# Patient Record
Sex: Male | Born: 1950
Health system: Southern US, Community
[De-identification: ages and names within clinical notes are randomized; demographics above are authoritative.]

## PROBLEM LIST (undated history)

## (undated) DIAGNOSIS — K612 Anorectal abscess: Secondary | ICD-10-CM

## (undated) DIAGNOSIS — K603 Anal fistula: Secondary | ICD-10-CM

## (undated) DIAGNOSIS — I4891 Unspecified atrial fibrillation: Secondary | ICD-10-CM

## (undated) DIAGNOSIS — I10 Essential (primary) hypertension: Secondary | ICD-10-CM

## (undated) DIAGNOSIS — H401130 Primary open-angle glaucoma, bilateral, stage unspecified: Secondary | ICD-10-CM

## (undated) DIAGNOSIS — C61 Malignant neoplasm of prostate: Secondary | ICD-10-CM

## (undated) DIAGNOSIS — J9383 Other pneumothorax: Secondary | ICD-10-CM

## (undated) DIAGNOSIS — I639 Cerebral infarction, unspecified: Secondary | ICD-10-CM

## (undated) DIAGNOSIS — K219 Gastro-esophageal reflux disease without esophagitis: Secondary | ICD-10-CM

## (undated) DIAGNOSIS — E119 Type 2 diabetes mellitus without complications: Secondary | ICD-10-CM

## (undated) HISTORY — DX: Primary open-angle glaucoma, bilateral, stage unspecified: H40.1130

## (undated) HISTORY — DX: Anal fistula: K60.3

## (undated) HISTORY — DX: Anorectal abscess: K61.2

## (undated) HISTORY — PX: PROSTATE BIOPSY: SHX241

## (undated) HISTORY — DX: Cerebral infarction, unspecified: I63.9

---

## 2000-08-11 ENCOUNTER — Emergency Department (HOSPITAL_COMMUNITY): Admission: EM | Admit: 2000-08-11 | Discharge: 2000-08-11 | Payer: Self-pay | Admitting: Emergency Medicine

## 2000-08-11 ENCOUNTER — Encounter: Payer: Self-pay | Admitting: Emergency Medicine

## 2003-08-20 ENCOUNTER — Emergency Department (HOSPITAL_COMMUNITY): Admission: EM | Admit: 2003-08-20 | Discharge: 2003-08-20 | Payer: Self-pay | Admitting: Family Medicine

## 2004-01-22 ENCOUNTER — Emergency Department (HOSPITAL_COMMUNITY): Admission: EM | Admit: 2004-01-22 | Discharge: 2004-01-22 | Payer: Self-pay | Admitting: Emergency Medicine

## 2004-02-27 ENCOUNTER — Inpatient Hospital Stay (HOSPITAL_COMMUNITY): Admission: EM | Admit: 2004-02-27 | Discharge: 2004-02-29 | Payer: Self-pay | Admitting: Emergency Medicine

## 2004-02-27 ENCOUNTER — Ambulatory Visit: Payer: Self-pay | Admitting: Internal Medicine

## 2004-02-28 ENCOUNTER — Encounter (INDEPENDENT_AMBULATORY_CARE_PROVIDER_SITE_OTHER): Payer: Self-pay | Admitting: *Deleted

## 2004-03-04 ENCOUNTER — Ambulatory Visit: Payer: Self-pay | Admitting: Internal Medicine

## 2004-03-15 ENCOUNTER — Emergency Department (HOSPITAL_COMMUNITY): Admission: EM | Admit: 2004-03-15 | Discharge: 2004-03-15 | Payer: Self-pay | Admitting: Emergency Medicine

## 2004-12-25 ENCOUNTER — Emergency Department (HOSPITAL_COMMUNITY): Admission: EM | Admit: 2004-12-25 | Discharge: 2004-12-25 | Payer: Self-pay | Admitting: Family Medicine

## 2005-05-16 ENCOUNTER — Emergency Department (HOSPITAL_COMMUNITY): Admission: AD | Admit: 2005-05-16 | Discharge: 2005-05-16 | Payer: Self-pay | Admitting: Family Medicine

## 2005-05-21 ENCOUNTER — Ambulatory Visit: Payer: Self-pay | Admitting: Internal Medicine

## 2005-05-21 ENCOUNTER — Inpatient Hospital Stay (HOSPITAL_COMMUNITY): Admission: EM | Admit: 2005-05-21 | Discharge: 2005-05-22 | Payer: Self-pay | Admitting: Emergency Medicine

## 2005-05-22 ENCOUNTER — Encounter (INDEPENDENT_AMBULATORY_CARE_PROVIDER_SITE_OTHER): Payer: Self-pay | Admitting: Cardiology

## 2005-10-04 ENCOUNTER — Emergency Department (HOSPITAL_COMMUNITY): Admission: EM | Admit: 2005-10-04 | Discharge: 2005-10-04 | Payer: Self-pay | Admitting: Family Medicine

## 2005-10-08 ENCOUNTER — Emergency Department (HOSPITAL_COMMUNITY): Admission: EM | Admit: 2005-10-08 | Discharge: 2005-10-08 | Payer: Self-pay | Admitting: Family Medicine

## 2006-05-27 ENCOUNTER — Encounter (INDEPENDENT_AMBULATORY_CARE_PROVIDER_SITE_OTHER): Payer: Self-pay | Admitting: Cardiology

## 2006-05-27 ENCOUNTER — Ambulatory Visit: Payer: Self-pay | Admitting: Hospitalist

## 2006-05-27 ENCOUNTER — Inpatient Hospital Stay (HOSPITAL_COMMUNITY): Admission: EM | Admit: 2006-05-27 | Discharge: 2006-05-30 | Payer: Self-pay | Admitting: Family Medicine

## 2006-06-08 DIAGNOSIS — I10 Essential (primary) hypertension: Secondary | ICD-10-CM

## 2006-06-08 DIAGNOSIS — F1410 Cocaine abuse, uncomplicated: Secondary | ICD-10-CM | POA: Insufficient documentation

## 2006-06-22 DIAGNOSIS — E119 Type 2 diabetes mellitus without complications: Secondary | ICD-10-CM

## 2006-06-22 HISTORY — DX: Type 2 diabetes mellitus without complications: E11.9

## 2006-07-03 DIAGNOSIS — E785 Hyperlipidemia, unspecified: Secondary | ICD-10-CM

## 2006-07-03 DIAGNOSIS — K449 Diaphragmatic hernia without obstruction or gangrene: Secondary | ICD-10-CM | POA: Insufficient documentation

## 2006-07-03 DIAGNOSIS — Z87891 Personal history of nicotine dependence: Secondary | ICD-10-CM

## 2006-07-03 DIAGNOSIS — E119 Type 2 diabetes mellitus without complications: Secondary | ICD-10-CM

## 2006-07-03 DIAGNOSIS — I4891 Unspecified atrial fibrillation: Secondary | ICD-10-CM | POA: Insufficient documentation

## 2006-07-21 ENCOUNTER — Telehealth: Payer: Self-pay | Admitting: *Deleted

## 2006-09-19 ENCOUNTER — Emergency Department (HOSPITAL_COMMUNITY): Admission: EM | Admit: 2006-09-19 | Discharge: 2006-09-19 | Payer: Self-pay | Admitting: Family Medicine

## 2006-09-28 ENCOUNTER — Ambulatory Visit: Payer: Self-pay | Admitting: Internal Medicine

## 2006-09-28 LAB — CONVERTED CEMR LAB
Cholesterol: 199 mg/dL
HDL: 42 mg/dL
Hgb A1c MFr Bld: 5.1 %
LDL Cholesterol: 110 mg/dL
Triglycerides: 234 mg/dL

## 2006-10-07 ENCOUNTER — Ambulatory Visit: Payer: Self-pay | Admitting: Internal Medicine

## 2006-11-16 ENCOUNTER — Ambulatory Visit: Payer: Self-pay | Admitting: Internal Medicine

## 2006-11-16 LAB — CONVERTED CEMR LAB
Creatinine, Ser: 1.18 mg/dL
Hemoglobin: 13.8 g/dL

## 2006-11-17 ENCOUNTER — Ambulatory Visit (HOSPITAL_COMMUNITY): Admission: RE | Admit: 2006-11-17 | Discharge: 2006-11-17 | Payer: Self-pay | Admitting: Internal Medicine

## 2006-11-19 ENCOUNTER — Ambulatory Visit: Payer: Self-pay | Admitting: Internal Medicine

## 2007-01-27 ENCOUNTER — Ambulatory Visit: Payer: Self-pay | Admitting: Internal Medicine

## 2007-01-27 DIAGNOSIS — K603 Anal fistula, unspecified: Secondary | ICD-10-CM

## 2007-01-27 HISTORY — DX: Anal fistula: K60.3

## 2007-01-27 HISTORY — DX: Anal fistula, unspecified: K60.30

## 2007-03-28 ENCOUNTER — Telehealth (INDEPENDENT_AMBULATORY_CARE_PROVIDER_SITE_OTHER): Payer: Self-pay | Admitting: Internal Medicine

## 2007-03-31 ENCOUNTER — Emergency Department (HOSPITAL_COMMUNITY): Admission: EM | Admit: 2007-03-31 | Discharge: 2007-03-31 | Payer: Self-pay | Admitting: Emergency Medicine

## 2007-04-01 ENCOUNTER — Telehealth (INDEPENDENT_AMBULATORY_CARE_PROVIDER_SITE_OTHER): Payer: Self-pay | Admitting: Internal Medicine

## 2007-04-18 ENCOUNTER — Telehealth (INDEPENDENT_AMBULATORY_CARE_PROVIDER_SITE_OTHER): Payer: Self-pay | Admitting: Internal Medicine

## 2007-04-28 ENCOUNTER — Telehealth (INDEPENDENT_AMBULATORY_CARE_PROVIDER_SITE_OTHER): Payer: Self-pay | Admitting: *Deleted

## 2007-05-10 ENCOUNTER — Ambulatory Visit: Payer: Self-pay | Admitting: Internal Medicine

## 2007-05-10 LAB — CONVERTED CEMR LAB
BUN: 15 mg/dL (ref 6–23)
CO2: 23 meq/L (ref 19–32)
Calcium: 9.2 mg/dL (ref 8.4–10.5)
Chloride: 107 meq/L (ref 96–112)
Creatinine, Ser: 1.11 mg/dL (ref 0.40–1.50)
Glucose, Bld: 120 mg/dL — ABNORMAL HIGH (ref 70–99)
Potassium: 4.2 meq/L (ref 3.5–5.3)
Sodium: 140 meq/L (ref 135–145)

## 2007-05-17 ENCOUNTER — Ambulatory Visit: Payer: Self-pay | Admitting: *Deleted

## 2007-06-12 ENCOUNTER — Telehealth (INDEPENDENT_AMBULATORY_CARE_PROVIDER_SITE_OTHER): Payer: Self-pay | Admitting: Internal Medicine

## 2007-06-30 ENCOUNTER — Telehealth (INDEPENDENT_AMBULATORY_CARE_PROVIDER_SITE_OTHER): Payer: Self-pay | Admitting: *Deleted

## 2007-06-30 ENCOUNTER — Encounter (INDEPENDENT_AMBULATORY_CARE_PROVIDER_SITE_OTHER): Payer: Self-pay | Admitting: Internal Medicine

## 2007-07-05 ENCOUNTER — Encounter (INDEPENDENT_AMBULATORY_CARE_PROVIDER_SITE_OTHER): Payer: Self-pay | Admitting: Internal Medicine

## 2007-07-05 ENCOUNTER — Encounter: Payer: Self-pay | Admitting: Internal Medicine

## 2007-07-15 ENCOUNTER — Ambulatory Visit: Payer: Self-pay | Admitting: Internal Medicine

## 2007-07-15 DIAGNOSIS — K612 Anorectal abscess: Secondary | ICD-10-CM

## 2007-07-15 HISTORY — DX: Anorectal abscess: K61.2

## 2007-07-19 ENCOUNTER — Ambulatory Visit: Payer: Self-pay | Admitting: Internal Medicine

## 2007-07-22 ENCOUNTER — Encounter (INDEPENDENT_AMBULATORY_CARE_PROVIDER_SITE_OTHER): Payer: Self-pay | Admitting: Internal Medicine

## 2007-07-27 ENCOUNTER — Encounter (INDEPENDENT_AMBULATORY_CARE_PROVIDER_SITE_OTHER): Payer: Self-pay | Admitting: Gastroenterology

## 2007-07-27 ENCOUNTER — Ambulatory Visit (HOSPITAL_COMMUNITY): Admission: RE | Admit: 2007-07-27 | Discharge: 2007-07-27 | Payer: Self-pay | Admitting: Gastroenterology

## 2007-07-27 ENCOUNTER — Encounter (INDEPENDENT_AMBULATORY_CARE_PROVIDER_SITE_OTHER): Payer: Self-pay | Admitting: Internal Medicine

## 2008-02-07 ENCOUNTER — Encounter (INDEPENDENT_AMBULATORY_CARE_PROVIDER_SITE_OTHER): Payer: Self-pay | Admitting: *Deleted

## 2008-03-05 ENCOUNTER — Telehealth (INDEPENDENT_AMBULATORY_CARE_PROVIDER_SITE_OTHER): Payer: Self-pay | Admitting: Internal Medicine

## 2008-04-07 ENCOUNTER — Inpatient Hospital Stay (HOSPITAL_COMMUNITY): Admission: EM | Admit: 2008-04-07 | Discharge: 2008-04-09 | Payer: Self-pay | Admitting: Emergency Medicine

## 2008-05-03 ENCOUNTER — Telehealth (INDEPENDENT_AMBULATORY_CARE_PROVIDER_SITE_OTHER): Payer: Self-pay | Admitting: Internal Medicine

## 2009-03-27 ENCOUNTER — Ambulatory Visit: Payer: Self-pay | Admitting: Physician Assistant

## 2009-03-27 LAB — CONVERTED CEMR LAB: Blood Glucose, Fingerstick: 125

## 2009-04-05 LAB — CONVERTED CEMR LAB
ALT: 11 units/L (ref 0–53)
AST: 12 units/L (ref 0–37)
Albumin: 4.2 g/dL (ref 3.5–5.2)
Alkaline Phosphatase: 68 units/L (ref 39–117)
BUN: 18 mg/dL (ref 6–23)
Basophils Absolute: 0 10*3/uL (ref 0.0–0.1)
Basophils Relative: 1 % (ref 0–1)
CO2: 23 meq/L (ref 19–32)
Calcium: 9 mg/dL (ref 8.4–10.5)
Chloride: 105 meq/L (ref 96–112)
Cholesterol: 192 mg/dL (ref 0–200)
Creatinine, Ser: 1.22 mg/dL (ref 0.40–1.50)
Eosinophils Absolute: 0.1 10*3/uL (ref 0.0–0.7)
Eosinophils Relative: 2 % (ref 0–5)
Glucose, Bld: 128 mg/dL — ABNORMAL HIGH (ref 70–99)
HCT: 41.8 % (ref 39.0–52.0)
HDL: 46 mg/dL (ref >39)
Hemoglobin: 13.9 g/dL (ref 13.0–17.0)
Hgb A1c MFr Bld: 6.3 % — ABNORMAL HIGH (ref 4.6–6.1)
LDL Cholesterol: 103 mg/dL — ABNORMAL HIGH (ref 0–99)
Lymphocytes Relative: 33 % (ref 12–46)
Lymphs Abs: 1.7 10*3/uL (ref 0.7–4.0)
MCHC: 33.3 g/dL (ref 30.0–36.0)
MCV: 88.4 fL (ref 78.0–100.0)
Monocytes Absolute: 0.3 10*3/uL (ref 0.1–1.0)
Monocytes Relative: 7 % (ref 3–12)
Neutro Abs: 3 10*3/uL (ref 1.7–7.7)
Neutrophils Relative %: 58 % (ref 43–77)
Platelets: 261 10*3/uL (ref 150–400)
Potassium: 4.4 meq/L (ref 3.5–5.3)
RBC: 4.73 M/uL (ref 4.22–5.81)
RDW: 14.3 % (ref 11.5–15.5)
Sodium: 140 meq/L (ref 135–145)
TSH: 3.037 microintl units/mL (ref 0.350–4.500)
Total Bilirubin: 0.5 mg/dL (ref 0.3–1.2)
Total CHOL/HDL Ratio: 4.2
Total Protein: 7.4 g/dL (ref 6.0–8.3)
Triglycerides: 215 mg/dL — ABNORMAL HIGH (ref <150)
VLDL: 43 mg/dL — ABNORMAL HIGH (ref 0–40)
WBC: 5.2 10*3/uL (ref 4.0–10.5)

## 2009-04-11 ENCOUNTER — Ambulatory Visit: Payer: Self-pay | Admitting: Internal Medicine

## 2009-04-11 DIAGNOSIS — M542 Cervicalgia: Secondary | ICD-10-CM | POA: Insufficient documentation

## 2009-04-16 ENCOUNTER — Ambulatory Visit: Payer: Self-pay | Admitting: Internal Medicine

## 2009-04-19 ENCOUNTER — Ambulatory Visit (HOSPITAL_COMMUNITY): Admission: RE | Admit: 2009-04-19 | Discharge: 2009-04-19 | Payer: Self-pay | Admitting: Internal Medicine

## 2009-04-19 ENCOUNTER — Emergency Department (HOSPITAL_COMMUNITY): Admission: EM | Admit: 2009-04-19 | Discharge: 2009-04-19 | Payer: Self-pay | Admitting: Emergency Medicine

## 2009-04-22 ENCOUNTER — Emergency Department (HOSPITAL_COMMUNITY): Admission: EM | Admit: 2009-04-22 | Discharge: 2009-04-22 | Payer: Self-pay | Admitting: Family Medicine

## 2009-04-24 ENCOUNTER — Ambulatory Visit: Payer: Self-pay | Admitting: Internal Medicine

## 2009-04-24 DIAGNOSIS — M479 Spondylosis, unspecified: Secondary | ICD-10-CM | POA: Insufficient documentation

## 2009-04-24 LAB — CONVERTED CEMR LAB
BUN: 16 mg/dL (ref 6–23)
CO2: 23 meq/L (ref 19–32)
Calcium: 9.2 mg/dL (ref 8.4–10.5)
Chloride: 101 meq/L (ref 96–112)
Creatinine, Ser: 1.23 mg/dL (ref 0.40–1.50)
Glucose, Bld: 124 mg/dL — ABNORMAL HIGH (ref 70–99)
Potassium: 4.5 meq/L (ref 3.5–5.3)
Sodium: 137 meq/L (ref 135–145)

## 2009-05-06 ENCOUNTER — Ambulatory Visit (HOSPITAL_COMMUNITY): Admission: RE | Admit: 2009-05-06 | Discharge: 2009-05-06 | Payer: Self-pay | Admitting: Specialist

## 2009-05-09 ENCOUNTER — Ambulatory Visit: Payer: Self-pay | Admitting: Internal Medicine

## 2009-06-04 ENCOUNTER — Encounter (INDEPENDENT_AMBULATORY_CARE_PROVIDER_SITE_OTHER): Payer: Self-pay | Admitting: Internal Medicine

## 2009-07-16 ENCOUNTER — Telehealth: Payer: Self-pay | Admitting: Physician Assistant

## 2009-09-19 ENCOUNTER — Emergency Department (HOSPITAL_COMMUNITY): Admission: EM | Admit: 2009-09-19 | Discharge: 2009-09-19 | Payer: Self-pay | Admitting: Emergency Medicine

## 2009-12-27 ENCOUNTER — Emergency Department (HOSPITAL_COMMUNITY): Admission: EM | Admit: 2009-12-27 | Discharge: 2009-12-27 | Payer: Self-pay | Admitting: Family Medicine

## 2010-03-07 ENCOUNTER — Ambulatory Visit: Payer: Self-pay | Admitting: Internal Medicine

## 2010-03-07 DIAGNOSIS — R079 Chest pain, unspecified: Secondary | ICD-10-CM

## 2010-07-17 ENCOUNTER — Ambulatory Visit
Admission: RE | Admit: 2010-07-17 | Discharge: 2010-07-17 | Payer: Self-pay | Source: Home / Self Care | Attending: Internal Medicine | Admitting: Internal Medicine

## 2010-07-17 DIAGNOSIS — N529 Male erectile dysfunction, unspecified: Secondary | ICD-10-CM | POA: Insufficient documentation

## 2010-07-17 DIAGNOSIS — R109 Unspecified abdominal pain: Secondary | ICD-10-CM | POA: Insufficient documentation

## 2010-07-17 LAB — CONVERTED CEMR LAB
Blood Glucose, Fingerstick: 145
Hgb A1c MFr Bld: 6.4 %

## 2010-07-24 NOTE — Assessment & Plan Note (Signed)
Summary: NECK PAIN ,BP CK & MEDS  / NS   Vital Signs:  Patient profile:   60 year old male Height:      71 inches Weight:      229.3 pounds BMI:     32.10 Temp:     98.4 degrees F oral Pulse rate:   80 / minute Pulse rhythm:   regular Resp:     18 per minute BP sitting:   160 / 108  (left arm) Cuff size:   regular  Vitals Entered By: Michelle Nasuti (March 07, 2010 2:32 PM) CC: htn follow up. med refills needed w/o meds x 6 months. c/o neck pain and chest tightness Pain Assessment Patient in pain? yes      Intensity: 4  Does patient need assistance? Ambulation Normal   CC:  htn follow up. med refills needed w/o meds x 6 months. c/o neck pain and chest tightness.  History of Present Illness: 1. Chest Discomfort:  Noted starting 1 month ago.  Left anterolateral chest area.  Sometimes a grabbing discomfort--that comes and goes, but always has some mild discomfort there that he has difficulty describing.  Almost like he cannot fully expand his lung on that side.  Not a sharp pain with breathing, just cannot seem to expand fully.  When exerts himself,  the constant sense of not fully expanding chest worsens.  The more intense, grabbing sensation comes and goes without association to activity. Grabbing pain occurs 10 times weekly--can last 1 minute intensely, then gradually dissepates over next 3-5 minutes Pt. had a normal cardiac catheterization in 2007.  Has had terrible heartburn and acid taste in mouth for some time since out of Protonix--using Prilosec otc without great control, though has helped a bit.  2.  Hypertension:  Has been off medication for 10 months--orange card expired.  Not using cocaine.  Has worked his way down to one cigarette a day--the latter for the past 5 months.    3.  DM:  sugars running in low 100s fasting.  Checks sugars 3 times weekly.  Habits & Providers  Alcohol-Tobacco-Diet     Tobacco Status: current     Cigarette Packs/Day: <0.25  Allergies  (verified): No Known Drug Allergies  Social History: Packs/Day:  <0.25  Physical Exam  Neck:  No deformities, masses, or tenderness noted. Chest Wall:  Dry oval patch of skin on lateral posterior left chest wall. NT on compression of rib cage in area of discomfort and on palpation of left chest wall. Lungs:  Normal respiratory effort, chest expands symmetrically. Lungs are clear to auscultation, no crackles or wheezes. Heart:  Normal rate and regular rhythm. S1 and S2 normal without gallop, murmur, click, rub or other extra sounds. Abdomen:  Bowel sounds positive,abdomen soft and non-tender without masses, organomegaly or hernias noted.   Impression & Recommendations:  Problem # 1:  CHEST PAIN (ICD-786.50) Suspect related To GERD as other reflux symptoms quite prominent Orders: EKG w/ Interpretation (93000) No ischemic changes--prolonged QT on no meds. Diagnostic X-Ray/Fluoroscopy (Diagnostic X-Ray/Flu)  Problem # 2:  HYPERTENSION (ICD-401.9) Poorly controlled--restart meds His updated medication list for this problem includes:    Lisinopril-hydrochlorothiazide 20-12.5 Mg Tabs (Lisinopril-hydrochlorothiazide) .Marland Kitchen... Take 1 tablet by mouth once a day    Amlodipine Besylate 5 Mg Tabs (Amlodipine besylate) .Marland Kitchen... 1 tab by mouth daily  Problem # 3:  DIABETES MELLITUS, TYPE II (ICD-250.00)  Check A1C His updated medication list for this problem includes:    Aspirin 81  Mg Tbec (Aspirin) .Marland Kitchen... Take 1 tablet by mouth once a day    Lisinopril-hydrochlorothiazide 20-12.5 Mg Tabs (Lisinopril-hydrochlorothiazide) .Marland Kitchen... Take 1 tablet by mouth once a day  Orders: Hgb A1C (04540JW)  Problem # 4:  HIATAL HERNIA WITH REFLUX (ICD-553.3) Restart Protonix  Complete Medication List: 1)  Aspirin 81 Mg Tbec (Aspirin) .... Take 1 tablet by mouth once a day 2)  Protonix 40 Mg Tbec (Pantoprazole sodium) .... Take 1 tablet by mouth two times a day 3)  Nitroquick 0.4 Mg Subl (Nitroglycerin) ....  Place one pill under the tongue as needed for chest pain 4)  Cyanocobalamin 1000 Mcg/ml Soln (Cyanocobalamin) .... Inject 1000 micrograms intramuscularly once daily for 7 days, then once weekly for 7 weeks, then once a month 5)  Hydrocortisone Acetate 25 Mg Supp (Hydrocortisone acetate) .Marland Kitchen.. 1 supp per rectum two times a day. 6)  Lisinopril-hydrochlorothiazide 20-12.5 Mg Tabs (Lisinopril-hydrochlorothiazide) .... Take 1 tablet by mouth once a day 7)  Simvastatin 20 Mg Tabs (Simvastatin) .Marland Kitchen.. 1 tab by mouth daily 8)  Amlodipine Besylate 5 Mg Tabs (Amlodipine besylate) .Marland Kitchen.. 1 tab by mouth daily 9)  Famotidine 20 Mg Tabs (Famotidine) .... Take 2 by mouth two times a day for 1 week, then decrease to 1 by mouth two times a day.  Patient Instructions: 1)  Nurse visit in 1 week for bp check--restarting bp meds. 2)  Follow up with Dr. Delrae Alfred in 1 month --chest discomfort 3)  To ED if worsening chest discomfort Prescriptions: LISINOPRIL-HYDROCHLOROTHIAZIDE 20-12.5 MG TABS (LISINOPRIL-HYDROCHLOROTHIAZIDE) Take 1 tablet by mouth once a day  #30 x 11   Entered and Authorized by:   Julieanne Manson MD   Signed by:   Julieanne Manson MD on 03/07/2010   Method used:   Print then Give to Patient   RxID:   [id number] AMLODIPINE BESYLATE 5 MG TABS (AMLODIPINE BESYLATE) 1 tab by mouth daily  #30 x 11   Entered and Authorized by:   Julieanne Manson MD   Signed by:   Julieanne Manson MD on 03/07/2010   Method used:   Print then Give to Patient   RxID:   [id number] SIMVASTATIN 20 MG TABS (SIMVASTATIN) 1 tab by mouth daily  #30 x 6   Entered and Authorized by:   Julieanne Manson MD   Signed by:   Julieanne Manson MD on 03/07/2010   Method used:   Print then Give to Patient   RxID:   [id number] PROTONIX 40 MG TBEC (PANTOPRAZOLE SODIUM) Take 1 tablet by mouth two times a day  #60 x 11   Entered and Authorized by:   Julieanne Manson MD   Signed by:   Julieanne Manson MD on  03/07/2010   Method used:   Print then Give to Patient   RxID:   [id number]   Appended Document: NECK PAIN ,BP CK & MEDS  / NS in house a1c did not run. pt cannot return today, however does have follow up x one week  Appended Document: NECK PAIN ,BP CK & MEDS  / NS please make sure the A1C gets added to Nurse visit in 1 week.

## 2010-07-24 NOTE — Progress Notes (Signed)
Summary: CANT GET REFLUX MEDS UNTIL 4-6 WEEKS OR SO  Phone Note Call from Patient Call back at [facility] Phone [phone] Call back at [phone]   Summary of Call: According to the pt, the [facility] don't have efesor (acid reflux medication ) for about 4-6 weeks and as result he needs a supplement medication until then. [place] MD  Initial call taken by: Manon Hilding,  July 16, 2009 3:58 PM  Follow-up for Phone Call        MR MARSHALLS WIF CALLED BACK TO SEE IF HE CAN GET SOMETHING FOR HIS ACID REFLUX UNTIL HE GETS HIS MEDICINE FROM THE [facility] PROGRAM. AT [facility]. Follow-up by: Leodis Rains,  July 16, 2009 4:59 PM  Additional Follow-up for Phone Call Additional follow up Details #1::        Change to pepcid 20 mg. He can take 2 tabs by mouth two times a day for the first week, then decrease to 1 by mouth two times a day. Additional Follow-up by: Tereso Newcomer PA-C,  July 18, 2009 8:15 AM    Additional Follow-up for Phone Call Additional follow up Details #2::    pharmacy recieved....... Follow-up by: Mikey College CMA,  July 18, 2009 4:28 PM  New/Updated Medications: FAMOTIDINE 20 MG TABS (FAMOTIDINE) Take 2 by mouth two times a day for 1 week, then decrease to 1 by mouth two times a day. Prescriptions: FAMOTIDINE 20 MG TABS (FAMOTIDINE) Take 2 by mouth two times a day for 1 week, then decrease to 1 by mouth two times a day.  #60 x 3   Entered by:   Tereso Newcomer PA-C   Authorized by:   Vesta Mixer CMA   Signed by:   Tereso Newcomer PA-C on 07/18/2009   Method used:   Faxed to ...       [facility] (retail)       [address] [place], [place]  [address]       Ph: [phone]       Fax: [phone]   RxID:   [id number]

## 2010-07-27 ENCOUNTER — Emergency Department (HOSPITAL_COMMUNITY): Payer: Self-pay

## 2010-07-27 ENCOUNTER — Emergency Department (HOSPITAL_COMMUNITY)
Admission: EM | Admit: 2010-07-27 | Discharge: 2010-07-27 | Disposition: A | Discharge status: Home / Self Care | Payer: Self-pay | Attending: Emergency Medicine | Admitting: Emergency Medicine

## 2010-07-27 DIAGNOSIS — I10 Essential (primary) hypertension: Secondary | ICD-10-CM | POA: Insufficient documentation

## 2010-07-27 DIAGNOSIS — E119 Type 2 diabetes mellitus without complications: Secondary | ICD-10-CM | POA: Insufficient documentation

## 2010-07-27 DIAGNOSIS — I4891 Unspecified atrial fibrillation: Secondary | ICD-10-CM | POA: Insufficient documentation

## 2010-07-27 DIAGNOSIS — R066 Hiccough: Secondary | ICD-10-CM | POA: Insufficient documentation

## 2010-07-27 DIAGNOSIS — R059 Cough, unspecified: Secondary | ICD-10-CM | POA: Insufficient documentation

## 2010-07-27 DIAGNOSIS — K5090 Crohn's disease, unspecified, without complications: Secondary | ICD-10-CM | POA: Insufficient documentation

## 2010-07-27 DIAGNOSIS — R05 Cough: Secondary | ICD-10-CM | POA: Insufficient documentation

## 2010-08-19 NOTE — Assessment & Plan Note (Signed)
Summary: INJURY IN PRIVATE AREA//KT   Vital Signs:  Patient profile:   60 year old male Weight:      231.13 pounds Temp:     97.4 degrees F oral Pulse rate:   76 / minute Pulse rhythm:   regular Resp:     20 per minute BP sitting:   158 / 110  (left arm) Cuff size:   regular  Vitals Entered By: Hale Drone CMA (July 17, 2010 10:35 AM) CC: Pt. injured groin area 3 months ago lifting up some pews at his church. Also strained his groin area last week picking up firewood. Since then, has not been able to get an eraction and is concerned. Does not bother him walkig but doing any heavy physical activity.  Is Patient Diabetic? Yes Pain Assessment Patient in pain? no      CBG Result 145 CBG Device ID A non fasting  Does patient need assistance? Functional Status Self care Ambulation Normal   CC:  Pt. injured groin area 3 months ago lifting up some pews at his church. Also strained his groin area last week picking up firewood. Since then and has not been able to get an eraction and is concerned. Does not bother him walkig but doing any heavy physical activity. .  History of Present Illness: 1.  As above.  Hurts in bilateral groin area since the   Wife, who is a patient here and pt. were evaluated for syphilis when she had a positive test.  Pt. tested at [facility] and tested negative.  Both, however were treated.    They have worked out their concerns with this, but pt. unable to have an erection since above injury, which makes his wife concerned he does not not want to be with her.  The injury occured before the syphilis testing above--pt. states he was having ED symptoms before the concern for the testing.  Pt. has not noted any bulging when he lifts anything heavy.  Does feel like some gurgling or gas in the groin area bilaterally.  Wants Viagra  2.  Hypertension:  Just got started on bp meds about 3 weeks ago through MAP.  3.  DM:  150-180, depending on if fasting.    Current  Medications (verified): 1)  Aspirin 81 Mg Tbec (Aspirin) .... Take 1 Tablet By Mouth Once A Day 2)  Protonix 40 Mg Tbec (Pantoprazole Sodium) .... Take 1 Tablet By Mouth Two Times A Day 3)  Nitroquick 0.4 Mg Subl (Nitroglycerin) .... Place One Pill Under The Tongue As Needed For Chest Pain 4)  Cyanocobalamin 1000 Mcg/ml Soln (Cyanocobalamin) .... Inject 1000 Micrograms Intramuscularly Once Daily For 7 Days, Then Once Weekly For 7 Weeks, Then Once A Month 5)  Hydrocortisone Acetate 25 Mg  Supp (Hydrocortisone Acetate) .Marland Kitchen.. 1 Supp Per Rectum Two Times A Day. 6)  Lisinopril-Hydrochlorothiazide 20-12.5 Mg Tabs (Lisinopril-Hydrochlorothiazide) .... Take 1 Tablet By Mouth Once A Day 7)  Simvastatin 20 Mg Tabs (Simvastatin) .Marland Kitchen.. 1 Tab By Mouth Daily 8)  Amlodipine Besylate 5 Mg Tabs (Amlodipine Besylate) .Marland Kitchen.. 1 Tab By Mouth Daily 9)  Famotidine 20 Mg Tabs (Famotidine) .... Take 2 By Mouth Two Times A Day For 1 Week, Then Decrease To 1 By Mouth Two Times A Day.  Allergies (verified): No Known Drug Allergies  Physical Exam  General:  NAD Lungs:  Normal respiratory effort, chest expands symmetrically. Lungs are clear to auscultation, no crackles or wheezes. Heart:  Normal rate and regular rhythm. S1  and S2 normal without gallop, murmur, click, rub or other extra sounds. Abdomen:  normal bowel sounds.   Genitalia:  No palpable inguinal hernia or other abnormaility in groin area.  Testes descended and nontender bilaterally  Diabetes Management Exam:    Foot Exam (with socks and/or shoes not present):       Sensory-Monofilament:          Left foot: normal          Right foot: normal   Impression & Recommendations:  Problem # 1:  INGUINAL PAIN, BILATERAL (ICD-789.09) No findings of hernias--pt. to avoid heavy lifting and allow muscles to heal. If notes bulging or worsening of pain, to call or be seen. His updated medication list for this problem includes:    Aspirin 81 Mg Tbec (Aspirin) .Marland Kitchen...  Take 1 tablet by mouth once a day  Problem # 2:  ERECTILE DYSFUNCTION, ORGANIC (ICD-607.84) Hold on considering Viagra, which discussed with pt. he would likely need to pay for out of pocket, until bp controlled. Discussed concerns of using Viagra with NTG as well.  He has not used in over 6 months--cannot recall last use of ntg  Problem # 3:  HYPERTENSION (ICD-401.9) Hx on noncompliance--increase Amlodipine to 10 mg His updated medication list for this problem includes:    Lisinopril-hydrochlorothiazide 20-12.5 Mg Tabs (Lisinopril-hydrochlorothiazide) .Marland Kitchen... Take 1 tablet by mouth once a day    Amlodipine Besylate 10 Mg Tabs (Amlodipine besylate) .Marland Kitchen... 1 tab by mouth daily  Problem # 4:  DIABETES MELLITUS, TYPE II (ICD-250.00) Controlled with diet. Flu and Pneumovax today His updated medication list for this problem includes:    Aspirin 81 Mg Tbec (Aspirin) .Marland Kitchen... Take 1 tablet by mouth once a day    Lisinopril-hydrochlorothiazide 20-12.5 Mg Tabs (Lisinopril-hydrochlorothiazide) .Marland Kitchen... Take 1 tablet by mouth once a day  Orders: Capillary Blood Glucose/CBG (82948) Hgb A1C (16109UE)  Complete Medication List: 1)  Aspirin 81 Mg Tbec (Aspirin) .... Take 1 tablet by mouth once a day 2)  Protonix 40 Mg Tbec (Pantoprazole sodium) .... Take 1 tablet by mouth two times a day 3)  Nitroquick 0.4 Mg Subl (Nitroglycerin) .... Place one pill under the tongue as needed for chest pain 4)  Cyanocobalamin 1000 Mcg/ml Soln (Cyanocobalamin) .... Inject 1000 micrograms intramuscularly once daily for 7 days, then once weekly for 7 weeks, then once a month 5)  Hydrocortisone Acetate 25 Mg Supp (Hydrocortisone acetate) .Marland Kitchen.. 1 supp per rectum two times a day. 6)  Lisinopril-hydrochlorothiazide 20-12.5 Mg Tabs (Lisinopril-hydrochlorothiazide) .... Take 1 tablet by mouth once a day 7)  Simvastatin 20 Mg Tabs (Simvastatin) .Marland Kitchen.. 1 tab by mouth daily 8)  Amlodipine Besylate 10 Mg Tabs (Amlodipine besylate) .Marland Kitchen.. 1 tab  by mouth daily  Other Orders: Pneumococcal Vaccine (45409) Admin 1st Vaccine (81191) Flu Vaccine 95yrs + (47829) Admin of Any Addtl Vaccine (56213)  Patient Instructions: 1)  =Follow up with Dr. Delrae Alfred in 6 weeks--bp Prescriptions: AMLODIPINE BESYLATE 10 MG TABS (AMLODIPINE BESYLATE) 1 tab by mouth daily  #90 x 3   Entered and Authorized by:   Julieanne Manson MD   Signed by:   Julieanne Manson MD on 07/17/2010   Method used:   Print then Give to Patient   RxID:   [id number]    Orders Added: 1)  Capillary Blood Glucose/CBG [82948] 2)  Hgb A1C [83036QW] 3)  Est. Patient Level IV [13244] 4)  Pneumococcal Vaccine [90732] 5)  Admin 1st Vaccine [90471] 6)  Flu Vaccine 32yrs + [  90658] 7)  Admin of Any Addtl Vaccine [90472]   Immunizations Administered:  Pneumonia Vaccine:    Vaccine Type: Pneumovax    Site: left deltoid    Mfr: Merck    Dose: 0.5 ml    Route: IM    Given by: Hale Drone CMA    Exp. Date: 11/01/2011    Lot #: [id number]    VIS given: 05/27/09 version given July 17, 2010.  Influenza Vaccine # 1:    Vaccine Type: Fluvax 3+    Site: left deltoid    Mfr: GlaxoSmithKline    Dose: 0.5 ml    Route: IM    Given by: Hale Drone CMA    Exp. Date: 12/20/2010    Lot #: [id number]    VIS given: 01/14/10 version given July 17, 2010.  Flu Vaccine Consent Questions:    Do you have a history of severe allergic reactions to this vaccine? no    Any prior history of allergic reactions to egg and/or gelatin? no    Do you have a sensitivity to the preservative Thimersol? no    Do you have a past history of Guillan-Barre Syndrome? no    Do you currently have an acute febrile illness? no    Have you ever had a severe reaction to latex? no    Vaccine information given and explained to patient? yes   Immunizations Administered:  Pneumonia Vaccine:    Vaccine Type: Pneumovax    Site: left deltoid    Mfr: Merck    Dose: 0.5 ml    Route: IM    Given by:  Hale Drone CMA    Exp. Date: 11/01/2011    Lot #: [id number]    VIS given: 05/27/09 version given July 17, 2010.  Influenza Vaccine # 1:    Vaccine Type: Fluvax 3+    Site: left deltoid    Mfr: GlaxoSmithKline    Dose: 0.5 ml    Route: IM    Given by: Hale Drone CMA    Exp. Date: 12/20/2010    Lot #: [id number]    VIS given: 01/14/10 version given July 17, 2010.    Diabetic Foot Exam    10-g (5.07) Semmes-Weinstein Monofilament Test Performed by: Hale Drone CMA          Right Foot          Left Foot Visual Inspection     normal         normal Test Control      normal         normal Site 1         normal         normal Site 2         normal         normal Site 3         normal         normal Site 4         normal         normal Site 5         normal         normal Site 6         normal         normal Site 7         normal         normal Site 8         normal  normal Site 9         normal         normal Site 10         normal         normal  Impression      normal         normal  Laboratory Results   Blood Tests   Date/Time Received: July 17, 2010 11:00 AM   HGBA1C: 6.4%   (Normal Range: Non-Diabetic - 3-6%   Control Diabetic - 6-8%) CBG Random:: 145mg /dL

## 2010-08-21 ENCOUNTER — Telehealth (INDEPENDENT_AMBULATORY_CARE_PROVIDER_SITE_OTHER): Payer: Self-pay | Admitting: Internal Medicine

## 2010-08-28 ENCOUNTER — Telehealth (INDEPENDENT_AMBULATORY_CARE_PROVIDER_SITE_OTHER): Payer: Self-pay | Admitting: Internal Medicine

## 2010-09-01 ENCOUNTER — Telehealth (INDEPENDENT_AMBULATORY_CARE_PROVIDER_SITE_OTHER): Payer: Self-pay | Admitting: Internal Medicine

## 2010-09-02 ENCOUNTER — Encounter (INDEPENDENT_AMBULATORY_CARE_PROVIDER_SITE_OTHER): Payer: Self-pay | Admitting: Internal Medicine

## 2010-09-02 ENCOUNTER — Telehealth (INDEPENDENT_AMBULATORY_CARE_PROVIDER_SITE_OTHER): Payer: Self-pay | Admitting: Internal Medicine

## 2010-09-02 ENCOUNTER — Encounter: Payer: Self-pay | Admitting: Internal Medicine

## 2010-09-02 DIAGNOSIS — K5289 Other specified noninfective gastroenteritis and colitis: Secondary | ICD-10-CM | POA: Insufficient documentation

## 2010-09-02 NOTE — Progress Notes (Signed)
Summary: concerned of bp being high  Phone Note Call from Patient   Reason for Call: Talk to Nurse Summary of Call: BP high- out of all medications Initial call taken by: Nicholaus Bloom,  August 28, 2010 9:18 AM  Follow-up for Phone Call        Pt. advised by [facility] that he can't get meds there anymore, due to being served by [facility].  [facility] states they have no record of refills done 02/2010.  [facility] records show card expired - pt. advised.  Pt. states card renewed at [facility].  Will bring orange card to pharmacy today to confirm.  Advised that we will have meds filled after they see card.  Meds will be filled at [facility] pharmacy - per Olegario Messier at [facility], pt. has not had any meds filled there.   Verbalized understanding and agreement.   Follow-up by: Dutch Quint RN,  August 28, 2010 9:38 AM  Additional Follow-up for Phone Call Additional follow up Details #1::        [facility] unable to get meds transferred from [facility] since they have no record of receiving refill orders.  Need new Rx for lisinopril-HCTZ, protonix, simvastatin as soon as possible.  Dutch Quint RN  August 28, 2010 5:00 PM      Additional Follow-up for Phone Call Additional follow up Details #2::    Meds sent to [facility] pharmacy. pt needs to bring card in before getting Follow-up by: Lehman Prom FNP,  August 28, 2010 5:29 PM  Additional Follow-up for Phone Call Additional follow up Details #3:: Details for Additional Follow-up Action Taken: Per [facility], pt. has provided needed information.  Pt. notified that Rxs are ready for pickup.  Dutch Quint RN  August 29, 2010 12:31 PM   Prescriptions: PROTONIX 40 MG TBEC (PANTOPRAZOLE SODIUM) Take 1 tablet by mouth two times a day  #60 x 3   Entered and Authorized by:   Lehman Prom FNP   Signed by:   Lehman Prom FNP on 08/28/2010   Method used:   Faxed to ...       [facility] (retail)       [address] [place], [place]   [address]       Ph: [phone]       Fax: [phone]   RxID:   [id number] LISINOPRIL-HYDROCHLOROTHIAZIDE 20-12.5 MG TABS (LISINOPRIL-HYDROCHLOROTHIAZIDE) Take 1 tablet by mouth once a day  #30 x 3   Entered and Authorized by:   Lehman Prom FNP   Signed by:   Lehman Prom FNP on 08/28/2010   Method used:   Faxed to ...       [facility] (retail)       [address] [place], [place]  [address]       Ph: [phone]       Fax: [phone]   RxID:   [id number] SIMVASTATIN 20 MG TABS (SIMVASTATIN) 1 tab by mouth daily  #30 x 3   Entered and Authorized by:   Lehman Prom FNP   Signed by:   Lehman Prom FNP on 08/28/2010   Method used:   Faxed to ...       [facility] - [facility] (retail)       [address] [place], [place]  [address]       Ph: [phone]       Fax: [phone]   RxID:   [id number] AMLODIPINE BESYLATE 10 MG TABS (AMLODIPINE BESYLATE) 1 tab by mouth daily  #90 x 3   Entered and Authorized by:   Lehman Prom FNP   Signed by:   Lehman Prom FNP on 08/28/2010   Method used:   Faxed to ...       [facility] - [facility] (retail)       [address] [place], [place]  [address]       Ph: [phone]       Fax: [phone]   RxID:   [id number]

## 2010-09-02 NOTE — Progress Notes (Signed)
Summary: RX REFILL   Phone Note Refill Request   Refills Requested: Medication #1:  LISINOPRIL-HYDROCHLOROTHIAZIDE 20-12.5 MG TABS Take 1 tablet by mouth once a day  Medication #2:  SIMVASTATIN 20 MG TABS 1 tab by mouth daily  Medication #3:  AMLODIPINE BESYLATE 10 MG TABS 1 tab by mouth daily.  Medication #4:  PROTONIX 40 MG TBEC Take 1 tablet by mouth two times a day [facility], CALL PT [phone] [facility]   Initial call taken by: Cheryll Dessert,  August 21, 2010 11:16 AM  Follow-up for Phone Call        patient called again to see when his refills will be sent in. Follow-up by: Leodis Rains,  August 25, 2010 2:35 PM  Additional Follow-up for Phone Call Additional follow up Details #1::        Spoke with pt.  Advised that Rx for lisinopril-HCTZ still has refills available -- he states [facility] pharmacy does not have record of refill.  I will call [facility] pharmacy tomorrow.  Simvastatin refill completed and faxed to [facility].  Advised pt. that records reflect amlodipine sent to [facility] pharmacy.  He will call there tomorrow.   He states he needs a substitute for the protonix -- [facility] program will take weeks to get it in -- not currently available.   Additional Follow-up by: Dutch Quint RN,  August 25, 2010 5:30 PM    Additional Follow-up for Phone Call Additional follow up Details #2::    I spoke with [facility] re meds -- pt. has never picked up meds he is looking for, according to their records.  Has not been part of their MAP program.  Advised that refills went re protonix, lisinopril-HCTZ and simvastatin sent 03/08/11 -- states they have no record of them having received.  Cathy/Chris (?) at [facility] states she will call pt. and go over meds and see where the problem is.  Dutch Quint RN  August 27, 2010 10:26 AM  Call received from [facility] -- pt. needs to get meds here.  Pt. advised.  Dutch Quint RN  August 28, 2010 4:59 PM   Prescriptions: SIMVASTATIN 20 MG TABS (SIMVASTATIN) 1 tab by  mouth daily  #30 x 3   Entered by:   Dutch Quint RN   Authorized by:   Julieanne Manson MD   Signed by:   Dutch Quint RN on 08/25/2010   Method used:   Printed then faxed to ...       [facility] (retail)       [address] [place], [place]  [address]       Ph: [phone]       Fax: [phone]   RxID:   [id number]

## 2010-09-04 LAB — CONVERTED CEMR LAB
BUN: 25 mg/dL — ABNORMAL HIGH (ref 6–23)
Calcium: 9 mg/dL (ref 8.4–10.5)
Lymphocytes Relative: 35 % (ref 12–46)
Lymphs Abs: 1.8 10*3/uL (ref 0.7–4.0)
MCV: 89.3 fL (ref 78.0–100.0)
Monocytes Relative: 10 % (ref 3–12)
Neutro Abs: 2.7 10*3/uL (ref 1.7–7.7)
Neutrophils Relative %: 53 % (ref 43–77)
Potassium: 3.8 meq/L (ref 3.5–5.3)
RBC: 4.66 M/uL (ref 4.22–5.81)
Sed Rate: 12 mm/hr (ref 0–16)
WBC: 5.1 10*3/uL (ref 4.0–10.5)

## 2010-09-09 NOTE — Progress Notes (Signed)
  Phone Note From Pharmacy   Caller: Gershon Cull Summary of Call: Dr. Delrae Alfred... Gershon Cull wants to know if you can refill pt's Norvasc 5mg , Lisinopril/HCTZ 10-12, Pravastatin 40mg , and Aciphex 20mg .... Thank you. .... Hale Drone CMA  September 01, 2010 9:38 AM   Follow-up for Phone Call        Everything was filled last week--see note from 08/28/10--check refills with requests--often times already done.  Please let Gershon Cull know. Follow-up by: Julieanne Manson MD,  September 01, 2010 9:52 AM  Additional Follow-up for Phone Call Additional follow up Details #1::        He picked up meds Additional Follow-up by: Julieanne Manson MD,  September 02, 2010 1:39 PM

## 2010-09-09 NOTE — Assessment & Plan Note (Signed)
Summary: Diarrhea, fever   Vital Signs:  Patient profile:   60 year old male Weight:      219.06 pounds Temp:     97.8 degrees F oral Pulse rate:   98 / minute Pulse rhythm:   regular Resp:     20 per minute BP sitting:   151 / 78  (left arm) Cuff size:   regular  Vitals Entered By: Hale Drone CMA (September 02, 2010 12:30 PM) CC: Began w/diarrhea last Thursday and has been having it since then. On a clear diet... was vomiting, feeling nauseas, fevers, night sweats and dizzyness.... Last bowl movement was today at 9:45am.... Has stopped his Amlodipine b/c feels its making him sick. CBG Result 135 CBG Device ID N non fasting  Does patient need assistance? Functional Status Self care Ambulation Normal   CC:  Began w/diarrhea last Thursday and has been having it since then. On a clear diet... was vomiting, feeling nauseas, fevers, and night sweats and dizzyness.... Last bowl movement was today at 9:45am.... Has stopped his Amlodipine b/c feels its making him sick..  History of Present Illness: 1.  Diarrhea:  Symptoms started 6 days ago with general malaise.  Developed nausea, headache, fevers and chills.  States night sweats started the night previously.  Diarrhea started that night (6 days ago).  Has not been eating solid food much, but did have a fish dinner at a restaurant 2 evenings ago.  Stools are yellow with mucousy white particles.  No melena or hematochezia.  Anal area now very irritated.   Last fever was last night at 101. Feels very bloated.   No sore throat, does have nasal congestion, no sneezing or ear pain.  Does not feel his bp meds are causing him any problems  Pt's wife not ill recently.  Cannot recall being around anyone ill recently.    Current Medications (verified): 1)  Aspirin 81 Mg Tbec (Aspirin) .... Take 1 Tablet By Mouth Once A Day 2)  Protonix 40 Mg Tbec (Pantoprazole Sodium) .... Take 1 Tablet By Mouth Two Times A Day 3)  Nitroquick 0.4 Mg Subl  (Nitroglycerin) .... Place One Pill Under The Tongue As Needed For Chest Pain 4)  Cyanocobalamin 1000 Mcg/ml Soln (Cyanocobalamin) .... Inject 1000 Micrograms Intramuscularly Once Daily For 7 Days, Then Once Weekly For 7 Weeks, Then Once A Month 5)  Hydrocortisone Acetate 25 Mg  Supp (Hydrocortisone Acetate) .Marland Kitchen.. 1 Supp Per Rectum Two Times A Day. 6)  Lisinopril-Hydrochlorothiazide 20-12.5 Mg Tabs (Lisinopril-Hydrochlorothiazide) .... Take 1 Tablet By Mouth Once A Day 7)  Simvastatin 20 Mg Tabs (Simvastatin) .Marland Kitchen.. 1 Tab By Mouth Daily 8)  Amlodipine Besylate 10 Mg Tabs (Amlodipine Besylate) .Marland Kitchen.. 1 Tab By Mouth Daily  Allergies (verified): No Known Drug Allergies  Physical Exam  General:  NAD Lungs:  Normal respiratory effort, chest expands symmetrically. Lungs are clear to auscultation, no crackles or wheezes. Heart:  Normal rate and regular rhythm. S1 and S2 normal without gallop, murmur, click, rub or other extra sounds. Abdomen:  soft, normal bowel sounds, no masses, no guarding, no rigidity, no rebound tenderness, no hepatomegaly, and no splenomegaly.  Mild epigastric tenderness.   Impression & Recommendations:  Problem # 1:  GASTROENTERITIS (ICD-558.9) Supportive care Evaluate for something other then viral etiology Orders: T-CBC w/Diff ([id number]) T-Basic Metabolic Panel [id number]) T-Culture, Stool ([id number]) T- * Misc. Laboratory test [id number]) T-Sed Rate (Automated) [id number])  Complete Medication List: 1)  Aspirin 81 Mg Tbec (Aspirin) .... Take 1  tablet by mouth once a day 2)  Protonix 40 Mg Tbec (Pantoprazole sodium) .... Take 1 tablet by mouth two times a day 3)  Nitroquick 0.4 Mg Subl (Nitroglycerin) .... Place one pill under the tongue as needed for chest pain 4)  Cyanocobalamin 1000 Mcg/ml Soln (Cyanocobalamin) .... Inject 1000 micrograms intramuscularly once daily for 7 days, then once weekly for 7 weeks, then once a month 5)  Hydrocortisone Acetate 25  Mg Supp (Hydrocortisone acetate) .Marland Kitchen.. 1 supp per rectum two times a day. 6)  Lisinopril-hydrochlorothiazide 20-12.5 Mg Tabs (Lisinopril-hydrochlorothiazide) .... Take 1 tablet by mouth once a day 7)  Simvastatin 20 Mg Tabs (Simvastatin) .Marland Kitchen.. 1 tab by mouth daily 8)  Amlodipine Besylate 10 Mg Tabs (Amlodipine besylate) .Marland Kitchen.. 1 tab by mouth daily  Other Orders: Capillary Blood Glucose/CBG (04540)  Patient Instructions: 1)  Clear liquids--just sip all day long. 2)  Avoid sugary drinks. 3)  BRAT diet:  Bananas, rice or rice cereal, apple sauce, toast, crackers. 4)  Follow you blood sugars and let me know if getting higher. 5)  Call if unable to keep fluids down.,   Orders Added: 1)  Capillary Blood Glucose/CBG [82948] 2)  Est. Patient Level III [98119] 3)  T-CBC w/Diff [[id number]] 4)  T-Basic Metabolic Panel [[id number]] 5)  T-Culture, Stool [[id number]] 6)  T- * Misc. Laboratory test [99999] 7)  T-Sed Rate (Automated) [[id number]]

## 2010-09-09 NOTE — Progress Notes (Signed)
Summary: Persistent diarrhea, fever  Phone Note Call from Patient   Summary of Call: Diarrhea started last Friday, denies different food/drink/meds. Having 3-4 times per day since Saturday, cleared up some Friday.  Denies blood in urine, yellow-green and loose.  Has fever on and off, running around 101.  Taking tylenol for fever, having night sweats.  Has been taking in plenty of fluids, denies vomiting, some nausea.  Is hoarse, denies cough or sore throat, has lots of nasal drainage, yellowish.  Slight headaches, no nasal discomfort.  Denies CP, SOB.  Took immodium Friday and some yesterday, still having diarrhea and bloating, some abdominal discomfort due to bloating and diarrhea.  Urinating less than usual.  Also having dizziness, some weakness.  Acute appt. scheduled with Dr. Delrae Alfred today.  Initial call taken by: Dutch Quint RN,  September 02, 2010 9:59 AM

## 2010-11-04 NOTE — Op Note (Signed)
[name], [name]           ACCOUNT NO.:  [id number]   MEDICAL RECORD NO.:  [id number]          PATIENT TYPE:  AMB   LOCATION:  ENDO                         FACILITY:  [facility]   PHYSICIAN:  James L. Malon Kindle., M.D.DATE OF BIRTH:  Nov 12, 1950   DATE OF PROCEDURE:  07/27/2007  DATE OF DISCHARGE:                               OPERATIVE REPORT   PROCEDURE:  Colonoscopy and biopsy.   MEDICATIONS:  Fentanyl 100 mcg, Versed 10 mg IV.   SCOPE:  Pentax adult scope.   INDICATIONS:  The patient has had a long history of perirectal disease  and has carried a diagnosis of Crohn's but evaluation of his record that  I had available to me has shown no pathological radiographic diagnosis  of Crohn's.  Diagnosis is entirely been based on perirectal disease.  He  has never had a colonoscopy.  For these reasons, we felt that  colonoscopy for diagnosis was indicated.   DESCRIPTION OF PROCEDURE:  Procedure explained to the patient, consent  obtained.  Left lateral decubitus position the Pentax adult scope was  inserted and prep was excellent.  We were able to advance easily to the  cecum, ileocecal valve was identified and after a while was entered.  The terminal ileum was endoscopically normal for about 5 cm and not  biopsied.  Scope was withdrawn.  The colonic mucosa was completely  normal throughout.  There was mild to moderate diverticulosis scattered  in the left colon.  Multiple random biopsies were obtained to look for  signs of Crohn's disease.  The mucosa appeared endoscopically normal.  In the rectum the scope was retroflexed.  There were really very few  internal hemorrhoids and no signs at all of Crohn's disease.  The  patient tolerated the procedure well.   ASSESSMENT:  1. 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  2. Mild diverticulosis.   PLAN:  Will check path, keep him on the  same medications for now and see  him back in the office in one month.           ______________________________  Llana Aliment. Malon Kindle., M.D.     Waldron Session  D:  07/27/2007  T:  07/28/2007  Job:  [id number]   cc:   Marcene Duos, M.D.  Thomas A. Cornett, M.D.

## 2010-11-04 NOTE — H&P (Signed)
[name], [name]           ACCOUNT NO.:  [id number]   MEDICAL RECORD NO.:  [id number]          PATIENT TYPE:  EMS   LOCATION:  MAJO                         FACILITY:  [facility]   PHYSICIAN:  Lucita Ferrara, MD         DATE OF BIRTH:  [birth year]   DATE OF ADMISSION:  04/07/2008  DATE OF DISCHARGE:                              HISTORY & PHYSICAL   CHIEF COMPLAINT:  Chest pain.   PRIMARY CARE PHYSICIAN:  Marcene Duos, M.D.   HISTORY OF PRESENT ILLNESS:  The patient is a 60 year old African  American male who presents to [facility] with chief  complaint of chest pain located in the left anterior precordial area  radiating to the left arm, pressure-like in intensity.  Worst episode  was 10/10 in intensity.  Currently it is a 2/10 in intensity, pressure-  like in description, not associated with food.  There is some  association with diaphoresis and shortness of breath.  There is no  orthopnea or lower extremity edema or nausea or vomiting.  There is some  gastritis and burning.  There is no other abdominal pain.  There is no  changes in his bowel habits.   PAST MEDICAL HISTORY:  His past medical history is significant for:  1. Crohn disease with recurrent abscess.  2. Hypertension.  3. Diabetes type 2.  4. History of atrial fibrillation.  5. History of Casimiro Needle mitral regurgitation  6. History of hiatal hernia.  7. Bilateral cataracts.  8. Eczema.  9. Dyslipidemia.  10.Tonsillectomy.  11.History of substance abuse with the remote history of using cocaine      greater than 34 years ago.  12.The patient had cardiac catheterization May 28, 2006 which      showed normal coronary arteries with no significant coronary      calcification, performed by Dr. Donnie Aho.   FAMILY HISTORY:  Coronary artery disease.  Mother died of breast cancer.  Father is living at 49.  Had a previous bypass graft x2.  Sister in good  health.   PAST SURGICAL HISTORY:  As above,  catheterization.   SOCIAL HISTORY:  The patient denies drugs, alcohol or tobacco.   ALLERGIES:  NO KNOWN DRUG ALLERGIES.   MEDICATIONS:  1. Protonix 40 mg p.o. daily which he ran out of.  2. Vicodin.   REVIEW OF SYSTEMS:  Review of systems as per HPI, otherwise negative.   PHYSICAL EXAMINATION:  GENERAL:  The patient is in no acute distress.  VITAL SIGNS:  Blood pressure is 143/91, pulse 60, respirations 12,  temperature 97.3, pulse ox 100% on room air.  HEENT:  Normocephalic, atraumatic.  Sclerae is anicteric.  Extraocular  muscles intact.  NECK:  Supple.  No JVD or bruits.  CARDIOVASCULAR:  S1 and S2, regular rhythm.  No murmurs, rubs, clicks.  Lungs: Clear to auscultation bilaterally without rhonchi, rales or  wheezes.  NEUROLOGIC:  The patient is oriented x3.  Cranial nerves II-XII grossly  intact.   LABORATORY DATA:  Laboratory data from October 7 negative.  First set of  cardiac markers negative and negative  troponin.  CBC within normal  limits.   RADIOLOGICAL DATA:  Formal chest x-ray shows no active pulmonary  disease.  EKG shows normal sinus rhythm.  No ST-T wave changes.   ASSESSMENT:  The patient is a 60 year old with:  1. Atypical type of chest pain, low risk factors as the patient has      had coronary evaluation with cardiac catheterization 2007 which was      negative.  He has no EKG changes.  2. History of atrial fibrillation.  3. History of mitral regurgitation.  4. Hiatal hernia.  5. Diabetes type 2.   DISCUSSION AND PLAN:  Will go ahead and admit the patient to the medical  telemetry.  Will cycle his cardiac enzymes x3 q.8h.  The patient may  need cardiology consultation or stress test here in the hospital before  leaving the hospital.  Repeat his EKG in the morning.  Will need to  confirm listing of his medications given that his pain is atypical, but  will resume his Protonix.  May be related to his hiatal hernia or  gastroesophageal reflux disease.   He is hemodynamically stable.  His  blood pressure is moderately controlled.  Will need to put him on  telemetry to monitor his rhythm.  Rest of plans depend on his progress  and consult recommendations.      Lucita Ferrara, MD  Electronically Signed     RR/MEDQ  D:  04/07/2008  T:  04/07/2008  Job:  [id number]

## 2010-11-07 NOTE — Consult Note (Signed)
[name], [name]           ACCOUNT NO.:  [id number]   MEDICAL RECORD NO.:  [id number]          PATIENT TYPE:  INP   LOCATION:  3735                         FACILITY:  [facility]   PHYSICIAN:  Sandria Bales. Ezzard Standing, M.D.  DATE OF BIRTH:  [birth year]   DATE OF CONSULTATION:  05/27/2006  DATE OF DISCHARGE:                                 CONSULTATION   CONSULTING SURGEON:  Dr. Ezzard Standing.   PRIMARY CARE PHYSICIAN:  Dr. Ardyth Harps with [facility].   GASTROENTEROLOGIST:  The patient has seen Dr. Sherin Quarry in the past.   CARDIOLOGIST:  The patient has seen Dr. Donnie Aho in the past.   REASON FOR CONSULTATION:  Recurrent perirectal abscess versus perirectal  fistula and known Crohn's disease.   HISTORY OF PRESENT ILLNESS:  [name] is an 60 year old male patient  history of hypertension, diabetes was diagnosed with Crohn's disease by  Dr. Sherin Quarry about 11 years ago.  He was last hospitalized in  December2006 with cardiac symptoms.  Workup at that time was negative  for ischemia.  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  This was confirmed on CT scan.  At  that time, the patient requested outpatient treatment and was to follow  up with GI.  He was subsequently discharged home.  Because of financial  concerns and inability to pay for follow-up care, the patient did not  follow up with a gastroenterologist   Since that time, the patient has noticed an increased frequency of  recurrence of perirectal complaints involving pain, swelling of the  anorectal area, foul-smelling dark and sometimes purulent yellow  drainage.  Usually these episodes would occur about every six weeks and  resolve spontaneously.  They are now occurring weekly over the past few  months.  The patient states his Crohn's disease has only been treated  with prednisone and at least for a week or two he has been out of  prednisone.  He denies any prior treatment with Asacol or  any other  immunosuppressive type medications for inflammatory bowel disease and he  has not been treated with any intrarectal inflammatory bowel disease  medications.  Per the notes on E-chart, the patient's initial diagnosis  of Crohn's was felt to be at the level of the terminal ileum.  He denies  any prior history of bowel obstruction or abdominal surgeries.  Because  of the appearance of a perirectal abscess and possible fistula, surgical  consultation has been requested.   REVIEW OF SYSTEMS:  As above.  No fevers, no chills.  He has had mild to  moderate exertional dyspnea.  He has noticed recent increase in nocturia  and occasional difficulty starting his urine.  He has not followed up  with gastroenterology or other specialist secondary to financial issues.   PAST MEDICAL HISTORY:  1. Hypertension.  2. Atrial fibrillation, last hospitalization currently maintaining      sinus rhythm not on Coumadin.  3. Crohn's disease.  4. Dyslipidemia.  5. Diabetes mellitus.   PAST SURGICAL HISTORY:  Tonsillectomy.   SOCIAL HISTORY:  The patient has been known  to socially use cocaine,  last use was at least two years ago.  No alcohol.  He does smoke.  He  denies any unusual sex practices including anal sexual activity.   FAMILY HISTORY:  Mother with Crohn's.   ALLERGIES:  NKDA.   CURRENT MEDICATIONS:  At home, the patient takes aspirin and Lopressor.  Since admission the patient has been started on Lovenox subcutaneous  full dose, aspirin, metoprolol, Reglan, Tylenol p.r.n., nitroglycerin,  Cipro and Flagyl, glipizide.   PHYSICAL EXAMINATION:  GENERAL:  Pleasant male patient complaining of  significant problems, more so over the past few months, regarding rectal  regarding rectal pain and drainage.  VITAL SIGNS:  Temperature 97, BP 133/91, pulse 65 and regular,  respirations 28.  NEURO:  The patient is alert and oriented x3.  Moving all extremities  x4.  No focal deficits.   HEENT:  Head is normocephalic.  Sclera noninjected.  NECK:  Supple.  No adenopathy.  CHEST:  Bilateral lung sounds are clear to auscultation.  Respiratory  effort is nonlabored.  CARDIAC:  S1-S2 without rubs, murmurs, thrills or gallops.  Telemetry  demonstrates sinus rhythm.  No JVD.  ABDOMEN:  Soft, nontender, nondistended without hepatosplenomegaly,  masses or bruits.  Bowel sounds present in all four quadrants.  RECTAL:  There is an indurated and tender area at about 3 o'clock.  This  area is about 3-4 cm in diameter.  It extends away from the perirectal  border into the right gluteal region.  This area is tender.  No exudate  seen actually over the indurated area but there is purulent debris  around the anus.  I do not detect any hemorrhoids and I have pulled  apart the rectum to the best of my ability and was unable to see any  actual fissures within the rectal sphincter area.  He was tender with  gentle pressure application to the rectal region.  No obvious rectal  fissure or fistula noted on clinical exam.  EXTREMITIES:  Symmetrical in appearance without edema, cyanosis or  clubbing.   LAB:  Cardiac panel has been negative x2.  PT 12.5, INR 0.9, PTT 26, BNP  is less than 30.  Sodium 138, potassium 4.2, CO2 23, BUN 20, creatinine  1.2.  LFTs are normal.  White count is 5300, hemoglobin 14, platelets  271,000, neutrophils 60%.  Diagnostics chest x-ray shows no acute  process.  EKG shows sinus rhythm without any definite ischemic changes.   IMPRESSION:  1. Rectal pain:  Rule out abscess versus fistula versus both.  2. Prior history of Crohn's disease on chronic prednisone treatment.  3. Chest pain:  Ischemic workup in progress.  4. Hypertension.  5. Diabetes mellitus.  6. Social concerns regarding medication noncompliance and follow-up      related to financial concerns.  PLAN:  Of note, patient had a CT scan in December 2006 which showed no  stimata of Crohn's!?  I do not  know how solid his diagnosis is of  Crohn's disease.  We will go ahead and check a CT of the abdomen and pelvis to clarify if  the patient has a deeper rectal abscess and to rule out if he has any  related fistula that may be related to rectal Crohn's disease.  The  contrasted CT will also allow [place] to see possibly if he has any other  radiographic evidence of Crohn's disease.  Agree with treatment with  Cipro and Flagyl.  I recommend getting a  GI consultation to determine if the patient is on  adequate medical therapy for Crohn's disease, especially if it is felt  he has significant perirectal disease.  He may benefit from intrarectal  Asacol or other inflammatory bowel disease medications.  Of note, one year ago, he saw Dr. Ewing Schlein who suggested a colonscopy  (which I agree with), but this has never been done.  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  But before any surgical therapy is contemplated, he needs  to be plugged in with a GI physician with whom  he will permit treatment  and followup.      Allison L. Rennis Harding, N.P.      Sandria Bales. Ezzard Standing, M.D.  Electronically Signed    ALE/MEDQ  D:  05/27/2006  T:  05/27/2006  Job:  [id number]   cc:   Peggye Pitt, M.D.  Georga Hacking, M.D.  Tasia Catchings, M.D.

## 2010-11-07 NOTE — H&P (Signed)
[name], [name]           ACCOUNT NO.:  [id number]   MEDICAL RECORD NO.:  [id number]          PATIENT TYPE:  EMS   LOCATION:  MAJO                         FACILITY:  [facility]   PHYSICIAN:  Georga Hacking, M.D.DATE OF BIRTH:  1951/05/05   DATE OF ADMISSION:  05/21/2005  DATE OF DISCHARGE:                                HISTORY & PHYSICAL   HISTORY OF PRESENT ILLNESS:  A 60 year old black male with a known history  of Crohn's disease and hypertension.  He quit taking his Lopressor several  days ago when he went to urgent medical care complaining of hives.  He was  treated with 20 mg of prednisone at that time.  He has been on a tapering  course of prednisone because of hives and came to the emergency room with  increasing edema, shortness of breath and some vague chest tightness and a  feeling as if something was unusual in his chest.  He was found to be in  atrial fibrillation and was started on therapy with intravenous diltiazem.  He is not currently having any chest discomfort.  Initial point of care  enzymes were unremarkable.  The patient also was noted to be diabetic and  has mild renal insufficiency.   PAST MEDICAL HISTORY:  Hypertension.  There is a remote history of cocaine  abuse and he has recently been treated for hives.   PAST SURGICAL HISTORY:  None.   ALLERGIES:  None.   CURRENT MEDICATIONS:  He is currently on prednisone.  He previously was on  Lopressor.   FAMILY HISTORY:  Mother died of cancer of the breast.  Father is living age  64 and has had previous bypass grafting twice.  He has one sister in good  health.   SOCIAL HISTORY:  He currently lives with a girlfriend.  He has a remote  history of cocaine and alcohol abuse but states he has not used cocaine in  over one year.  He has a history of hypertriglyceridemia in the past also  and has a history of prostatitis.  He evidently has previous appendectomy  also.   REVIEW OF SYSTEMS:  When he was  admitted to the hospital one year ago he had  a dilated ventral with global hypokinesis.  He evidently had a chest pain  thought due to recent cocaine abuse and Cardiolite stress test was abnormal.  A catheterization was recommended but he refused at that time.  He also was  begun on hydrochlorothiazide and was sent home on prednisone.  He has a  history of ocular herpes.  He wears glasses.  He has no difficulty  swallowing.  He has episodic diarrhea and episodic hematochezia.  He has a  history of prostatitis in the past.  No history of arthritis.   PHYSICAL EXAMINATION:  GENERAL:  He is a middle-aged black male who is  currently in no acute distress.  VITAL SIGNS:  His blood pressure is currently 140/80, pulse 84 and  irregularly irregular.  SKIN:  Warm and dry.  HEENT:  Unremarkable.  He wears glasses.  He has a balding male hair  pattern.  No difficulty swallowing.  LUNGS:  Clear.  CARDIAC:  Irregular rhythm, Normal S1 and S2.  No S3.  ABDOMEN:  Soft, nontender.  EXTREMITIES:  Femoral and distal pulses are 2+, no edema noted.   LABORATORY DATA:  He has a glucose of 310.  He has a BUN of 22 and a  creatinine of 1.5.  PT and PTT were normal.  CBC was normal.  EKG shows  atrial fibrillation.   IMPRESSION:  1.  Atrial fibrillation of undetermined onset.  2.  History of cardiomyopathy.  3.  History of cocaine abuse.  4.  History of Crohn's disease.  5.  Recent hives treated with prednisone.  6.  Elevated serum glucose without previous diagnosis of diabetes.   RECOMMENDATIONS:  He will be placed on Lovenox.  Echocardiogram will be  obtained.  We will have an internal medicine consult for treatment of all  the other medical issues.      Georga Hacking, M.D.  Electronically Signed     WST/MEDQ  D:  05/21/2005  T:  05/21/2005  Job:  [id number]   cc:   [facility]

## 2010-11-07 NOTE — Discharge Summary (Signed)
[name], [name]                     ACCOUNT NO.:  [id number]   MEDICAL RECORD NO.:  [id number]                   PATIENT TYPE:  INP   LOCATION:  2014                                 FACILITY:  [facility]   PHYSICIAN:  [name], M.D.                DATE OF BIRTH:  [birth year]   DATE OF ADMISSION:  02/27/2004  DATE OF DISCHARGE:  02/29/2004                                 DISCHARGE SUMMARY   PATIENT ADDRESS:  [address]  [place], [place]  [address]  Ph. [phone]   DISCHARGE DIAGNOSES:  1.  Atypical chest pain and syncope.  2.  Hypertension.  3.  Hypertriglyceridemia.  4.  Crohn's.   ADMISSION DIAGNOSIS:  Atypical chest pain and syncope.   PAST MEDICAL HISTORY:  1.  Crohn's.  2.  Prostatitis.  3.  Cocaine abuse.  4.  Alcohol abuse.  5.  Tobacco abuse.  6.  Status post appendectomy.   This patient was an unassigned patient.   DISCHARGE MEDICATIONS:  1.  Aspirin 325 mg p.o. daily.  2.  Hydrochlorothiazide 25 mg p.o. daily.  3.  Multivitamin one p.o. daily.  4.  Prednisone as before.  5.  Vicodin as before.   FOLLOW-UP APPOINTMENT:  Scott Earing, MS-4 at [facility] on Tuesday, March 04, 2004, at 2:30 p.m., to address patient's  need to follow up with cardiology for catheterization, hypertension,  hypertriglyceridemia, and Crohn's.  The patient is to have a CBC and basic  metabolic panel drawn at the clinic.   DISPOSITION:  The patient is to be discharged home.  He is in stable  condition, and the patient is to follow up in the clinic and with  cardiology.   PROCEDURES:  1.  Chest x-ray on February 27, 2004 showed borderline cardiac enlargement      and minimal subsegmental atelectasis at the left base.  2.  A 2-D echocardiogram on February 28, 2004, showed overall left      ventricular ejection fraction of 50 to 55%, left ventricular systolic      function at the lower limits of normal, no left ventricular wall  motion      abnormalities, mild focal basal septal hypertrophy, aortic valve mildly      calcified, mild mitral regurgitation.  3.  Cardiolite stress test on February 28, 2004, showed moderate motion      artifact resulting in mild blurring of the inferoseptal wall.  No      perfusion defect is identified to suggest scar or inducible ischemia.      End diastolic volume equals 161 ml.  End systolic volume equals 51 ml.      Left ventricular resting ejection fraction equals 31%.  There is global      hypokinesis.   IMPRESSIONS:  Dilated left ventricle with global hypokinesis.  No inducible  ischemia or evident scar.   CONSULTATIONS:  [name], cardiology.  HISTORY OF PRESENT ILLNESS:  [name] is a 60 year old black male with  Crohn's disease diagnosed in 1995 followed by [name], prostatitis,  history of appendectomy, who presents to the ED with one day of substernal  chest pain radiating into his left arm/numbness associated with  diaphoresis/nausea/vomiting x 1 this morning.  The patient awoke at 7 a.m.  with bilateral hand and arm numbness.  The patient went back to sleep, awoke  again about 8:30 with the above symptoms.  He went to his neighbors and the  store across the street for help, apparently had a true syncopal episode  outside of the store and was taken to the fire department where EMS was  called.  He was given one sublingual nitroglycerin which resolved his chest  pain.  The patient is currently symptom-free.   PHYSICAL EXAMINATION:  VITAL SIGNS:  98.0, 132/92, 64, and 97% on room air.  GENERAL:  The patient is an overweight, pleasant African-American male,  appears comfortable.  HEENT:  Pupils were equally round, and reactive to light and accommodation.  Extraocular muscles were intact.  Sclerae were white.  Oropharynx was clear,  uvula midline.  No jugular venous distention.  There were basilar crackles,  question mild.  HEART:  Regular rate and rhythm  without murmurs, gallops, or rubs.  ABDOMEN:  Soft, diffusely tender.  No organomegaly.  Bowel sounds were  hyperactive.  EXTREMITIES:  No clubbing, cyanosis, or edema was noted, and patient had 2+  pedal pulses.  Strength was 4/5 in the right lower extremity, and 5/5 in the  left lower extremity and bilateral upper extremities; 2+ biceps and knee  reflexes.   LABORATORIES:  CBC included a white count of 4.1, hemoglobin 13.1, platelets  223.  Basic metabolic panel included a sodium of 141, potassium 4.1,  chloride 108, bicarbonate 27, BUN 17, creatinine 1.2, glucose 117.  Total  bilirubin was 0.3.  AST 18, ALT 16, total protein 6.2, albumin 3.4,  coagulants 8.5, PTT 26, PT 11.3.  EKG showed V2 through V4 ST segment  elevations less than 2 mm.   HOSPITAL COURSE:  #1. Syncope/atypical chest pain, most likely secondary to  recent cocaine use.  The patient was admitted to telemetry.  His cardiac  enzymes were negative x 3.  He had minimal EKG changes on admission.  No  further EKG changes were noted throughout his stay.  Cardiology was  consulted, and patient had a Cardiolite stress test which was abnormal.  Dr.  Jacinto Travis recommended catheterization to the patient.  The patient refused.  Risks of no catheterization versus catheterization were explained to the  patient.  The patient understood, said he did not want to be catheterized  today but would schedule one as an outpatient.  The patient had no further  episodes of chest pain or syncope after admission.   #2. Hypertension.  A beta blocker was started and discontinued when the  recent cocaine use by the patient was elicited.  The patient was started on  hydrochlorothiazide secondary to the patient's limited funds, and his  medication will be adjusted as an outpatient, to consider an ACE inhibitor  and a beta blocker when cocaine-free.   #3. Crohn's.  The patient was not adequately being treated with prednisone. However, he was sent home  on the prednisone secondary to his limited funds,  and he will follow up in the clinic as an outpatient.  He may need a GI  consult and to consider starting  on a 5-ASA regimen such as mesalamine.   #4. Substance abuse.  The patient was told to stop using cocaine, alcohol,  and tobacco.  The benefits and harms were explained to him.  Smoking  cessation consult spoke to the patient and offered cessation support.  The  patient was also offered substance abuse counseling for his cocaine use.  The patient refused and stated he could handle it on his own.   DISCHARGE LABORATORIES:  The patient's total cholesterol was 124,  triglycerides 184, HDL 50, LDL 37, BLDL 37.  No other labs were ordered on  the day of discharge.  There are no pending labs.   DISCHARGE VITAL SIGNS:  Temperature 97.3, pulse 84, respirations 20, blood  pressure 144/90, and he was saturating at 96% on room air.      Eliseo Gum, M.D.                      [name], M.D.    KC/MEDQ  D:  02/29/2004  T:  03/01/2004  Job:  [id number]   cc:   Cristy Hilts. [name], M.D.  [address]  [place]  [place] [address]  Fax: [phone]

## 2010-11-07 NOTE — Cardiovascular Report (Signed)
[name], [name]           ACCOUNT NO.:  [id number]   MEDICAL RECORD NO.:  [id number]          PATIENT TYPE:  INP   LOCATION:  3735                         FACILITY:  [facility]   PHYSICIAN:  Georga Hacking, M.D.DATE OF BIRTH:  Jun 30, 1950   DATE OF PROCEDURE:  05/28/2006  DATE OF DISCHARGE:                            CARDIAC CATHETERIZATION   HISTORY:  A 61 year old black male with previous history of  hypertension, mild diabetes, cigarette abuse, and family history of  cardiac disease who presented to the hospital with complaints of  substernal chest discomfort and progressive possible angina.  MI was  ruled out.  EKG was normal.   PROCEDURE:  Left heart catheterization with coronary angiograms and left  ventriculogram.   COMMENTS ABOUT PROCEDURE:  The right femoral artery was entered using a  single anterior needle wall stick, and the procedure was done with 6-  [place] catheters.  A right coronary bypass catheter best selected to the  right coronary artery because of the high takeoff of a large conus  branch.  He was taken off the table at the end of the procedure and to  the holding area for removal of the sheath.   HEMODYNAMIC DATA:  The aorta postcontrast was 160/85, LV postcontrast  160/10.   ANGIOGRAPHIC DATA:  Left ventriculogram:  Performed in the 30-degree RAO  projection.  The aortic valve was normal.  The mitral valve was normal.  The left ventricle appears normal in size.  The estimated ejection  fraction was 65-70%.  Coronary arteries:  Arise and distribute normally.  There was no significant coronary calcification noted.  The left  coronary artery is short.  There is almost a dual ostium of the  circumflex and LAD.  The left anterior descending is a large vessel with  mild tortuosity that extends around the apex.  A series of small  diagonal branches arise, and there is no disease noted.  The circumflex  coronary artery:  Large first marginal branch arises  which is mildly  tortuous.  Continuation branch supplies two small posterolateral  branches and is free of disease.  The right coronary artery is a  dominant vessel with a posterior descending and posterolateral branch.  A large conus branch arises, and the artery appears normal.   IMPRESSION:  1. Normal coronary arteries with no significant coronary calcification      or luminal irregularities noted.  2. Normal left ventricular function.   RECOMMENDATIONS:  The patient likely has hypertensive heart disease.  He  does not have significant coronary artery disease identified at this  angiogram.  It is recommended that he undergo strict risk factor  modification with cessation of cigarette smoking, good control of blood  pressure, and control of diabetes and that he exercise regularly.      Georga Hacking, M.D.  Electronically Signed     WST/MEDQ  D:  05/28/2006  T:  05/28/2006  Job:  [id number]   cc:   Internal Medicine Teaching Service

## 2010-11-07 NOTE — Consult Note (Signed)
NAMECLARK, CUFF           ACCOUNT NO.:  [id number]   MEDICAL RECORD NO.:  [id number]          PATIENT TYPE:  INP   LOCATION:  3735                         FACILITY:  [facility]   PHYSICIAN:  Georga Hacking, M.D.DATE OF BIRTH:  [birth year]   DATE OF CONSULTATION:  05/27/2006  DATE OF DISCHARGE:                                 CONSULTATION   I was asked to see this 60 year old black male for evaluation of chest  discomfort.  He has a previous history of cocaine abuse, alcohol abuse,  and tobacco abuse.  He has been diagnosed with hypertension and diabetes  and has a reported history of Crohn's disease.  He has been somewhat  noncompliant with his care.  He has been seen at [facility] in the past.  He was hospitalized a couple of years ago with  chest discomfort in the setting of cocaine abuse.  He refused  catheterization at that time.  A Cardiolite stress test was abnormal  then.  He had an ejection fraction of 31%.  I saw him a year ago when he  was admitted with atrial fibrillation and chest discomfort and he  converted to sinus rhythm.  He, again, refused catheterization at that  time and wished to go home.  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  He has  had exertional chest discomfort when walking up stairs or doing activity  for some time.  This morning he developed chest pain described as a  stabbing pain.  It was associated with nausea, shortness of breath, and  presented to the urgent care center.  He was admitted to the hospital  earlier to rule out a myocardial infarction and is currently pain-free.  He reports that he has been having a flare of his Crohn's disease over  the past few days.   PAST HISTORY:  Remarkable for possible fistula in the past.  He has type  2 diabetes mellitus.  Hemoglobin A1c was 5.7 in November 2006.  He has a  history of atrial fibrillation and mitral  regurgitation.  History of  hiatal hernia, cataracts, and hypertension.   PAST SURGICAL HISTORY:  None.   ALLERGIES:  None.   CURRENT MEDICINES INCLUDE:  Lopressor.  He previously was on prednisone,  aspirin, and Vicodin as well as multivitamins.   SOCIAL HISTORY:  He states that he is engaged.  He has used Scientist, water quality.  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   FAMILY HISTORY:  Mother died of breast cancer at age 44.  Father is  living with a history of bypass grafting.  Sister has a history of  breast cancer.  He has five children, living.   REVIEW OF SYSTEMS:  He has had significant chest pain and some apnea  with snoring.  He has had some nausea, vomiting and occasional  hematochezia.  He has moderate dyspnea on exertion.  He has a history of  alcohol and cocaine abuse in the past.  He has a history of  hypertriglyceridemia.  ON EXAMINATION:  GENERAL:  He is a middle-aged black male who is  pleasant and currently in no acute distress.  VITAL SIGNS:  Blood pressure 160/93.  Pulse 70.  SKIN:  Warm and dry.  ENT:  EOMI, PERRLA.  Fundi not examined.  Pharynx negative.  NECK:  Supple without masses, JVD, thyromegaly, or bruits.  LUNGS:  Clear.  CARDIAC EXAM:  Normal S1, S2.  No S3 or murmur.  ABDOMEN:  Soft and nontender.  EXTREMITIES:  Distal pulses are 2+.  EKG is within normal limits.  His labs show a glucose of 108.  Negative  cardiac markers.  Cholesterol of 188.  Triglycerides 200.  LDL  cholesterol of 100.   IMPRESSION:  1. Chest pain suggestive of unstable angina.  2. Hypertension, not well controlled.  3. History of glucose intolerance.  4. History of Crohn's disease.  5. History of substance abuse with cocaine remotely, over 2 years ago.  6. History of mitral regurgitation.   RECOMMENDATIONS:  I believe the patient should go directly to  catheterization at this point with the suggestive history of chest  discomfort and multiple risk factors.   The cardiac catheterization  procedure was discussed with the patient in full length including any  risks of myocardial infarction, death, stroke, bleeding, arrhythmia, dye  allergy, or renal insufficiency.  He understands and is willing to  proceed.  I would continue treatment with aspirin.  Also get the  financial counselor to see the patient.      Georga Hacking, M.D.  Electronically Signed     WST/MEDQ  D:  05/27/2006  T:  05/27/2006  Job:  [id number]   cc:   Internal Medicine Teaching Service

## 2010-11-07 NOTE — Discharge Summary (Signed)
NAMECASSIEL, FERNANDEZ           ACCOUNT NO.:  [id number]   MEDICAL RECORD NO.:  [id number]          PATIENT TYPE:  INP   LOCATION:  3735                         FACILITY:  [facility]   PHYSICIAN:  C. Ulyess Mort, M.D.DATE OF BIRTH:  1950/08/06   DATE OF ADMISSION:  05/21/2005  DATE OF DISCHARGE:  05/22/2005                                 DISCHARGE SUMMARY   DISCHARGE DIAGNOSES:  1.  Chest pain, ruled out for myocardial infarction.  2.  New onset atrial fibrillation.  3.  Crohn's disease.  4.  Contact dermatitis.  5.  Hypertension.  6.  Type 2 diabetes.   DISCHARGE MEDICATIONS:  1.  Aspirin 81 mg p.o. daily.  2.  Metoprolol 50 mg p.o. b.i.d.  3.  Flagyl 500 mg daily for 5 more days.  4.  Ciprofloxacin 250 mg b.i.d. for 5 more days.  5.  Prednisone 5 mg p.o. daily.  6.  Glucophage 500 mg p.o. daily.   CONSULTANT:  Georga Hacking, M.D. from cardiology.   IMAGES:  1.  On May 21, 2005, a chest x-ray consistent with no acute      abnormality.  2.  On May 22, 2005, a computerized tomography scan of the abdomen and      pelvis consistent with no acute findings and negative abdominal and      pelvic computerized tomography scan.   ADMITTING HISTORY AND PHYSICAL EXAMINATION:  For full details, please refer  to the patient's chart.   In brief, Mr. Blyden is a 60 year old African-American male with past  medical history significant for hypertension, type 2 diabetes and Crohn's  disease with multiple episodes of atypical chest pain in the past who had a  Cardiolite in September of 2005 that showed no perfusion deficit suggestive  of no inducible ischemia and global hypokinesis who presented to the  emergency department complaining of a 1 day history of left anterior chest  pain with intensity of 5/10, no radiation that the patient describes as a  chest tightness.  This pain was associated with palpitations, shortness of  breath, diaphoresis, but no nausea and  vomiting.  The patient also states  that about a week prior to admission he changed the laundry detergent that  he normally uses and he broke out in hives and quite severe pruritus.  He  went to the urgent care center and was given a prednisone taper.  Two days  after this episode, the patient had chills and a measured rectal temperature  of 102 degrees F.  The patient denies abdominal pain.  On a regular basis,  he has about 2 bloody bowel movements a week and is also complaining of a  draining abscess in his perianal area.   ALLERGIES:  No known drug allergies.   HOME MEDICATIONS:  1.  Lopressor.  2.  Prednisone.  3.  Aspirin.  4.  Glucophage.   SUBSTANCE HISTORY:  The patient is a smoker, about 3 cigarettes a day for 2  years.  Before that, 2 packs a day for over 10 years.  Alcohol:  He drinks  about 2 beers per  week.  Cocaine:  Was a cocaine abuser, but quit 1 year  ago.  The patient is single.  He _________ and he lives with his girlfriend.   FAMILY HISTORY:  Mother died of breast cancer.  Sister recently diagnosed  with breast cancer.  Father has type 2 diabetes.   ADMISSION PHYSICAL EXAMINATION:  VITAL SIGNS:  Temperature 97.3, blood  pressure 112/71 with a heart rate of 96 to 170.  Respiratory rate 24,  saturation is 100% on room air.  GENERAL APPEARANCE:  On general exam, the patient was oriented x3 and not in  acute distress.  NECK:  His neck was supple with no JVD.  No lymphadenopathy and no bruits.  LUNGS:  His lung exam was clear to auscultation bilaterally.  CARDIOVASCULAR  EXAM:  Irregular rhythm with no murmur, rubs or gallops.  ABDOMEN:  Abdominal exam is soft, nontender, nondistended with positive  bowel sounds.  He had a draining fistula around the perianal area and a  culture of this fluid was taken.  EXTREMITIES:  No clubbing, cyanosis or edema with good distal pulses.  SKIN:  He has superficial scabs diffusely secondary to scratching.  NEUROLOGICAL:   Completely intact and nonfocal.   ADMISSION LABORATORY DATA:  Sodium 138, potassium 3.7, chloride 108, CO2 21,  BUN 22, creatinine 1.4, glucose 310.  WBC 11.6, hemoglobin 14.2 with an MCV  of 91.5.  PT 13.4, INR 1.0.  PTT 22.  Three negative sets of point of care  markers.   HOSPITAL COURSE:  Problem 1. Chest pain.  In the emergency department, the  patient was found to be in atrial fibrillation.  This is thought to be a new  diagnosis.  The emergency physician called cardiologist, Dr. Justice Britain  who was first to see the patient and started Mr. Holway on a Cardizem drip  to rate control his atrial fibrillation.  Also, wanted to start on Lovenox  for anticoagulation.  The patient was rate controlled nicely overnight with  heart rates in the 70's and 80's.  This morning, the patient is back in  normal sinus rhythm.  TSH was normal at 0.579.  Three sets of cardiac  enzymes are negative.  So, the patient has ruled out for MI.  Today's EKG  shows sinus bradycardia.  We will discharge the patient on Lopressor.  Cardizem will be discontinued.  The patient will have an outpatient follow  up with Dr. Viann Fish.  A 2D echocardiogram was performed, but at the  time of discharge, the results are pending.  The patient should follow up  with the results with his cardiologist, Dr. Donnie Aho.   Problem 2. Crohn's disease.  GI was consulted.  The patient was initially  placed on Cipro and Flagyl for a total of 7 days.  So, at the time of  discharge, he will have 5 more days of antibiotic therapy.  The patient is  regularly on 5 mg of prednisone daily and this will be continued.  The  patient had a CT of the abdomen and pelvis performed during his hospital  stay that showed no evidence of any acute abnormality.  He should follow up  with his GI doctor as an outpatient.   Problem 3. Contact dermatitis.  This happened approximately 1 week prior to admission.  The patient recently changed his  laundry detergent and he  believes that this is the reason for his contact dermatitis which is  possible.  The patient was placed on  Benadryl cream p.r.n. for pruritus.   Problem 4. Hypertension.  The patient's blood pressure was well controlled  throughout his entire hospital stay and he will be discharged on his home  regimen of Lopressor.   Problem 5. Type 2 diabetes.  The patient had elevated CBG's which may be  secondary to his steroids.  While he was in the hospital, he was switched  back to sliding scale insulin, but upon discharge I will restart him on his  home regimen of 500 daily of Glucophage.   DISCHARGE VITAL SIGNS:  Temperature 97.9, blood pressure 116/86, heart rate  of 70, respiratory rate 20, O2 saturation 97% on room air.   DISCHARGE LABORATORY DATA:  Sodium 138, potassium 3.9, chloride of 107, CO2  24, BUN 26, creatinine 1.4.  Glucose 120.  Calcium 8.8.  WBC 12.1,  hemoglobin 13.5 with an MCV of 92.6.  Glucose of 329, PT 13.4, INR 1.0, PTT  22.  PSA __________.  Hemoglobin A1c of 5.7 and an ESR of 3.  Chest x-ray  with no evidence of acute abnormality.      Peggye Pitt, M.D.    ______________________________  C. Ulyess Mort, M.D.    EH/MEDQ  D:  05/22/2005  T:  05/23/2005  Job:  [id number]   cc:   Georga Hacking, M.D.  Fax: [phone]  Email: [email or web address]

## 2010-11-07 NOTE — Consult Note (Signed)
NAMEDENZELL, COLASANTI           ACCOUNT NO.:  [id number]   MEDICAL RECORD NO.:  [id number]          PATIENT TYPE:  INP   LOCATION:  3735                         FACILITY:  [facility]   PHYSICIAN:  James L. Randa Evens, M.D. DATE OF BIRTH:  April 24, 1951   DATE OF CONSULTATION:  DATE OF DISCHARGE:                                 CONSULTATION   We were requested by Dr. Darrick Huntsman to do a consult on Ellwyn C. Hayduk,  date of birth 1951-01-27.  Consult date is today, May 28, 2006.  Consult requested for possible Crohn disease.   HISTORY OF PRESENT ILLNESS:  This is a 60 year old male admitted with  chest pain on May 27, 2006, complaining of recurrent perirectal  abscess with fistula.  He has blood at the onset of his bowel movements  and abdominal pain.  The patient reports bowel symptoms started  originally when he was 29 years told.  His mother has Crohn disease.  He  was diagnosed current by Dr. Farrel Demark many years ago.  He states that he is  chronically on Cipro and prednisone.  He ran out of Cipro approximately  2 months ago, ran out of prednisone approximately 3 weeks ago.  He is  positive for hematochezia, says his stools look like a barber pole.  He  is positive for perirectal abscess, which drains pus as well as fecal  matter.  It used to appear approximately once every few months, now  appears weekly.  The area is very tender according to the patient.  Positive for increased epigastric pain over the last two months, worse  with eating.  He is positive for vomiting the contents of his stomach  approximately three times a week for the last 5 months.  He is positive  for weight loss of 20 pounds since August 2007.  Positive for joint pain  in his back, shoulders and pelvis.  Positive for vision changes.  He was  diagnosed with cataracts just two weeks ago.  He is positive for skin  changes.  He reports red nodules on his anterior tibial area.   PAST MEDICAL HISTORY:  1.  Diabetes type 2.  2. Atrial fibrillation.  3. Mitral regurgitation.  4. Hiatal hernia.  5. His only surgery was a tonsillectomy.  6. His last colonoscopy was many years ago and done by Dr. Gerilyn Pilgrim.   CURRENT HOME MEDICATIONS:  Prednisone and ASA, Vicodin and metoprolol.   ALLERGIES:  He has no known drug allergies.   SOCIAL HISTORY:  He is positive for tobacco, currently smoking two  cigarettes a day, trying to quit.  He last used cocaine 2-1/2 years ago.  He is negative for alcohol.  He used to be a Psychologist, sport and exercise at [employer].   FAMILY HISTORY:  Significant for his mother with Crohn disease, his  father with ulcer disease.   REVIEW OF SYSTEMS:  Significant for increased dyspnea on exertion with  chest tightness and diaphoresis.  This limits his activities.  He had a  cardiac catheterization today that was negative; however, he reports  that he is unable to pass a stress  test, either treadmill or dobutamine.   PHYSICAL EXAMINATION:  GENERAL:  He is alert and oriented, in no  apparent distress.  He is pleasant to talk with.  CARDIOVASCULAR:  Regular rate and rhythm but drops a beat approximately  28 to 20 beats.  LUNGS:  Clear to auscultation bilaterally.  ABDOMEN:  Soft, nontender, nondistended, with positive bowel sounds.  No  masses.  No bruits.  RECTAL:  External rectal exam:  He has a 5 mm annular indurated lesion  on his right buttock.  He is very tender around the site.  There is no  drainage currently.   LABORATORY DATA:  A white count 5.0, with hemoglobin 13.1.  His Chem-7  is within normal limits.  CT on December 6 showed possible chronic  perirectal fistula with no bowel wall thickening.   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   PLAN:  To Dr. Randa Evens see the patient.  He is currently on Protonix,  Flagyl and Cipro in  the hospital.  Consider possible Remicade trial.  Dr. Carman Ching has seen and examined the patient, collected his  history.  He says that the patient has a history of Crohn's with  recurrent perirectal abscess.  The patient needs to be off prednisone.   RECOMMENDATIONS:  1. Antibiotics for several weeks, he is on the current dosages of      Cipro and Flagyl, with I&D  if needed.  No prednisone.  2. Will need colon exam at some point.  Will prefer to do this in the      next several weeks.  3. We are going to go ahead and order an upper GI series with small-      bowel follow-through now to rule out any other fistulas and      evaluate the status of his Crohn disease.      Stephani Police, PA    ______________________________  Llana Aliment Randa Evens, M.D.    MLY/MEDQ  D:  05/28/2006  T:  05/29/2006  Job:  [id number]   cc:   Duncan Dull, M.D.

## 2010-11-07 NOTE — Consult Note (Signed)
NAMEJOSEP, LUVIANO           ACCOUNT NO.:  [id number]   MEDICAL RECORD NO.:  [id number]          PATIENT TYPE:  INP   LOCATION:  3735                         FACILITY:  [facility]   PHYSICIAN:  Petra Kuba, M.D.    DATE OF BIRTH:  01/24/1951   DATE OF CONSULTATION:  05/22/2005  DATE OF DISCHARGE:  05/22/2005                                   CONSULTATION   HISTORY OF PRESENT ILLNESS:  The patient seen at the request of Dr. Aundria Rud  for his long standing history of Crohn's disease. He states he has had it  for 10 to 12 years. He does have a peri-rectal fistula, which sometimes gets  swollen and becomes an abscess. Will see some blood from it and occasionally  some stool. He has some very minimal lower abdominal discomfort and two  loose stools a day. His Crohn's was diagnosed by Dr. Sherin Quarry. Last  colonoscopy roughly 10 years ago he tells me and possibly was involving the  terminal ileum. He has not had any surgeries. Currently he wants to go home  and proceed with outpatient workup.   PAST MEDICAL HISTORY:  Pertinent for the Crohn's disease, hypertension,  increased triglyceride, and history of some cocaine abuse.   FAMILY HISTORY:  Pertinent for a mother with Crohn's.   ALLERGIES:  NONE.   ADMISSION MEDICATIONS:  Include aspirin, Lopressor, Prednisone, and Vicodin.   SOCIAL HISTORY:  Cocaine as above as well as alcohol and tobacco.   REVIEW OF SYSTEMS:  Pertinent for no current rashes, joint, or eye  complaints. He did have some hives but was told that was due to a detergent  and that seems to have resolved.   The medical team has also added some ciprofloxacin and Flagyl and being  admitted with atypical chest pain. Has been on some Lovenox as well.  Cardiology is seeing him. No other complaints.   PHYSICAL EXAMINATION:  VITAL SIGNS:  See chart.  GENERAL:  No acute distress.  ABDOMEN:  Soft. Essentially non-tender. No guarding or rebound. Good bowel  sounds.  RECTAL:   Inspection is pertinent for the fistula without any obvious abscess  or drainage. Examination not done. The fistula is about an inch to an inch  and a half from the anorectal junction.   LABORATORY DATA:  CT extensive reviewed with Dr. Purcell Mouton. The fistula seems  to be just localized in the anal canal on the sagittal views. No signs of  Crohn's disease on the CT or any other abnormalities.   Labs pertinent for a BUN of 22, creatinine 1.5, PT 13.4. White count 11.6,  hemoglobin 14.3. Sed rate 3. No LFT's or albumin done. He did rule out for a  MI.   ASSESSMENT:  The patient with long standing history of Crohn's disease based  on CT. Minimal at best activity with a probable fistula in ano.   PLAN:  Outpatient colonoscopy to better assess Crohn's disease. Risks,  benefits, and methods of that were discussed and I gave him my card. The  fistula does not look too bad and outpatient management of that can be done  as well. Would proceed next with probable surgical consultation. Might  consider a fistulogram. Discuss delayed x-ray's or delayed CT with Dr.  Purcell Mouton but doubt it would be yielding, although contrast from the CT today  has not gotten back to his colon and just is in the TI. Dr. Madilyn Fireman is on call  this weekend. Call him if any other question or problem.           ______________________________  Petra Kuba, M.D.     MEM/MEDQ  D:  05/22/2005  T:  05/23/2005  Job:  [id number]

## 2010-11-07 NOTE — Discharge Summary (Signed)
[name], [name]           ACCOUNT NO.:  [id number]   MEDICAL RECORD NO.:  [id number]          PATIENT TYPE:  INP   LOCATION:  3735                         FACILITY:  [facility]   PHYSICIAN:  Eliseo Gum, M.D.   DATE OF BIRTH:  [birth year]   DATE OF ADMISSION:  05/27/2006  DATE OF DISCHARGE:  05/30/2006                               DISCHARGE SUMMARY   ATTENDING PHYSICIAN:  Eliseo Gum, M.D.   DISCHARGE DIAGNOSES:  1. Crohn disease with recurrent abscess.  2. Hypertension.  3. Type 2 diabetes.  4. Atrial fibrillation history.  5. Mitral regurgitation.  6. History of hiatal hernia.  7. Bilateral cataracts.  8. Dyslipidemia.  9. Tonsillectomy.   DISCHARGE MEDICATIONS:  1. Aspirin 81 mg p.o. daily.  2. Ciprofloxacin 750 mg p.o. daily x18 days.  3. Metronidazole 500 mg p.o. b.i.d. x18 days.  4. Protonix 40 mg p.o. daily.  5. Zocor 10 mg p.o. daily.  6. Reglan 10 mg p.o. before each meal.  7. Vitamin B12 intramuscular injections, 1 gram daily x7 days, then 1      weekly x7 weeks, then 1 monthly.  8. Nitroglycerin 0.4 mg sublingual p.r.n. chest pain.   DISPOSITION AND FOLLOWUP:  During the patient's hospitalization, the  patient underwent a cardiac catheterization for chest pain.  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  On followup, his risk factors should be  modified, including cessation of cigarettes/smoking, good control of  blood pressure, control of diabetes, dietary, and regular exercise.  The  patient was also seen for his perirectal abscess versus perirectal  fistula.  Consult with Dr. Ezzard Standing, Surgery, stated that, before  considering any surgical therapy, the patient needed to be evaluated by  a gastroenterologist.  Dr. Randa Evens from Gastroenterology stated that he  would like to get a colonoscopy at some point, and this will need to be  followed up by the patient on an outpatient  basis.   PROCEDURES PERFORMED:  1. May 28, 2006, cardiac catheterization showed normal coronary      arteries with no significant coronary calcification or luminal      irregularities.  Normal left ventricular function.  2. CT of abdomen and pelvis demonstrated no acute abdominal findings.      Colonic diverticular changes are noted.  Possible chronic perianal      fistula on right not containing any air.  Because the contrast had      not passed through this area, this was suboptimally evaluated.      They suggest a fluoroscopic fistula for possible further      examination.  No evidence of intrapelvic lesion or abscess.  3. Upper GI series with small-bowel follow-through demonstrated no      small-bowel fistula, terminal ileum within normal limits,      esophageal spasm, and a large hiatal  hernia with esophageal      reflux.   CONSULTATIONS:  1. Dr. Viann Fish, Cardiology, performed a normal cardiac      catheterization.  2. Dr. Ovidio Kin, Surgery, consulted for  patient's abscess versus      fistula.  He is happy to see the patient as an outpatient once      patient has consulted a gastroenterologist.  3. Dr. Fayrene Fearing L. Edwards, Gastroenterology, recommended antibiotics for      several weeks on the current doses of Cipro and Flagyl, with I&D if      necessary.  No prednisone.  Will need colonoscopy at some point,      prefer to do this in the next several weeks.  He also ordered an      upper GI to rule out other fistulas and evaluate the status of this      current disease.   ADMITTING H&P:  [name] is a 60 year old male with a history of  Crohn disease diagnosed 12 years ago and multiple cardiac risk factors  who presented to the emergency department after being awoken in the  middle of the night with chest pain.  Please see admission H&P for  further details.   LABS ON ADMISSION:  CBC showed WBC 5.3, hemoglobin 14.1, hematocrit  41.7, platelets 271.  BMET:   Sodium 139, potassium 3.7, chloride 108,  BUN 22, glucose 117.  Venous blood gas demonstrated pH 7.47, pCO2 of  32.5, bicarbonate 23.9, base deficit 1.  First set of cardiac enzymes  negative.  BNP less than 30.  PT 12.5, INR 0.9, PTT at 26.   HOSPITAL COURSE:  Problem:  1. Chest pain.  Patient was admitted to the hospital on telemetry.      Three sets of cardiac enzymes were performed which were negative.      Serial EKGs which were done demonstrated sinus bradycardia with an      occasional PVC, but with no acute abnormalities.  The patient was      put on Lovenox, continued on aspirin.  A lipid profile was checked      which demonstrated triglycerides 200, HDL 48, VLDL 40, LDL 100.      The patient was placed on Zocor.  A cardiac consult was then      called, for which it was decided to do a cardiac catheterization      the following day.  The cardiac catheterization showed no coronary      artery disease; however, the patient's cardiologist did feel that      patient likely has hypertensive heart disease.  The patient was      counseled in risk reduction methods.  The patient to be followed by      primary care physician.  2. Perirectal abscess.  The patient states that he has a history of      Crohn disease.  There is no record of this diagnosis being made at      [facility].  The patient states that it was made 12 years      ago by a colonoscopy.  The patient was hospitalized with a similar      problem 1 year ago and was given GI outpatient followup, which he      did not keep.  A surgical consult and a GI consult were done.      According to the surgeon, he would prefer to have a      gastroenterologist evaluate the patient before any incision and      drainage is done.  The gastroenterologist continued the patient on      Cipro and Flagyl, agreed with not restarting  prednisone, and told     patient to have follow-up colonoscopy outpatient.  The patient's      small  bowel follow-through demonstrated no other sequelae of the      Crohn disease at this time.  It did suggest that the patient has a      lot of reflux, which could explain his chest pain (see problem #1).  3. Hypertension.  Patient was slightly hypertensive on admission.  His      metoprolol home dose was continued, but then he became very      bradycardiac.  It was discontinued.  Patient was not sent home on      antihypertensives.  Given his history of atrial fibrillation and      hypertension, the patient will need to be placed on another agent      or be placed on metoprolol (home dose 50 mg p.o. b.i.d.).  The      patient's TSH was normal at 3.650, which does not explain his chest      pain.  4. Type 2 diabetes.  The patient was on no therapy on admission to      hospital.  His hemoglobin Alc was 5.7.  The patient likely does not      need medication for his diabetes at this point.  It can be managed      with diet.  Will need to follow up as an outpatient.   DISCHARGE LABORATORIES:  BMET:  Sodium 139, potassium 4.6, chloride 104,  bicarb 28, glucose 99, BUN 17, creatinine 1.2, calcium 8.9.  CBC:  WBC  5.4, hemoglobin 13.3, hematocrit 38.7, platelets 238.      [place], MD  Electronically Signed     ______________________________  Eliseo Gum, M.D.    SP/MEDQ  D:  05/31/2006  T:  06/01/2006  Job:  [id number]   cc:   Fayrene Fearing L. Malon Kindle., M.D.  Georga Hacking, M.D.  Sandria Bales. Ezzard Standing, M.D.

## 2011-03-23 LAB — GLUCOSE, CAPILLARY
Glucose-Capillary: 113 — ABNORMAL HIGH
Glucose-Capillary: 129 — ABNORMAL HIGH
Glucose-Capillary: 137 — ABNORMAL HIGH
Glucose-Capillary: 173 — ABNORMAL HIGH

## 2011-03-23 LAB — CBC
HCT: 39.4
HCT: 44
Hemoglobin: 13.7
Hemoglobin: 14.6
MCV: 89.1
MCV: 90.4
Platelets: 242
Platelets: 253
RBC: 4.87
RDW: 13.6
WBC: 4.6
WBC: 5

## 2011-03-23 LAB — CARDIAC PANEL(CRET KIN+CKTOT+MB+TROPI)
Relative Index: 0.8
Troponin I: 0.01

## 2011-03-23 LAB — COMPREHENSIVE METABOLIC PANEL
Albumin: 3.6
BUN: 19
Creatinine, Ser: 1.07
Total Protein: 6.8

## 2011-03-23 LAB — MAGNESIUM: Magnesium: 2.2

## 2011-03-23 LAB — DIFFERENTIAL
Eosinophils Absolute: 0.1
Eosinophils Relative: 2
Lymphs Abs: 1.5
Monocytes Relative: 5
Neutrophils Relative %: 62

## 2011-03-23 LAB — POCT I-STAT, CHEM 8
Creatinine, Ser: 1.2
Glucose, Bld: 170 — ABNORMAL HIGH
Hemoglobin: 15.3
Potassium: 3.8

## 2011-03-23 LAB — CK TOTAL AND CKMB (NOT AT ARMC)
CK, MB: 1.7
CK, MB: 1.9
Relative Index: 0.8
Relative Index: 1.1
Relative Index: 1.2
Total CK: 206

## 2011-03-23 LAB — LIPID PANEL
Total CHOL/HDL Ratio: 6.1
Triglycerides: 539 — ABNORMAL HIGH
VLDL: UNDETERMINED

## 2011-03-23 LAB — TROPONIN I: Troponin I: 0.01

## 2011-03-23 LAB — POCT CARDIAC MARKERS: Troponin i, poc: <0.05

## 2011-03-23 LAB — PROTIME-INR: Prothrombin Time: 12.2

## 2011-03-23 LAB — TSH: TSH: 2.597

## 2011-03-23 LAB — B-NATRIURETIC PEPTIDE (CONVERTED LAB): Pro B Natriuretic peptide (BNP): 30

## 2011-03-23 LAB — BASIC METABOLIC PANEL
BUN: 16
Chloride: 103
Creatinine, Ser: 1.03
Glucose, Bld: 125 — ABNORMAL HIGH

## 2011-03-23 LAB — HEMOGLOBIN A1C: Hgb A1c MFr Bld: 6.3 — ABNORMAL HIGH

## 2011-03-23 LAB — PHOSPHORUS: Phosphorus: 3.4

## 2011-04-02 LAB — DIFFERENTIAL
Basophils Absolute: 0
Basophils Relative: 1
Lymphocytes Relative: 31
Monocytes Absolute: 0.3
Neutro Abs: 2.6
Neutrophils Relative %: 59

## 2011-04-02 LAB — I-STAT 8, (EC8 V) (CONVERTED LAB)
Acid-Base Excess: 1
Bicarbonate: 24.3 — ABNORMAL HIGH
Glucose, Bld: 121 — ABNORMAL HIGH
Hemoglobin: 15
Potassium: 4.1
Sodium: 139
TCO2: 25

## 2011-04-02 LAB — SEDIMENTATION RATE: Sed Rate: 10

## 2011-04-02 LAB — CBC
Hemoglobin: 13.6
MCHC: 33.9
Platelets: 262
RDW: 13.8

## 2013-01-11 ENCOUNTER — Emergency Department (INDEPENDENT_AMBULATORY_CARE_PROVIDER_SITE_OTHER)
Admission: EM | Admit: 2013-01-11 | Discharge: 2013-01-11 | Disposition: A | Discharge status: Home / Self Care | Payer: BC Managed Care – PPO | Source: Home / Self Care | Attending: Emergency Medicine | Admitting: Emergency Medicine

## 2013-01-11 ENCOUNTER — Emergency Department (INDEPENDENT_AMBULATORY_CARE_PROVIDER_SITE_OTHER): Payer: BC Managed Care – PPO

## 2013-01-11 ENCOUNTER — Encounter (HOSPITAL_COMMUNITY): Payer: Self-pay | Admitting: *Deleted

## 2013-01-11 DIAGNOSIS — L723 Sebaceous cyst: Secondary | ICD-10-CM

## 2013-01-11 DIAGNOSIS — K0401 Reversible pulpitis: Secondary | ICD-10-CM

## 2013-01-11 DIAGNOSIS — B340 Adenovirus infection, unspecified: Secondary | ICD-10-CM

## 2013-01-11 HISTORY — DX: Type 2 diabetes mellitus without complications: E11.9

## 2013-01-11 HISTORY — DX: Essential (primary) hypertension: I10

## 2013-01-11 LAB — GLUCOSE, CAPILLARY: Glucose-Capillary: 106 mg/dL — ABNORMAL HIGH (ref 70–99)

## 2013-01-11 MED ORDER — HYDROCODONE-ACETAMINOPHEN 5-325 MG PO TABS: ORAL_TABLET | ORAL | Status: DC

## 2013-01-11 MED ORDER — TOBRAMYCIN 0.3 % OP SOLN: 1.0000 [drp] | OPHTHALMIC | Status: DC

## 2013-01-11 MED ORDER — PENICILLIN V POTASSIUM 500 MG PO TABS: 500.0000 mg | ORAL_TABLET | Freq: Three times a day (TID) | ORAL | Status: DC

## 2013-01-11 MED ORDER — BENZONATATE 200 MG PO CAPS: 200.0000 mg | ORAL_CAPSULE | Freq: Three times a day (TID) | ORAL | Status: DC | PRN

## 2013-01-11 MED ORDER — METRONIDAZOLE 500 MG PO TABS: 500.0000 mg | ORAL_TABLET | Freq: Three times a day (TID) | ORAL | Status: DC

## 2013-01-11 MED ORDER — MELOXICAM 15 MG PO TABS: 15.0000 mg | ORAL_TABLET | Freq: Every day | ORAL | Status: DC

## 2013-01-11 MED ORDER — LISINOPRIL 10 MG PO TABS: 10.0000 mg | ORAL_TABLET | Freq: Every day | ORAL | Status: DC

## 2013-01-11 NOTE — ED Provider Notes (Signed)
Chief Complaint:   Chief Complaint  Patient presents with  . Dental Pain    History of Present Illness:   [name] is a 62 year old male who comes in today with multiple issues including a painful, broken, right second upper incisor, sore throat, cyst on his back, redness of his eyes, hypertension, and diabetes. He has been followed at [facility], but does not have any medication and has received no followup since they closed last fall.  He broke his right, upper, second incisor months ago, but over the past 2 days it's been painful and hurts to chew on that tooth. He denies any swelling of the gingiva or difficulty in opening his mouth.  The past 3 days he's had sore throat, nasal congestion, rhinorrhea, cough productive yellow sputum, night sweats, and today he has a red right eye with yellowish discharge and slight blurring of his vision.  He also has had a cyst on his back for years. It's been draining foul-smelling, cheesy debris. It feels a little bit tender and irritated.  He has a history of high blood pressure and diabetes. His diabetes is diet controlled. Denies polyuria, polydipsia, or blurry vision. He is not taking anything for his blood pressure right now. No headaches, dizziness, chest pain, or shortness of breath.  Review of Systems:  Other than noted above, the patient denies any of the following symptoms: Systemic:  No fever, chills,  Or sweats. ENT:  No headache, ear ache, sore throat, nasal congestion, facial pain, or swelling. Lymphatic:  No adenopathy. Lungs:  No coughing, wheezing or shortness of breath.  PMFSH:  Past medical history, family history, social history, meds, and allergies were reviewed.   Physical Exam:   Vital signs:  BP 176/94  Pulse 86  Temp(Src) 98.7 F (37.1 C) (Oral)  Resp 20  SpO2 100% General:  Alert, oriented, in no distress. Eyes: There is conjunctival injection of the right eye with some purulent  drainage. ENT:  TMs and canals normal.  Nasal mucosa normal. Mouth exam:  Teeth are in poor repair. The right, upper, second incisor is decayed, I can tell it is broken there is no gingival swelling. The pharynx is clear, no swelling of the floor the mouth. Neck:  No swelling or adenopathy. Lungs:  Breath sounds clear and equal bilaterally.  No wheezes, rales or rhonchi. Heart:  Regular rhythm.  No gallops or murmers. Skin:  Clear, warm and dry. He has a sebaceous cyst on his upper back that does not appear to be inflamed. I was able to express some malodorous, cheesy debris.  Results for orders placed during the hospital encounter of 01/11/13  GLUCOSE, CAPILLARY      Result Value Range   Glucose-Capillary 106 (*) 70 - 99 mg/dL   Dg Chest 2 View  0/98/1191   *RADIOLOGY REPORT*  Clinical Data: Productive cough and night sweats for 3 days, history atrial fibrillation, hypertension, diabetes  CHEST - 2 VIEW  Comparison: 07/27/2010  Findings: Borderline enlargement of cardiac silhouette. Tortuous aorta. Pulmonary vascularity normal. Linear scarring left base. Lungs otherwise clear. No pleural effusion or pneumothorax. Minimal chronic peribronchial thickening stable. No acute osseous findings.  IMPRESSION: Borderline enlargement of cardiac silhouette. No acute abnormalities.   Original Report Authenticated By: Ulyses Southward, M.D.   Assessment:  The primary encounter diagnosis was Pulpitis. Diagnoses of Adenoviral infection and Sebaceous cyst were also pertinent to this visit.  He needs to see a dentist as soon as possible.  There is no evidence of tissue space infection in the floor the mouth. It's likely that the sore throat and red right eye are both due to adenoviral infection. His throat appears normal. He was given some Tobrex eyedrops for the purulent drainage from the eye. Finally he needs followup for his high blood pressure and was given a refill on his lisinopril. He was given the name of the  [facility] for follow up.  Also, he'll need to see a dermatologist for removal of a sebaceous cyst.  Plan:   1.  The following meds were prescribed:   Discharge Medication List as of 01/11/2013  4:20 PM    START taking these medications   Details  benzonatate (TESSALON) 200 MG capsule Take 1 capsule (200 mg total) by mouth 3 (three) times daily as needed for cough., Starting 01/11/2013, Until Discontinued, Normal    HYDROcodone-acetaminophen (NORCO/VICODIN) 5-325 MG per tablet 1 to 2 tabs every 4 to 6 hours as needed for pain., Print    lisinopril (PRINIVIL) 10 MG tablet Take 1 tablet (10 mg total) by mouth daily., Starting 01/11/2013, Until Discontinued, Normal    meloxicam (MOBIC) 15 MG tablet Take 1 tablet (15 mg total) by mouth daily., Starting 01/11/2013, Until Discontinued, Normal    metroNIDAZOLE (FLAGYL) 500 MG tablet Take 1 tablet (500 mg total) by mouth 3 (three) times daily., Starting 01/11/2013, Until Discontinued, Normal    penicillin v potassium (VEETID) 500 MG tablet Take 1 tablet (500 mg total) by mouth 3 (three) times daily., Starting 01/11/2013, Until Discontinued, Normal    tobramycin (TOBREX) 0.3 % ophthalmic solution Place 1 drop into the right eye every 4 (four) hours., Starting 01/11/2013, Until Discontinued, Normal       2.  The patient was instructed in symptomatic care and handouts were given.  Suggested sleeping with head of bed elevated and hot salt water mouthwash. 3.  The patient was told to return if becoming worse in any way, if no better in 3 or 4 days, and given some red flag symptoms such as fever or worsening pain that would indicate earlier return, especially difficulty breathing. 4.  The patient was told to follow up with a dentist as soon as possible.    Reuben Likes, MD 01/11/13 [phone]

## 2013-01-11 NOTE — ED Notes (Signed)
Pt reports he has a broken tooth on the right upper gum the past 2 days    He also c/o sore throat X 2 days and right eye redness X 1 day.   He has had night sweats the past 3 days

## 2014-05-01 ENCOUNTER — Emergency Department (HOSPITAL_COMMUNITY): Payer: Self-pay

## 2014-05-01 ENCOUNTER — Inpatient Hospital Stay (HOSPITAL_COMMUNITY): Payer: BC Managed Care – PPO

## 2014-05-01 ENCOUNTER — Inpatient Hospital Stay (HOSPITAL_COMMUNITY)
Admission: EM | Admit: 2014-05-01 | Discharge: 2014-05-03 | DRG: 065 | Disposition: A | Discharge status: Home / Self Care | Payer: Self-pay | Attending: Internal Medicine | Admitting: Internal Medicine

## 2014-05-01 ENCOUNTER — Encounter (HOSPITAL_COMMUNITY): Payer: Self-pay | Admitting: *Deleted

## 2014-05-01 DIAGNOSIS — I480 Paroxysmal atrial fibrillation: Secondary | ICD-10-CM | POA: Diagnosis present

## 2014-05-01 DIAGNOSIS — F1410 Cocaine abuse, uncomplicated: Secondary | ICD-10-CM | POA: Diagnosis present

## 2014-05-01 DIAGNOSIS — E785 Hyperlipidemia, unspecified: Secondary | ICD-10-CM | POA: Diagnosis present

## 2014-05-01 DIAGNOSIS — F17210 Nicotine dependence, cigarettes, uncomplicated: Secondary | ICD-10-CM | POA: Diagnosis present

## 2014-05-01 DIAGNOSIS — Z79899 Other long term (current) drug therapy: Secondary | ICD-10-CM

## 2014-05-01 DIAGNOSIS — Z9119 Patient's noncompliance with other medical treatment and regimen: Secondary | ICD-10-CM | POA: Diagnosis present

## 2014-05-01 DIAGNOSIS — E1165 Type 2 diabetes mellitus with hyperglycemia: Secondary | ICD-10-CM | POA: Diagnosis present

## 2014-05-01 DIAGNOSIS — I340 Nonrheumatic mitral (valve) insufficiency: Secondary | ICD-10-CM | POA: Diagnosis present

## 2014-05-01 DIAGNOSIS — E1159 Type 2 diabetes mellitus with other circulatory complications: Secondary | ICD-10-CM | POA: Insufficient documentation

## 2014-05-01 DIAGNOSIS — I639 Cerebral infarction, unspecified: Principal | ICD-10-CM | POA: Diagnosis present

## 2014-05-01 DIAGNOSIS — I739 Peripheral vascular disease, unspecified: Secondary | ICD-10-CM | POA: Diagnosis present

## 2014-05-01 DIAGNOSIS — Z79891 Long term (current) use of opiate analgesic: Secondary | ICD-10-CM

## 2014-05-01 DIAGNOSIS — I6340 Cerebral infarction due to embolism of unspecified cerebral artery: Secondary | ICD-10-CM

## 2014-05-01 DIAGNOSIS — I69354 Hemiplegia and hemiparesis following cerebral infarction affecting left non-dominant side: Secondary | ICD-10-CM | POA: Diagnosis present

## 2014-05-01 DIAGNOSIS — M1990 Unspecified osteoarthritis, unspecified site: Secondary | ICD-10-CM | POA: Diagnosis present

## 2014-05-01 DIAGNOSIS — E669 Obesity, unspecified: Secondary | ICD-10-CM | POA: Diagnosis present

## 2014-05-01 DIAGNOSIS — Z7982 Long term (current) use of aspirin: Secondary | ICD-10-CM

## 2014-05-01 DIAGNOSIS — F1411 Cocaine abuse, in remission: Secondary | ICD-10-CM | POA: Insufficient documentation

## 2014-05-01 DIAGNOSIS — I10 Essential (primary) hypertension: Secondary | ICD-10-CM | POA: Diagnosis present

## 2014-05-01 DIAGNOSIS — Z8673 Personal history of transient ischemic attack (TIA), and cerebral infarction without residual deficits: Secondary | ICD-10-CM

## 2014-05-01 DIAGNOSIS — G8194 Hemiplegia, unspecified affecting left nondominant side: Secondary | ICD-10-CM | POA: Diagnosis present

## 2014-05-01 DIAGNOSIS — I4891 Unspecified atrial fibrillation: Secondary | ICD-10-CM | POA: Insufficient documentation

## 2014-05-01 DIAGNOSIS — Z6828 Body mass index (BMI) 28.0-28.9, adult: Secondary | ICD-10-CM

## 2014-05-01 HISTORY — DX: Unspecified atrial fibrillation: I48.91

## 2014-05-01 LAB — I-STAT CHEM 8, ED
BUN: 28 mg/dL — AB (ref 6–23)
CALCIUM ION: 1.12 mmol/L — AB (ref 1.13–1.30)
Chloride: 102 mEq/L (ref 96–112)
Creatinine, Ser: 1.1 mg/dL (ref 0.50–1.35)
Glucose, Bld: 281 mg/dL — ABNORMAL HIGH (ref 70–99)
HCT: 40 % (ref 39.0–52.0)
HEMOGLOBIN: 13.6 g/dL (ref 13.0–17.0)
Potassium: 4.2 mEq/L (ref 3.7–5.3)
Sodium: 136 mEq/L — ABNORMAL LOW (ref 137–147)
TCO2: 24 mmol/L (ref 0–100)

## 2014-05-01 LAB — CBC
HCT: 36.7 % — ABNORMAL LOW (ref 39.0–52.0)
HEMOGLOBIN: 13.1 g/dL (ref 13.0–17.0)
MCH: 31 pg (ref 26.0–34.0)
MCHC: 35.7 g/dL (ref 30.0–36.0)
MCV: 86.8 fL (ref 78.0–100.0)
Platelets: 219 10*3/uL (ref 150–400)
RBC: 4.23 MIL/uL (ref 4.22–5.81)
RDW: 13.4 % (ref 11.5–15.5)
WBC: 4.8 10*3/uL (ref 4.0–10.5)

## 2014-05-01 LAB — COMPREHENSIVE METABOLIC PANEL
ALT: 12 U/L (ref 0–53)
ANION GAP: 14 (ref 5–15)
AST: 22 U/L (ref 0–37)
Albumin: 3.6 g/dL (ref 3.5–5.2)
Alkaline Phosphatase: 78 U/L (ref 39–117)
BUN: 25 mg/dL — ABNORMAL HIGH (ref 6–23)
CO2: 23 mEq/L (ref 19–32)
CREATININE: 1.06 mg/dL (ref 0.50–1.35)
Calcium: 8.8 mg/dL (ref 8.4–10.5)
Chloride: 98 mEq/L (ref 96–112)
GFR calc Af Amer: 85 mL/min — ABNORMAL LOW (ref >90)
GFR, EST NON AFRICAN AMERICAN: 73 mL/min — AB (ref >90)
GLUCOSE: 263 mg/dL — AB (ref 70–99)
Potassium: 4.5 mEq/L (ref 3.7–5.3)
SODIUM: 135 meq/L — AB (ref 137–147)
TOTAL PROTEIN: 7 g/dL (ref 6.0–8.3)
Total Bilirubin: 0.2 mg/dL — ABNORMAL LOW (ref 0.3–1.2)

## 2014-05-01 LAB — DIFFERENTIAL
Basophils Absolute: 0 10*3/uL (ref 0.0–0.1)
Basophils Relative: 0 % (ref 0–1)
EOS ABS: 0 10*3/uL (ref 0.0–0.7)
EOS PCT: 1 % (ref 0–5)
LYMPHS PCT: 35 % (ref 12–46)
Lymphs Abs: 1.7 10*3/uL (ref 0.7–4.0)
MONO ABS: 0.3 10*3/uL (ref 0.1–1.0)
MONOS PCT: 5 % (ref 3–12)
Neutro Abs: 2.8 10*3/uL (ref 1.7–7.7)
Neutrophils Relative %: 59 % (ref 43–77)

## 2014-05-01 LAB — I-STAT TROPONIN, ED: Troponin i, poc: 0.01 ng/mL (ref 0.00–0.08)

## 2014-05-01 LAB — APTT: aPTT: 27 seconds (ref 24–37)

## 2014-05-01 LAB — PROTIME-INR
INR: 0.88 (ref 0.00–1.49)
PROTHROMBIN TIME: 12 s (ref 11.6–15.2)

## 2014-05-01 MED ORDER — ASPIRIN EC 81 MG PO TBEC
81.0000 mg | DELAYED_RELEASE_TABLET | Freq: Every day | ORAL | Status: DC
  Administered 2014-05-01: 81 mg via ORAL
  Filled 2014-05-01: qty 1

## 2014-05-01 MED ORDER — STROKE: EARLY STAGES OF RECOVERY BOOK
Freq: Once | Status: AC
  Administered 2014-05-02: 04:00:00
  Filled 2014-05-01: qty 1

## 2014-05-01 MED ORDER — ASPIRIN EC 325 MG PO TBEC
325.0000 mg | DELAYED_RELEASE_TABLET | Freq: Every day | ORAL | Status: DC
  Administered 2014-05-02 – 2014-05-03 (×2): 325 mg via ORAL
  Filled 2014-05-01 (×2): qty 1

## 2014-05-01 MED ORDER — INSULIN ASPART 100 UNIT/ML ~~LOC~~ SOLN
0.0000 [IU] | SUBCUTANEOUS | Status: DC
  Administered 2014-05-02: 2 [IU] via SUBCUTANEOUS
  Administered 2014-05-02: 3 [IU] via SUBCUTANEOUS
  Administered 2014-05-02: 2 [IU] via SUBCUTANEOUS
  Administered 2014-05-02: 3 [IU] via SUBCUTANEOUS
  Administered 2014-05-02: 2 [IU] via SUBCUTANEOUS
  Administered 2014-05-03 (×3): 1 [IU] via SUBCUTANEOUS

## 2014-05-01 MED ORDER — LISINOPRIL 10 MG PO TABS
10.0000 mg | ORAL_TABLET | Freq: Every day | ORAL | Status: DC
  Administered 2014-05-02 – 2014-05-03 (×2): 10 mg via ORAL
  Filled 2014-05-01 (×2): qty 1

## 2014-05-01 MED ORDER — SENNOSIDES-DOCUSATE SODIUM 8.6-50 MG PO TABS
1.0000 | ORAL_TABLET | Freq: Every evening | ORAL | Status: DC | PRN
  Filled 2014-05-01: qty 1

## 2014-05-01 MED ORDER — ENOXAPARIN SODIUM 30 MG/0.3ML ~~LOC~~ SOLN
30.0000 mg | SUBCUTANEOUS | Status: DC
  Administered 2014-05-02 – 2014-05-03 (×2): 30 mg via SUBCUTANEOUS
  Filled 2014-05-01 (×2): qty 0.3

## 2014-05-01 NOTE — ED Notes (Signed)
Patient returning from [facility]

## 2014-05-01 NOTE — ED Notes (Signed)
Patient started experiencing left sided weakness at 1530 today, pt still with left arm drift and left leg drift,

## 2014-05-01 NOTE — H&P (Addendum)
Hospitalist Admission History and Physical  Patient name: [name] record number: [id number] Date of birth: [birth year] Age: 63 y.o. Gender: male  Primary Care Provider: Mack Hook, MD  Chief Complaint: CVA  History of Present Illness:This is a 63 y.o. year old male with significant past medical history of HTN, tobacco abuse, type 2 DM, cocaine abuse, ? Remote hx/o afib  presenting with CVA. Pt states that he was out at lunch today around 2pm. Pt states that around 3pm, pt developed progressive onset of L sided weakness tingling. Sxs persisted for approx 30 mins. Pt was able to drive home, but when he got home he fell. He notified his wife about sxs and was directed to ER. Pt reports prior sxs approx 2 weeks ago that self resolved. Denied any prior hx/o CVA in the past. Still smoking daily. Denies any ETOH abuse. Does not report any recent cocaine abuse. States that he takes a baby ASA daily. Reports non compliance to lisinopril.  Presented to ER afebrile, BP 160s/90s-100s, Satting >98% on RA. WBC 4.8, Hgb 13.1, Na 135, Cr 1.06, Glu 263. Head CT WNL. MRI/MRA brain Acute subcentimeter RIGHT basal ganglia/ corona radiata infarct. Multiple remote bilateral basal ganglia and RIGHT thalamus lacunar infarcts in a background of moderate to severe white matter changes suggesting chronic small vessel ischemic disease. Neuro consulted.   Assessment and Plan: [name] is a 63 y.o. year old male presenting with CVA   Active Problems:   CVA (cerebral infarction)   1- CVA  - full dose ASA -appreciate neuro recs  -head CT, MRI/MRA completed  -2D ECHO -carotid dopplers  -risk stratification labs  -UDS  2- HTN -elevated BP on presentation -restart low dose lisinopril  -avoid > 25% decrease in baseline BP   3- type 2 DM -SSI  -A1C  4- ? Hx/o Afib -pt reports remote hx/o this in the past  -not on anticoagulation or rate controlling agent -EKG x 1 to assess   -tele bed -cards consult as clinically indicated   FEN/GI: heart healthy-carb modified diet  Prophylaxis: lovenox  Disposition: pending further evaluation  Code Status:Full Code    Patient Active Problem List   Diagnosis Date Noted  . CVA (cerebral infarction) 05/01/2014  . GASTROENTERITIS 09/02/2010  . 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 INGUINAL PAIN, BILATERAL 07/17/2010  . CHEST PAIN 03/07/2010  . DEGENERATIVE JOINT DISEASE, CERVICAL SPINE 04/24/2009  . NECK PAIN 04/11/2009  . ABSCESS OF ANAL AND RECTAL REGIONS 07/15/2007  . FISTULA, ANAL 01/27/2007  . DIABETES MELLITUS, TYPE II 07/03/2006  . HYPERLIPIDEMIA 07/03/2006  . TOBACCO ABUSE 07/03/2006  . FIBRILLATION, ATRIAL 07/03/2006  . HIATAL HERNIA WITH REFLUX 07/03/2006  . COCAINE ABUSE 06/08/2006  . HYPERTENSION 06/08/2006   Past Medical History: Past Medical History  Diagnosis Date  . Hypertension   . Diabetes mellitus without complication   . Atrial fibrillation     Past Surgical History: History reviewed. No pertinent past surgical history.  Social History: History   Social History  . Marital Status: Married    Spouse Name: N/A    Number of Children: N/A  . Years of Education: N/A   Social History Main Topics  . Smoking status: Current Every Day Smoker    Types: Cigarettes  . Smokeless tobacco: None  . Alcohol Use: No  . Drug Use: No  . Sexual Activity: None   Other Topics Concern  . None   Social History Narrative    Family History:  No family history on file.  Allergies: No Known Allergies  Current Facility-Administered Medications  Medication Dose Route Frequency Provider Last Rate Last Dose  .  stroke: mapping our early stages of recovery book   Does not apply Once Shanda Howells, MD      . aspirin EC tablet 81 mg  81 mg Oral Daily Amie Portland, MD   81 mg at 05/01/14 2228  . [START ON 05/02/2014] enoxaparin (LOVENOX) injection 30 mg  30 mg Subcutaneous Q24H Shanda Howells,  MD      . Derrill Memo ON 05/02/2014] lisinopril (PRINIVIL,ZESTRIL) tablet 10 mg  10 mg Oral Daily Shanda Howells, MD      . senna-docusate (Senokot-S) tablet 1 tablet  1 tablet Oral QHS PRN Shanda Howells, MD       Current Outpatient Prescriptions  Medication Sig Dispense Refill  . aspirin 81 MG tablet Take 81 mg by mouth daily.    . benzonatate (TESSALON) 200 MG capsule Take 1 capsule (200 mg total) by mouth 3 (three) times daily as needed for cough. (Patient not taking: Reported on 05/01/2014) 30 capsule 0  . HYDROcodone-acetaminophen (NORCO/VICODIN) 5-325 MG per tablet 1 to 2 tabs every 4 to 6 hours as needed for pain. (Patient not taking: Reported on 05/01/2014) 20 tablet 0  . lisinopril (PRINIVIL) 10 MG tablet Take 1 tablet (10 mg total) by mouth daily. (Patient not taking: Reported on 05/01/2014) 30 tablet 2  . meloxicam (MOBIC) 15 MG tablet Take 1 tablet (15 mg total) by mouth daily. (Patient not taking: Reported on 05/01/2014) 15 tablet 0  . metroNIDAZOLE (FLAGYL) 500 MG tablet Take 1 tablet (500 mg total) by mouth 3 (three) times daily. (Patient not taking: Reported on 05/01/2014) 30 tablet 0  . naproxen sodium (ANAPROX) 220 MG tablet Take 220 mg by mouth 2 (two) times daily with a meal.    . penicillin v potassium (VEETID) 500 MG tablet Take 1 tablet (500 mg total) by mouth 3 (three) times daily. (Patient not taking: Reported on 05/01/2014) 30 tablet 0  . tobramycin (TOBREX) 0.3 % ophthalmic solution Place 1 drop into the right eye every 4 (four) hours. (Patient not taking: Reported on 05/01/2014) 5 mL 0   Review Of Systems: 12 point ROS negative except as noted above in HPI.  Physical Exam: Filed Vitals:   05/01/14 2225  BP:   Pulse:   Temp: 98.4 F (36.9 C)  Resp:     General: alert and cooperative HEENT: PERRLA and extra ocular movement intact Heart: S1, S2 normal, no murmur, rub or gallop, regular rate and rhythm Lungs: clear to auscultation, no wheezes or rales and unlabored  breathing Abdomen: abdomen is soft without significant tenderness, masses, organomegaly or guarding Extremities: extremities normal, atraumatic, no cyanosis or edema Skin:no rashes Neurology: mild L sided weakness and decreased sensation   Labs and Imaging: Lab Results  Component Value Date/Time   NA 136* 05/01/2014 07:56 PM   K 4.2 05/01/2014 07:56 PM   CL 102 05/01/2014 07:56 PM   CO2 23 05/01/2014 07:46 PM   BUN 28* 05/01/2014 07:56 PM   CREATININE 1.10 05/01/2014 07:56 PM   GLUCOSE 281* 05/01/2014 07:56 PM   Lab Results  Component Value Date   WBC 4.8 05/01/2014   HGB 13.6 05/01/2014   HCT 40.0 05/01/2014   MCV 86.8 05/01/2014   PLT 219 05/01/2014    Ct Head Wo Contrast  05/01/2014   CLINICAL DATA:  Code stroke, left-sided weakness  EXAM:  CT HEAD WITHOUT CONTRAST  TECHNIQUE: Contiguous axial images were obtained from the base of the skull through the vertex without intravenous contrast.  COMPARISON:  None.  FINDINGS: There is no evidence of mass effect, midline shift, or extra-axial fluid collections. There is no evidence of a space-occupying lesion or intracranial hemorrhage. There is no evidence of a cortical-based area of acute infarction. There are bilateral old basal ganglia lacunar infarcts. There is periventricular white matter low attenuation likely secondary to microangiopathy.  The ventricles and sulci are appropriate for the patient's age. The basal cisterns are patent.  Visualized portions of the orbits are unremarkable. There is not osteoma in the anterior right ethmoid sinus. There is a left mastoid effusion. Cerebrovascular atherosclerotic calcifications are noted.  The osseous structures are unremarkable.  IMPRESSION: No acute intracranial pathology.  These results were called by telephone at the time of interpretation on 05/01/2014 at 8:00 pm to Dr. Aram Beecham, who verbally acknowledged these results.   Electronically Signed   By: Kathreen Devoid   On: 05/01/2014 20:01    Mr Jodene Nam Head Wo Contrast  05/01/2014   CLINICAL DATA:  Acute onset LEFT arm and leg stiffness and gait imbalance 2 day well at work. Mild headache.  EXAM: MRI HEAD WITHOUT CONTRAST  MRA HEAD WITHOUT CONTRAST  TECHNIQUE: Multiplanar, multiecho pulse sequences of the brain and surrounding structures were obtained without intravenous contrast. Angiographic images of the head were obtained using MRA technique without contrast.  COMPARISON:  CT of the head May 01, 2014 at 1942 hr  FINDINGS: MRI HEAD FINDINGS  7 mm ovoid focus of reduced diffusion within the RIGHT corona radiata extending to the ipsilateral putaminal. Corresponding low ADC values. Subcentimeter bilateral remote cystic basal ganglia lacunar infarcts. Subcentimeter remote medial RIGHT thalamus lacunar infarct. Patchy to confluent supratentorial white matter FLAIR T2 hyperintensities seen. The ventricles and sulci are overall normal for patient's age. No midline shift or mass effect. Scattered punctate foci of susceptibility artifact within the thalamus as well as periphery of the supratentorial brain.  No abnormal extra-axial fluid collections. LEFT mastoid effusion. Paranasal sinuses are are well-aerated. Ocular globes and orbital contents are nonsuspicious though not tailored for evaluation. No abnormal sellar expansion. No cerebellar tonsillar ectopia. No suspicious calvarial bone marrow signal.  MRA HEAD FINDINGS  Mild motion degraded examination.  Anterior circulation: Normal flow related enhancement of the included cervical, petrous, cavernous and supra clinoid internal carotid arteries. Patent anterior communicating artery. Normal flow related enhancement of the anterior and middle cerebral arteries, including more distal segments.  Posterior circulation: Codominant vertebral arteries. Basilar artery is patent, with normal flow related enhancement of the main branch vessels. Normal flow related enhancement of the posterior cerebral  arteries.  No large vessel occlusion, hemodynamically significant stenosis, aneurysm within the anterior nor posterior circulation.  Mild luminal irregularity of the central vessels.  IMPRESSION: MRI HEAD: Acute subcentimeter RIGHT basal ganglia/ corona radiata infarct.  Multiple remote bilateral basal ganglia and RIGHT thalamus lacunar infarcts in a background of moderate to severe white matter changes suggesting chronic small vessel ischemic disease.  Scattered punctate foci of susceptibility artifact in a pattern suggesting sequelae of chronic hypertension.  LEFT mastoid effusion.  MRA HEAD: No acute vascular process or hemodynamically significant stenosis.  Mild luminal irregularity of the central vessels can be seen with atherosclerosis.  Acute findings discussed with and reconfirmed by Dr.OSVALDO CAMILO on 05/01/2014 at 9:49 pm.   Electronically Signed   By: Elon Alas   On:  05/01/2014 21:47   Mr Brain Wo Contrast  05/01/2014   CLINICAL DATA:  Acute onset LEFT arm and leg stiffness and gait imbalance 2 day well at work. Mild headache.  EXAM: MRI HEAD WITHOUT CONTRAST  MRA HEAD WITHOUT CONTRAST  TECHNIQUE: Multiplanar, multiecho pulse sequences of the brain and surrounding structures were obtained without intravenous contrast. Angiographic images of the head were obtained using MRA technique without contrast.  COMPARISON:  CT of the head May 01, 2014 at 1942 hr  FINDINGS: MRI HEAD FINDINGS  7 mm ovoid focus of reduced diffusion within the RIGHT corona radiata extending to the ipsilateral putaminal. Corresponding low ADC values. Subcentimeter bilateral remote cystic basal ganglia lacunar infarcts. Subcentimeter remote medial RIGHT thalamus lacunar infarct. Patchy to confluent supratentorial white matter FLAIR T2 hyperintensities seen. The ventricles and sulci are overall normal for patient's age. No midline shift or mass effect. Scattered punctate foci of susceptibility artifact within the  thalamus as well as periphery of the supratentorial brain.  No abnormal extra-axial fluid collections. LEFT mastoid effusion. Paranasal sinuses are are well-aerated. Ocular globes and orbital contents are nonsuspicious though not tailored for evaluation. No abnormal sellar expansion. No cerebellar tonsillar ectopia. No suspicious calvarial bone marrow signal.  MRA HEAD FINDINGS  Mild motion degraded examination.  Anterior circulation: Normal flow related enhancement of the included cervical, petrous, cavernous and supra clinoid internal carotid arteries. Patent anterior communicating artery. Normal flow related enhancement of the anterior and middle cerebral arteries, including more distal segments.  Posterior circulation: Codominant vertebral arteries. Basilar artery is patent, with normal flow related enhancement of the main branch vessels. Normal flow related enhancement of the posterior cerebral arteries.  No large vessel occlusion, hemodynamically significant stenosis, aneurysm within the anterior nor posterior circulation.  Mild luminal irregularity of the central vessels.  IMPRESSION: MRI HEAD: Acute subcentimeter RIGHT basal ganglia/ corona radiata infarct.  Multiple remote bilateral basal ganglia and RIGHT thalamus lacunar infarcts in a background of moderate to severe white matter changes suggesting chronic small vessel ischemic disease.  Scattered punctate foci of susceptibility artifact in a pattern suggesting sequelae of chronic hypertension.  LEFT mastoid effusion.  MRA HEAD: No acute vascular process or hemodynamically significant stenosis.  Mild luminal irregularity of the central vessels can be seen with atherosclerosis.  Acute findings discussed with and reconfirmed by Dr.OSVALDO CAMILO on 05/01/2014 at 9:49 pm.   Electronically Signed   By: Elon Alas   On: 05/01/2014 21:47           Shanda Howells MD  Pager: [phone]

## 2014-05-01 NOTE — Consult Note (Addendum)
Referring Physician: ED    Chief Complaint: left hemiparesis, unsteadiness, HA, dysarthria  HPI:                                                                                                                                         [name] is an 63 y.o. male with a past medical history significant for HTN, DM, atrial fibrillation, brought in for further evaluation of the above stated symptoms. Stated that he never had similar symptoms before but today he was at work and when he tried to walk the left leg was " heavy like plastic" and became very off balance. He said that subsequently the left arm was also clumsy and he was constantly drooping things from his left hand. Of note, he said that his speech was " slurred for a short period of time even before everything else started". Complains of having a dull HA but denies vertigo, double vision, difficulty swallowing, focal numbness, language or vision impairment. No chest pain, shortness of breath, or palpitations. CT brain revealed no acute abnormality. NIHSS 5.  Date last known well: 05/01/14 Time last known well: 3:30 PM tPA Given: no, out of the window NIHSS: 5   Past Medical History  Diagnosis Date  . Hypertension   . Diabetes mellitus without complication   . Atrial fibrillation     History reviewed. No pertinent past surgical history.  No family history on file. Social History:  reports that he has been smoking Cigarettes.  He has been smoking about 0.00 packs per day. He does not have any smokeless tobacco history on file. He reports that he does not drink alcohol or use illicit drugs.  Allergies: No Known Allergies  Medications:                                                                                                                           I have reviewed the patient's current medications.  ROS:  History obtained from the patient, wife, and chart review.  General ROS: negative for - chills, fatigue, fever, night sweats, weight gain or weight loss Psychological ROS: negative for - behavioral disorder, hallucinations, memory difficulties, mood swings or suicidal ideation Ophthalmic ROS: negative for - blurry vision, double vision, eye pain or loss of vision ENT ROS: negative for - epistaxis, nasal discharge, oral lesions, sore throat, tinnitus or vertigo Allergy and Immunology ROS: negative for - hives or itchy/watery eyes Hematological and Lymphatic ROS: negative for - bleeding problems, bruising or swollen lymph nodes Endocrine ROS: negative for - galactorrhea, hair pattern changes, polydipsia/polyuria or temperature intolerance Respiratory ROS: negative for - cough, hemoptysis, shortness of breath or wheezing Cardiovascular ROS: negative for - chest pain, dyspnea on exertion, edema or irregular heartbeat Gastrointestinal ROS: negative for - abdominal pain, diarrhea, hematemesis, nausea/vomiting or stool incontinence Genito-Urinary ROS: negative for - dysuria, hematuria, incontinence or urinary frequency/urgency Musculoskeletal ROS: negative for - joint swelling Neurological ROS: as noted in HPI Dermatological ROS: negative for rash and skin lesion changes  Physical exam: pleasant male in no apparent distress. Blood pressure 169/92, pulse 89, temperature 97.9 F (36.6 C), temperature source Oral, resp. rate 20, height 6\' 1"  (1.854 m), weight 99.2 kg (218 lb 11.1 oz), SpO2 100 %. Head: normocephalic. Neck: supple, no bruits, no JVD. Cardiac: no murmurs. Lungs: clear. Abdomen: soft, no tender, no mass. Extremities: no edema. Neurologic Examination:                                                                                                      General: Mental Status: Alert, oriented, thought content appropriate.  Speech fluent without evidence of  aphasia.  Able to follow 3 step commands without difficulty. Cranial Nerves: II: Discs flat bilaterally; Visual fields grossly normal, pupils equal, round, reactive to light and accommodation III,IV, VI: ptosis not present, extra-ocular motions intact bilaterally V,VII: smile symmetric, facial light touch sensation normal bilaterally VIII: hearing normal bilaterally IX,X: gag reflex present XI: bilateral shoulder shrug XII: midline tongue extension without atrophy or fasciculations Motor: Significant for left hemiparesis leg greater than arm but some inconsistencies on exam. Tone and bulk:normal tone throughout; no atrophy noted Sensory: Pinprick and light touch diminished left face and arm. Deep Tendon Reflexes:  Right: Upper Extremity   Left: Upper extremity   biceps (C-5 to C-6) 2/4   biceps (C-5 to C-6) 2/4 tricep (C7) 2/4    triceps (C7) 2/4 Brachioradialis (C6) 2/4  Brachioradialis (C6) 2/4  Lower Extremity Lower Extremity  quadriceps (L-2 to L-4) 2/4   quadriceps (L-2 to L-4) 2/4 Achilles (S1) 2/4   Achilles (S1) 2/4  Plantars: Right: downgoing   Left: downgoing Cerebellar: normal finger-to-nose,  normal heel-to-shin in the right and can not perform in the left leg due to weakness. Gait: No tested CV: pulses palpable throughout    Results for orders placed or performed during the hospital encounter of 05/01/14 (from the past 48 hour(s))  CBC     Status: Abnormal   Collection Time: 05/01/14  7:46 PM  Result Value  Ref Range   WBC 4.8 4.0 - 10.5 K/uL   RBC 4.23 4.22 - 5.81 MIL/uL   Hemoglobin 13.1 13.0 - 17.0 g/dL   HCT 36.7 (L) 39.0 - 52.0 %   MCV 86.8 78.0 - 100.0 fL   MCH 31.0 26.0 - 34.0 pg   MCHC 35.7 30.0 - 36.0 g/dL   RDW 13.4 11.5 - 15.5 %   Platelets 219 150 - 400 K/uL  Differential     Status: None   Collection Time: 05/01/14  7:46 PM  Result Value Ref Range   Neutrophils Relative % 59 43 - 77 %   Neutro Abs 2.8 1.7 - 7.7 K/uL   Lymphocytes Relative 35  12 - 46 %   Lymphs Abs 1.7 0.7 - 4.0 K/uL   Monocytes Relative 5 3 - 12 %   Monocytes Absolute 0.3 0.1 - 1.0 K/uL   Eosinophils Relative 1 0 - 5 %   Eosinophils Absolute 0.0 0.0 - 0.7 K/uL   Basophils Relative 0 0 - 1 %   Basophils Absolute 0.0 0.0 - 0.1 K/uL  I-stat troponin, ED (not at [facility])     Status: None   Collection Time: 05/01/14  7:54 PM  Result Value Ref Range   Troponin i, poc 0.01 0.00 - 0.08 ng/mL   Comment 3            Comment: Due to the release kinetics of cTnI, a negative result within the first hours of the onset of symptoms does not rule out myocardial infarction with certainty. If myocardial infarction is still suspected, repeat the test at appropriate intervals.   I-stat chem 8, ed     Status: Abnormal   Collection Time: 05/01/14  7:56 PM  Result Value Ref Range   Sodium 136 (L) 137 - 147 mEq/L   Potassium 4.2 3.7 - 5.3 mEq/L   Chloride 102 96 - 112 mEq/L   BUN 28 (H) 6 - 23 mg/dL   Creatinine, Ser 1.10 0.50 - 1.35 mg/dL   Glucose, Bld 281 (H) 70 - 99 mg/dL   Calcium, Ion 1.12 (L) 1.13 - 1.30 mmol/L   TCO2 24 0 - 100 mmol/L   Hemoglobin 13.6 13.0 - 17.0 g/dL   HCT 40.0 39.0 - 52.0 %   Ct Head Wo Contrast  05/01/2014   CLINICAL DATA:  Code stroke, left-sided weakness  EXAM: CT HEAD WITHOUT CONTRAST  TECHNIQUE: Contiguous axial images were obtained from the base of the skull through the vertex without intravenous contrast.  COMPARISON:  None.  FINDINGS: There is no evidence of mass effect, midline shift, or extra-axial fluid collections. There is no evidence of a space-occupying lesion or intracranial hemorrhage. There is no evidence of a cortical-based area of acute infarction. There are bilateral old basal ganglia lacunar infarcts. There is periventricular white matter low attenuation likely secondary to microangiopathy.  The ventricles and sulci are appropriate for the patient's age. The basal cisterns are patent.  Visualized portions of the orbits are  unremarkable. There is not osteoma in the anterior right ethmoid sinus. There is a left mastoid effusion. Cerebrovascular atherosclerotic calcifications are noted.  The osseous structures are unremarkable.  IMPRESSION: No acute intracranial pathology.  These results were called by telephone at the time of interpretation on 05/01/2014 at 8:00 pm to Dr. Aram Beecham, who verbally acknowledged these results.   Electronically Signed   By: Kathreen Devoid   On: 05/01/2014 20:01    Assessment: 63 y.o. male with acute  onset left hemiparesis, dysarthria that had resolved, unsteadiness, HA. NIHSS 5 but some inconsistencies on exam. He is out of the window for intravenous thrombolysis, and I am not suspecting the presence of a proximal clot/thrombus involving the right MCA. However, he has several risk factors for stroke and can not ruled out a right brain infarct. Admit to medicine and complete stroke work up. As per history, he has a history of atrial fib and will likely require oral anticoagulation after completion of MRI to determine the size of this stroke. Will continue aspirin for now. Stroke team will follow up tomorrow.     Stroke Risk Factors - HTN, DM, atrial fibrillation.   Plan: 1. HgbA1c, fasting lipid panel 2. MRI, MRA  of the brain without contrast 3. Echocardiogram 4. Carotid dopplers 5. Prophylactic therapy-aspirin after passing swallowing evaluation. 6. Risk factor modification 7. Telemetry monitoring 8. Frequent neuro checks 9. PT/OT SLP    Dorian Pod ,MD [facility] [phone]  05/01/2014, 8:08 PM

## 2014-05-01 NOTE — ED Notes (Signed)
Patient to MRI at this time.

## 2014-05-01 NOTE — ED Provider Notes (Signed)
CSN: [id number]     Arrival date & time 05/01/14  1917 History   First MD Initiated Contact with Patient 05/01/14 1937     Chief Complaint  Patient presents with  . Code Stroke    An emergency department physician performed an initial assessment on this suspected stroke patient at 83. (Consider location/radiation/quality/duration/timing/severity/associated sxs/prior Treatment) Patient is a 63 y.o. male presenting with weakness. The history is provided by the patient (the pt complains of weakness in his left leg and left arm.  starting at 330pm).  Weakness This is a new problem. The current episode started 3 to 5 hours ago. The problem occurs constantly. The problem has not changed since onset.Pertinent negatives include no chest pain, no abdominal pain and no headaches. Nothing aggravates the symptoms. Nothing relieves the symptoms.    Past Medical History  Diagnosis Date  . Hypertension   . Diabetes mellitus without complication   . Atrial fibrillation    History reviewed. No pertinent past surgical history. No family history on file. History  Substance Use Topics  . Smoking status: Current Every Day Smoker    Types: Cigarettes  . Smokeless tobacco: Not on file  . Alcohol Use: No    Review of Systems  Constitutional: Negative for appetite change and fatigue.  HENT: Negative for congestion, ear discharge and sinus pressure.   Eyes: Negative for discharge.  Respiratory: Negative for cough.   Cardiovascular: Negative for chest pain.  Gastrointestinal: Negative for abdominal pain and diarrhea.  Genitourinary: Negative for frequency and hematuria.  Musculoskeletal: Negative for back pain.       Weakness left arm and leg  Skin: Negative for rash.  Neurological: Positive for weakness. Negative for seizures and headaches.  Psychiatric/Behavioral: Negative for hallucinations.      Allergies  Review of patient's allergies indicates no known allergies.  Home Medications    Prior to Admission medications   Medication Sig Start Date End Date Taking? Authorizing Provider  aspirin 81 MG tablet Take 81 mg by mouth daily.   Yes Historical Provider, MD  benzonatate (TESSALON) 200 MG capsule Take 1 capsule (200 mg total) by mouth 3 (three) times daily as needed for cough. Patient not taking: Reported on 05/01/2014 01/11/13   Harden Mo, MD  HYDROcodone-acetaminophen (NORCO/VICODIN) 5-325 MG per tablet 1 to 2 tabs every 4 to 6 hours as needed for pain. Patient not taking: Reported on 05/01/2014 01/11/13   Harden Mo, MD  lisinopril (PRINIVIL) 10 MG tablet Take 1 tablet (10 mg total) by mouth daily. Patient not taking: Reported on 05/01/2014 01/11/13   Harden Mo, MD  meloxicam (MOBIC) 15 MG tablet Take 1 tablet (15 mg total) by mouth daily. Patient not taking: Reported on 05/01/2014 01/11/13   Harden Mo, MD  metroNIDAZOLE (FLAGYL) 500 MG tablet Take 1 tablet (500 mg total) by mouth 3 (three) times daily. Patient not taking: Reported on 05/01/2014 01/11/13   Harden Mo, MD  naproxen sodium (ANAPROX) 220 MG tablet Take 220 mg by mouth 2 (two) times daily with a meal.    Historical Provider, MD  penicillin v potassium (VEETID) 500 MG tablet Take 1 tablet (500 mg total) by mouth 3 (three) times daily. Patient not taking: Reported on 05/01/2014 01/11/13   Harden Mo, MD  tobramycin (TOBREX) 0.3 % ophthalmic solution Place 1 drop into the right eye every 4 (four) hours. Patient not taking: Reported on 05/01/2014 01/11/13   Harden Mo, MD  BP 166/101 mmHg  Pulse 75  Temp(Src) 98.4 F (36.9 C) (Oral)  Resp 20  Ht 6\' 1"  (1.854 m)  Wt 218 lb 11.1 oz (99.2 kg)  BMI 28.86 kg/m2  SpO2 98% Physical Exam  Constitutional: He is oriented to person, place, and time. He appears well-developed.  HENT:  Head: Normocephalic.  Eyes: Conjunctivae and EOM are normal. No scleral icterus.  Neck: Neck supple. No thyromegaly present.  Cardiovascular: Normal rate  and regular rhythm.  Exam reveals no gallop and no friction rub.   No murmur heard. Pulmonary/Chest: No stridor. He has no wheezes. He has no rales. He exhibits no tenderness.  Abdominal: He exhibits no distension. There is no tenderness. There is no rebound.  Musculoskeletal: Normal range of motion. He exhibits no edema.  Lymphadenopathy:    He has no cervical adenopathy.  Neurological: He is oriented to person, place, and time. He exhibits normal muscle tone. Coordination abnormal.  Mild weakness and decrease coordination in left arm and leg  Skin: No rash noted. No erythema.  Psychiatric: He has a normal mood and affect. His behavior is normal.    ED Course  Procedures (including critical care time) Labs Review Labs Reviewed  CBC - Abnormal; Notable for the following:    HCT 36.7 (*)    All other components within normal limits  COMPREHENSIVE METABOLIC PANEL - Abnormal; Notable for the following:    Sodium 135 (*)    Glucose, Bld 263 (*)    BUN 25 (*)    Total Bilirubin 0.2 (*)    GFR calc non Af Amer 73 (*)    GFR calc Af Amer 85 (*)    All other components within normal limits  I-STAT CHEM 8, ED - Abnormal; Notable for the following:    Sodium 136 (*)    BUN 28 (*)    Glucose, Bld 281 (*)    Calcium, Ion 1.12 (*)    All other components within normal limits  PROTIME-INR  APTT  DIFFERENTIAL  HEMOGLOBIN A1C  LIPID PANEL  CBC  CREATININE, SERUM  CBG MONITORING, ED  I-STAT TROPOININ, ED    Imaging Review Ct Head Wo Contrast  05/01/2014   CLINICAL DATA:  Code stroke, left-sided weakness  EXAM: CT HEAD WITHOUT CONTRAST  TECHNIQUE: Contiguous axial images were obtained from the base of the skull through the vertex without intravenous contrast.  COMPARISON:  None.  FINDINGS: There is no evidence of mass effect, midline shift, or extra-axial fluid collections. There is no evidence of a space-occupying lesion or intracranial hemorrhage. There is no evidence of a  cortical-based area of acute infarction. There are bilateral old basal ganglia lacunar infarcts. There is periventricular white matter low attenuation likely secondary to microangiopathy.  The ventricles and sulci are appropriate for the patient's age. The basal cisterns are patent.  Visualized portions of the orbits are unremarkable. There is not osteoma in the anterior right ethmoid sinus. There is a left mastoid effusion. Cerebrovascular atherosclerotic calcifications are noted.  The osseous structures are unremarkable.  IMPRESSION: No acute intracranial pathology.  These results were called by telephone at the time of interpretation on 05/01/2014 at 8:00 pm to Dr. Aram Beecham, who verbally acknowledged these results.   Electronically Signed   By: Kathreen Devoid   On: 05/01/2014 20:01   Mr Jodene Nam Head Wo Contrast  05/01/2014   CLINICAL DATA:  Acute onset LEFT arm and leg stiffness and gait imbalance 2 day well at work. Mild headache.  EXAM: MRI HEAD WITHOUT CONTRAST  MRA HEAD WITHOUT CONTRAST  TECHNIQUE: Multiplanar, multiecho pulse sequences of the brain and surrounding structures were obtained without intravenous contrast. Angiographic images of the head were obtained using MRA technique without contrast.  COMPARISON:  CT of the head May 01, 2014 at 1942 hr  FINDINGS: MRI HEAD FINDINGS  7 mm ovoid focus of reduced diffusion within the RIGHT corona radiata extending to the ipsilateral putaminal. Corresponding low ADC values. Subcentimeter bilateral remote cystic basal ganglia lacunar infarcts. Subcentimeter remote medial RIGHT thalamus lacunar infarct. Patchy to confluent supratentorial white matter FLAIR T2 hyperintensities seen. The ventricles and sulci are overall normal for patient's age. No midline shift or mass effect. Scattered punctate foci of susceptibility artifact within the thalamus as well as periphery of the supratentorial brain.  No abnormal extra-axial fluid collections. LEFT mastoid effusion.  Paranasal sinuses are are well-aerated. Ocular globes and orbital contents are nonsuspicious though not tailored for evaluation. No abnormal sellar expansion. No cerebellar tonsillar ectopia. No suspicious calvarial bone marrow signal.  MRA HEAD FINDINGS  Mild motion degraded examination.  Anterior circulation: Normal flow related enhancement of the included cervical, petrous, cavernous and supra clinoid internal carotid arteries. Patent anterior communicating artery. Normal flow related enhancement of the anterior and middle cerebral arteries, including more distal segments.  Posterior circulation: Codominant vertebral arteries. Basilar artery is patent, with normal flow related enhancement of the main branch vessels. Normal flow related enhancement of the posterior cerebral arteries.  No large vessel occlusion, hemodynamically significant stenosis, aneurysm within the anterior nor posterior circulation.  Mild luminal irregularity of the central vessels.  IMPRESSION: MRI HEAD: Acute subcentimeter RIGHT basal ganglia/ corona radiata infarct.  Multiple remote bilateral basal ganglia and RIGHT thalamus lacunar infarcts in a background of moderate to severe white matter changes suggesting chronic small vessel ischemic disease.  Scattered punctate foci of susceptibility artifact in a pattern suggesting sequelae of chronic hypertension.  LEFT mastoid effusion.  MRA HEAD: No acute vascular process or hemodynamically significant stenosis.  Mild luminal irregularity of the central vessels can be seen with atherosclerosis.  Acute findings discussed with and reconfirmed by Dr.OSVALDO CAMILO on 05/01/2014 at 9:49 pm.   Electronically Signed   By: Elon Alas   On: 05/01/2014 21:47   Mr Brain Wo Contrast  05/01/2014   CLINICAL DATA:  Acute onset LEFT arm and leg stiffness and gait imbalance 2 day well at work. Mild headache.  EXAM: MRI HEAD WITHOUT CONTRAST  MRA HEAD WITHOUT CONTRAST  TECHNIQUE: Multiplanar, multiecho  pulse sequences of the brain and surrounding structures were obtained without intravenous contrast. Angiographic images of the head were obtained using MRA technique without contrast.  COMPARISON:  CT of the head May 01, 2014 at 1942 hr  FINDINGS: MRI HEAD FINDINGS  7 mm ovoid focus of reduced diffusion within the RIGHT corona radiata extending to the ipsilateral putaminal. Corresponding low ADC values. Subcentimeter bilateral remote cystic basal ganglia lacunar infarcts. Subcentimeter remote medial RIGHT thalamus lacunar infarct. Patchy to confluent supratentorial white matter FLAIR T2 hyperintensities seen. The ventricles and sulci are overall normal for patient's age. No midline shift or mass effect. Scattered punctate foci of susceptibility artifact within the thalamus as well as periphery of the supratentorial brain.  No abnormal extra-axial fluid collections. LEFT mastoid effusion. Paranasal sinuses are are well-aerated. Ocular globes and orbital contents are nonsuspicious though not tailored for evaluation. No abnormal sellar expansion. No cerebellar tonsillar ectopia. No suspicious calvarial bone marrow signal.  MRA  HEAD FINDINGS  Mild motion degraded examination.  Anterior circulation: Normal flow related enhancement of the included cervical, petrous, cavernous and supra clinoid internal carotid arteries. Patent anterior communicating artery. Normal flow related enhancement of the anterior and middle cerebral arteries, including more distal segments.  Posterior circulation: Codominant vertebral arteries. Basilar artery is patent, with normal flow related enhancement of the main branch vessels. Normal flow related enhancement of the posterior cerebral arteries.  No large vessel occlusion, hemodynamically significant stenosis, aneurysm within the anterior nor posterior circulation.  Mild luminal irregularity of the central vessels.  IMPRESSION: MRI HEAD: Acute subcentimeter RIGHT basal ganglia/ corona  radiata infarct.  Multiple remote bilateral basal ganglia and RIGHT thalamus lacunar infarcts in a background of moderate to severe white matter changes suggesting chronic small vessel ischemic disease.  Scattered punctate foci of susceptibility artifact in a pattern suggesting sequelae of chronic hypertension.  LEFT mastoid effusion.  MRA HEAD: No acute vascular process or hemodynamically significant stenosis.  Mild luminal irregularity of the central vessels can be seen with atherosclerosis.  Acute findings discussed with and reconfirmed by Dr.OSVALDO CAMILO on 05/01/2014 at 9:49 pm.   Electronically Signed   By: Elon Alas   On: 05/01/2014 21:47     EKG Interpretation None      MDM   Final diagnoses:  CVA (cerebral infarction)    Admit stroke    Maudry Diego, MD 05/01/14 2243

## 2014-05-02 DIAGNOSIS — E1159 Type 2 diabetes mellitus with other circulatory complications: Secondary | ICD-10-CM

## 2014-05-02 DIAGNOSIS — I6350 Cerebral infarction due to unspecified occlusion or stenosis of unspecified cerebral artery: Secondary | ICD-10-CM

## 2014-05-02 DIAGNOSIS — I4891 Unspecified atrial fibrillation: Secondary | ICD-10-CM | POA: Insufficient documentation

## 2014-05-02 DIAGNOSIS — I10 Essential (primary) hypertension: Secondary | ICD-10-CM

## 2014-05-02 DIAGNOSIS — F1410 Cocaine abuse, uncomplicated: Secondary | ICD-10-CM

## 2014-05-02 DIAGNOSIS — F1411 Cocaine abuse, in remission: Secondary | ICD-10-CM | POA: Insufficient documentation

## 2014-05-02 DIAGNOSIS — E1165 Type 2 diabetes mellitus with hyperglycemia: Secondary | ICD-10-CM | POA: Insufficient documentation

## 2014-05-02 DIAGNOSIS — I517 Cardiomegaly: Secondary | ICD-10-CM

## 2014-05-02 DIAGNOSIS — I63311 Cerebral infarction due to thrombosis of right middle cerebral artery: Secondary | ICD-10-CM

## 2014-05-02 LAB — GLUCOSE, CAPILLARY
GLUCOSE-CAPILLARY: 178 mg/dL — AB (ref 70–99)
GLUCOSE-CAPILLARY: 185 mg/dL — AB (ref 70–99)
GLUCOSE-CAPILLARY: 225 mg/dL — AB (ref 70–99)
Glucose-Capillary: 201 mg/dL — ABNORMAL HIGH (ref 70–99)
Glucose-Capillary: 137 mg/dL — ABNORMAL HIGH (ref 70–99)
Glucose-Capillary: 197 mg/dL — ABNORMAL HIGH (ref 70–99)

## 2014-05-02 LAB — RAPID URINE DRUG SCREEN, HOSP PERFORMED
Amphetamines: NOT DETECTED
Barbiturates: NOT DETECTED
Benzodiazepines: NOT DETECTED
COCAINE: NOT DETECTED
Opiates: NOT DETECTED
TETRAHYDROCANNABINOL: NOT DETECTED

## 2014-05-02 LAB — LIPID PANEL
CHOL/HDL RATIO: 4.2 ratio
Cholesterol: 177 mg/dL (ref 0–200)
HDL: 42 mg/dL (ref >39)
LDL CALC: 63 mg/dL (ref 0–99)
Triglycerides: 359 mg/dL — ABNORMAL HIGH (ref <150)
VLDL: 72 mg/dL — ABNORMAL HIGH (ref 0–40)

## 2014-05-02 LAB — HEMOGLOBIN A1C
Hgb A1c MFr Bld: 8.9 % — ABNORMAL HIGH (ref <5.7)
Mean Plasma Glucose: 209 mg/dL — ABNORMAL HIGH (ref <117)

## 2014-05-02 MED ORDER — FENOFIBRATE 160 MG PO TABS
160.0000 mg | ORAL_TABLET | Freq: Every day | ORAL | Status: DC
  Administered 2014-05-02 – 2014-05-03 (×2): 160 mg via ORAL
  Filled 2014-05-02 (×2): qty 1

## 2014-05-02 MED ORDER — METFORMIN HCL 500 MG PO TABS
500.0000 mg | ORAL_TABLET | Freq: Two times a day (BID) | ORAL | Status: DC
  Administered 2014-05-02 – 2014-05-03 (×2): 500 mg via ORAL
  Filled 2014-05-02 (×3): qty 1

## 2014-05-02 NOTE — Progress Notes (Signed)
Patient Demographics  [name], is a 63 y.o. male, DOB - 26-Apr-1951, JOA:[id number]  Admit date - 05/01/2014   Admitting Physician Shanda Howells, MD  Outpatient Primary MD for the patient is [facility], MD  LOS - 1   Chief Complaint  Patient presents with  . Code Stroke      Brief narrative: Darwyn C Travis is an 63 y.o. male with a past medical history significant for HTN, DM,questionable atrial fibrillation, brought in for further evaluation of left hemiparesis, unsteadiness, HA, dysarthria.  Complains of having a dull HA but denies vertigo, double vision, difficulty swallowing, focal numbness, language or vision impairment. No chest pain, shortness of breath, or palpitations. CT brain revealed no acute abnormality.butMRI RIGHT basal ganglia/ corona radiata infarct. Multiple old bilateral basal ganglia and RIGHT thalamus lacunar infarcts. Chronic small vessel disease. Changes of chronic hypertension.   Subjective:   Dhairya Lipton today has, No headache, No chest pain, No abdominal pain - No Nausea,  No Cough - SOB.   Assessment & Plan    Active Problems:   CVA (cerebral infarction)  Acute CVA: -Still have mild right lower extremity hemiparesis. -MRI RIGHT basal ganglia/ corona radiata infarct. Multiple old bilateral basal ganglia and RIGHT thalamus lacunar infarcts. Chronic small vessel disease. Changes of chronic hypertension. -MRA no significant stenosis -Carotid Doppler still pending -LDL 63 -increase aspirin to 325 mg oral daily. -Continue with PT/OT -Cardiology were consulted regarding history of A. 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 Fib in the past.  Hypertension -Uncontrolled, but will allow for permissive hypertension, will need to gradually control.  Questionable  history of A. 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 Wynonia Lawman consulted who wanted to start him on lovenox, though per d/c note no antiplatelet/anticoagulant was listed (pt had been off cocaine x 1 yr at that time).  -consulted cardiology for further recommendation  Diabetes mellitus -Uncontrolled -Hemoglobin A1c 8.9 -appears not to be taking any medication at home, will start on metformin.  Tobacco abuse -patient was counseled, will start on nicotine patch  Code Status: full  Family Communication: patient is alert and oriented, family at bedside  Disposition Plan: home with home PT in 24 hours   Procedures  MRI brain MRA head and neck CT head   Consults   Neurology Pathology was requested   Medications  Scheduled Meds: . aspirin EC  325 mg Oral Daily  . enoxaparin (LOVENOX) injection  30 mg Subcutaneous Q24H  . insulin aspart  0-9 Units Subcutaneous 6 times per day  . lisinopril  10 mg Oral Daily   Continuous Infusions:  PRN Meds:.senna-docusate  DVT Prophylaxis  Lovenox   Lab Results  Component Value Date   PLT 219 05/01/2014    Antibiotics    Anti-infectives    None          Objective:   Filed Vitals:   05/02/14 0206 05/02/14 0400 05/02/14 0601 05/02/14 0952  BP: 184/109 162/94 153/70 170/84  Pulse: 72 63 75 70  Temp: 98.1 F (36.7 C) 97.9 F (36.6 C) 98.1 F (36.7 C)  97.7 F (36.5 C)  TempSrc: Oral Oral Oral Oral  Resp: 16 16 16 18   Height:      Weight:      SpO2: 97% 100% 99% 100%    Wt Readings from Last 3 Encounters:  05/01/14 98.93 kg (218 lb 1.6 oz)  09/02/10 99.366 kg (219 lb 1 oz)  07/17/10 104.84 kg (231 lb 2.1 oz)    No intake or output data in the 24 hours ending 05/02/14 1035   Physical Exam  Awake Alert, Oriented X 3, No new F.N deficits, Normal affect .AT,PERRAL Supple Neck,No JVD, No cervical  lymphadenopathy appriciated.  Symmetrical Chest wall movement, Good air movement bilaterally, CTAB RRR,No Gallops,Rubs or new Murmurs, No Parasternal Heave +ve B.Sounds, Abd Soft, No tenderness, No organomegaly appriciated, No rebound - guarding or rigidity. No Cyanosis, Clubbing or edema, No new Rash or bruise  ,neurological significant for left-sided weakness   Data Review   Micro Results No results found for this or any previous visit (from the past 240 hour(s)).  Radiology Reports Dg Chest 2 View  05/01/2014   CLINICAL DATA:  LEFT-sided weakness beginning at 1530 hr today, suspect stroke.  EXAM: CHEST  2 VIEW  COMPARISON:  Chest radiograph January 11, 2013  FINDINGS: The cardiac silhouette remains mildly enlarged. Mediastinal silhouette is unremarkable. No pleural effusions or focal consolidations. Confluence of vascular and over osseous shadows in LEFT lung base. No pneumothorax. Soft tissue planes and included osseous structures are nonsuspicious.  IMPRESSION: Stable mild cardiomegaly, no acute pulmonary process.   Electronically Signed   By: Elon Alas   On: 05/01/2014 23:22   Ct Head Wo Contrast  05/01/2014   CLINICAL DATA:  Code stroke, left-sided weakness  EXAM: CT HEAD WITHOUT CONTRAST  TECHNIQUE: Contiguous axial images were obtained from the base of the skull through the vertex without intravenous contrast.  COMPARISON:  None.  FINDINGS: There is no evidence of mass effect, midline shift, or extra-axial fluid collections. There is no evidence of a space-occupying lesion or intracranial hemorrhage. There is no evidence of a cortical-based area of acute infarction. There are bilateral old basal ganglia lacunar infarcts. There is periventricular white matter low attenuation likely secondary to microangiopathy.  The ventricles and sulci are appropriate for the patient's age. The basal cisterns are patent.  Visualized portions of the orbits are unremarkable. There is not osteoma in the  anterior right ethmoid sinus. There is a left mastoid effusion. Cerebrovascular atherosclerotic calcifications are noted.  The osseous structures are unremarkable.  IMPRESSION: No acute intracranial pathology.  These results were called by telephone at the time of interpretation on 05/01/2014 at 8:00 pm to Dr. Aram Beecham, who verbally acknowledged these results.   Electronically Signed   By: Kathreen Devoid   On: 05/01/2014 20:01   Mr Jodene Nam Head Wo Contrast  05/01/2014   CLINICAL DATA:  Acute onset LEFT arm and leg stiffness and gait imbalance 2 day well at work. Mild headache.  EXAM: MRI HEAD WITHOUT CONTRAST  MRA HEAD WITHOUT CONTRAST  TECHNIQUE: Multiplanar, multiecho pulse sequences of the brain and surrounding structures were obtained without intravenous contrast. Angiographic images of the head were obtained using MRA technique without contrast.  COMPARISON:  CT of the head May 01, 2014 at 1942 hr  FINDINGS: MRI HEAD FINDINGS  7 mm ovoid focus of reduced diffusion within the RIGHT corona radiata extending to the ipsilateral putaminal. Corresponding low ADC values. Subcentimeter bilateral remote cystic basal ganglia lacunar infarcts. Subcentimeter remote  medial RIGHT thalamus lacunar infarct. Patchy to confluent supratentorial white matter FLAIR T2 hyperintensities seen. The ventricles and sulci are overall normal for patient's age. No midline shift or mass effect. Scattered punctate foci of susceptibility artifact within the thalamus as well as periphery of the supratentorial brain.  No abnormal extra-axial fluid collections. LEFT mastoid effusion. Paranasal sinuses are are well-aerated. Ocular globes and orbital contents are nonsuspicious though not tailored for evaluation. No abnormal sellar expansion. No cerebellar tonsillar ectopia. No suspicious calvarial bone marrow signal.  MRA HEAD FINDINGS  Mild motion degraded examination.  Anterior circulation: Normal flow related enhancement of the included  cervical, petrous, cavernous and supra clinoid internal carotid arteries. Patent anterior communicating artery. Normal flow related enhancement of the anterior and middle cerebral arteries, including more distal segments.  Posterior circulation: Codominant vertebral arteries. Basilar artery is patent, with normal flow related enhancement of the main branch vessels. Normal flow related enhancement of the posterior cerebral arteries.  No large vessel occlusion, hemodynamically significant stenosis, aneurysm within the anterior nor posterior circulation.  Mild luminal irregularity of the central vessels.  IMPRESSION: MRI HEAD: Acute subcentimeter RIGHT basal ganglia/ corona radiata infarct.  Multiple remote bilateral basal ganglia and RIGHT thalamus lacunar infarcts in a background of moderate to severe white matter changes suggesting chronic small vessel ischemic disease.  Scattered punctate foci of susceptibility artifact in a pattern suggesting sequelae of chronic hypertension.  LEFT mastoid effusion.  MRA HEAD: No acute vascular process or hemodynamically significant stenosis.  Mild luminal irregularity of the central vessels can be seen with atherosclerosis.  Acute findings discussed with and reconfirmed by Dr.OSVALDO CAMILO on 05/01/2014 at 9:49 pm.   Electronically Signed   By: Elon Alas   On: 05/01/2014 21:47   Mr Brain Wo Contrast  05/01/2014   CLINICAL DATA:  Acute onset LEFT arm and leg stiffness and gait imbalance 2 day well at work. Mild headache.  EXAM: MRI HEAD WITHOUT CONTRAST  MRA HEAD WITHOUT CONTRAST  TECHNIQUE: Multiplanar, multiecho pulse sequences of the brain and surrounding structures were obtained without intravenous contrast. Angiographic images of the head were obtained using MRA technique without contrast.  COMPARISON:  CT of the head May 01, 2014 at 1942 hr  FINDINGS: MRI HEAD FINDINGS  7 mm ovoid focus of reduced diffusion within the RIGHT corona radiata extending to the  ipsilateral putaminal. Corresponding low ADC values. Subcentimeter bilateral remote cystic basal ganglia lacunar infarcts. Subcentimeter remote medial RIGHT thalamus lacunar infarct. Patchy to confluent supratentorial white matter FLAIR T2 hyperintensities seen. The ventricles and sulci are overall normal for patient's age. No midline shift or mass effect. Scattered punctate foci of susceptibility artifact within the thalamus as well as periphery of the supratentorial brain.  No abnormal extra-axial fluid collections. LEFT mastoid effusion. Paranasal sinuses are are well-aerated. Ocular globes and orbital contents are nonsuspicious though not tailored for evaluation. No abnormal sellar expansion. No cerebellar tonsillar ectopia. No suspicious calvarial bone marrow signal.  MRA HEAD FINDINGS  Mild motion degraded examination.  Anterior circulation: Normal flow related enhancement of the included cervical, petrous, cavernous and supra clinoid internal carotid arteries. Patent anterior communicating artery. Normal flow related enhancement of the anterior and middle cerebral arteries, including more distal segments.  Posterior circulation: Codominant vertebral arteries. Basilar artery is patent, with normal flow related enhancement of the main branch vessels. Normal flow related enhancement of the posterior cerebral arteries.  No large vessel occlusion, hemodynamically significant stenosis, aneurysm within the anterior nor posterior circulation.  Mild luminal irregularity of the central vessels.  IMPRESSION: MRI HEAD: Acute subcentimeter RIGHT basal ganglia/ corona radiata infarct.  Multiple remote bilateral basal ganglia and RIGHT thalamus lacunar infarcts in a background of moderate to severe white matter changes suggesting chronic small vessel ischemic disease.  Scattered punctate foci of susceptibility artifact in a pattern suggesting sequelae of chronic hypertension.  LEFT mastoid effusion.  MRA HEAD: No acute  vascular process or hemodynamically significant stenosis.  Mild luminal irregularity of the central vessels can be seen with atherosclerosis.  Acute findings discussed with and reconfirmed by Dr.OSVALDO CAMILO on 05/01/2014 at 9:49 pm.   Electronically Signed   By: Elon Alas   On: 05/01/2014 21:47    CBC  Recent Labs Lab 05/01/14 1946 05/01/14 1956  WBC 4.8  --   HGB 13.1 13.6  HCT 36.7* 40.0  PLT 219  --   MCV 86.8  --   MCH 31.0  --   MCHC 35.7  --   RDW 13.4  --   LYMPHSABS 1.7  --   MONOABS 0.3  --   EOSABS 0.0  --   BASOSABS 0.0  --     Chemistries   Recent Labs Lab 05/01/14 1946 05/01/14 1956  NA 135* 136*  K 4.5 4.2  CL 98 102  CO2 23  --   GLUCOSE 263* 281*  BUN 25* 28*  CREATININE 1.06 1.10  CALCIUM 8.8  --   AST 22  --   ALT 12  --   ALKPHOS 78  --   BILITOT 0.2*  --    ------------------------------------------------------------------------------------------------------------------ estimated creatinine clearance is 86.2 mL/min (by C-G formula based on Cr of 1.1). ------------------------------------------------------------------------------------------------------------------  Recent Labs  05/01/14 2037  HGBA1C 8.9*   ------------------------------------------------------------------------------------------------------------------  Recent Labs  05/02/14  CHOL 177  HDL 42  LDLCALC 63  TRIG 359*  CHOLHDL 4.2   ------------------------------------------------------------------------------------------------------------------ No results for input(s): TSH, T4TOTAL, T3FREE, THYROIDAB in the last 72 hours.  Invalid input(s): FREET3 ------------------------------------------------------------------------------------------------------------------ No results for input(s): VITAMINB12, FOLATE, FERRITIN, TIBC, IRON, RETICCTPCT in the last 72 hours.  Coagulation profile  Recent Labs Lab 05/01/14 1946  INR 0.88    No results for input(s):  DDIMER in the last 72 hours.  Cardiac Enzymes No results for input(s): CKMB, TROPONINI, MYOGLOBIN in the last 168 hours.  Invalid input(s): CK ------------------------------------------------------------------------------------------------------------------ Invalid input(s): POCBNP     Time Spent in minutes   30 minutes   Gillie Crisci M.D on 05/02/2014 at 10:35 AM  Between 7am to 7pm - Pager - [phone]  After 7pm go to [email or web address] - password [email or web address]  And look for the night coverage person covering for me after hours  [facility] Office  [phone]   **Disclaimer: This note may have been dictated with voice recognition software. Similar sounding words can inadvertently be transcribed and this note may contain transcription errors which may not have been corrected upon publication of note.**

## 2014-05-02 NOTE — Progress Notes (Signed)
  Echocardiogram 2D Echocardiogram has been performed.  [name] 05/02/2014, 10:47 AM

## 2014-05-02 NOTE — Evaluation (Signed)
Occupational Therapy Evaluation Patient Details Name: [name] MRN: [id number] DOB: 1950-09-05 Today's Date: 05/02/2014    History of Present Illness 63 yo male admiited with L side weakness with tingling, HA and dysarthria. 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 Multiple remote bilateral basal ganglia and RIGHT thalamus lacunar   Clinical Impression   PT admitted with R basal ganglia CVA. Pt currently with functional limitiations due to the deficits listed below (see OT problem list). PTA independent in all ADLSand IADLS. Pt is interested in a nutritional consult to learn more about DM and BP management through diet. Pt will benefit from skilled OT to increase their independence and safety with adls and balance to allow discharge home d/c without follow up. Ot to follow acutely for dynamic balance with adls.      Follow Up Recommendations  No OT follow up    Equipment Recommendations  None recommended by OT    Recommendations for Other Services Other (comment) (Nutritional consult for DM and BP )     Precautions / Restrictions Precautions Precautions: Fall      Mobility Bed Mobility Overal bed mobility: Independent                Transfers Overall transfer level: Needs assistance   Transfers: Sit to/from Stand Sit to Stand: Min guard         General transfer comment: pt needed to brace against bed initially but able to progress    Balance Overall balance assessment: Needs assistance                           High level balance activites: Turns High Level Balance Comments: pt with LOB with turning head in room putting ADL items away            ADL Overall ADL's : Needs assistance/impaired Eating/Feeding: Independent   Grooming: Oral care;Supervision/safety;Standing Grooming Details (indicate cue type and reason): pt with slight L lateral lean Upper Body Bathing: Modified independent;Sitting   Lower Body Bathing:  Supervison/ safety;Sit to/from stand           Toilet Transfer: Supervision/safety           Functional mobility during ADLs: Min guard General ADL Comments: Pt supine in bed on arrival. pt very concerned with new self discovery during SLP evaluation STM deficits. pt perseverating on this STM deficits and recalling the 4 words mentions in SLP evaluation.  Pt verbalized LLE weakness and numbness. Pt educated on signs symptoms of a stroke and provided handout     Vision Eye Alignment: Within Functional Limits Alignment/Gaze Preference: Within Defined Limits Ocular Range of Motion: Within Functional Limits Tracking/Visual Pursuits: Able to track stimulus in all quads without difficulty   Convergence: Within functional limits         Perception     Praxis      Pertinent Vitals/Pain Pain Assessment: No/denies pain     Hand Dominance Right   Extremity/Trunk Assessment Upper Extremity Assessment Upper Extremity Assessment: Overall WFL for tasks assessed   Lower Extremity Assessment Lower Extremity Assessment: Defer to PT evaluation;LLE deficits/detail LLE Deficits / Details: pt self reports numbness and inability to tell where it is in space   Cervical / Trunk Assessment Cervical / Trunk Assessment: Normal   Communication Communication Communication: No difficulties   Cognition Arousal/Alertness: Awake/alert Behavior During Therapy: WFL for tasks assessed/performed Overall Cognitive Status: Impaired/Different from baseline Area of Impairment: Memory  Memory: Decreased short-term memory         General Comments: pt reports "i can't remember those 4 words it was a bird and something"   General Comments       Exercises       Shoulder Instructions      Home Living Family/patient expects to be discharged to:: Private residence Living Arrangements: Spouse/significant other   Type of Home: Apartment Home Access: Stairs to enter Entrance Stairs-Number  of Steps: 2   Home Layout: Two level;Bed/bath upstairs     Bathroom Shower/Tub: Tub/shower unit;Curtain   Biochemist, clinical: Standard     Home Equipment: None   Additional Comments: 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), 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 [employer] ( [employer])  Lives With: Spouse    Prior Functioning/Environment Level of Independence: Independent             OT Diagnosis: Generalized weakness;Cognitive deficits   OT Problem List: Decreased strength;Decreased range of motion;Decreased activity tolerance;Impaired balance (sitting and/or standing);Decreased cognition   OT Treatment/Interventions: Self-care/ADL training;Therapeutic exercise;DME and/or AE instruction;Therapeutic activities;Cognitive remediation/compensation;Balance training    OT Goals(Current goals can be found in the care plan section) Acute Rehab OT Goals Patient Stated Goal: to return to work and make changes so this doesnt get worse OT Goal Formulation: With patient/family Time For Goal Achievement: 05/16/14 Potential to Achieve Goals: Good  OT Frequency: Min 2X/week   Barriers to D/C:            Co-evaluation              End of Session Equipment Utilized During Treatment: Gait belt Nurse Communication: Mobility status;Precautions  Activity Tolerance: Patient tolerated treatment well Patient left: in chair;with call bell/phone within reach;with family/visitor present;Other (comment) (MD)   Time: 3790-2409 OT Time Calculation (min): 30 min Charges:  OT General Charges $OT Visit: 1 Procedure OT Evaluation $Initial OT Evaluation Tier I: 1 Procedure OT Treatments $Self Care/Home Management : 23-37 mins G-Codes:    Parke Poisson B 05-09-14, 10:53 AM  Pager: [phone]

## 2014-05-02 NOTE — Progress Notes (Signed)
Nutrition Brief Note  Patient identified on the Malnutrition Screening Tool (MST) Report.  Wt Readings from Last 15 Encounters:  05/01/14 218 lb 1.6 oz (98.93 kg)  09/02/10 219 lb 1 oz (99.366 kg)  07/17/10 231 lb 2.1 oz (104.84 kg)  03/07/10 229 lb 4.8 oz (104.01 kg)  05/09/09 228 lb (103.42 kg)  04/24/09 231 lb (104.781 kg)  04/11/09 232 lb (105.235 kg)  03/27/09 231 lb (104.781 kg)  07/19/07 228 lb (103.42 kg)  07/15/07 231 lb 8 oz (105.008 kg)  05/10/07 224 lb (101.606 kg)    Body mass index is 28.78 kg/(m^2). Patient meets criteria for overweight based on current BMI.   Current diet order is heart healthy, CHO-modified, patient is consuming approximately 100% of meals at this time. Labs and medications reviewed.   RN requested nutrition education regarding diabetes and hypertension.   Lab Results  Component Value Date   HGBA1C 8.9* 05/01/2014    RD provided "Carbohydrate Counting for People with Diabetes" and "Hypertension Nutrition Therapy" handouts from the Academy of Nutrition and Dietetics.  Discussed importance of controlled and consistent carbohydrate intake throughout the day. Provided list of carbohydrates and recommended serving sizes of common foods. Reviewed low sodium diet guidelines and was to reduce sodium in his diet. Teach back method used.  Expect fair compliance.  No further nutrition interventions warranted at this time. If nutrition issues arise, please consult RD.    Molli Barrows, RD, LDN, [place] Pager [phone] After Hours Pager [phone]

## 2014-05-02 NOTE — Plan of Care (Signed)
Problem: Acute Treatment Outcomes Goal: Neuro exam at baseline or improved Outcome: Progressing Goal: Airway maintained/protected Outcome: Completed/Met Date Met:  05/02/14 Goal: 02 Sats > 94% Outcome: Completed/Met Date Met:  05/02/14 Goal: Hemodynamically stable Outcome: Completed/Met Date Met:  05/02/14 Goal: tPA Patient w/o S&S of bleeding Outcome: Not Applicable Date Met:  83/38/25 Goal: Prognosis discussed with family/patient as appropriate Outcome: Progressing Goal: Other Acute Treatment Outcomes Outcome: Progressing

## 2014-05-02 NOTE — Consult Note (Signed)
CARDIOLOGY CONSULT NOTE   Patient ID: [name] MRN: [id number], DOB/AGE: Nov 24, 1950   Admit date: 05/01/2014 Date of Consult: 05/02/2014  Primary Physician: Mack Hook, MD Primary Cardiologist: none  Reason for consult:  CVA, a-fib  Problem List  Past Medical History  Diagnosis Date  . Hypertension   . Diabetes mellitus without complication   . Atrial fibrillation     History reviewed. No pertinent past surgical history.   Allergies  No Known Allergies  HPI   [name] is an 63 y.o. male with a past medical history significant for HTN, DM, atrial fibrillation, who was admitted yesterday with acute subcentimeter right basal ganglia/ corona radiata infarct.  Multiple remote bilateral basal ganglia and right thalamus lacunar infarcts in a background of moderate to severe white matter changes suggesting chronic small vessel ischemic disease.  Scattered punctate foci of susceptibility artifact in a pattern suggesting sequelae of chronic hypertension.  We were consulted for management of anticoagulation. The patient states that few years ago, he was hospitalized with acute palpitations and SOB. He was diagnosed with a-fib but didn't follow with cardiology. He was not aware of palpitations since then. Echocardiogram in 2007 was normal.  He is an ongoing smoker. No FH of premature CAD.   Inpatient Medications  . aspirin EC  325 mg Oral Daily  . enoxaparin (LOVENOX) injection  30 mg Subcutaneous Q24H  . insulin aspart  0-9 Units Subcutaneous 6 times per day  . lisinopril  10 mg Oral Daily  . metFORMIN  500 mg Oral BID WC    Family History History reviewed. No pertinent family history.   Social History History   Social History  . Marital Status: Married    Spouse Name: N/A    Number of Children: N/A  . Years of Education: N/A   Occupational History  . Not on file.   Social History Main Topics  . Smoking status: Current Every Day Smoker      Types: Cigarettes  . Smokeless tobacco: Not on file  . Alcohol Use: No  . Drug Use: No  . Sexual Activity: Not on file   Other Topics Concern  . Not on file   Social History Narrative     Review of Systems  General:  No chills, fever, night sweats or weight changes.  Cardiovascular:  No chest pain, dyspnea on exertion, edema, orthopnea, palpitations, paroxysmal nocturnal dyspnea. Dermatological: No rash, lesions/masses Respiratory: No cough, dyspnea Urologic: No hematuria, dysuria Abdominal:   No nausea, vomiting, diarrhea, bright red blood per rectum, melena, or hematemesis Neurologic:  No visual changes, wkns, changes in mental status. All other systems reviewed and are otherwise negative except as noted above.  Physical Exam  Blood pressure 156/100, pulse 64, temperature 97.9 F (36.6 C), temperature source Oral, resp. rate 16, height 6\' 1"  (1.854 m), weight 218 lb 1.6 oz (98.93 kg), SpO2 99 %.  General: Pleasant, NAD Psych: Normal affect. Neuro: Alert and oriented X 3. Moves all extremities spontaneously. HEENT: Normal  Neck: Supple without bruits or JVD. Lungs:  Resp regular and unlabored, CTA. Heart: RRR no s3, s4, or murmurs. Abdomen: Soft, non-tender, non-distended, BS + x 4.  Extremities: No clubbing, cyanosis or edema. DP/PT/Radials 2+ and equal bilaterally.  Labs  No results for input(s): CKTOTAL, CKMB, TROPONINI in the last 72 hours. Lab Results  Component Value Date   WBC 4.8 05/01/2014   HGB 13.6 05/01/2014   HCT 40.0 05/01/2014   MCV 86.8 05/01/2014  PLT 219 05/01/2014    Recent Labs Lab 05/01/14 1946 05/01/14 1956  NA 135* 136*  K 4.5 4.2  CL 98 102  CO2 23  --   BUN 25* 28*  CREATININE 1.06 1.10  CALCIUM 8.8  --   PROT 7.0  --   BILITOT 0.2*  --   ALKPHOS 78  --   ALT 12  --   AST 22  --   GLUCOSE 263* 281*   Lab Results  Component Value Date   CHOL 177 05/02/2014   HDL 42 05/02/2014   LDLCALC 63 05/02/2014   TRIG 359*  05/02/2014   No results found for: DDIMER Invalid input(s): POCBNP  Radiology/Studies  Dg Chest 2 View  05/01/2014   CLINICAL DATA:  LEFT-sided weakness beginning at 1530 hr today, suspect stroke.  EXAM: CHEST  2 VIEW  COMPARISON:  Chest radiograph January 11, 2013  FINDINGS: The cardiac silhouette remains mildly enlarged. Mediastinal silhouette is unremarkable. No pleural effusions or focal consolidations. Confluence of vascular and over osseous shadows in LEFT lung base. No pneumothorax. Soft tissue planes and included osseous structures are nonsuspicious.  IMPRESSION: Stable mild cardiomegaly, no acute pulmonary process.     Mr Brain Wo Contrast  05/01/2014   CLINICAL DATA:  Acute onset LEFT arm and leg stiffness and gait imbalance 2 day well at work. Mild headache.    IMPRESSION: MRI HEAD: Acute subcentimeter RIGHT basal ganglia/ corona radiata infarct.  Multiple remote bilateral basal ganglia and RIGHT thalamus lacunar infarcts in a background of moderate to severe white matter changes suggesting chronic small vessel ischemic disease.  Scattered punctate foci of susceptibility artifact in a pattern suggesting sequelae of chronic hypertension.  LEFT mastoid effusion.  MRA HEAD: No acute vascular process or hemodynamically significant stenosis.  Mild luminal irregularity of the central vessels can be seen with atherosclerosis.   Echocardiogram  - 2007 Left ventricular size was normal. - Overall left ventricular systolic function was normal. - Left ventricular ejection fraction was estimated to be 60 %. - There were no left ventricular regional wall motion    abnormalities. - Left ventricular wall thickness was moderately increased. AORTIC VALVE: - The aortic valve was trileaflet. - Aortic valve thickness was normal. - There was normal aortic valve leaflet excursion. Doppler interpretation(s): - There was no significant aortic valvular regurgitation. AORTA: - The  aortic root was normal in size. MITRAL VALVE: - Mitral valve structure was normal. - There was normal mitral valve leaflet excursion. Doppler interpretation(s): - There was mild mitral valvular regurgitation. LEFT ATRIUM: - Left atrial size was normal. RIGHT VENTRICLE: - Right ventricular size was normal. - Right ventricular systolic function was normal. - Right ventricular wall thickness was normal. TRICUSPID VALVE: - The tricuspid valve structure was normal. - Tricuspid leaflet excursion was normal.  ECG: Wandering pacemaker, 58 BPM, negative T waves in V5-6  Telemetry: SR alternating with wandering pacemaker   ASSESSMENT AND PLAN  63 y.o. male   1. Acute right basal ganglia/ corona radiata infarct and findings of multiple remote bilateral basal ganglia and right thalamus lacunar infarcts in a background of moderate to severe white matter changes suggesting chronic small vessel ischemic disease.  The patient has h/o atrial fibrillation. His CHADS-VASc score is 4 and there he should be on anticoagulation. We will order an echocardiogram and if no significant valvular disease we would recommend to start NOAC - Xarelto or Eliquis.   2. Paroxysmal atrial fibrillation - as above, would avoid  betablockers with baseline bradycardia   3. HTN - after acute stroke, in persistent increase lisinopril to 20 mg po daily  4. Hyperlipidemia - TG > 300, LDL 63, start fenofibrate 160 mg po daily   Signed, Scott Spark, MD, [facility] 05/02/2014, 12:59 PM

## 2014-05-02 NOTE — Progress Notes (Signed)
STROKE TEAM PROGRESS NOTE   HISTORY [name] is an 63 y.o. male with a past medical history significant for HTN, DM, atrial fibrillation, brought in for further evaluation of left hemiparesis, unsteadiness, HA, dysarthria. Stated that he never had similar symptoms before but today he was at work and when he tried to walk the left leg was " heavy like plastic" and became very off balance. He said that subsequently the left arm was also clumsy and he was constantly drooping things from his left hand. Of note, he said that his speech was " slurred for a short period of time even before everything else started".  Complains of having a dull HA but denies vertigo, double vision, difficulty swallowing, focal numbness, language or vision impairment. No chest pain, shortness of breath, or palpitations. CT brain revealed no acute abnormality. NIHSS 5. He was last known well 05/01/14 at  3:30 PM Patient was not administered TPA secondary to delay in arrival. He was admitted for further evaluation and treatment.   SUBJECTIVE (INTERVAL HISTORY) His wife is at the bedside.  Overall he feels his condition is gradually improving. Patient states he had a similar episode 3 weeks ago with symptoms lasting about 2 hours. As per chart, he had Afib with RVR in 2006 and was put on Cardizem drip. Planned to start anticoagulation but never did. As per chart, he had quit cocain for a year when Afib found. However, pt stated that his afib was caused by his using of cocaine at that time. But he did admit that his memory of the past is not good due to cocaine and legal troubles at that time. Pt had no symptoms of afib since then.    OBJECTIVE Temp:  [97.9 F (36.6 C)-98.4 F (36.9 C)] 98.1 F (36.7 C) (11/11 0601) Pulse Rate:  [63-89] 75 (11/11 0601) Cardiac Rhythm:  [-] Normal sinus rhythm (11/11 0002) Resp:  [16-20] 16 (11/11 0601) BP: (153-185)/(70-109) 153/70 mmHg (11/11 0601) SpO2:  [97 %-100 %] 99 %  (11/11 0601) Weight:  [98.93 kg (218 lb 1.6 oz)-99.2 kg (218 lb 11.1 oz)] 98.93 kg (218 lb 1.6 oz) (11/10 2345)   Recent Labs Lab 05/02/14 0013 05/02/14 0350 05/02/14 0756  GLUCAP 225* 201* 137*    Recent Labs Lab 05/01/14 1946 05/01/14 1956  NA 135* 136*  K 4.5 4.2  CL 98 102  CO2 23  --   GLUCOSE 263* 281*  BUN 25* 28*  CREATININE 1.06 1.10  CALCIUM 8.8  --     Recent Labs Lab 05/01/14 1946  AST 22  ALT 12  ALKPHOS 78  BILITOT 0.2*  PROT 7.0  ALBUMIN 3.6    Recent Labs Lab 05/01/14 1946 05/01/14 1956  WBC 4.8  --   NEUTROABS 2.8  --   HGB 13.1 13.6  HCT 36.7* 40.0  MCV 86.8  --   PLT 219  --    No results for input(s): CKTOTAL, CKMB, CKMBINDEX, TROPONINI in the last 168 hours.  Recent Labs  05/01/14 1946  LABPROT 12.0  INR 0.88   No results for input(s): COLORURINE, LABSPEC, PHURINE, GLUCOSEU, HGBUR, BILIRUBINUR, KETONESUR, PROTEINUR, UROBILINOGEN, NITRITE, LEUKOCYTESUR in the last 72 hours.  Invalid input(s): APPERANCEUR     Component Value Date/Time   CHOL 177 05/02/2014 0000   TRIG 359* 05/02/2014 0000   HDL 42 05/02/2014 0000   CHOLHDL 4.2 05/02/2014 0000   VLDL 72* 05/02/2014 0000   LDLCALC 63 05/02/2014 0000   Lab  Results  Component Value Date   HGBA1C 8.9* 05/01/2014      Component Value Date/Time   LABOPIA NONE DETECTED 05/02/2014 0342   COCAINSCRNUR NONE DETECTED 05/02/2014 0342   LABBENZ NONE DETECTED 05/02/2014 0342   AMPHETMU NONE DETECTED 05/02/2014 0342   THCU NONE DETECTED 05/02/2014 0342   LABBARB NONE DETECTED 05/02/2014 0342    No results for input(s): ETH in the last 168 hours.  Dg Chest 2 View  05/01/2014   IMPRESSION: Stable mild cardiomegaly, no acute pulmonary process.     Ct Head Wo Contrast  05/01/2014   IMPRESSION: No acute intracranial pathology.     Mri and Mra Head Wo Contrast  05/01/2014   IMPRESSION: MRI HEAD: Acute subcentimeter RIGHT basal ganglia/ corona radiata infarct.  Multiple remote  bilateral basal ganglia and RIGHT thalamus lacunar infarcts in a background of moderate to severe white matter changes suggesting chronic small vessel ischemic disease.  Scattered punctate foci of susceptibility artifact in a pattern suggesting sequelae of chronic hypertension.  LEFT mastoid effusion.  MRA HEAD: No acute vascular process or hemodynamically significant stenosis.  Mild luminal irregularity of the central vessels can be seen with atherosclerosis.   EKG 05/01/14 - Unknown rhythm, irregular rate, Borderline T wave abnormalities, Minimal ST elevation, anterior leads  CUS pending  2D echo - pending  PHYSICAL EXAM  Temp:  [97.7 F (36.5 C)-98.4 F (36.9 C)] 97.7 F (36.5 C) (11/11 0952) Pulse Rate:  [63-89] 70 (11/11 0952) Resp:  [16-20] 18 (11/11 0952) BP: (153-185)/(70-109) 170/84 mmHg (11/11 0952) SpO2:  [97 %-100 %] 100 % (11/11 0952) Weight:  [218 lb 1.6 oz (98.93 kg)-218 lb 11.1 oz (99.2 kg)] 218 lb 1.6 oz (98.93 kg) (11/10 2345)  General - Well nourished, well developed, in no apparent distress.  Ophthalmologic - not able to see through.  Cardiovascular - Regular rate and rhythm with no murmur.  Mental Status -  Level of arousal and orientation to time, place, and person were intact. Language including expression, naming, repetition, comprehension was assessed and found intact. Attention span and concentration were normal. Fund of Knowledge was assessed and was intact.  Cranial Nerves II - XII - II - Visual field intact OU. III, IV, VI - Extraocular movements intact. V - Facial sensation intact bilaterally. VII - Facial movement intact bilaterally. VIII - Hearing & vestibular intact bilaterally. X - Palate elevates symmetrically. XI - Chin turning & shoulder shrug intact bilaterally. XII - Tongue protrusion intact.  Motor Strength - The patient's strength was normal in all extremities except LLE 4+/5 proximal and pronator drift was absent.  Bulk was normal  and fasciculations were absent.   Motor Tone - Muscle tone was assessed at the neck and appendages and was normal.  Reflexes - The patient's reflexes were normal in all extremities and he had no pathological reflexes.  Sensory - Light touch, temperature/pinprickwere assessed and were normal. Romberg testing showed teetering but no fall.  Coordination - The patient had normal movements in the hands and feet with no ataxia or dysmetria.  Tremor was absent.  Gait and Station - The patient's transfers, posture, gait, station, and turns were observed as normal.   ASSESSMENT/PLAN [name] is a 63 y.o. male with history of HTN, DM, smoker, previous cocaine use and hx of afib presenting with left hemiparesis, unsteadiness, HA, dysarthria. He did not receive IV t-PA due to delay in arrival. His MRI findings consistent with small vessel stroke likely due to  his risk factors including HTN, DM, smoking and cocaine use. However, he does have hx of afib with RVR in 2006, however the condition surrounding the event was not clear, not sure if just lone afib induced by cocaine or true paroxysmal afib. Recommend to have cardiology consult, if confirmed as paroxysmal afib, he needs anticoagulation as his CHADS-VASc score is high. In whatever condition, he needs to control risk factors and compliant with medication.  Stroke:  Non-dominant right basal ganglia infarct likely secondary to small vessel disease source.  Resultant  RLE prox 4+/5 hemiparesis  MRI  RIGHT basal ganglia/ corona radiata infarct.  Multiple old bilateral basal ganglia and RIGHT thalamus lacunar infarcts. Chronic small vessel disease.  Changes of chronic hypertension.   MRA  No significant stenosis   Carotid Doppler  pending   2D Echo  pending   LDL 63, at the goal < 70  A1C 8.9, not meet the goal < 6.5  Lovenox 30 mg sq daily for VTE prophylaxis  Diet heart healthy/carb modified thin liquids  aspirin 81 mg orally  every day prior to admission, now on aspirin 325 mg orally every day.  Ongoing aggressive risk factor management  Therapy recommendations:  pending   Disposition:  Anticipate return home  Atrial Fibrillation  Hx cocaine use, he thinks his dx of atrial fibrillation was related to cocaine use. He reports a "change in life style" has led to it no longer being a problem. However, discharge note from 05/21/2005 reports new onset afib with Dr. Wynonia Lawman consulted who wanted to start him on lovenox, though per d/c note no antiplatelet/anticoagulant was listed (as per chart, pt had been off cocaine x 1 yr at that time).   Home meds:  No anticoagulation  Current stroke is not felt to be related to anticoagulation  However, given hx of atrial fibrillation, that makes his CHA2DS2-VASc Score = 4, ?2 oral anticoagulation recommended  Age in Years:  <65   0    Sex:  Male   0    Hypertension History:  yes   +1     Diabetes Mellitus:  yes   +1   Congestive Heart Failure History:  0  Vascular Disease History:  0    Stroke/TIA/Thromboembolism History:  yes   +2  Recommend cardiology consult and rule in or rule out paroxysmal atrial fibrillation    Accelerated Hypertension  Home meds:   prinvil BP as high as 184/109 Permissive hypertension (OK if < 220/120) for 24-48 hours and then gradually normalize in 5-7 days   Patient counseled to remain compliant with his blood pressure medications  Diabetes  HgbA1c 8.9 goal < 7.0  Uncontrolled  Pt stated that he was not compliant with insulin at home  DM education  Manage per primary team  Tobacco abuse  current smoker, 20+ history  3PPD in the past, currently 3 cig per day  smoking cessation counseling provided  Other Stroke Risk Factors  Hx cocaine use, urine tox negative this time  Obesity, Body mass index is 28.78 kg/(m^2).   Hospital day # 1  Burnetta Sabin, MSN, APRN, ANVP-BC, AGPCNP-BC [facility] Pager:  [phone] 05/02/2014 9:21 AM   I, the attending vascular neurologist, have personally obtained a history, examined the patient, evaluated laboratory data, individually viewed imaging studies. Together with the NP/PA, we formulated the assessment and plan of care which reflects our mutual decision.  I have made any additions or clarifications directly to the above note and  agree with the findings and plan as currently documented.   Please note: All text in blue color in the note is my addition to the original note.   Rosalin Hawking, MD PhD Stroke Neurology 05/02/2014 12:24 PM  To contact Stroke Continuity provider, please refer to [email or web address] After hours, contact General Neurology

## 2014-05-02 NOTE — Evaluation (Signed)
Speech Language Pathology Evaluation Patient Details Name: [name] MRN: [id number] DOB: Jun 19, 1951 Today's Date: 05/02/2014 Time: 1021-1173 SLP Time Calculation (min) (ACUTE ONLY): 20 min  Problem List:  Patient Active Problem List   Diagnosis Date Noted  . CVA (cerebral infarction) 05/01/2014  . GASTROENTERITIS 09/02/2010  . 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 INGUINAL PAIN, BILATERAL 07/17/2010  . CHEST PAIN 03/07/2010  . DEGENERATIVE JOINT DISEASE, CERVICAL SPINE 04/24/2009  . NECK PAIN 04/11/2009  . ABSCESS OF ANAL AND RECTAL REGIONS 07/15/2007  . FISTULA, ANAL 01/27/2007  . DIABETES MELLITUS, TYPE II 07/03/2006  . HYPERLIPIDEMIA 07/03/2006  . TOBACCO ABUSE 07/03/2006  . FIBRILLATION, ATRIAL 07/03/2006  . HIATAL HERNIA WITH REFLUX 07/03/2006  . COCAINE ABUSE 06/08/2006  . HYPERTENSION 06/08/2006   Past Medical History:  Past Medical History  Diagnosis Date  . Hypertension   . Diabetes mellitus without complication   . Atrial fibrillation    Past Surgical History: History reviewed. No pertinent past surgical history. HPI:  Patient is a 63 y.o. male presenting with weakness. MRI shows Acute subcentimeter RIGHT basal ganglia/ corona radiata   Assessment / Plan / Recommendation Clinical Impression  Pt presnts with mild short term memory deficits, difficulty recalling new and recent information. SLP provided basic comepnsatory strategies. Recommend OP SLP assessment and therapy if needed. Pt and wife aware. No further acute SLP needed.     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 )   Cognition  Overall Cognitive Status: Impaired/Different from baseline Arousal/Alertness: Awake/alert Orientation Level: Oriented X4 Attention: Focused;Sustained;Selective Focused Attention: Appears intact Sustained Attention: Appears intact Selective Attention: Appears intact Memory: Impaired Memory Impairment: Decreased short term memory;Decreased recall of new information;Retrieval deficit Decreased Short Term Memory: Verbal complex Awareness: Appears intact Problem Solving: Appears intact Executive Function: Reasoning;Sequencing;Organizing;Self Monitoring;Self Correcting;Initiating;Decision Making Reasoning: Appears intact Sequencing: Appears intact Organizing: Appears intact Decision Making: Appears intact Initiating: Appears intact Self Monitoring: Appears intact Self Correcting: Appears intact Safety/Judgment: Appears intact    Comprehension  Auditory Comprehension Overall Auditory Comprehension: Appears within functional limits for tasks assessed    Expression Verbal Expression Overall Verbal Expression: Appears within functional limits for tasks assessed Written Expression Dominant Hand: Right   Oral / Motor Oral Motor/Sensory Function Overall Oral Motor/Sensory Function: Appears within functional limits for tasks assessed Motor Speech Overall Motor Speech: Appears within functional limits for tasks assessed   GO    [name], [name] [phone]  Lynann Beaver 05/02/2014, 9:05 AM

## 2014-05-02 NOTE — Evaluation (Signed)
Physical Therapy Evaluation Patient Details Name: [name] MRN: [id number] DOB: 10-14-1950 Today's Date: 05/02/2014   History of Present Illness  63 yo male admiited with L side weakness with tingling, HA and dysarthria. 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 Multiple remote bilateral basal ganglia and RIGHT thalamus lacunar    Clinical Impression  Patient presents with functional limitations due to deficits listed in PT problem list (see below). Pt with L hemiparesis and balance deficits impacting safe mobility. Pt requires Min A during gait training due to dragging of LLE especially when fatigued. Recommend supervision at home for safety. Will need to assess ability to negotiate steps next session prior to discharge. Pt would benefit from acute PT to improve transfers, gait, balance and mobility so pt can maximize independence and return to PLOF.    Follow Up Recommendations Outpatient PT;Supervision for mobility/OOB    Equipment Recommendations  None recommended by PT    Recommendations for Other Services OT consult     Precautions / Restrictions Precautions Precautions: Fall Restrictions Weight Bearing Restrictions: No      Mobility  Bed Mobility Overal bed mobility: Independent                Transfers Overall transfer level: Needs assistance Equipment used: None Transfers: Sit to/from Stand Sit to Stand: Min guard         General transfer comment: Needed to brace against bed initially due to unsteadiness in LLE. Stood from [employer], from chair x1.  Ambulation/Gait Ambulation/Gait assistance: Min assist Ambulation Distance (Feet): 150 Feet Assistive device: None Gait Pattern/deviations: Step-through pattern;Decreased stance time - left;Decreased dorsiflexion - left;Narrow base of support   Gait velocity interpretation: Below normal speed for age/gender General Gait Details: VC's to decrease stride length and widen BoS to assist with  balance. Pt with decreased DF of left foot resulting in dragging of LLE during swing phase and a few instances of tripping on left leg especially when fatigued. VC's to exaggerate hip flexion during swing to safely advance LLE.  Stairs            Wheelchair Mobility    Modified Rankin (Stroke Patients Only) Modified Rankin (Stroke Patients Only) Pre-Morbid Rankin Score: No symptoms Modified Rankin: Moderately severe disability     Balance Overall balance assessment: Needs assistance Sitting-balance support: Feet supported;No upper extremity supported Sitting balance-Leahy Scale: Fair     Standing balance support: During functional activity Standing balance-Leahy Scale: Fair       Tandem Stance - Right Leg: 30 (with eyes opened and 25 sec with eyes closed with sway,) Tandem Stance - Left Leg: 20 (eyes open and 4 sec eyes closed with LOB.) Rhomberg - Eyes Opened: 30 Rhomberg - Eyes Closed: 30 (increased sway but no LOB.)                 Pertinent Vitals/Pain Pain Assessment: No/denies pain    Home Living Family/patient expects to be discharged to:: Private residence Living Arrangements: Spouse/significant other   Type of Home: Apartment Home Access: Stairs to enter   Technical brewer of Steps: 2 Home Layout: Two level;Bed/bath upstairs Home Equipment: [name] - single point Additional Comments: 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  Cervical / Trunk Assessment: Normal  Communication   Communication: No difficulties  Cognition Arousal/Alertness:  Awake/alert Behavior During Therapy: WFL for tasks assessed/performed Overall Cognitive Status: No family/caregiver present to determine baseline cognitive functioning                      General Comments General comments (skin integrity, edema, etc.): Explained unpredicatibility of recovery post stroke. Pt concerned about gaining full function of LLE. Discussed possibility of using RW at home for short period for support.    Exercises        Assessment/Plan    PT Assessment Patient needs continued PT services  PT Diagnosis Generalized weakness;Abnormality of gait   PT Problem List Decreased strength;Decreased activity tolerance;Decreased balance;Decreased mobility;Decreased safety awareness;Impaired sensation;Decreased knowledge of use of DME  PT Treatment Interventions Balance training;Neuromuscular re-education;Gait training;Stair training;Patient/family education;Functional mobility training;Therapeutic activities;Therapeutic exercise;Cognitive remediation   PT Goals (Current goals can be found in the Care Plan section) Acute Rehab PT Goals Patient Stated Goal: to return to PLOF. PT Goal Formulation: With patient Time For Goal Achievement: 05/16/14 Potential to Achieve Goals: Good    Frequency Min 4X/week   Barriers to discharge Inaccessible home environment;Decreased caregiver support Pt has steps to climb to get into apt; Wife works until noon daily.    Co-evaluation               End of Session Equipment Utilized During Treatment: Gait belt Activity Tolerance: Patient tolerated treatment well Patient left: in chair;with call bell/phone within reach Nurse Communication: Precautions         Time: 1122-1202 PT Time Calculation (min) (ACUTE ONLY): 40 min   Charges:   PT Evaluation $Initial PT Evaluation Tier I: 1 Procedure PT Treatments $Gait Training: 8-22 mins $Self Care/Home Management: 8-22   PT G CodesCandy Travis  A 05/02/2014, 12:22 PM  [name], [place], DPT [phone]

## 2014-05-02 NOTE — Consult Note (Addendum)
Entered in error

## 2014-05-02 NOTE — Progress Notes (Signed)
Patient's BP elevated at 184/109. Md notified. No new order given. Md will allow for permissive hypertension. RN will continue to monitor.

## 2014-05-02 NOTE — Progress Notes (Signed)
Inpatient Diabetes Program Recommendations  AACE/ADA: New Consensus Statement on Inpatient Glycemic Control  Target Ranges:  Prepandial:   less than 140 mg/dL      Peak postprandial:   less than 180 mg/dL (1-2 hours)      Critically ill patients:  140 - 180 mg/dL  Pager:  [phone] Hours:  8 am-10pm   Reason for Visit: Elevated A1C:  8.9%  Inpatient Diabetes Program Recommendations Oral Agents: Consider adding oral agents prior to discharge based on elevated A1C of 8.9% Outpatient Referral: Referred to [facility]  Courtney Heys PhD, RN, BC-ADM Diabetes Coordinator  Office:  [phone] Team Pager:  [phone]

## 2014-05-03 DIAGNOSIS — I6309 Cerebral infarction due to thrombosis of other precerebral artery: Secondary | ICD-10-CM

## 2014-05-03 LAB — GLUCOSE, CAPILLARY
GLUCOSE-CAPILLARY: 132 mg/dL — AB (ref 70–99)
GLUCOSE-CAPILLARY: 112 mg/dL — AB (ref 70–99)
Glucose-Capillary: 123 mg/dL — ABNORMAL HIGH (ref 70–99)
Glucose-Capillary: 133 mg/dL — ABNORMAL HIGH (ref 70–99)

## 2014-05-03 MED ORDER — LISINOPRIL 10 MG PO TABS: 20.0000 mg | ORAL_TABLET | Freq: Every day | ORAL | Status: DC

## 2014-05-03 MED ORDER — APIXABAN 5 MG PO TABS: 5.0000 mg | ORAL_TABLET | Freq: Two times a day (BID) | ORAL | Status: DC

## 2014-05-03 MED ORDER — FENOFIBRATE 160 MG PO TABS: 160.0000 mg | ORAL_TABLET | Freq: Every day | ORAL | Status: DC

## 2014-05-03 MED ORDER — LIVING WELL WITH DIABETES BOOK
Freq: Once | Status: DC
  Filled 2014-05-03: qty 1

## 2014-05-03 MED ORDER — METFORMIN HCL 500 MG PO TABS: 500.0000 mg | ORAL_TABLET | Freq: Two times a day (BID) | ORAL | Status: DC

## 2014-05-03 NOTE — Progress Notes (Signed)
Talked to patient about DCP/ [place] choices, patient does not want any [place] services at this time and would prefer Outpatient therapy; referral made to the [facility] in [place] for Outpatient OT; orders and clinical information faxed/ the rehab center will contact the patient with start up date and time; Aneta Mins [phone]

## 2014-05-03 NOTE — Progress Notes (Signed)
Physical Therapy Treatment Patient Details Name: MERLYN CONLEY MRN: [id number] DOB: [birth year] Today's Date: 05/03/2014    History of Present Illness 63 yo male admiited with L side weakness with tingling, HA and dysarthria. 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 Multiple remote bilateral basal ganglia and RIGHT thalamus lacunar    PT Comments    Patient progressing and working on ambulation without AD. At this time recommend continued use of RW for ambulation at home. Patient able to complete stair training this AM. Reviewed sign and symptoms of CVA at end of session. Continue to recommend Outpatient Physical Therapy at [place].   Follow Up Recommendations  Outpatient PT;Supervision for mobility/OOB     Equipment Recommendations  None recommended by PT    Recommendations for Other Services       Precautions / Restrictions Precautions Precautions: Fall    Mobility  Bed Mobility Overal bed mobility: Independent                Transfers Overall transfer level: Modified independent                  Ambulation/Gait Ambulation/Gait assistance: Min guard;Supervision Ambulation Distance (Feet): 600 Feet Assistive device: Rolling walker (2 wheeled);None Gait Pattern/deviations: Step-through pattern;Decreased stride length   Gait velocity interpretation: Below normal speed for age/gender General Gait Details: MinGuard without AD and Supervision with use of RW. Patient will use RW at home. Working on balance without use of RW and ability to compensate for L foot as it begins to drag   Stairs Stairs: Yes Stairs assistance: Min guard Stair Management: One rail Right Number of Stairs: 20 General stair comments: Minguard A for safety with steps. Cues for sequency  Wheelchair Mobility    Modified Rankin (Stroke Patients Only) Modified Rankin (Stroke Patients Only) Pre-Morbid Rankin Score: No symptoms Modified Rankin: Moderately severe  disability     Balance                                    Cognition Arousal/Alertness: Awake/alert Behavior During Therapy: WFL for tasks assessed/performed Overall Cognitive Status: Within Functional Limits for tasks assessed                      Exercises      General Comments        Pertinent Vitals/Pain Pain Assessment: No/denies pain    Home Living                      Prior Function            PT Goals (current goals can now be found in the care plan section) Progress towards PT goals: Progressing toward goals    Frequency  Min 4X/week    PT Plan Current plan remains appropriate    Co-evaluation             End of Session Equipment Utilized During Treatment: Gait belt Activity Tolerance: Patient tolerated treatment well Patient left: in chair;with call bell/phone within reach     Time: 1120-1146 PT Time Calculation (min) (ACUTE ONLY): 26 min  Charges:  $Gait Training: 23-37 mins                    G Codes:      Jacqualyn Posey 05/03/2014, 1:11 PM  05/03/2014 Jacqualyn Posey PTA [phone] pager [phone] office

## 2014-05-03 NOTE — Progress Notes (Signed)
Occupational Therapy Treatment Patient Details Name: [name] MRN: [id number] DOB: 1950/11/05 Today's Date: 05/03/2014    History of present illness 63 yo male admiited with L side weakness with tingling, HA and dysarthria. 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 Multiple remote bilateral basal ganglia and RIGHT thalamus lacunar   OT comments  Pt progressing well with ADLs and balance. Pt expressing concern about LUE tingling and decreased strength. Pt educated and demonstrated fine motor coordination and strengthening exercises. Pt completed object retrieval task with L hand and folded laundry standing at counter without an assistive device. Pt states he will be borrowing a family member's RW and tub bench to increase safety at home.    Follow Up Recommendations  No OT follow up    Equipment Recommendations  None recommended by OT (Pt can use family member's tub bench and RW)    Recommendations for Other Services Other (comment)    Precautions / Restrictions Precautions Precautions: Fall Restrictions Weight Bearing Restrictions: No       Mobility Bed Mobility Overal bed mobility: Independent             General bed mobility comments: Pt in chair on OT arrival  Transfers Overall transfer level: Modified independent Equipment used: None Transfers: Sit to/from Stand Sit to Stand: Modified independent (Device/Increase time)         General transfer comment: Pt able to stand without assistive devices with no signs of LOB    Balance Overall balance assessment: Needs assistance Sitting-balance support: No upper extremity supported;Feet supported Sitting balance-Leahy Scale: Good     Standing balance support: No upper extremity supported;During functional activity Standing balance-Leahy Scale: Fair Standing balance comment: Pt able to complete object retrival tasks and laundry folding task without BUE support with no signs of LOB. Pt  requesting to use RW when ambulating in hallway during balance challenging tasks.                    ADL Overall ADL's : Needs assistance/impaired                                     Functional mobility during ADLs: Supervision/safety;Rolling walker General ADL Comments: Pt verbalized concern about LUE weakness and tingling. Pt states he has been dropping objects (cane, cups) since onset of symptoms. Pt educated on fine motor coordination and strengthening exercises. Pt completed object retrival and manipulation tasks with L hand and folded laundry demonstrating good dynamic standing balance. During ambulation in hallway pt requried verbal cues for safe RW use. Pt able to complete head turns in all directions, maintain conversation, and pretended to simulate running when seeing lunch cart. Pt verbalized concern about how to be safe during shower. Educated on using tub bench (borrowed from family member) and fall prevention tips while bathing.         Vision                     Perception     Praxis      Cognition   Behavior During Therapy: [facility] for tasks assessed/performed Overall Cognitive Status: Within Functional Limits for tasks assessed                       Extremity/Trunk Assessment               Exercises  Shoulder Instructions       General Comments      Pertinent Vitals/ Pain       Pain Assessment: No/denies pain  Home Living                                          Prior Functioning/Environment              Frequency Min 2X/week     Progress Toward Goals  OT Goals(current goals can now be found in the care plan section)  Progress towards OT goals: Progressing toward goals  Acute Rehab OT Goals Patient Stated Goal: to return to work OT Goal Formulation: With patient Time For Goal Achievement: 05/16/14 Potential to Achieve Goals: Good ADL Goals Pt Will Transfer to Toilet: with  modified independence;regular height toilet Pt Will Perform Tub/Shower Transfer: Tub transfer;ambulating;with supervision Additional ADL Goal #1: Pt will complete DGI with score > 24 demonstrating decr fall risk with ADLs  Plan Discharge plan remains appropriate    Co-evaluation                 End of Session Equipment Utilized During Treatment: Gait belt;Rolling walker   Activity Tolerance Patient tolerated treatment well   Patient Left in chair;with call bell/phone within reach   Nurse Communication Mobility status;Precautions        Time: 7829-5621 OT Time Calculation (min): 21 min  Charges:    Redmond Baseman 05/03/2014, 1:30 PM

## 2014-05-03 NOTE — Discharge Summary (Signed)
[name], 63 y.o., DOB 1950/07/29, MRN [id number]. Admission date: 05/01/2014 Discharge Date 05/03/2014 Primary MD Mack Hook, MD Admitting Physician Shanda Howells, MD  Admission Diagnosis  CVA (cerebral infarction) [I63.9] Stroke [I63.9]  Discharge Diagnosis   Active Problems:   CVA (cerebral infarction)   Type 2 diabetes mellitus with hyperglycemia   Type 2 diabetes mellitus with other circulatory complications   Essential hypertension   History of cocaine abuse   Atrial fibrillation, unspecified   Past Medical History  Diagnosis Date  . Hypertension   . Diabetes mellitus without complication   . Atrial fibrillation     History reviewed. No pertinent past surgical history.  Admission history of present illness/brief narrative: [name] is an 63 y.o. male with a past medical history significant for HTN, DM,questionable atrial fibrillation, brought in for further evaluation of left hemiparesis, unsteadiness, HA, dysarthria. Complains of having a dull HA but denies vertigo, double vision, difficulty swallowing, focal numbness, language or vision impairment. No chest pain, shortness of breath, or palpitations. CT brain revealed no acute abnormality.butMRI RIGHT basal ganglia/ corona radiata infarct. Multiple old bilateral basal ganglia and RIGHT thalamus lacunar infarcts. Chronic small vessel disease. Changes of chronic hypertension. Patient was seen by the neurology service, and cardiology service as well given his known history of A. Fib in the past, was started on Eliquis for anticoagulation, and will be discharged with outpatient PT.   Hospital Course See H&P, Labs, Consult and Test reports for all details in brief, patient was admitted for **  Active Problems:   CVA (cerebral infarction)   Type 2 diabetes mellitus with hyperglycemia   Type 2 diabetes mellitus with other circulatory complications   Essential hypertension   History of cocaine  abuse   Atrial fibrillation, unspecified  Acute CVA: -Still have mild left lower extremity hemiparesis. -MRI RIGHT basal ganglia/ corona radiata infarct. Multiple old bilateral basal ganglia and RIGHT thalamus lacunar infarcts. Chronic small vessel disease. Changes of chronic hypertension. -MRA no significant stenosis -LDL 63 , started on fenofibrate. -PT as an outpatien - he'll be discharged on Eliquis 5 mg oral twice a day given his history of A. Fib, follow with neurology as an outpatient.  Hypertension -Uncontrolled, allowed Initially for permissive hypertension, increased lisinopril on discharge to 20 mg oral twice a day.  Questionable history of A. Fib -seen by cardiology, and was started on Eliquis 5 mg BID on discharge. - was in NSR during hospitalization.  Diabetes mellitus -Uncontrolled -Hemoglobin A1c 8.9 -appears not to be taking any medication at home, will start on metformin 500 mg po BID on discharge.  Tobacco abuse -patient was counseled,   Consults   Neurology Cardiology  Significant Tests:  See full reports for all details    Dg Chest 2 View  05/01/2014   CLINICAL DATA:  LEFT-sided weakness beginning at 1530 hr today, suspect stroke.  EXAM: CHEST  2 VIEW  COMPARISON:  Chest radiograph January 11, 2013  FINDINGS: The cardiac silhouette remains mildly enlarged. Mediastinal silhouette is unremarkable. No pleural effusions or focal consolidations. Confluence of vascular and over osseous shadows in LEFT lung base. No pneumothorax. Soft tissue planes and included osseous structures are nonsuspicious.  IMPRESSION: Stable mild cardiomegaly, no acute pulmonary process.   Electronically Signed   By: Elon Alas   On: 05/01/2014 23:22   Ct Head Wo Contrast  05/01/2014   CLINICAL DATA:  Code stroke, left-sided weakness  EXAM: CT HEAD WITHOUT CONTRAST  TECHNIQUE: Contiguous axial images  were obtained from the base of the skull through the vertex without intravenous  contrast.  COMPARISON:  None.  FINDINGS: There is no evidence of mass effect, midline shift, or extra-axial fluid collections. There is no evidence of a space-occupying lesion or intracranial hemorrhage. There is no evidence of a cortical-based area of acute infarction. There are bilateral old basal ganglia lacunar infarcts. There is periventricular white matter low attenuation likely secondary to microangiopathy.  The ventricles and sulci are appropriate for the patient's age. The basal cisterns are patent.  Visualized portions of the orbits are unremarkable. There is not osteoma in the anterior right ethmoid sinus. There is a left mastoid effusion. Cerebrovascular atherosclerotic calcifications are noted.  The osseous structures are unremarkable.  IMPRESSION: No acute intracranial pathology.  These results were called by telephone at the time of interpretation on 05/01/2014 at 8:00 pm to Dr. Aram Beecham, who verbally acknowledged these results.   Electronically Signed   By: Kathreen Devoid   On: 05/01/2014 20:01   Mr Jodene Nam Head Wo Contrast  05/01/2014   CLINICAL DATA:  Acute onset LEFT arm and leg stiffness and gait imbalance 2 day well at work. Mild headache.  EXAM: MRI HEAD WITHOUT CONTRAST  MRA HEAD WITHOUT CONTRAST  TECHNIQUE: Multiplanar, multiecho pulse sequences of the brain and surrounding structures were obtained without intravenous contrast. Angiographic images of the head were obtained using MRA technique without contrast.  COMPARISON:  CT of the head May 01, 2014 at 1942 hr  FINDINGS: MRI HEAD FINDINGS  7 mm ovoid focus of reduced diffusion within the RIGHT corona radiata extending to the ipsilateral putaminal. Corresponding low ADC values. Subcentimeter bilateral remote cystic basal ganglia lacunar infarcts. Subcentimeter remote medial RIGHT thalamus lacunar infarct. Patchy to confluent supratentorial white matter FLAIR T2 hyperintensities seen. The ventricles and sulci are overall normal for  patient's age. No midline shift or mass effect. Scattered punctate foci of susceptibility artifact within the thalamus as well as periphery of the supratentorial brain.  No abnormal extra-axial fluid collections. LEFT mastoid effusion. Paranasal sinuses are are well-aerated. Ocular globes and orbital contents are nonsuspicious though not tailored for evaluation. No abnormal sellar expansion. No cerebellar tonsillar ectopia. No suspicious calvarial bone marrow signal.  MRA HEAD FINDINGS  Mild motion degraded examination.  Anterior circulation: Normal flow related enhancement of the included cervical, petrous, cavernous and supra clinoid internal carotid arteries. Patent anterior communicating artery. Normal flow related enhancement of the anterior and middle cerebral arteries, including more distal segments.  Posterior circulation: Codominant vertebral arteries. Basilar artery is patent, with normal flow related enhancement of the main branch vessels. Normal flow related enhancement of the posterior cerebral arteries.  No large vessel occlusion, hemodynamically significant stenosis, aneurysm within the anterior nor posterior circulation.  Mild luminal irregularity of the central vessels.  IMPRESSION: MRI HEAD: Acute subcentimeter RIGHT basal ganglia/ corona radiata infarct.  Multiple remote bilateral basal ganglia and RIGHT thalamus lacunar infarcts in a background of moderate to severe white matter changes suggesting chronic small vessel ischemic disease.  Scattered punctate foci of susceptibility artifact in a pattern suggesting sequelae of chronic hypertension.  LEFT mastoid effusion.  MRA HEAD: No acute vascular process or hemodynamically significant stenosis.  Mild luminal irregularity of the central vessels can be seen with atherosclerosis.  Acute findings discussed with and reconfirmed by Dr.OSVALDO CAMILO on 05/01/2014 at 9:49 pm.   Electronically Signed   By: Elon Alas   On: 05/01/2014 21:47   Mr  Brain Wo Contrast  05/01/2014   CLINICAL DATA:  Acute onset LEFT arm and leg stiffness and gait imbalance 2 day well at work. Mild headache.  EXAM: MRI HEAD WITHOUT CONTRAST  MRA HEAD WITHOUT CONTRAST  TECHNIQUE: Multiplanar, multiecho pulse sequences of the brain and surrounding structures were obtained without intravenous contrast. Angiographic images of the head were obtained using MRA technique without contrast.  COMPARISON:  CT of the head May 01, 2014 at 1942 hr  FINDINGS: MRI HEAD FINDINGS  7 mm ovoid focus of reduced diffusion within the RIGHT corona radiata extending to the ipsilateral putaminal. Corresponding low ADC values. Subcentimeter bilateral remote cystic basal ganglia lacunar infarcts. Subcentimeter remote medial RIGHT thalamus lacunar infarct. Patchy to confluent supratentorial white matter FLAIR T2 hyperintensities seen. The ventricles and sulci are overall normal for patient's age. No midline shift or mass effect. Scattered punctate foci of susceptibility artifact within the thalamus as well as periphery of the supratentorial brain.  No abnormal extra-axial fluid collections. LEFT mastoid effusion. Paranasal sinuses are are well-aerated. Ocular globes and orbital contents are nonsuspicious though not tailored for evaluation. No abnormal sellar expansion. No cerebellar tonsillar ectopia. No suspicious calvarial bone marrow signal.  MRA HEAD FINDINGS  Mild motion degraded examination.  Anterior circulation: Normal flow related enhancement of the included cervical, petrous, cavernous and supra clinoid internal carotid arteries. Patent anterior communicating artery. Normal flow related enhancement of the anterior and middle cerebral arteries, including more distal segments.  Posterior circulation: Codominant vertebral arteries. Basilar artery is patent, with normal flow related enhancement of the main branch vessels. Normal flow related enhancement of the posterior cerebral arteries.  No  large vessel occlusion, hemodynamically significant stenosis, aneurysm within the anterior nor posterior circulation.  Mild luminal irregularity of the central vessels.  IMPRESSION: MRI HEAD: Acute subcentimeter RIGHT basal ganglia/ corona radiata infarct.  Multiple remote bilateral basal ganglia and RIGHT thalamus lacunar infarcts in a background of moderate to severe white matter changes suggesting chronic small vessel ischemic disease.  Scattered punctate foci of susceptibility artifact in a pattern suggesting sequelae of chronic hypertension.  LEFT mastoid effusion.  MRA HEAD: No acute vascular process or hemodynamically significant stenosis.  Mild luminal irregularity of the central vessels can be seen with atherosclerosis.  Acute findings discussed with and reconfirmed by Dr.OSVALDO CAMILO on 05/01/2014 at 9:49 pm.   Electronically Signed   By: Elon Alas   On: 05/01/2014 21:47     Today   Subjective:   Nameer Ruthann Cancer today has no headache,no chest abdominal pain,no new weakness tingling or numbness, feels much better wants to go home today.   Objective:   Blood pressure 142/84, pulse 70, temperature 97.5 F (36.4 C), temperature source Oral, resp. rate 20, height 6\' 1"  (1.854 m), weight 98.93 kg (218 lb 1.6 oz), SpO2 99 %.  Intake/Output Summary (Last 24 hours) at 05/03/14 1216 Last data filed at 05/02/14 1739  Gross per 24 hour  Intake    480 ml  Output      0 ml  Net    480 ml    Exam Awake Alert, Oriented *3, No new F.N deficits, Normal affect [place].AT,PERRAL Supple Neck,No JVD, No cervical lymphadenopathy appriciated.  Symmetrical Chest wall movement, Good air movement bilaterally, CTAB RRR,No Gallops,Rubs or new Murmurs, No Parasternal Heave +ve B.Sounds, Abd Soft, Non tender, No organomegaly appriciated, No rebound -guarding or rigidity. No Cyanosis, Clubbing or edema, No new Rash or bruise, left lower extremity motor 4/ 5  Data Review   Cultures -  CBC w Diff:   Lab Results  Component Value Date   WBC 4.8 05/01/2014   HGB 13.6 05/01/2014   HCT 40.0 05/01/2014   PLT 219 05/01/2014   LYMPHOPCT 35 05/01/2014   MONOPCT 5 05/01/2014   EOSPCT 1 05/01/2014   BASOPCT 0 05/01/2014   CMP:  Lab Results  Component Value Date   NA 136* 05/01/2014   K 4.2 05/01/2014   CL 102 05/01/2014   CO2 23 05/01/2014   BUN 28* 05/01/2014   CREATININE 1.10 05/01/2014   PROT 7.0 05/01/2014   ALBUMIN 3.6 05/01/2014   BILITOT 0.2* 05/01/2014   ALKPHOS 78 05/01/2014   AST 22 05/01/2014   ALT 12 05/01/2014  .  Micro Results No results found for this or any previous visit (from the past 240 hour(s)).   Discharge Instructions     Follow-up with outpatient PT     Follow-up Information    Follow up with Richardson Dopp, PA-C On 05/30/2014.   Specialty:  Physician Assistant   Why:  2:20pm   Contact information:   [address] [address] [phone]       Follow up with [facility], MD. Schedule an appointment as soon as possible for a visit in 1 week.   Specialty:  Internal Medicine   Contact information:   [place] [address] [phone]       Follow up with Xu,Jindong, MD In 2 months.   Specialty:  Neurology   Why:  Stroke Clinic, Office will call you with appointment date & time   Contact information:   [address] [place] [id number] [phone]       Discharge Medications     Medication List    STOP taking these medications        aspirin 81 MG tablet     benzonatate 200 MG capsule  Commonly known as:  TESSALON     HYDROcodone-acetaminophen 5-325 MG per tablet  Commonly known as:  NORCO/VICODIN     meloxicam 15 MG tablet  Commonly known as:  MOBIC     metroNIDAZOLE 500 MG tablet  Commonly known as:  FLAGYL     naproxen sodium 220 MG tablet  Commonly known as:  ANAPROX     penicillin v potassium 500 MG tablet  Commonly known as:   VEETID     tobramycin 0.3 % ophthalmic solution  Commonly known as:  TOBREX      TAKE these medications        apixaban 5 MG Tabs tablet  Commonly known as:  ELIQUIS  Take 1 tablet (5 mg total) by mouth 2 (two) times daily.     fenofibrate 160 MG tablet  Take 1 tablet (160 mg total) by mouth daily.     lisinopril 10 MG tablet  Commonly known as:  PRINIVIL  Take 2 tablets (20 mg total) by mouth daily.     metFORMIN 500 MG tablet  Commonly known as:  GLUCOPHAGE  Take 1 tablet (500 mg total) by mouth 2 (two) times daily with a meal.         Total Time in preparing paper work, data evaluation and todays exam - 35 minutes  Ashira Kirsten M.D on 05/03/2014 at 12:16 PM  [place]  [phone]

## 2014-05-03 NOTE — Progress Notes (Signed)
Pharmacy note Re: Apixaban (Eliquis)  Discussed Apixaban use, precautions, potential interactions and monitoring with patient and wife.   Verbalized understanding. To begin after discharge.  Rx for 5 mg BID to be filled today, to begin tonight.  Kelvin Cellar, RPh Pager: [phone] 05/03/2014 4:09 PM

## 2014-05-03 NOTE — Progress Notes (Signed)
STROKE TEAM PROGRESS NOTE   HISTORY [name] is an 63 y.o. male with a past medical history significant for HTN, DM, atrial fibrillation, brought in for further evaluation of left hemiparesis, unsteadiness, HA, dysarthria. Stated that he never had similar symptoms before but today he was at work and when he tried to walk the left leg was " heavy like plastic" and became very off balance. He said that subsequently the left arm was also clumsy and he was constantly drooping things from his left hand. Of note, he said that his speech was " slurred for a short period of time even before everything else started".  Complains of having a dull HA but denies vertigo, double vision, difficulty swallowing, focal numbness, language or vision impairment. No chest pain, shortness of breath, or palpitations. CT brain revealed no acute abnormality. NIHSS 5. He was last known well 05/01/14 at  3:30 PM Patient was not administered TPA secondary to delay in arrival. He was admitted for further evaluation and treatment.   SUBJECTIVE (INTERVAL HISTORY) RN and Advertising copywriter at bedside. No family present. Cardiology consult recommends anticoagulation. Patient is anxious for discharge. Medication compliance was emphasesd with pt during rounds.  OBJECTIVE Recent Labs Lab 05/02/14 1617 05/02/14 2002 05/03/14 05/03/14 0354 05/03/14 0745  GLUCAP 178* 197* 123* 133* 132*    Recent Labs Lab 05/01/14 1946 05/01/14 1956  NA 135* 136*  K 4.5 4.2  CL 98 102  CO2 23  --   GLUCOSE 263* 281*  BUN 25* 28*  CREATININE 1.06 1.10  CALCIUM 8.8  --     Recent Labs Lab 05/01/14 1946  AST 22  ALT 12  ALKPHOS 78  BILITOT 0.2*  PROT 7.0  ALBUMIN 3.6    Recent Labs Lab 05/01/14 1946 05/01/14 1956  WBC 4.8  --   NEUTROABS 2.8  --   HGB 13.1 13.6  HCT 36.7* 40.0  MCV 86.8  --   PLT 219  --    No results for input(s): CKTOTAL, CKMB, CKMBINDEX, TROPONINI in the last 168 hours.  Recent Labs   05/01/14 1946  LABPROT 12.0  INR 0.88   No results for input(s): COLORURINE, LABSPEC, PHURINE, GLUCOSEU, HGBUR, BILIRUBINUR, KETONESUR, PROTEINUR, UROBILINOGEN, NITRITE, LEUKOCYTESUR in the last 72 hours.  Invalid input(s): APPERANCEUR     Component Value Date/Time   CHOL 177 05/02/2014 0000   TRIG 359* 05/02/2014 0000   HDL 42 05/02/2014 0000   CHOLHDL 4.2 05/02/2014 0000   VLDL 72* 05/02/2014 0000   LDLCALC 63 05/02/2014 0000   Lab Results  Component Value Date   HGBA1C 8.9* 05/01/2014      Component Value Date/Time   LABOPIA NONE DETECTED 05/02/2014 0342   COCAINSCRNUR NONE DETECTED 05/02/2014 0342   LABBENZ NONE DETECTED 05/02/2014 0342   AMPHETMU NONE DETECTED 05/02/2014 0342   THCU NONE DETECTED 05/02/2014 0342   LABBARB NONE DETECTED 05/02/2014 0342    No results for input(s): ETH in the last 168 hours.   Dg Chest 2 View  05/01/2014   IMPRESSION: Stable mild cardiomegaly, no acute pulmonary process.     Ct Head Wo Contrast  05/01/2014   IMPRESSION: No acute intracranial pathology.     Mri and Mra Head Wo Contrast  05/01/2014   IMPRESSION: MRI HEAD: Acute subcentimeter RIGHT basal ganglia/ corona radiata infarct.  Multiple remote bilateral basal ganglia and RIGHT thalamus lacunar infarcts in a background of moderate to severe white matter changes suggesting chronic small vessel ischemic  disease.  Scattered punctate foci of susceptibility artifact in a pattern suggesting sequelae of chronic hypertension.  LEFT mastoid effusion.  MRA HEAD: No acute vascular process or hemodynamically significant stenosis.  Mild luminal irregularity of the central vessels can be seen with atherosclerosis.   EKG 05/01/14 - Unknown rhythm, irregular rate, Borderline T wave abnormalities, Minimal ST elevation, anterior leads  CUS Bilateral: 1-39% ICA stenosis. Vertebral artery flow is antegrade.  2D echo Normal LV size and systolic function, EF 32-99%. Mild LVH. Normal RV size and  systolic function. No significant valvular abnormalities.   PHYSICAL EXAM Temp:  [97.5 F (36.4 C)-98.5 F (36.9 C)] 97.5 F (36.4 C) (11/12 1028) Pulse Rate:  [59-70] 70 (11/12 1028) Resp:  [16-20] 20 (11/12 1028) BP: (142-171)/(84-100) 142/84 mmHg (11/12 1028) SpO2:  [96 %-100 %] 99 % (11/12 1028)  General - Well nourished, well developed, in no apparent distress.  Ophthalmologic - not able to see through.  Cardiovascular - Regular rate and rhythm with no murmur.  Mental Status -  Level of arousal and orientation to time, place, and person were intact. Language including expression, naming, repetition, comprehension was assessed and found intact. Attention span and concentration were normal. Fund of Knowledge was assessed and was intact.  Cranial Nerves II - XII - II - Visual field intact OU. III, IV, VI - Extraocular movements intact. V - Facial sensation intact bilaterally. VII - Facial movement intact bilaterally. VIII - Hearing & vestibular intact bilaterally. X - Palate elevates symmetrically. XI - Chin turning & shoulder shrug intact bilaterally. XII - Tongue protrusion intact.  Motor Strength - The patient's strength was normal in all extremities except LLE 4+/5 proximal and pronator drift was absent.  Bulk was normal and fasciculations were absent.   Motor Tone - Muscle tone was assessed at the neck and appendages and was normal.  Reflexes - The patient's reflexes were normal in all extremities and he had no pathological reflexes.  Sensory - Light touch, temperature/pinprickwere assessed and were normal. Romberg testing showed teetering but no fall.  Coordination - The patient had normal movements in the hands and feet with no ataxia or dysmetria.  Tremor was absent.  Gait and Station - The patient's transfers, posture, gait, station, and turns were observed as normal.   ASSESSMENT/PLAN [name] is a 63 y.o. male with history of HTN, DM,  smoker, previous cocaine use and hx of afib presenting with left hemiparesis, unsteadiness, HA, dysarthria. He did not receive IV t-PA due to delay in arrival.   Stroke:  Non-dominant right basal ganglia infarct secondary to small vessel disease due to risk factors  Resultant  RLE prox 4+/5 hemiparesis  MRI  RIGHT basal ganglia/ corona radiata infarct.  Multiple old bilateral basal ganglia and RIGHT thalamus lacunar infarcts. Chronic small vessel disease.  Changes of chronic hypertension. MRI findings consistent with small vessel stroke likely due to his risk factors including HTN, DM, smoking and cocaine use.   MRA  No significant stenosis   Carotid Doppler  unremarkable    2D Echo  No source of embolus   LDL 63, at the goal < 70  A1C 8.9, not meet the goal < 6.5  Lovenox 30 mg sq daily for VTE prophylaxis  Diet heart healthy/carb modified thin liquids  aspirin 81 mg orally every day prior to admission, now on aspirin 325 mg orally every day. Agree with recommendations to change to NOAC. Ok to start today.  Ongoing aggressive risk  factor management  Pt counseled to be compliant with medication  Therapy recommendations:  OP PT   Disposition:  home  Atrial Fibrillation  Hx cocaine use, he thinks his dx of atrial fibrillation was related to cocaine use. He reports a "change in life style" has led to it no longer being a problem. However, discharge note from 05/21/2005 reports new onset afib with Dr. Wynonia Lawman consulted who wanted to start him on lovenox, though per d/c note no antiplatelet/anticoagulant was listed (as per chart, pt had been off cocaine x 1 yr at that time).   Home meds:  No anticoagulation  Current stroke is not felt to be related to afib   However, given hx of atrial fibrillation, that makes his CHA2DS2-VASc Score = 4, ?2 oral anticoagulation recommended  Age in Years:  <65   0    Sex:  Male   0    Hypertension History:  yes   +1     Diabetes Mellitus:  yes    +1   Congestive Heart Failure History:  0  Vascular Disease History:  0    Stroke/TIA/Thromboembolism History:  yes   +2  2D without valvular disease  Cardiology recommends anticoagulation.  Ok to start eliquis today given small size of stroke   Accelerated Hypertension  Home meds:   prinvil  Permissive hypertension (OK if < 220/120) for 24-48 hours and then gradually normalize in 5-7 days  Patient counseled to remain compliant with his blood pressure medications  Diabetes  HgbA1c 8.9 goal < 7.0  Uncontrolled  Pt stated that he was not compliant with insulin at home  DM education  Manage per primary team  Tobacco abuse  current smoker, 20+ history  3PPD in the past, currently 3 cig per day  smoking cessation counseling provided  Pt is willing to quit  Other Stroke Risk Factors  Hx cocaine use, urine tox negative this time  Obesity, Body mass index is 28.78 kg/(m^2).   Hospital day # 2  Burnetta Sabin, MSN, APRN, ANVP-BC, AGPCNP-BC [facility] Pager: [phone] 05/03/2014 11:12 AM   I, the attending vascular neurologist, have personally obtained a history, examined the patient, evaluated laboratory data, individually viewed imaging studies. Together with the NP/PA, we formulated the assessment and plan of care which reflects our mutual decision.  I have made any additions or clarifications directly to the above note and agree with the findings and plan as currently documented.   Neurology will sign off. Please call with questions. Pt will follow up with Dr. Erlinda Hong at [facility] in about 2 months. Thanks for the consult.  Rosalin Hawking, MD PhD Stroke Neurology 05/03/2014 2:37 PM      To contact Stroke Continuity provider, please refer to [email or web address] After hours, contact General Neurology

## 2014-05-03 NOTE — Progress Notes (Signed)
*  PRELIMINARY RESULTS* Vascular Ultrasound Carotid Duplex (Doppler) has been completed.  Findings suggest 1-39% internal carotid artery stenosis bilaterally. Vertebral arteries are patent with antegrade flow.  05/03/2014 2:16 PM Maudry Mayhew, RVT, RDCS, RDMS

## 2014-05-03 NOTE — Progress Notes (Signed)
Discharge orders received. Pt for discharge home today. IV d/c'd. Pt given discharge instructions and prescriptions with verbalized understanding. Family in room to assist with discharge. Staff brought pt downstairs via wheelchair.   

## 2014-05-03 NOTE — Progress Notes (Signed)
Patient does not have a PCP; referral made to the [facility] and [facility] - unable to make an apt and was informed by [facility] to have the patient call the Clinic tomorrow morning to be placed on the walk in list; information given to the patient and he stated that he will call them tomorrow; Mindi Slicker RN,BSN,MHA [phone]

## 2014-05-03 NOTE — Progress Notes (Signed)
Inpatient Diabetes Program Recommendations  AACE/ADA: New Consensus Statement on Inpatient Glycemic Control (2013)  Target Ranges:  Prepandial:   less than 140 mg/dL      Peak postprandial:   less than 180 mg/dL (1-2 hours)      Critically ill patients:  140 - 180 mg/dL   Reason for Assessment:  Results for ROWEN, WILMER (MRN [id number]) as of 05/03/2014 14:58  Ref. Range 05/02/2014 20:02 05/03/2014 00:00 05/03/2014 03:54 05/03/2014 07:45 05/03/2014 12:04  Glucose-Capillary Latest Range: 70-99 mg/dL 197 (H) 123 (H) 133 (H) 132 (H) 112 (H)  Results for KYLEE, NARDOZZI (MRN [id number]) as of 05/03/2014 14:58  Ref. Range 05/01/2014 20:37  Hgb A1c MFr Bld Latest Range: <5.7 % 8.9 (H)   Diabetes history:Type 2 diabetes per history.  However after talking to patient he seemed surprised by the diagnosis.  He states that his father had Type 2 diabetes so he is familiar with monitoring/eating right.   Outpatient Diabetes medications: None Current orders for Inpatient glycemic control: Novolog sensitive q 4 hours, Metformin 500 mg bid  Spoke with patient regarding A1C, and he states "So I have Diabetes?".  Discussed A1C results, causes of Type 2 diabetes and Standards of care.  Also discussed dietary modifications for diabetes.  Patient states that he drinks lots of juice, hawaiian punch, and lemonade.  Discussed high sugar content and little nutritional value of these drinks.  Encouraged water and crystal light if flavor needed.  We also discussed CHO content of some of his favorite foods.  He seems willing to make changes and surprised about the information that I gave him.  He does not have a PCP since [facility] closed.  Called Case management and asked her to follow-up regarding PCP/appointment.  Discussed glucose meter from [facility] or getting meter from clinic.  Patient had great questions regarding diabetes management.  Gave him pamphlets on Diabetes standards of care, healthy  eating, and "Nutrition in the [place]".  Patient was appreciative of information.  Thanks, Adah Perl, RN, BC-ADM Inpatient Diabetes Coordinator Pager [phone]

## 2014-05-03 NOTE — Progress Notes (Signed)
Patient Name: [name] Date of Encounter: 05/03/2014     Active Problems:   CVA (cerebral infarction)   Type 2 diabetes mellitus with hyperglycemia   Type 2 diabetes mellitus with other circulatory complications   Essential hypertension   History of cocaine abuse   Atrial fibrillation, unspecified    SUBJECTIVE  Denies any CP or SOB  CURRENT MEDS . aspirin EC  325 mg Oral Daily  . enoxaparin (LOVENOX) injection  30 mg Subcutaneous Q24H  . fenofibrate  160 mg Oral Daily  . insulin aspart  0-9 Units Subcutaneous 6 times per day  . lisinopril  10 mg Oral Daily  . metFORMIN  500 mg Oral BID WC    OBJECTIVE  Filed Vitals:   05/02/14 1730 05/02/14 2037 05/03/14 0136 05/03/14 0525  BP: 171/90 157/95 168/94 143/98  Pulse: 59 61 65 59  Temp: 97.6 F (36.4 C) 98.5 F (36.9 C) 98.5 F (36.9 C) 98.1 F (36.7 C)  TempSrc: Oral Oral Oral Oral  Resp: 20 20 18 20   Height:      Weight:      SpO2: 99% 96% 100% 98%    Intake/Output Summary (Last 24 hours) at 05/03/14 1025 Last data filed at 05/02/14 1739  Gross per 24 hour  Intake    480 ml  Output      0 ml  Net    480 ml   Filed Weights   05/01/14 1900 05/01/14 2345  Weight: 218 lb 11.1 oz (99.2 kg) 218 lb 1.6 oz (98.93 kg)    PHYSICAL EXAM  General: Pleasant, NAD. Neuro: Alert and oriented X 3. Moves all extremities spontaneously. Psych: Normal affect. HEENT:  Normal  Neck: Supple without bruits or JVD. Lungs:  Resp regular and unlabored, CTA. Heart: RRR no s3, s4, or murmurs. Abdomen: Soft, non-tender, non-distended, BS + x 4.  Extremities: No clubbing, cyanosis or edema. DP/PT/Radials 2+ and equal bilaterally.  Accessory Clinical Findings  CBC  Recent Labs  05/01/14 1946 05/01/14 1956  WBC 4.8  --   NEUTROABS 2.8  --   HGB 13.1 13.6  HCT 36.7* 40.0  MCV 86.8  --   PLT 219  --    Basic Metabolic Panel  Recent Labs  05/01/14 1946 05/01/14 1956  NA 135* 136*  K 4.5 4.2  CL 98  102  CO2 23  --   GLUCOSE 263* 281*  BUN 25* 28*  CREATININE 1.06 1.10  CALCIUM 8.8  --    Liver Function Tests  Recent Labs  05/01/14 1946  AST 22  ALT 12  ALKPHOS 78  BILITOT 0.2*  PROT 7.0  ALBUMIN 3.6   Hemoglobin A1C  Recent Labs  05/01/14 2037  HGBA1C 8.9*   Fasting Lipid Panel  Recent Labs  05/02/14  CHOL 177  HDL 42  LDLCALC 63  TRIG 359*  CHOLHDL 4.2    TELE NSR with HR 50-70s, no significant ventricular ectopy or a-fib over night.    ECG  No new EKG  Echocardiogram 05/02/2014  LV EF: 60% -  65%  ------------------------------------------------------------------- Indications:   CVA 436.  ------------------------------------------------------------------- History:  PMH: Degenerative joint disease. Risk factors: Current tobacco use. Hypertension. Diabetes mellitus. Dyslipidemia.  ------------------------------------------------------------------- Study Conclusions  - Procedure narrative: Transthoracic echocardiography. Image quality was adequate. The study was technically difficult due to variations in respirations. - Left ventricle: The cavity size was normal. Wall thickness was increased in a pattern of mild LVH. Systolic function was  normal. The estimated ejection fraction was in the range of 60% to 65%. Wall motion was normal; there were no regional wall motion abnormalities. Doppler parameters are consistent with abnormal left ventricular relaxation (grade 1 diastolic dysfunction). - Aortic valve: There was no stenosis. - Mitral valve: There was no significant regurgitation. - Right ventricle: The cavity size was normal. Systolic function was normal. - Pulmonary arteries: No complete TR doppler jet so unable to estimate PA systolic pressure. - Inferior vena cava: The vessel was normal in size. The respirophasic diameter changes were in the normal range (>= 50%), consistent with normal central venous  pressure.  Impressions:  - Normal LV size and systolic function, EF 15-17%. Mild LVH. Normal RV size and systolic function. No significant valvular abnormalities.    Radiology/Studies  Dg Chest 2 View  05/01/2014   CLINICAL DATA:  LEFT-sided weakness beginning at 1530 hr today, suspect stroke.  EXAM: CHEST  2 VIEW  COMPARISON:  Chest radiograph January 11, 2013  FINDINGS: The cardiac silhouette remains mildly enlarged. Mediastinal silhouette is unremarkable. No pleural effusions or focal consolidations. Confluence of vascular and over osseous shadows in LEFT lung base. No pneumothorax. Soft tissue planes and included osseous structures are nonsuspicious.  IMPRESSION: Stable mild cardiomegaly, no acute pulmonary process.   Electronically Signed   By: Elon Alas   On: 05/01/2014 23:22   Ct Head Wo Contrast  05/01/2014   CLINICAL DATA:  Code stroke, left-sided weakness  EXAM: CT HEAD WITHOUT CONTRAST  TECHNIQUE: Contiguous axial images were obtained from the base of the skull through the vertex without intravenous contrast.  COMPARISON:  None.  FINDINGS: There is no evidence of mass effect, midline shift, or extra-axial fluid collections. There is no evidence of a space-occupying lesion or intracranial hemorrhage. There is no evidence of a cortical-based area of acute infarction. There are bilateral old basal ganglia lacunar infarcts. There is periventricular white matter low attenuation likely secondary to microangiopathy.  The ventricles and sulci are appropriate for the patient's age. The basal cisterns are patent.  Visualized portions of the orbits are unremarkable. There is not osteoma in the anterior right ethmoid sinus. There is a left mastoid effusion. Cerebrovascular atherosclerotic calcifications are noted.  The osseous structures are unremarkable.  IMPRESSION: No acute intracranial pathology.  These results were called by telephone at the time of interpretation on 05/01/2014 at 8:00  pm to Dr. Aram Beecham, who verbally acknowledged these results.   Electronically Signed   By: Kathreen Devoid   On: 05/01/2014 20:01   Mr Jodene Nam Head Wo Contrast  05/01/2014   CLINICAL DATA:  Acute onset LEFT arm and leg stiffness and gait imbalance 2 day well at work. Mild headache.  EXAM: MRI HEAD WITHOUT CONTRAST  MRA HEAD WITHOUT CONTRAST  TECHNIQUE: Multiplanar, multiecho pulse sequences of the brain and surrounding structures were obtained without intravenous contrast. Angiographic images of the head were obtained using MRA technique without contrast.  COMPARISON:  CT of the head May 01, 2014 at 1942 hr  FINDINGS: MRI HEAD FINDINGS  7 mm ovoid focus of reduced diffusion within the RIGHT corona radiata extending to the ipsilateral putaminal. Corresponding low ADC values. Subcentimeter bilateral remote cystic basal ganglia lacunar infarcts. Subcentimeter remote medial RIGHT thalamus lacunar infarct. Patchy to confluent supratentorial white matter FLAIR T2 hyperintensities seen. The ventricles and sulci are overall normal for patient's age. No midline shift or mass effect. Scattered punctate foci of susceptibility artifact within the thalamus as well as periphery of  the supratentorial brain.  No abnormal extra-axial fluid collections. LEFT mastoid effusion. Paranasal sinuses are are well-aerated. Ocular globes and orbital contents are nonsuspicious though not tailored for evaluation. No abnormal sellar expansion. No cerebellar tonsillar ectopia. No suspicious calvarial bone marrow signal.  MRA HEAD FINDINGS  Mild motion degraded examination.  Anterior circulation: Normal flow related enhancement of the included cervical, petrous, cavernous and supra clinoid internal carotid arteries. Patent anterior communicating artery. Normal flow related enhancement of the anterior and middle cerebral arteries, including more distal segments.  Posterior circulation: Codominant vertebral arteries. Basilar artery is patent, with  normal flow related enhancement of the main branch vessels. Normal flow related enhancement of the posterior cerebral arteries.  No large vessel occlusion, hemodynamically significant stenosis, aneurysm within the anterior nor posterior circulation.  Mild luminal irregularity of the central vessels.  IMPRESSION: MRI HEAD: Acute subcentimeter RIGHT basal ganglia/ corona radiata infarct.  Multiple remote bilateral basal ganglia and RIGHT thalamus lacunar infarcts in a background of moderate to severe white matter changes suggesting chronic small vessel ischemic disease.  Scattered punctate foci of susceptibility artifact in a pattern suggesting sequelae of chronic hypertension.  LEFT mastoid effusion.  MRA HEAD: No acute vascular process or hemodynamically significant stenosis.  Mild luminal irregularity of the central vessels can be seen with atherosclerosis.  Acute findings discussed with and reconfirmed by Dr.OSVALDO CAMILO on 05/01/2014 at 9:49 pm.   Electronically Signed   By: Elon Alas   On: 05/01/2014 21:47   Mr Brain Wo Contrast  05/01/2014   CLINICAL DATA:  Acute onset LEFT arm and leg stiffness and gait imbalance 2 day well at work. Mild headache.  EXAM: MRI HEAD WITHOUT CONTRAST  MRA HEAD WITHOUT CONTRAST  TECHNIQUE: Multiplanar, multiecho pulse sequences of the brain and surrounding structures were obtained without intravenous contrast. Angiographic images of the head were obtained using MRA technique without contrast.  COMPARISON:  CT of the head May 01, 2014 at 1942 hr  FINDINGS: MRI HEAD FINDINGS  7 mm ovoid focus of reduced diffusion within the RIGHT corona radiata extending to the ipsilateral putaminal. Corresponding low ADC values. Subcentimeter bilateral remote cystic basal ganglia lacunar infarcts. Subcentimeter remote medial RIGHT thalamus lacunar infarct. Patchy to confluent supratentorial white matter FLAIR T2 hyperintensities seen. The ventricles and sulci are overall normal  for patient's age. No midline shift or mass effect. Scattered punctate foci of susceptibility artifact within the thalamus as well as periphery of the supratentorial brain.  No abnormal extra-axial fluid collections. LEFT mastoid effusion. Paranasal sinuses are are well-aerated. Ocular globes and orbital contents are nonsuspicious though not tailored for evaluation. No abnormal sellar expansion. No cerebellar tonsillar ectopia. No suspicious calvarial bone marrow signal.  MRA HEAD FINDINGS  Mild motion degraded examination.  Anterior circulation: Normal flow related enhancement of the included cervical, petrous, cavernous and supra clinoid internal carotid arteries. Patent anterior communicating artery. Normal flow related enhancement of the anterior and middle cerebral arteries, including more distal segments.  Posterior circulation: Codominant vertebral arteries. Basilar artery is patent, with normal flow related enhancement of the main branch vessels. Normal flow related enhancement of the posterior cerebral arteries.  No large vessel occlusion, hemodynamically significant stenosis, aneurysm within the anterior nor posterior circulation.  Mild luminal irregularity of the central vessels.  IMPRESSION: MRI HEAD: Acute subcentimeter RIGHT basal ganglia/ corona radiata infarct.  Multiple remote bilateral basal ganglia and RIGHT thalamus lacunar infarcts in a background of moderate to severe white matter changes suggesting chronic small vessel  ischemic disease.  Scattered punctate foci of susceptibility artifact in a pattern suggesting sequelae of chronic hypertension.  LEFT mastoid effusion.  MRA HEAD: No acute vascular process or hemodynamically significant stenosis.  Mild luminal irregularity of the central vessels can be seen with atherosclerosis.  Acute findings discussed with and reconfirmed by Dr.OSVALDO CAMILO on 05/01/2014 at 9:49 pm.   Electronically Signed   By: Elon Alas   On: 05/01/2014 21:47     ASSESSMENT AND PLAN  63 yo male with PMH of HTN, DM, tobacco abuse and h/o a-fib admitted on 11/11 with acute R basal ganglia infarct. Cardiology consulted for systemic anticoagulation  1. Acute right basal ganglia/ corona radiata infarct  2. PAF  - CHADS-VASc score is 4  - Echo 05/02/2014 EF 07-86%, grade 1 diastolic dysfunct, no significant valvular dx  - h/o PAF in 2006, however failed to follow with cardiology to initiate systemic anticoagulation  - avoid BB with baseline bradycardia  - recommend eliquis 5mg  BID when cleared by neurology  - will schedule followup with Dr. Meda Coffee.   3. HTN: uncontrolled in 150-170s, per neurology, permissive htn for 24-48hrs, after which consider increase lisinopril to 20mg  daily 4. HLD: continue finofibrate 5. Tobacco abuse 6. Remote h/o concaine use   Signed, Woodward Ku Pager: [phone]  Patient examined chart reviewed  Agree with beginning eliquis  Labile BP post CVA  Can increase lisinopril if needed Outpatient f/u Dr Orion Modest

## 2014-05-03 NOTE — Discharge Instructions (Signed)
Follow with Primary MD MULBERRY,ELIZABETH, MD in 5 days   Get CBC, CMP, 2 view Chest X ray checked  by Primary MD next visit.    Activity: As tolerated with Full fall precautions use walker/cane & assistance as needed   Disposition Home    Diet: Heart Healthy /carbohydrate modified , with feeding assistance and aspiration precautions as needed.  For Heart failure patients - Check your Weight same time everyday, if you gain over 2 pounds, or you develop in leg swelling, experience more shortness of breath or chest pain, call your Primary MD immediately. Follow Cardiac Low Salt Diet and 1.8 lit/day fluid restriction.   On your next visit with your primary care physician please Get Medicines reviewed and adjusted.   Please request your Prim.MD to go over all Hospital Tests and Procedure/Radiological results at the follow up, please get all Hospital records sent to your Prim MD by signing hospital release before you go home.   If you experience worsening of your admission symptoms, develop shortness of breath, life threatening emergency, suicidal or homicidal thoughts you must seek medical attention immediately by calling 911 or calling your MD immediately  if symptoms less severe.  You Must read complete instructions/literature along with all the possible adverse reactions/side effects for all the Medicines you take and that have been prescribed to you. Take any new Medicines after you have completely understood and accpet all the possible adverse reactions/side effects.   Do not drive, operating heavy machinery, perform activities at heights, swimming or participation in water activities or provide baby sitting services if your were admitted for syncope or siezures until you have seen by Primary MD or a Neurologist and advised to do so again.  Do not drive when taking Pain medications.    Do not take more than prescribed Pain, Sleep and Anxiety Medications  Special Instructions: If you  have smoked or chewed Tobacco  in the last 2 yrs please stop smoking, stop any regular Alcohol  and or any Recreational drug use.  Wear Seat belts while driving.   Please note  You were cared for by a hospitalist during your hospital stay. If you have any questions about your discharge medications or the care you received while you were in the hospital after you are discharged, you can call the unit and asked to speak with the hospitalist on call if the hospitalist that took care of you is not available. Once you are discharged, your primary care physician will handle any further medical issues. Please note that NO REFILLS for any discharge medications will be authorized once you are discharged, as it is imperative that you return to your primary care physician (or establish a relationship with a primary care physician if you do not have one) for your aftercare needs so that they can reassess your need for medications and monitor your lab values.  --------------  Information on my medicine - ELIQUIS (apixaban)  This medication education was reviewed with me or my healthcare representative as part of my discharge preparation.  The pharmacist that spoke with me during my hospital stay was:  Arty Baumgartner, [facility]  Why was Eliquis prescribed for you? Eliquis was prescribed for you to reduce the risk of a blood clot forming that can cause a stroke if you have a medical condition called atrial fibrillation (a type of irregular heartbeat).  What do You need to know about Eliquis ? Take your 5 mg Eliquis   5 mg TWICE DAILY -  one tablet in the morning and one tablet in the evening with or without food. If you have difficulty swallowing the tablet whole please discuss with your pharmacist how to take the medication safely.  Take Eliquis exactly as prescribed by your doctor and DO NOT stop taking Eliquis without talking to the doctor who prescribed the medication.  Stopping may increase your  risk of developing a stroke.  Refill your prescription before you run out.  After discharge, you should have regular check-up appointments with your healthcare provider that is prescribing your Eliquis.  In the future your dose may need to be changed if your kidney function or weight changes by a significant amount or as you get older.  What do you do if you miss a dose? If you miss a dose, take it as soon as you remember on the same day and resume taking twice daily.  Do not take more than one dose of ELIQUIS at the same time to make up a missed dose.  Important Safety Information A possible side effect of Eliquis is bleeding. You should call your healthcare provider right away if you experience any of the following: ? Bleeding from an injury or your nose that does not stop. ? Unusual colored urine (red or dark brown) or unusual colored stools (red or black). ? Unusual bruising for unknown reasons. ? A serious fall or if you hit your head (even if there is no bleeding).  Some medicines may interact with Eliquis and might increase your risk of bleeding or clotting while on Eliquis. To help avoid this, consult your healthcare provider or pharmacist prior to using any new prescription or non-prescription medications, including herbals, vitamins, non-steroidal anti-inflammatory drugs (NSAIDs) and supplements.  This website has more information on Eliquis (apixaban): http://www.eliquis.com/eliquis/home

## 2014-05-15 ENCOUNTER — Encounter: Payer: Self-pay | Admitting: Family Medicine

## 2014-05-15 ENCOUNTER — Ambulatory Visit: Payer: BC Managed Care – PPO | Attending: Family Medicine | Admitting: Family Medicine

## 2014-05-15 VITALS — BP 168/91 | HR 87 | Temp 98.3°F | Resp 18 | Ht 73.0 in | Wt 221.0 lb

## 2014-05-15 DIAGNOSIS — F17210 Nicotine dependence, cigarettes, uncomplicated: Secondary | ICD-10-CM | POA: Insufficient documentation

## 2014-05-15 DIAGNOSIS — E119 Type 2 diabetes mellitus without complications: Secondary | ICD-10-CM | POA: Insufficient documentation

## 2014-05-15 DIAGNOSIS — I639 Cerebral infarction, unspecified: Secondary | ICD-10-CM | POA: Insufficient documentation

## 2014-05-15 DIAGNOSIS — G8194 Hemiplegia, unspecified affecting left nondominant side: Secondary | ICD-10-CM | POA: Insufficient documentation

## 2014-05-15 DIAGNOSIS — Z7901 Long term (current) use of anticoagulants: Secondary | ICD-10-CM | POA: Insufficient documentation

## 2014-05-15 DIAGNOSIS — E1165 Type 2 diabetes mellitus with hyperglycemia: Secondary | ICD-10-CM

## 2014-05-15 DIAGNOSIS — E1149 Type 2 diabetes mellitus with other diabetic neurological complication: Secondary | ICD-10-CM

## 2014-05-15 DIAGNOSIS — I6309 Cerebral infarction due to thrombosis of other precerebral artery: Secondary | ICD-10-CM

## 2014-05-15 DIAGNOSIS — I10 Essential (primary) hypertension: Secondary | ICD-10-CM

## 2014-05-15 LAB — GLUCOSE, POCT (MANUAL RESULT ENTRY): POC Glucose: 161 mg/dl — AB (ref 70–99)

## 2014-05-15 MED ORDER — METFORMIN HCL ER 500 MG PO TB24: 1000.0000 mg | ORAL_TABLET | Freq: Two times a day (BID) | ORAL | Status: DC

## 2014-05-15 MED ORDER — LISINOPRIL 40 MG PO TABS: 40.0000 mg | ORAL_TABLET | Freq: Every day | ORAL | Status: DC

## 2014-05-15 MED ORDER — GLUCOSE BLOOD VI STRP: ORAL_STRIP | Status: DC

## 2014-05-15 MED ORDER — FREESTYLE SYSTEM KIT: 1.0000 | PACK | Status: DC | PRN

## 2014-05-15 MED ORDER — GLUCOCOM LANCETS 28G MISC: 100.0000 [IU] | Freq: Once | Status: DC

## 2014-05-15 NOTE — Assessment & Plan Note (Signed)
Referral to rehab

## 2014-05-15 NOTE — Progress Notes (Signed)
   Subjective:     Patient ID: [name], male    DOB: [birth year], 63 y.o.   MRN: [id number] CC: HFU for stroke HPI 63 yo presents to establish care  1. CVA: R basal gangila/corona radiata infarct, 05/01/14. CVA with residual L forearm and L anterior quad and L lateral leg numbness. L leg weakness. L hand weakness. In the setting of uncontrolled HTN, DM2, ? A fib. Patient compliant with medication regimen. Experiencing some diarrhea. 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 Also has to lift his client at times, 120-130#.   Soc Hx: current smoker, 3 cigarettes since he left the hospital  Med Hx: HTN  Fam Hx:  DM2 in mother and father  Review of Systems As per HPI  GAD 7 score of 19.     Objective:   Physical Exam BP 168/91 mmHg  Pulse 87  Temp(Src) 98.3 F (36.8 C) (Oral)  Resp 18  Ht 6\' 1"  (1.854 m)  Wt 221 lb (100.245 kg)  BMI 29.16 kg/m2  SpO2 96%  BP Readings from Last 3 Encounters:  05/15/14 174/87  05/03/14 138/80  01/11/13 176/94  General appearance: alert, cooperative and no distress Lungs: clear to auscultation bilaterally Heart: regular rate and rhythm, S1, S2 normal, no murmur, click, rub or gallop Neurologic: Gait: Abnormal, favors R side, L foot drop mild    Lab Results  Component Value Date   HGBA1C 8.9* 05/01/2014   CBG: 161    Assessment & Plan:

## 2014-05-15 NOTE — Progress Notes (Signed)
Establish Care HFU Stroke Stated MRI results show had more then two stroke in the past Pt stated took BP medication at 8:am

## 2014-05-15 NOTE — Assessment & Plan Note (Signed)
A: improved CBGs on metformin. Having diarrhea P: Change to metformin XR 1000 mg BID

## 2014-05-15 NOTE — Assessment & Plan Note (Signed)
A: BP not at goal P: Increase lisinopril from 20 mg to 40 mg daily RN BP check in 2-3 weeks Add thiazide diuretic HCTZ 12.5-25 mg daily of BP not at goal of < 140/90

## 2014-05-15 NOTE — Patient Instructions (Signed)
[name],  Thank you for coming in today. It was a pleasure meeting you. I look forward to being your primary doctor.   1. Hypertension: increase lisinopril to 40 mg daily.   2. Diabetes: Changed metformin to XR 1000 mg after supper and 1000 mg after breakfast.  Low carb diet.    3. Rehab: PT referral.   F/u in 2-3 weeks for RN BP check   F/u with me in 6 weeks for HTN and stroke/rehab f/u.   Dr. Adrian Blackwater

## 2014-05-16 ENCOUNTER — Telehealth: Payer: Self-pay | Admitting: Family Medicine

## 2014-05-16 ENCOUNTER — Other Ambulatory Visit: Payer: Self-pay | Admitting: Family Medicine

## 2014-05-16 NOTE — Telephone Encounter (Signed)
Pt has question for nurse regarding meter use. Please f/u with pt.

## 2014-05-16 NOTE — Telephone Encounter (Signed)
Left message, glucometer was e-scribe to [facility] pharmacy. If any question return call

## 2014-05-21 ENCOUNTER — Telehealth: Payer: Self-pay | Admitting: Family Medicine

## 2014-05-21 NOTE — Telephone Encounter (Signed)
Pt. Came into facility to request a letter from doctor to be able to go back to work, pt. Would like the letter to say he can only do light work due to his condition. Please f/u with pt.

## 2014-05-21 NOTE — Telephone Encounter (Signed)
Pt. request a letter from PCP to be able to go back to work, pt. Would like the letter to say he can only do light work due to his condition.

## 2014-05-22 ENCOUNTER — Ambulatory Visit: Payer: BC Managed Care – PPO

## 2014-05-23 ENCOUNTER — Telehealth: Payer: Self-pay | Admitting: Family Medicine

## 2014-05-23 NOTE — Telephone Encounter (Signed)
Pt. Came into the facility to check on the status of the letter he needs to go back to work with light work. Pt would like to speak to nurse, please f/u with pt.

## 2014-05-25 ENCOUNTER — Telehealth: Payer: Self-pay | Admitting: Family Medicine

## 2014-05-25 DIAGNOSIS — I10 Essential (primary) hypertension: Secondary | ICD-10-CM

## 2014-05-25 MED ORDER — LISINOPRIL 40 MG PO TABS: 20.0000 mg | ORAL_TABLET | Freq: Every day | ORAL | Status: DC

## 2014-05-25 MED ORDER — FENOFIBRATE 160 MG PO TABS: 160.0000 mg | ORAL_TABLET | Freq: Every day | ORAL | Status: DC

## 2014-05-25 NOTE — Assessment & Plan Note (Signed)
A: patient with dizzy spells Wednesday night not dizzy now.  P:  Decrease lisinopril from 40 mg to 20 mg daily.  F/u on Tuesday for Rn BP check F/u with me in one week 08/02/13

## 2014-05-25 NOTE — Telephone Encounter (Signed)
A: patient with dizzy spells Wednesday night not dizzy now.  P:  Decrease lisinopril from 40 mg to 20 mg daily.  F/u on Tuesday for Rn BP check F/u with me in one week 08/02/13

## 2014-05-28 ENCOUNTER — Ambulatory Visit: Payer: BC Managed Care – PPO

## 2014-05-29 ENCOUNTER — Ambulatory Visit: Payer: BC Managed Care – PPO

## 2014-05-29 ENCOUNTER — Ambulatory Visit: Payer: BC Managed Care – PPO | Attending: Internal Medicine

## 2014-05-29 ENCOUNTER — Ambulatory Visit: Payer: BC Managed Care – PPO | Attending: Family Medicine | Admitting: *Deleted

## 2014-05-29 VITALS — BP 150/84 | HR 84 | Temp 98.2°F | Resp 20

## 2014-05-29 DIAGNOSIS — I10 Essential (primary) hypertension: Secondary | ICD-10-CM | POA: Insufficient documentation

## 2014-05-29 DIAGNOSIS — Z87891 Personal history of nicotine dependence: Secondary | ICD-10-CM | POA: Insufficient documentation

## 2014-05-29 NOTE — Progress Notes (Signed)
Patient presents for BP check Med list reviewed; states taking lisinopril 40 mg 1/2 tab (20 mg total) bid States not taking fenofibrate due to cost States quit smoking 05/15/14 C/o tingling sensation legs and feet for 2 weeks C/o itching right side of neck for 3 weeks  Right side of neck is red where patient has rubbed it but no rash noted  BP 150/84 left arm manually with adult cuff  P 84 R  20 T  98.2oral SPO2 97 %  Patient advised to call for med refills at least 7 days before running out so as not to go without. Patient has f/u appt scheduled with PCP on 06/01/14

## 2014-05-30 ENCOUNTER — Emergency Department (HOSPITAL_COMMUNITY): Payer: Self-pay

## 2014-05-30 ENCOUNTER — Ambulatory Visit: Payer: BC Managed Care – PPO | Admitting: Physician Assistant

## 2014-05-30 ENCOUNTER — Emergency Department (HOSPITAL_COMMUNITY)
Admission: EM | Admit: 2014-05-30 | Discharge: 2014-05-30 | Disposition: A | Discharge status: Home / Self Care | Payer: Self-pay | Attending: Emergency Medicine | Admitting: Emergency Medicine

## 2014-05-30 ENCOUNTER — Encounter (HOSPITAL_COMMUNITY): Payer: Self-pay | Admitting: Adult Health

## 2014-05-30 DIAGNOSIS — R200 Anesthesia of skin: Secondary | ICD-10-CM | POA: Insufficient documentation

## 2014-05-30 DIAGNOSIS — Z79899 Other long term (current) drug therapy: Secondary | ICD-10-CM | POA: Insufficient documentation

## 2014-05-30 DIAGNOSIS — Z720 Tobacco use: Secondary | ICD-10-CM | POA: Insufficient documentation

## 2014-05-30 DIAGNOSIS — Z8673 Personal history of transient ischemic attack (TIA), and cerebral infarction without residual deficits: Secondary | ICD-10-CM | POA: Insufficient documentation

## 2014-05-30 DIAGNOSIS — R002 Palpitations: Secondary | ICD-10-CM | POA: Insufficient documentation

## 2014-05-30 DIAGNOSIS — E119 Type 2 diabetes mellitus without complications: Secondary | ICD-10-CM | POA: Insufficient documentation

## 2014-05-30 DIAGNOSIS — Z7902 Long term (current) use of antithrombotics/antiplatelets: Secondary | ICD-10-CM | POA: Insufficient documentation

## 2014-05-30 DIAGNOSIS — I10 Essential (primary) hypertension: Secondary | ICD-10-CM | POA: Insufficient documentation

## 2014-05-30 LAB — URINALYSIS, ROUTINE W REFLEX MICROSCOPIC
Bilirubin Urine: NEGATIVE
Glucose, UA: 250 mg/dL — AB
Hgb urine dipstick: NEGATIVE
Ketones, ur: NEGATIVE mg/dL
Leukocytes, UA: NEGATIVE
NITRITE: NEGATIVE
PH: 5 (ref 5.0–8.0)
Protein, ur: NEGATIVE mg/dL
SPECIFIC GRAVITY, URINE: 1.023 (ref 1.005–1.030)
Urobilinogen, UA: 0.2 mg/dL (ref 0.0–1.0)

## 2014-05-30 LAB — BASIC METABOLIC PANEL
Anion gap: 17 — ABNORMAL HIGH (ref 5–15)
BUN: 22 mg/dL (ref 6–23)
CO2: 20 meq/L (ref 19–32)
Calcium: 9.8 mg/dL (ref 8.4–10.5)
Chloride: 98 mEq/L (ref 96–112)
Creatinine, Ser: 1.58 mg/dL — ABNORMAL HIGH (ref 0.50–1.35)
GFR calc Af Amer: 52 mL/min — ABNORMAL LOW (ref >90)
GFR calc non Af Amer: 45 mL/min — ABNORMAL LOW (ref >90)
GLUCOSE: 216 mg/dL — AB (ref 70–99)
POTASSIUM: 4.1 meq/L (ref 3.7–5.3)
Sodium: 135 mEq/L — ABNORMAL LOW (ref 137–147)

## 2014-05-30 LAB — CBC
HCT: 39.9 % (ref 39.0–52.0)
Hemoglobin: 13.7 g/dL (ref 13.0–17.0)
MCH: 30.3 pg (ref 26.0–34.0)
MCHC: 34.3 g/dL (ref 30.0–36.0)
MCV: 88.3 fL (ref 78.0–100.0)
PLATELETS: 272 10*3/uL (ref 150–400)
RBC: 4.52 MIL/uL (ref 4.22–5.81)
RDW: 13.1 % (ref 11.5–15.5)
WBC: 5 10*3/uL (ref 4.0–10.5)

## 2014-05-30 LAB — CBG MONITORING, ED
GLUCOSE-CAPILLARY: 201 mg/dL — AB (ref 70–99)
GLUCOSE-CAPILLARY: 209 mg/dL — AB (ref 70–99)

## 2014-05-30 LAB — I-STAT TROPONIN, ED: Troponin i, poc: 0.01 ng/mL (ref 0.00–0.08)

## 2014-05-30 LAB — TROPONIN I: Troponin I: <0.3 ng/mL (ref <0.30)

## 2014-05-30 MED ORDER — SODIUM CHLORIDE 0.9 % IV BOLUS (SEPSIS)
1000.0000 mL | Freq: Once | INTRAVENOUS | Status: AC
  Administered 2014-05-30: 1000 mL via INTRAVENOUS

## 2014-05-30 NOTE — Discharge Instructions (Signed)

## 2014-05-30 NOTE — ED Provider Notes (Signed)
CSN: [id number]     Arrival date & time 05/30/14  1500 History   First MD Initiated Contact with Patient 05/30/14 1537     Chief Complaint  Patient presents with  . Chest Pain  . Numbness     (Consider location/radiation/quality/duration/timing/severity/associated sxs/prior Treatment) Patient is a 63 y.o. male presenting with chest pain.  Chest Pain Pain location:  Substernal area Pain quality comment:  Fluttering Pain radiates to:  Does not radiate Pain radiates to the back: no   Pain severity:  Moderate Onset quality:  Sudden Duration:  1 hour Timing:  Constant Progression:  Resolved Chronicity:  New Context comment:  Spontaneous Relieved by:  Nothing Worsened by:  Nothing tried Ineffective treatments:  None tried Associated symptoms: no abdominal pain   Associated symptoms comment:  Dizziness, increased L sided symptoms of tingling from baseline of prior CVA   Past Medical History  Diagnosis Date  . Atrial fibrillation   . Hypertension Dx 2008  . Diabetes mellitus without complication Dx 3888  . Abscess of anal and rectal regions 07/15/2007    Qualifier: Diagnosis of  By: Amil Amen MD, Benjamine Mola    . FISTULA, ANAL 01/27/2007    Qualifier: Diagnosis of  By: Amil Amen MD, Benjamine Mola    . Stroke    History reviewed. No pertinent past surgical history. Family History  Problem Relation Age of Onset  . Diabetes Mother   . Diabetes Father    History  Substance Use Topics  . Smoking status: Current Every Day Smoker    Types: Cigarettes  . Smokeless tobacco: Not on file     Comment: Quit 05/15/14  . Alcohol Use: No    Review of Systems  Cardiovascular: Positive for chest pain.  Gastrointestinal: Negative for abdominal pain.  All other systems reviewed and are negative.     Allergies  Review of patient's allergies indicates no known allergies.  Home Medications   Prior to Admission medications   Medication Sig Start Date End Date Taking? Authorizing Provider   apixaban (ELIQUIS) 5 MG TABS tablet Take 1 tablet (5 mg total) by mouth 2 (two) times daily. 05/03/14  Yes Albertine Patricia, MD  fenofibrate 160 MG tablet Take 1 tablet (160 mg total) by mouth daily. 05/25/14  Yes Josalyn C Funches, MD  lisinopril (PRINIVIL,ZESTRIL) 40 MG tablet Take 0.5 tablets (20 mg total) by mouth daily. 05/25/14  Yes Josalyn C Funches, MD  metFORMIN (GLUCOPHAGE XR) 500 MG 24 hr tablet Take 2 tablets (1,000 mg total) by mouth 2 (two) times daily after a meal. 05/15/14  Yes Josalyn C Funches, MD  GlucoCom Lancets MISC 100 Units by Does not apply route once. 05/15/14   Josalyn C Funches, MD  glucose blood test strip Use as instructed 05/15/14   Josalyn C Funches, MD  glucose monitoring kit (FREESTYLE) monitoring kit 1 each by Does not apply route as needed for other. 05/15/14   Josalyn C Funches, MD   BP 155/92 mmHg  Pulse 71  Temp(Src) 98.7 F (37.1 C) (Oral)  Resp 18  SpO2 98% Physical Exam  Constitutional: He is oriented to person, place, and time. He appears well-developed and well-nourished.  HENT:  Head: Normocephalic and atraumatic.  Eyes: Conjunctivae and EOM are normal.  Neck: Normal range of motion. Neck supple.  Cardiovascular: Normal rate, regular rhythm and normal heart sounds.   Pulmonary/Chest: Effort normal and breath sounds normal. No respiratory distress.  Abdominal: He exhibits no distension. There is no tenderness. There is no  rebound and no guarding.  Musculoskeletal: Normal range of motion.  Neurological: He is alert and oriented to person, place, and time. A cranial nerve deficit (L sided facial droop) and sensory deficit (L sided decrease) is present. He exhibits abnormal muscle tone (decreased on L).  Skin: Skin is warm and dry.  Vitals reviewed.   ED Course  Procedures (including critical care time) Labs Review Labs Reviewed  BASIC METABOLIC PANEL - Abnormal; Notable for the following:    Sodium 135 (*)    Glucose, Bld 216 (*)     Creatinine, Ser 1.58 (*)    GFR calc non Af Amer 45 (*)    GFR calc Af Amer 52 (*)    Anion gap 17 (*)    All other components within normal limits  URINALYSIS, ROUTINE W REFLEX MICROSCOPIC - Abnormal; Notable for the following:    Glucose, UA 250 (*)    All other components within normal limits  CBG MONITORING, ED - Abnormal; Notable for the following:    Glucose-Capillary 201 (*)    All other components within normal limits  CBG MONITORING, ED - Abnormal; Notable for the following:    Glucose-Capillary 209 (*)    All other components within normal limits  CBC  TROPONIN I  TROPONIN I  I-STAT TROPOININ, ED    Imaging Review Dg Chest 2 View  05/30/2014   CLINICAL DATA:  Chest pain, shortness of breath, dizziness.  EXAM: CHEST  2 VIEW  COMPARISON:  05/01/2014  FINDINGS: Scarring noted at the left lung base, stable. No acute airspace opacities or effusions. No acute bony abnormality.  IMPRESSION: No active cardiopulmonary disease.   Electronically Signed   By: Rolm Baptise M.D.   On: 05/30/2014 16:33   Ct Head Wo Contrast  05/30/2014   CLINICAL DATA:  Dizziness, left arm and foot numbness, headache.  EXAM: CT HEAD WITHOUT CONTRAST  TECHNIQUE: Contiguous axial images were obtained from the base of the skull through the vertex without intravenous contrast.  COMPARISON:  CT scan of May 01, 2014.  FINDINGS: Fluid is noted in the left mastoid air cells which is unchanged compared to prior exam. Bony calvarium is intact. Minimal diffuse cortical atrophy is noted. Mild chronic ischemic white matter disease is noted. Stable old lacunar infarctions are noted in the basal ganglia bilaterally. No mass effect or midline shift is noted. Ventricular size is within normal limits. There is no evidence of mass lesion, hemorrhage or acute infarction.  IMPRESSION: Minimal diffuse cortical atrophy. Mild chronic ischemic white matter disease. No acute intracranial abnormality seen.   Electronically Signed    By: Sabino Dick M.D.   On: 05/30/2014 16:44     EKG Interpretation   Date/Time:  Wednesday May 30 2014 15:20:15 EST Ventricular Rate:  98 PR Interval:  166 QRS Duration: 88 QT Interval:  354 QTC Calculation: 451 R Axis:   40 Text Interpretation:  Normal sinus rhythm Normal ECG SINCE LAST TRACING  HEART RATE HAS INCREASED Confirmed by Debby Freiberg [phone]) on 05/30/2014  3:39:40 PM      MDM   Final diagnoses:  Palpitations    63 y.o. 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  Physical exam as above on arrival.  He is essentially asymptomatic at time of exam.  No increase in baseline symptoms at time of my exam.  WU as above unremarkable.    He has a ho  PAFib, which is the likely etiology of his symptoms with unremarkable wu.  DC home in stable condition.  1. Palpitations         Debby Freiberg, MD 06/01/14 [phone]

## 2014-05-30 NOTE — ED Notes (Signed)
PT aware of need for urine sample. Pt given urinal.

## 2014-05-30 NOTE — ED Notes (Addendum)
At 14:30 pt began to feel a funny feeling in chest accompanied with sudden onset of dizziness, left arm and foot numbness, headache, blurred vision. symptoms have resolved, minus a slight headache. At the time pt went upstairs to check blood pressure and it was 200/140, took bp medication and it is down at this time. Pressure 146/86 here. Dr. Colin Rhein screening pt

## 2014-05-30 NOTE — ED Notes (Signed)
Pt placed in gown and in bed. Pt monitored by pulse ox, bp cuff, and 12-lead. 

## 2014-05-30 NOTE — ED Notes (Signed)
CBG = 209  RN informed of result.

## 2014-05-30 NOTE — ED Provider Notes (Signed)
MSE was initiated and I personally evaluated the patient and placed orders (if any) at  3:31 PM on May 30, 2014.  The patient appears stable so that the remainder of the MSE may be completed by another provider.  Briefly he is a 63 y.o. male with a recent CVA with residual L sided deficit who presents with fluttering in his heart for about 1 hour which has now resolved.  On my exam the pt has L sided weakness, however per his report this is baseline.  He is not currently in any chest pain.  He is alert and oriented x 3.    Debby Freiberg, MD 05/30/14 [phone]

## 2014-05-31 ENCOUNTER — Ambulatory Visit: Payer: MEDICAID

## 2014-06-01 ENCOUNTER — Ambulatory Visit: Payer: BC Managed Care – PPO | Attending: Family Medicine | Admitting: Family Medicine

## 2014-06-01 ENCOUNTER — Encounter: Payer: Self-pay | Admitting: Family Medicine

## 2014-06-01 VITALS — BP 146/94 | HR 92 | Temp 98.7°F | Resp 18 | Ht 73.0 in | Wt 293.0 lb

## 2014-06-01 DIAGNOSIS — R269 Unspecified abnormalities of gait and mobility: Secondary | ICD-10-CM | POA: Insufficient documentation

## 2014-06-01 DIAGNOSIS — E1149 Type 2 diabetes mellitus with other diabetic neurological complication: Secondary | ICD-10-CM

## 2014-06-01 DIAGNOSIS — F17200 Nicotine dependence, unspecified, uncomplicated: Secondary | ICD-10-CM | POA: Insufficient documentation

## 2014-06-01 DIAGNOSIS — Z87891 Personal history of nicotine dependence: Secondary | ICD-10-CM

## 2014-06-01 DIAGNOSIS — I10 Essential (primary) hypertension: Secondary | ICD-10-CM

## 2014-06-01 DIAGNOSIS — I6930 Unspecified sequelae of cerebral infarction: Secondary | ICD-10-CM | POA: Insufficient documentation

## 2014-06-01 DIAGNOSIS — M21372 Foot drop, left foot: Secondary | ICD-10-CM | POA: Insufficient documentation

## 2014-06-01 DIAGNOSIS — F418 Other specified anxiety disorders: Secondary | ICD-10-CM

## 2014-06-01 DIAGNOSIS — Z7901 Long term (current) use of anticoagulants: Secondary | ICD-10-CM | POA: Insufficient documentation

## 2014-06-01 DIAGNOSIS — I63311 Cerebral infarction due to thrombosis of right middle cerebral artery: Secondary | ICD-10-CM

## 2014-06-01 LAB — GLUCOSE, POCT (MANUAL RESULT ENTRY): POC GLUCOSE: 163 mg/dL — AB (ref 70–99)

## 2014-06-01 MED ORDER — LISINOPRIL 40 MG PO TABS: 40.0000 mg | ORAL_TABLET | Freq: Every day | ORAL | Status: DC

## 2014-06-01 MED ORDER — VENLAFAXINE HCL ER 37.5 MG PO CP24: 37.5000 mg | ORAL_CAPSULE | Freq: Every day | ORAL | Status: DC

## 2014-06-01 MED ORDER — LISINOPRIL 40 MG PO TABS: 20.0000 mg | ORAL_TABLET | Freq: Every day | ORAL | Status: DC

## 2014-06-01 NOTE — Progress Notes (Signed)
   Subjective:    Patient ID: [name], male    DOB: 04-19-1951, 63 y.o.   MRN: [id number] CC: f/u CVA, medical clearance to return to work  HPI 63 yo M presents for f/u visit:   1. S/p CVA: patient needs to return to work for financial reasons. Has not been able to start PT due to cost. Feels like he can return to work but with restrictions on lifting and transporting his client. 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   2. Stress: increased stress since CVA with decreased patience, palpitations, irritability. Patient has worries about his financial situation. He does believe and know God. He is prayerful and hopeful.   Soc Hx: current smoker  Review of Systems As per HPI     Objective:   Physical Exam BP 141/88 mmHg  Pulse 92  Temp(Src) 98.7 F (37.1 C) (Oral)  Resp 18  Ht 6\' 1"  (1.854 m)  Wt 293 lb (132.904 kg)  BMI 38.67 kg/m2  SpO2 97%  BP Readings from Last 3 Encounters:  06/01/14 146/94  05/30/14 155/92  05/29/14 150/84  General appearance: alert, cooperative and no distress Neurologic: Mental status: Alert, oriented, thought content appropriate Cranial nerves: normal  Gait: Abnormal, favors R side, L foot drop mild. Walking with a cane.        Assessment & Plan:

## 2014-06-01 NOTE — Assessment & Plan Note (Signed)
A: situational anxiety P:  I will pray for your and your wife. Do start effexor 37.5 mg in the morning with food, increase to 75 mg in the AM (2 pills) after one week.  Watch out for stomach upset and dizziness when starting this medication.

## 2014-06-01 NOTE — Assessment & Plan Note (Signed)
Increase lisinopril to 40 mg daily for tigher BP control, goal < 140/90 at all times.

## 2014-06-01 NOTE — Progress Notes (Signed)
F/U Medical Clearance

## 2014-06-01 NOTE — Patient Instructions (Addendum)
[name],  Thank you for coming in today.  1. Anxiety: I will pray for your and your wife. Do start effexor 37.5 mg in the morning with food, increase to 75 mg in the AM (2 pills) after one week.  Watch out for stomach upset and dizziness when starting this medication.   2. Return to work with restrictions as indicated in letter.  3. Increase lisinopril to 40 mg daily for tigher BP control, goal < 140/90 at all times.   See me in 3-4 weeks.  Dr. Adrian Blackwater

## 2014-06-01 NOTE — Assessment & Plan Note (Signed)
A: patient with recent CVA affecting his L side. Has not started PT. Eager to return to work. P: Return to work with restrictions as needed in letter 15 # weight limit on lifting, no community excursions, working full 5 hr shifts as tolerated.

## 2014-06-05 ENCOUNTER — Ambulatory Visit: Payer: BC Managed Care – PPO

## 2014-06-11 ENCOUNTER — Other Ambulatory Visit: Payer: Self-pay | Admitting: Emergency Medicine

## 2014-06-11 ENCOUNTER — Telehealth: Payer: Self-pay | Admitting: *Deleted

## 2014-06-11 MED ORDER — APIXABAN 5 MG PO TABS: 5.0000 mg | ORAL_TABLET | Freq: Two times a day (BID) | ORAL | Status: DC

## 2014-06-11 NOTE — Telephone Encounter (Signed)
Pt requested refill Rx Eliquis

## 2014-06-18 ENCOUNTER — Other Ambulatory Visit: Payer: Self-pay | Admitting: *Deleted

## 2014-06-18 MED ORDER — APIXABAN 5 MG PO TABS: 5.0000 mg | ORAL_TABLET | Freq: Two times a day (BID) | ORAL | Status: DC

## 2014-06-25 ENCOUNTER — Ambulatory Visit: Payer: BC Managed Care – PPO | Admitting: Family Medicine

## 2014-06-28 ENCOUNTER — Ambulatory Visit: Payer: Self-pay | Admitting: Family Medicine

## 2014-07-11 ENCOUNTER — Ambulatory Visit: Payer: Self-pay | Admitting: Neurology

## 2014-07-20 ENCOUNTER — Telehealth: Payer: Self-pay | Admitting: Family Medicine

## 2014-07-20 NOTE — Telephone Encounter (Signed)
Pt call complaining of swelling lip, unsure of source. Not taking any new medication, food or new product  Taking Benadryl and helped  Advice to come to walking clinic today, go to UC if Sx worsen

## 2014-07-20 NOTE — Telephone Encounter (Signed)
Patient called stating that his bottom lip has been swollen since yesterday and he has been having a low grade fever. Patient also states that he has been taking OTC Benadryl which has been helping but the swelling has not subsided. Please f/u with pt.

## 2014-08-09 ENCOUNTER — Ambulatory Visit: Payer: Self-pay | Attending: Family Medicine | Admitting: Family Medicine

## 2014-08-09 ENCOUNTER — Encounter: Payer: Self-pay | Admitting: Family Medicine

## 2014-08-09 VITALS — BP 138/91 | HR 68 | Temp 97.5°F | Resp 16 | Ht 73.0 in | Wt 220.0 lb

## 2014-08-09 DIAGNOSIS — I63311 Cerebral infarction due to thrombosis of right middle cerebral artery: Secondary | ICD-10-CM | POA: Insufficient documentation

## 2014-08-09 DIAGNOSIS — E119 Type 2 diabetes mellitus without complications: Secondary | ICD-10-CM | POA: Insufficient documentation

## 2014-08-09 DIAGNOSIS — K219 Gastro-esophageal reflux disease without esophagitis: Secondary | ICD-10-CM

## 2014-08-09 DIAGNOSIS — Z87891 Personal history of nicotine dependence: Secondary | ICD-10-CM | POA: Insufficient documentation

## 2014-08-09 DIAGNOSIS — I10 Essential (primary) hypertension: Secondary | ICD-10-CM | POA: Insufficient documentation

## 2014-08-09 LAB — GLUCOSE, POCT (MANUAL RESULT ENTRY): POC GLUCOSE: 137 mg/dL — AB (ref 70–99)

## 2014-08-09 LAB — POCT GLYCOSYLATED HEMOGLOBIN (HGB A1C): Hemoglobin A1C: 7.1

## 2014-08-09 MED ORDER — FENOFIBRATE 160 MG PO TABS: 160.0000 mg | ORAL_TABLET | Freq: Every day | ORAL | Status: DC

## 2014-08-09 MED ORDER — RANITIDINE HCL 150 MG PO TABS: 150.0000 mg | ORAL_TABLET | Freq: Two times a day (BID) | ORAL | Status: DC

## 2014-08-09 NOTE — Progress Notes (Signed)
Patient stopped the Effexor 3 days ago and says he is feeling poorly Patient got approved for Eliquis through patient assistance program Patient refuses flu shot

## 2014-08-09 NOTE — Patient Instructions (Signed)
[name],  Thank you for coming in today.  1. BP goal is < 140/90 Very near goal Continue lisinopril 40 mg daily  2. Diabetes: A1c goal is < 7 Very near goal! Continue metformin   3. GERD: zantac  Corcidin HBP for high blood pressure Walk with a cane.   F/u in 3 months for repeat A1c  Dr. Adrian Blackwater

## 2014-08-12 NOTE — Assessment & Plan Note (Signed)
Diabetes: A1c goal is < 7 Very near goal! Continue metformin

## 2014-08-12 NOTE — Progress Notes (Signed)
   Subjective:    Patient ID: [name], male    DOB: 12/12/1950, 64 y.o.   MRN: [id number] CC: f/u CVA HPI 64 yo M recent CVA:  1. Recent CVA still with L sided weakness. Unable to return to work in same capacity. Unable to afford PT/neuro rehab. Walks with cane. No falls.   2. DM2: compliant with meds. No low sugars. No polyuria or polydipsia.  3. HTN: compliant with meds. No CP or SOB.   Soc Hx: former smoker, quit in 05/2014 following CVA  Review of Systems As per HPI  Acid reflux     Objective:   Physical Exam BP 138/91 mmHg  Pulse 68  Temp(Src) 97.5 F (36.4 C)  Resp 16  Ht 6\' 1"  (1.854 m)  Wt 220 lb (99.791 kg)  BMI 29.03 kg/m2  SpO2 97% General appearance: alert, cooperative and no distress Lungs: clear to auscultation bilaterally Heart: regular rate and rhythm, S1, S2 normal, no murmur, click, rub or gallop Extremities: extremities normal, atraumatic, no cyanosis or edema Neurologic: gait is slowed and a bit unsteady w/o assistance        Assessment & Plan:

## 2014-08-12 NOTE — Assessment & Plan Note (Signed)
GERD: zantac

## 2014-08-12 NOTE — Assessment & Plan Note (Signed)
1. BP goal is < 140/90 Very near goal Continue lisinopril 40 mg daily

## 2014-09-03 ENCOUNTER — Telehealth: Payer: Self-pay | Admitting: Emergency Medicine

## 2014-09-03 NOTE — Telephone Encounter (Signed)
Left voice message, letter ready to be pick up

## 2014-09-03 NOTE — Telephone Encounter (Signed)
Patient called to check on his request for a "Return to full duty" letter for work. Please f/u with pt.

## 2014-09-03 NOTE — Telephone Encounter (Signed)
Patient called this morning and requested a "Return to full duty" letter for work. I told him we would call him back at [phone] when it was ready.

## 2014-09-03 NOTE — Telephone Encounter (Signed)
Letter written please call patient to pick up.

## 2014-10-01 ENCOUNTER — Emergency Department (HOSPITAL_COMMUNITY): Payer: Self-pay

## 2014-10-01 ENCOUNTER — Encounter (HOSPITAL_COMMUNITY): Payer: Self-pay | Admitting: *Deleted

## 2014-10-01 ENCOUNTER — Emergency Department (HOSPITAL_COMMUNITY)
Admission: EM | Admit: 2014-10-01 | Discharge: 2014-10-01 | Disposition: A | Discharge status: Home / Self Care | Payer: Self-pay | Attending: Emergency Medicine | Admitting: Emergency Medicine

## 2014-10-01 DIAGNOSIS — I4891 Unspecified atrial fibrillation: Secondary | ICD-10-CM | POA: Insufficient documentation

## 2014-10-01 DIAGNOSIS — Z87891 Personal history of nicotine dependence: Secondary | ICD-10-CM | POA: Insufficient documentation

## 2014-10-01 DIAGNOSIS — R1031 Right lower quadrant pain: Secondary | ICD-10-CM

## 2014-10-01 DIAGNOSIS — S76219A Strain of adductor muscle, fascia and tendon of unspecified thigh, initial encounter: Secondary | ICD-10-CM

## 2014-10-01 DIAGNOSIS — Y9289 Other specified places as the place of occurrence of the external cause: Secondary | ICD-10-CM | POA: Insufficient documentation

## 2014-10-01 DIAGNOSIS — W1839XA Other fall on same level, initial encounter: Secondary | ICD-10-CM | POA: Insufficient documentation

## 2014-10-01 DIAGNOSIS — Y998 Other external cause status: Secondary | ICD-10-CM | POA: Insufficient documentation

## 2014-10-01 DIAGNOSIS — Z79899 Other long term (current) drug therapy: Secondary | ICD-10-CM | POA: Insufficient documentation

## 2014-10-01 DIAGNOSIS — Z8673 Personal history of transient ischemic attack (TIA), and cerebral infarction without residual deficits: Secondary | ICD-10-CM | POA: Insufficient documentation

## 2014-10-01 DIAGNOSIS — Y9389 Activity, other specified: Secondary | ICD-10-CM | POA: Insufficient documentation

## 2014-10-01 DIAGNOSIS — E119 Type 2 diabetes mellitus without complications: Secondary | ICD-10-CM | POA: Insufficient documentation

## 2014-10-01 DIAGNOSIS — I10 Essential (primary) hypertension: Secondary | ICD-10-CM | POA: Insufficient documentation

## 2014-10-01 DIAGNOSIS — Z8719 Personal history of other diseases of the digestive system: Secondary | ICD-10-CM | POA: Insufficient documentation

## 2014-10-01 DIAGNOSIS — S39011A Strain of muscle, fascia and tendon of abdomen, initial encounter: Secondary | ICD-10-CM | POA: Insufficient documentation

## 2014-10-01 MED ORDER — OXYCODONE-ACETAMINOPHEN 5-325 MG PO TABS
1.0000 | ORAL_TABLET | Freq: Once | ORAL | Status: AC
  Administered 2014-10-01: 1 via ORAL
  Filled 2014-10-01: qty 1

## 2014-10-01 MED ORDER — OXYCODONE-ACETAMINOPHEN 5-325 MG PO TABS: 1.0000 | ORAL_TABLET | Freq: Four times a day (QID) | ORAL | Status: DC | PRN

## 2014-10-01 NOTE — ED Provider Notes (Signed)
CSN: [id number]     Arrival date & time 10/01/14  7494 History   First MD Initiated Contact with Patient 10/01/14 [phone]     Chief Complaint  Patient presents with  . Groin Pain     Patient is a 64 y.o. male presenting with groin pain. The history is provided by the patient. No language interpreter was used.  Groin Pain   Mr. Heckler presents for evaluation of right groin pain. He began to develop some pain in the right groin when he helped his brother-in-law up 2 days ago. Initially the pain was mild and intermittent in nature. It is now sharp and located in the right groin. It is worse with standing and range of motion. It became worse today after he had a fall. He did not fall directly on the hip and he did not hit his head or passed out. He is a history of diabetes, atrial fibrillation, CVA with chronic left-sided weakness. He takes Eliquis.  Symptoms are moderate, constant, worsening.  Past Medical History  Diagnosis Date  . Atrial fibrillation   . Hypertension Dx 2008  . Diabetes mellitus without complication Dx 5916  . Abscess of anal and rectal regions 07/15/2007    Qualifier: Diagnosis of  By: Amil Amen MD, Benjamine Mola    . FISTULA, ANAL 01/27/2007    Qualifier: Diagnosis of  By: Amil Amen MD, Benjamine Mola    . Stroke    History reviewed. No pertinent past surgical history. Family History  Problem Relation Age of Onset  . Diabetes Mother   . Diabetes Father    History  Substance Use Topics  . Smoking status: Former Smoker    Types: Cigarettes    Quit date: 05/25/2014  . Smokeless tobacco: Not on file     Comment: Quit 05/15/14  . Alcohol Use: No    Review of Systems  All other systems reviewed and are negative.     Allergies  Review of patient's allergies indicates no known allergies.  Home Medications   Prior to Admission medications   Medication Sig Start Date End Date Taking? Authorizing Provider  apixaban (ELIQUIS) 5 MG TABS tablet Take 1 tablet (5 mg total) by  mouth 2 (two) times daily. 06/18/14   Josalyn Funches, MD  fenofibrate 160 MG tablet Take 1 tablet (160 mg total) by mouth daily. 08/09/14   Boykin Nearing, MD  GlucoCom Lancets MISC 100 Units by Does not apply route once. 05/15/14   Josalyn Funches, MD  glucose blood test strip Use as instructed 05/15/14   Boykin Nearing, MD  glucose monitoring kit (FREESTYLE) monitoring kit 1 each by Does not apply route as needed for other. 05/15/14   Josalyn Funches, MD  lisinopril (PRINIVIL,ZESTRIL) 40 MG tablet Take 1 tablet (40 mg total) by mouth daily. 06/01/14   Boykin Nearing, MD  metFORMIN (GLUCOPHAGE XR) 500 MG 24 hr tablet Take 2 tablets (1,000 mg total) by mouth 2 (two) times daily after a meal. 05/15/14   Josalyn Funches, MD  ranitidine (ZANTAC) 150 MG tablet Take 1 tablet (150 mg total) by mouth 2 (two) times daily. 08/09/14   Josalyn Funches, MD   BP 129/66 mmHg  Pulse 58  Temp(Src) 97.6 F (36.4 C) (Oral)  Resp 16  Ht 6' 1"  (1.854 m)  Wt 215 lb (97.523 kg)  BMI 28.37 kg/m2  SpO2 99% Physical Exam  Constitutional: He is oriented to person, place, and time. He appears well-developed and well-nourished.  HENT:  Head: Normocephalic and atraumatic.  Cardiovascular: Normal rate and regular rhythm.   No murmur heard. Pulmonary/Chest: Effort normal and breath sounds normal. No respiratory distress.  Abdominal: Soft. There is no tenderness. There is no rebound and no guarding.  Genitourinary: Penis normal.  No hernia present. No testicular or scrotal tenderness.  Musculoskeletal: He exhibits no edema.  There is tenderness to palpation along the inguinal crease. No palpable masses. Pain is reproducible with raising the right lower extremity. 2+ femoral pulses bilaterally. No lower extremity edema. No rash.  Neurological: He is alert and oriented to person, place, and time.  Skin: Skin is warm and dry.  Psychiatric: He has a normal mood and affect. His behavior is normal.  Nursing note and  vitals reviewed.   ED Course  Procedures (including critical care time) Labs Review Labs Reviewed - No data to display  Imaging Review Dg Hip Unilat With Pelvis 2-3 Views Right  10/01/2014   CLINICAL DATA:  Lifting injury 09/29/2014. Right groin pain. Initial encounter.  EXAM: RIGHT HIP (WITH PELVIS) 2-3 VIEWS  COMPARISON:  None.  FINDINGS: There is no acute bony or joint abnormality. Mild to moderate bilateral hip degenerative change appears worse on the right. The sacroiliac joints and symphysis pubis appear normal. No worrisome bony lesion is identified.  IMPRESSION: No acute abnormality.  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   Electronically Signed   By: Inge Rise M.D.   On: 10/01/2014 10:50     EKG Interpretation None      MDM   Final diagnoses:  Groin pain, right  Groin strain, initial encounter    Patient here for evaluation of right inguinal pain. There is no current evidence of infection, hernia, mass on exam. Examination is not consistent with acute fracture, ischemic limb, DVT. Patient is feeling improved on recheck after pain medications. Discussed with patient home care for  groin strain as well as PCP follow-up and return precautions. Discussed return for evidence of developing for hernia or infection.    Quintella Reichert, MD 10/01/14 [phone]

## 2014-10-01 NOTE — Discharge Instructions (Signed)
Groin Strain °A groin strain (also called a groin pull) is an injury to the muscles or tendon on the upper inner part of the thigh. These muscles are called the adductor muscles or groin muscles. They are responsible for moving the leg across the body. A muscle strain occurs when a muscle is overstretched and some muscle fibers are torn. A groin strain can range from mild to severe depending on how many muscle fibers are affected and whether the muscle fibers are partially or completely torn.  °Groin strains usually occur during exercise or participation in sports. The injury often happens when a sudden, violent force is placed on a muscle, stretching the muscle too far. A strain is more likely to occur when your muscles are not warmed up or if you are not properly conditioned. Depending on the severity of the groin strain, recovery time may vary from a few weeks to several weeks. Severe injuries often require 4-6 weeks for recovery. In these cases, complete healing can take 4-5 months.  °CAUSES  °· Stretching the groin muscles too far or too suddenly, often during side-to-side motion with an abrupt change in direction. °· Putting repeated stress on the groin muscles over a long period of time. °· Performing vigorous activity without properly stretching the groin muscles beforehand. °SYMPTOMS  °· Pain and tenderness in the groin area. This begins as sharp pain and persists as a dull ache. °· Popping or snapping feeling when the injury occurs (for severe strains). °· Swelling or bruising. °· Muscle spasms. °· Weakness in the leg. °· Stiffness in the groin area with decreased ability to move the affected muscles. °DIAGNOSIS  °Your caregiver will perform a physical exam to diagnose a groin strain. You will be asked about your symptoms and how the injury occurred. X-rays are sometimes needed to rule out a broken bone or cartilage problems. Your caregiver may order a CT scan or MRI if a complete muscle tear is  suspected. °TREATMENT  °A groin strain will often heal on its own. Your caregiver may prescribe medicines to help manage pain and swelling (anti-inflammatory medicine). You may be told to use crutches for the first few days to minimize your pain. °HOME CARE INSTRUCTIONS  °· Rest. Do not use the strained muscle if it causes pain. °· Put ice on the injured area. °¨ Put ice in a plastic bag. °¨ Place a towel between your skin and the bag. °¨ Leave the ice on for 15-20 minutes, every 2-3 hours. Do this for the first 2 days after the injury.  °· Only take over-the-counter or prescription medicines as directed by your caregiver. °· Wrap the injured area with an elastic bandage as directed by your caregiver. °· Keep the injured leg raised (elevated). °· Walk, stretch, and perform range-of-motion exercises to improve blood flow to the injured area. Only perform these activities if you can do so without any pain. °To prevent muscle strains: °· Warm up before exercise. °· Develop proper conditioning and strength in the groin muscles. °SEEK IMMEDIATE MEDICAL CARE IF:  °· You have increased pain or swelling in the affected area.   °· Your symptoms are not improving or are getting worse. °MAKE SURE YOU:  °· Understand these instructions. °· Will watch your condition. °· Will get help right away if you are not doing well or get worse. °Document Released: 02/04/2004 Document Revised: 05/25/2012 Document Reviewed: 02/10/2012 °[employer]® Patient Information ©2015 [employer]. This information is not intended to replace advice given to you   by your health care provider. Make sure you discuss any questions you have with your health care provider. ° °

## 2014-10-01 NOTE — ED Notes (Signed)
Patient transported to X-ray 

## 2014-10-01 NOTE — ED Notes (Signed)
Pt c/o right groin pain x 2 days.  Pt's brother in law fell and he was trying to help him up when the pain began.  No deformity noted.  Pt a x 4 , NAD.  Pt also reports falling this AM due to pain.  No injury per pt.

## 2014-10-01 NOTE — ED Notes (Signed)
MD at bedside. 

## 2014-10-11 ENCOUNTER — Ambulatory Visit: Payer: Self-pay | Admitting: Family Medicine

## 2014-10-16 ENCOUNTER — Encounter: Payer: Self-pay | Admitting: Family Medicine

## 2014-10-16 ENCOUNTER — Ambulatory Visit: Payer: Self-pay | Attending: Family Medicine | Admitting: Family Medicine

## 2014-10-16 VITALS — BP 111/78 | HR 80 | Temp 97.0°F | Resp 18 | Ht 73.0 in | Wt 214.0 lb

## 2014-10-16 DIAGNOSIS — I69354 Hemiplegia and hemiparesis following cerebral infarction affecting left non-dominant side: Secondary | ICD-10-CM | POA: Insufficient documentation

## 2014-10-16 DIAGNOSIS — R300 Dysuria: Secondary | ICD-10-CM | POA: Insufficient documentation

## 2014-10-16 DIAGNOSIS — E1149 Type 2 diabetes mellitus with other diabetic neurological complication: Secondary | ICD-10-CM

## 2014-10-16 DIAGNOSIS — I69398 Other sequelae of cerebral infarction: Secondary | ICD-10-CM | POA: Insufficient documentation

## 2014-10-16 DIAGNOSIS — R1031 Right lower quadrant pain: Secondary | ICD-10-CM | POA: Insufficient documentation

## 2014-10-16 DIAGNOSIS — R209 Unspecified disturbances of skin sensation: Secondary | ICD-10-CM | POA: Insufficient documentation

## 2014-10-16 DIAGNOSIS — Z87891 Personal history of nicotine dependence: Secondary | ICD-10-CM | POA: Insufficient documentation

## 2014-10-16 DIAGNOSIS — E119 Type 2 diabetes mellitus without complications: Secondary | ICD-10-CM | POA: Insufficient documentation

## 2014-10-16 DIAGNOSIS — I63311 Cerebral infarction due to thrombosis of right middle cerebral artery: Secondary | ICD-10-CM

## 2014-10-16 DIAGNOSIS — R1030 Lower abdominal pain, unspecified: Secondary | ICD-10-CM

## 2014-10-16 LAB — POCT URINALYSIS DIPSTICK
Blood, UA: NEGATIVE
GLUCOSE UA: 100
KETONES UA: NEGATIVE
Leukocytes, UA: NEGATIVE
Nitrite, UA: NEGATIVE
PH UA: 5.5
Protein, UA: 30
Urobilinogen, UA: 0.2

## 2014-10-16 LAB — GLUCOSE, POCT (MANUAL RESULT ENTRY): POC GLUCOSE: 118 mg/dL — AB (ref 70–99)

## 2014-10-16 MED ORDER — ACETAMINOPHEN-CODEINE #3 300-30 MG PO TABS: 1.0000 | ORAL_TABLET | Freq: Three times a day (TID) | ORAL | Status: DC | PRN

## 2014-10-16 NOTE — Assessment & Plan Note (Signed)
A; still with L sided weakness following CVA in 04/2014 P: PT referral for rehab Patient to apply for orange card  Continue BP control CBG control  LDL was < 70 at time of stroke, patient not on statin, will get lipids at f/u visit

## 2014-10-16 NOTE — Progress Notes (Signed)
   Subjective:    Patient ID: [name], male    DOB: [birth year], 64 y.o.   MRN: [id number] CC: ED f/u  groin pain, DM2  HPI  1. R groin pain: started 09/28/13 while moving furniture. Pain is improving. Some intermittent dysuria. No bulging or swelling. Taking 1/4 of percocet prn.  2. DM2: sugars well controlled. Low carb diet. Compliant with regimen.  3. Hx of CVA: still with L sided weakness. Occasional dizziness. Numbness in both feet L>R. No new symptoms of weakness, numbness, droop. Would still like neuro rehab, did not get it before due to lack of insurance.   Soc: former smoker. quit  Review of Systems     Objective:   Physical Exam BP 111/78 mmHg  Pulse 80  Temp(Src) 97 F (36.1 C) (Oral)  Resp 18  Ht 6\' 1"  (1.854 m)  Wt 214 lb (97.07 kg)  BMI 28.24 kg/m2  SpO2 96% General appearance: alert, cooperative and no distress R groin: no inguinal swelling Testicular exam: patient refused Rectal exam: patient refused  Extremities: extremities normal, atraumatic, no cyanosis or edema  Lab Results  Component Value Date   HGBA1C 7.1 08/09/2014   CBG 118     Assessment & Plan:

## 2014-10-16 NOTE — Progress Notes (Signed)
HFU pull groin muscle  Still with pain, worsen with going up the stairs

## 2014-10-16 NOTE — Assessment & Plan Note (Signed)
A; well controlled Med: compliant P: continue current regimen F/u in 6 weeks for A1c

## 2014-10-16 NOTE — Patient Instructions (Addendum)
Mr. Hartshorn,  Thank you for coming in today.  Please apply for the orange card and make in appt with on site insurance coordinator to help navigate insurance options  1. Groin pain:  Tylenol #3 for pain  Ice  Gentle stretching Referral to PT  2. Diabetes: well controlled  3. HTN: BP well controlled  F/u in 6 weeks for diabetes f/u A1c due and prostate exam   Dr. Adrian Blackwater

## 2014-10-16 NOTE — Assessment & Plan Note (Signed)
A; R groin strain following heavy lifting. No bulging or abnormal urine studies to suggest hernia or infectious/inflammatory process P: Rest Ice Tylenol #3 prn pain Gentle stretching

## 2014-10-17 ENCOUNTER — Telehealth: Payer: Self-pay | Admitting: *Deleted

## 2014-10-17 LAB — MICROALBUMIN / CREATININE URINE RATIO
Creatinine, Urine: 565.1 mg/dL
MICROALB/CREAT RATIO: 3.5 mg/g (ref 0.0–30.0)
Microalb, Ur: 2 mg/dL (ref <2.0)

## 2014-10-17 NOTE — Telephone Encounter (Signed)
Gave patient results

## 2014-10-17 NOTE — Telephone Encounter (Signed)
-----   Message from Boykin Nearing, MD sent at 10/17/2014  8:34 AM EDT ----- Normal urine microalbumin, no evidence of diabetic kidney disease

## 2014-10-19 ENCOUNTER — Telehealth: Payer: Self-pay | Admitting: *Deleted

## 2014-10-19 NOTE — Telephone Encounter (Signed)
error 

## 2014-11-29 ENCOUNTER — Ambulatory Visit: Payer: Self-pay | Admitting: Family Medicine

## 2014-11-30 ENCOUNTER — Ambulatory Visit: Payer: Self-pay

## 2014-12-17 ENCOUNTER — Ambulatory Visit: Payer: Self-pay

## 2015-01-14 ENCOUNTER — Ambulatory Visit: Payer: Self-pay | Admitting: Family Medicine

## 2015-01-21 ENCOUNTER — Encounter: Payer: Self-pay | Admitting: Family Medicine

## 2015-01-21 ENCOUNTER — Ambulatory Visit: Payer: Self-pay | Attending: Family Medicine | Admitting: Family Medicine

## 2015-01-21 VITALS — BP 156/93 | HR 86 | Temp 98.0°F | Resp 16 | Wt 211.0 lb

## 2015-01-21 DIAGNOSIS — Z87891 Personal history of nicotine dependence: Secondary | ICD-10-CM | POA: Insufficient documentation

## 2015-01-21 DIAGNOSIS — I10 Essential (primary) hypertension: Secondary | ICD-10-CM | POA: Insufficient documentation

## 2015-01-21 DIAGNOSIS — E119 Type 2 diabetes mellitus without complications: Secondary | ICD-10-CM | POA: Insufficient documentation

## 2015-01-21 LAB — POCT GLYCOSYLATED HEMOGLOBIN (HGB A1C): HEMOGLOBIN A1C: 6.9

## 2015-01-21 LAB — GLUCOSE, POCT (MANUAL RESULT ENTRY): POC Glucose: 195 mg/dl — AB (ref 70–99)

## 2015-01-21 MED ORDER — LISINOPRIL 40 MG PO TABS: 40.0000 mg | ORAL_TABLET | Freq: Every day | ORAL | Status: DC

## 2015-01-21 NOTE — Assessment & Plan Note (Signed)
  1. Diabetes: Well controlled Continue your healthy diet No need for metformin  Diabetes blood sugar goals  Fasting (in AM before breakfast, 8 hrs of no eating or drinking (except water or unsweetened coffee or tea): 90-110 2 hrs after meals: < 160,   No low sugars: nothing < 70

## 2015-01-21 NOTE — Patient Instructions (Signed)
[name],  Thank you for coming in today  1. Diabetes: Well controlled Continue your healthy diet No need for metformin  Diabetes blood sugar goals  Fasting (in AM before breakfast, 8 hrs of no eating or drinking (except water or unsweetened coffee or tea): 90-110 2 hrs after meals: < 160,   No low sugars: nothing < 70    2. HTN:  Your blood pressure is elevated today Weight is stable Continue lisinopril 40 mg once daily If BP > 140/90 at follow up will add HCTZ 12.5 mg daily in the morning  F/u in 3-4 weeks with RN for BP check F/u with me in 3 months   Dr. Adrian Blackwater

## 2015-01-21 NOTE — Progress Notes (Signed)
   Subjective:    Patient ID: [name], male    DOB: 21-Aug-1950, 64 y.o.   MRN: [id number] CC: f/u HTN, DM2  HPI 64 yo M presents for f/u appt:  1. CHRONIC DIABETES  Disease Monitoring  Blood Sugar Ranges: < 160s  Polyuria: no   Visual problems: no   Medication Compliance: no, not taking metformin  Medication Side Effects  Hypoglycemia: no  Admits to sharp pain in toes and crawling sensation over feet.   2. CHRONIC HYPERTENSION  Disease Monitoring  Blood pressure range: not checking   Chest pain: no   Dyspnea: no   Claudication: no   Medication compliance: yes  Medication Side Effects  Lightheadedness: no   Edema: no    History  Substance Use Topics  . Smoking status: Former Smoker    Types: Cigarettes    Quit date: 05/25/2014  . Smokeless tobacco: Not on file     Comment: Quit 05/15/14  . Alcohol Use: No   Review of Systems  Constitutional: Negative for fever, chills, fatigue and unexpected weight change.  Eyes: Negative for visual disturbance.  Respiratory: Negative for cough and shortness of breath.   Cardiovascular: Negative for chest pain, palpitations and leg swelling.  Gastrointestinal: Negative for nausea, vomiting, abdominal pain, diarrhea, constipation and blood in stool.  Musculoskeletal: Negative for myalgias, back pain, arthralgias, gait problem and neck pain.  Skin: Negative for rash.      Objective:   Physical Exam BP 156/93 mmHg  Pulse 86  Temp(Src) 98 F (36.7 C)  Resp 16  Wt 211 lb (95.709 kg)  SpO2 100%  Wt Readings from Last 3 Encounters:  01/21/15 211 lb (95.709 kg)  10/16/14 214 lb (97.07 kg)  10/01/14 215 lb (97.523 kg)    BP Readings from Last 3 Encounters:  01/21/15 156/93  10/16/14 111/78  10/01/14 138/90  General appearance: alert, cooperative and no distress Lungs: clear to auscultation bilaterally Heart: regular rate and rhythm, S1, S2 normal, no murmur, click, rub or gallop Extremities: extremities normal,  atraumatic, no cyanosis or edema  CBG 195  Lab Results  Component Value Date   HGBA1C 6.90 01/21/2015      Assessment & Plan:

## 2015-01-21 NOTE — Assessment & Plan Note (Signed)
HTN:  Your blood pressure is elevated today Weight is stable Continue lisinopril 40 mg once daily If BP > 140/90 at follow up will add HCTZ 12.5 mg daily in the morning

## 2015-01-21 NOTE — Progress Notes (Signed)
Patient here for follow up on his pulled groin muscle Patient states that has gotten better  Patient also states he has been stressed out recently Him and his wife took in his brother when he was released from prison And not his brother has not been home for the past month Concerned he is back to doing what landed him in prison the first time

## 2015-03-06 ENCOUNTER — Telehealth: Payer: Self-pay | Admitting: Family Medicine

## 2015-03-13 ENCOUNTER — Other Ambulatory Visit: Payer: Self-pay | Admitting: *Deleted

## 2015-03-13 DIAGNOSIS — E1165 Type 2 diabetes mellitus with hyperglycemia: Secondary | ICD-10-CM

## 2015-03-13 MED ORDER — GLUCOCOM LANCETS 28G MISC: 100.0000 [IU] | Freq: Once | Status: DC

## 2015-06-03 ENCOUNTER — Telehealth: Payer: Self-pay | Admitting: Family Medicine

## 2015-06-03 NOTE — Telephone Encounter (Signed)
Patient called stating that their lips and face is swollen, rash on back, and he feels fatigued, he think it may be a reaction to lisinopril

## 2015-06-03 NOTE — Telephone Encounter (Signed)
Patient called front office staff, front office staff verified patient's date of birth.  Patient reports having rash on back for 1 week.  Patient has taken benadryl. When he wakes up his lips are swollen and has dry cough. Patient refuses to go to ED. Nurse explained importance of going to ED to be seen, nurse explained if face and lips are swollen his throat could swell and effect his breathing. Patient continues to refuse going to ED. Nurse explained to patient, nurse will consult with medical director. Patient reports Dr. Adrian Blackwater was going to change his lisinopril. Nurse spoke with Dr. Doreene Burke, medical director.  Patient aware of Dr. Doreene Burke requesting patient to go to ED.  Patient agrees to stop lisinopril and go to ED immediately.

## 2015-06-12 ENCOUNTER — Telehealth: Payer: Self-pay | Admitting: Family Medicine

## 2015-06-12 DIAGNOSIS — I10 Essential (primary) hypertension: Secondary | ICD-10-CM

## 2015-06-12 NOTE — Telephone Encounter (Signed)
Pt. Called stating that he thinks he is allergic to lisinopril and was told to stop taking it by the nurse. Pt. Stated he is not taking any HTN medication, and would like to know what he can take. Please f/u with pt.

## 2015-06-13 MED ORDER — AMLODIPINE BESYLATE 10 MG PO TABS: 10.0000 mg | ORAL_TABLET | Freq: Every day | ORAL | Status: DC

## 2015-06-13 NOTE — Telephone Encounter (Signed)
Patient called to check on the status of the medication change, patient stated that he would like medication before the holiday. Please f/u

## 2015-06-13 NOTE — Telephone Encounter (Signed)
Please inform patient that lisinopril has been discontinued and replaced with norvasc 10 mg daily He needs f/u appt with me It does sound like angioedema due to lisinopril  Lisinopril has been added to allergy list

## 2015-07-25 ENCOUNTER — Other Ambulatory Visit: Payer: Self-pay | Admitting: Family Medicine

## 2015-07-25 MED FILL — ?AMLODIPINE BESYLATE 10 MG: 10 | 30 days supply | Qty: 30 | Fill #1

## 2015-07-26 MED FILL — $ELIQUIS 5 MG TABLET: 5 | 30 days supply | Qty: 60 | Fill #0

## 2015-08-28 MED FILL — ?AMLODIPINE BESYLATE 10 MG: 10 | 30 days supply | Qty: 30 | Fill #2

## 2015-10-03 MED FILL — ?AMLODIPINE BESYLATE 10 MG: 10 | 30 days supply | Qty: 30 | Fill #3

## 2015-11-04 MED FILL — $ELIQUIS 5 MG TABLET: 5 | 30 days supply | Qty: 60 | Fill #1

## 2015-11-04 MED FILL — AMLODIPINE BESYLATE 10 MG T: 10 | 30 days supply | Qty: 30 | Fill #4

## 2015-11-22 ENCOUNTER — Other Ambulatory Visit: Payer: Self-pay | Admitting: Family Medicine

## 2015-11-22 MED FILL — raNITIdine HCL 150 MG TABS: 150 | 30 days supply | Qty: 60 | Fill #0

## 2015-12-06 MED FILL — AMLODIPINE BESYLATE 10 MG T: 10 | 30 days supply | Qty: 30 | Fill #5

## 2015-12-10 ENCOUNTER — Ambulatory Visit: Payer: Self-pay | Admitting: Family Medicine

## 2015-12-31 MED FILL — $ELIQUIS 5 MG TABLET: 5 | 30 days supply | Qty: 60 | Fill #2

## 2016-01-06 ENCOUNTER — Telehealth: Payer: Self-pay | Admitting: Family Medicine

## 2016-01-06 MED FILL — AMLODIPINE BESYLATE 10 MG T: 10 | 30 days supply | Qty: 30 | Fill #6

## 2016-01-06 NOTE — Telephone Encounter (Signed)
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  Forms placed in PCP box Pt was informed forms take up to 7-14 business days.  Please inform pt if this can not be done Thank you

## 2016-01-06 NOTE — Telephone Encounter (Signed)
Will forward to pcp

## 2016-01-07 NOTE — Telephone Encounter (Signed)
Disability parking placard form filled out and ready for pick up

## 2016-01-07 NOTE — Telephone Encounter (Signed)
Called patient Verified name and DOB Informed him that placard is up front for pick up

## 2016-02-13 MED FILL — AMLODIPINE BESYLATE 10 MG T: 10 | 30 days supply | Qty: 30 | Fill #7

## 2016-03-16 MED FILL — $ELIQUIS 5 MG TABLET: 5 | 30 days supply | Qty: 60 | Fill #3

## 2016-03-19 MED FILL — AMLODIPINE BESYLATE 10 MG T: 10 | 30 days supply | Qty: 30 | Fill #8

## 2016-04-02 MED FILL — raNITIdine HCL 150 MG TABS: 150 | 30 days supply | Qty: 60 | Fill #1

## 2016-04-17 MED FILL — ?AMLODIPINE BESYLATE 10 MG: 10 | 30 days supply | Qty: 30 | Fill #9

## 2016-05-11 ENCOUNTER — Other Ambulatory Visit: Payer: Self-pay | Admitting: Pharmacist

## 2016-05-11 ENCOUNTER — Telehealth: Payer: Self-pay | Admitting: Family Medicine

## 2016-05-11 MED ORDER — TRUE METRIX METER W/DEVICE KIT: PACK | 0 refills | Status: DC

## 2016-05-11 MED ORDER — GLUCOSE BLOOD VI STRP: ORAL_STRIP | 12 refills | Status: DC

## 2016-05-11 MED FILL — ELIQUIS 5 MG TABLET: 5 | 30 days supply | Qty: 60 | Fill #4

## 2016-05-11 MED FILL — TRUE METRIX BLOOD GLUCOSE M: W/DEVICE | 1 days supply | Qty: 1 | Fill #0

## 2016-05-11 NOTE — Telephone Encounter (Signed)
Pt was called and informed of blood glucose meter being sent to pharmacy onsite.

## 2016-05-11 NOTE — Telephone Encounter (Signed)
Patient's glucose meter no longer works, and patient would like to know if another one can be ordered. Please follow up

## 2016-05-25 MED FILL — AMLODIPINE BESYLATE 10 MG T: 10 | 30 days supply | Qty: 30 | Fill #10

## 2016-06-26 ENCOUNTER — Other Ambulatory Visit: Payer: Self-pay | Admitting: Family Medicine

## 2016-06-26 DIAGNOSIS — I10 Essential (primary) hypertension: Secondary | ICD-10-CM

## 2016-06-30 MED FILL — AMLODIPINE BESYLATE 10 MG T: 10 | 30 days supply | Qty: 30 | Fill #0

## 2016-06-30 NOTE — Telephone Encounter (Signed)
Patient is requesting amlodipine refill..... Please follow up

## 2016-07-02 ENCOUNTER — Telehealth: Payer: Self-pay | Admitting: Family Medicine

## 2016-07-02 MED ORDER — AMLODIPINE BESYLATE 10 MG PO TABS: 10.0000 mg | ORAL_TABLET | Freq: Every day | ORAL | 0 refills | Status: DC

## 2016-07-02 NOTE — Telephone Encounter (Signed)
Pt. Has an appt. Scheduled for 07/06/16.

## 2016-07-02 NOTE — Telephone Encounter (Signed)
Please call patient to schedule HTN f/u appt He will not receive additional BP medication refills if he does not follow up

## 2016-07-02 NOTE — Addendum Note (Signed)
Addended by: Boykin Nearing on: 07/02/2016 08:36 AM   Modules accepted: Orders

## 2016-07-06 ENCOUNTER — Ambulatory Visit: Payer: Self-pay | Admitting: Family Medicine

## 2016-07-13 MED FILL — raNITIdine HCL 150 MG TABS: 150 | 30 days supply | Qty: 60 | Fill #2

## 2016-07-20 MED FILL — !ELIQUIS 5 MG TABLET: 5 | 30 days supply | Qty: 60 | Fill #5

## 2016-07-27 MED FILL — ?AMLODIPINE BESYLATE 10 MG: 10 | 30 days supply | Qty: 30 | Fill #0

## 2016-09-02 ENCOUNTER — Other Ambulatory Visit: Payer: Self-pay | Admitting: Family Medicine

## 2016-09-02 DIAGNOSIS — I10 Essential (primary) hypertension: Secondary | ICD-10-CM

## 2016-09-04 MED FILL — ?AMLODIPINE BESYLATE 10 MG: 10 | 30 days supply | Qty: 30 | Fill #0

## 2016-09-21 ENCOUNTER — Other Ambulatory Visit: Payer: Self-pay | Admitting: Family Medicine

## 2016-09-24 ENCOUNTER — Telehealth: Payer: Self-pay | Admitting: Family Medicine

## 2016-09-24 NOTE — Telephone Encounter (Signed)
Forms has been started and placed in mailbox.

## 2016-09-24 NOTE — Telephone Encounter (Signed)
Patient has not been seen since 05/2015 6 month placcard ordered, for longer placcard ov needed He does have known hx of CVA with L sided weakness

## 2016-09-24 NOTE — Telephone Encounter (Signed)
Pt came in asking for Dr Adrian Blackwater to fill out paperwork for a hamdicap placard. Would like a call when it is ready to pick up please. Please f/u

## 2016-09-25 MED FILL — $ELIQUIS 5 MG TABLET: 5 | 30 days supply | Qty: 60 | Fill #0

## 2016-09-28 ENCOUNTER — Ambulatory Visit: Payer: Self-pay | Attending: Family Medicine

## 2016-09-28 NOTE — Telephone Encounter (Signed)
Pt was called and informed of paperwork being ready for pick up. 

## 2016-09-30 ENCOUNTER — Other Ambulatory Visit: Payer: Self-pay

## 2016-09-30 MED ORDER — APIXABAN 5 MG PO TABS: ORAL_TABLET | ORAL | 3 refills | Status: DC

## 2016-10-08 ENCOUNTER — Other Ambulatory Visit: Payer: Self-pay | Admitting: Family Medicine

## 2016-10-08 DIAGNOSIS — I10 Essential (primary) hypertension: Secondary | ICD-10-CM

## 2016-10-12 MED FILL — ?AMLODIPINE BESYLATE 10 MG: 10 | 30 days supply | Qty: 30 | Fill #0

## 2016-10-12 MED FILL — TRUE METRIX TEST STRIP: 30 days supply | Qty: 100 | Fill #0

## 2016-10-13 MED FILL — $ELIQUIS 5 MG TABLET: 5 | 30 days supply | Qty: 60 | Fill #0

## 2016-11-04 ENCOUNTER — Encounter: Payer: Self-pay | Admitting: Family Medicine

## 2016-11-10 ENCOUNTER — Other Ambulatory Visit: Payer: Self-pay | Admitting: Family Medicine

## 2016-11-10 DIAGNOSIS — I10 Essential (primary) hypertension: Secondary | ICD-10-CM

## 2016-11-12 ENCOUNTER — Other Ambulatory Visit: Payer: Self-pay | Admitting: Family Medicine

## 2016-11-12 DIAGNOSIS — I10 Essential (primary) hypertension: Secondary | ICD-10-CM

## 2016-11-12 NOTE — Telephone Encounter (Signed)
Pt. Called requesting a refill on amLODipine (NORVASC) 10 MG tablet and ranitidine (ZANTAC) 150 MG tablet  Pt. Uses [facility]. Please f/u with pt.

## 2016-11-17 ENCOUNTER — Telehealth: Payer: Self-pay | Admitting: Family Medicine

## 2016-11-17 DIAGNOSIS — I10 Essential (primary) hypertension: Secondary | ICD-10-CM

## 2016-11-17 MED ORDER — AMLODIPINE BESYLATE 10 MG PO TABS: 10.0000 mg | ORAL_TABLET | Freq: Every day | ORAL | 0 refills | Status: DC

## 2016-11-17 MED ORDER — RANITIDINE HCL 150 MG PO TABS: 150.0000 mg | ORAL_TABLET | Freq: Two times a day (BID) | ORAL | 0 refills | Status: DC

## 2016-11-17 MED FILL — raNITIdine HCL 150 MG TABS: 150 | 30 days supply | Qty: 60 | Fill #0

## 2016-11-17 MED FILL — ?AMLODIPINE BESYLATE 10 MG: 10 | 30 days supply | Qty: 30 | Fill #0

## 2016-11-17 NOTE — Telephone Encounter (Signed)
Pt calling to request refill of amlodipine and Zantac. Has appt scheduled for 11/24/16 but would like to know if he may get a script to last until his appt. Please f/u. Thank you.

## 2016-11-17 NOTE — Telephone Encounter (Signed)
meds refilled 

## 2016-11-18 NOTE — Telephone Encounter (Signed)
Pt was called and informed of refills. 

## 2016-11-24 ENCOUNTER — Ambulatory Visit: Payer: Self-pay | Attending: Family Medicine | Admitting: Family Medicine

## 2016-11-24 ENCOUNTER — Encounter: Payer: Self-pay | Admitting: Family Medicine

## 2016-11-24 VITALS — BP 136/91 | HR 91 | Temp 98.0°F | Wt 202.8 lb

## 2016-11-24 DIAGNOSIS — I480 Paroxysmal atrial fibrillation: Secondary | ICD-10-CM

## 2016-11-24 DIAGNOSIS — N529 Male erectile dysfunction, unspecified: Secondary | ICD-10-CM

## 2016-11-24 DIAGNOSIS — E785 Hyperlipidemia, unspecified: Secondary | ICD-10-CM | POA: Insufficient documentation

## 2016-11-24 DIAGNOSIS — I6930 Unspecified sequelae of cerebral infarction: Secondary | ICD-10-CM

## 2016-11-24 DIAGNOSIS — E7800 Pure hypercholesterolemia, unspecified: Secondary | ICD-10-CM

## 2016-11-24 DIAGNOSIS — Z87891 Personal history of nicotine dependence: Secondary | ICD-10-CM | POA: Insufficient documentation

## 2016-11-24 DIAGNOSIS — E119 Type 2 diabetes mellitus without complications: Secondary | ICD-10-CM | POA: Insufficient documentation

## 2016-11-24 DIAGNOSIS — I10 Essential (primary) hypertension: Secondary | ICD-10-CM

## 2016-11-24 DIAGNOSIS — I69354 Hemiplegia and hemiparesis following cerebral infarction affecting left non-dominant side: Secondary | ICD-10-CM

## 2016-11-24 DIAGNOSIS — E1149 Type 2 diabetes mellitus with other diabetic neurological complication: Secondary | ICD-10-CM

## 2016-11-24 DIAGNOSIS — Z114 Encounter for screening for human immunodeficiency virus [HIV]: Secondary | ICD-10-CM

## 2016-11-24 DIAGNOSIS — K219 Gastro-esophageal reflux disease without esophagitis: Secondary | ICD-10-CM

## 2016-11-24 LAB — GLUCOSE, POCT (MANUAL RESULT ENTRY): POC GLUCOSE: 177 mg/dL — AB (ref 70–99)

## 2016-11-24 LAB — POCT GLYCOSYLATED HEMOGLOBIN (HGB A1C): Hemoglobin A1C: 7.6

## 2016-11-24 MED ORDER — FENOFIBRATE 160 MG PO TABS: 160.0000 mg | ORAL_TABLET | Freq: Every day | ORAL | 11 refills | Status: DC

## 2016-11-24 MED ORDER — RANITIDINE HCL 150 MG PO TABS: 150.0000 mg | ORAL_TABLET | Freq: Two times a day (BID) | ORAL | 11 refills | Status: DC

## 2016-11-24 MED ORDER — SILDENAFIL CITRATE 100 MG PO TABS: 50.0000 mg | ORAL_TABLET | Freq: Every day | ORAL | 0 refills | Status: DC | PRN

## 2016-11-24 MED ORDER — AMLODIPINE BESYLATE 10 MG PO TABS: 10.0000 mg | ORAL_TABLET | Freq: Every day | ORAL | 11 refills | Status: DC

## 2016-11-24 MED FILL — !VIAGRA 100MG TABLET: 100 | 15 days supply | Qty: 2 | Fill #0

## 2016-11-24 MED FILL — FENOFIBRATE 160 MG TABLET: 160 | 30 days supply | Qty: 30 | Fill #0

## 2016-11-24 NOTE — Assessment & Plan Note (Signed)
A: CVA in 04/2014 with L sided weakness. Patient was uninsured at the time and did not receive PT P: neuro rehab referral placed

## 2016-11-24 NOTE — Assessment & Plan Note (Signed)
A: A fib  CHAD2Vasc score of 4 (HTN-1, CVA-2 diabetes-1)  He is in sinus rhythm today He will remain anticoagulated on eliquis

## 2016-11-24 NOTE — Assessment & Plan Note (Signed)
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

## 2016-11-24 NOTE — Assessment & Plan Note (Signed)
A: diet controlled, no medications.  P: Foot exam done today Urine microalbumin ordered Referral to opthalmology for yearly eye exam

## 2016-11-24 NOTE — Progress Notes (Signed)
Subjective:  Patient ID: [name], male    DOB: [birth year]  Age: 66 y.o. MRN: [id number]  CC: Diabetes   HPI Loren C Nestle has HTN, diabetes, hx of CVA 04/2014, A fib anticoagulated on eliquis, HLD, hx of coacine abuse and anxiety he  Presents to re-estabilish care  for   1. CHRONIC DIABETES  Disease Monitoring  Blood Sugar Ranges: normal   Polyuria: no   Visual problems: no   Medication Compliance: no medications   Medication Side Effects  Hypoglycemia: no   Preventitive Health Care  Eye Exam: due   Foot Exam: done today     2. CHRONIC HYPERTENSION  Disease Monitoring  Blood pressure range: not checking   Chest pain: no   Dyspnea: no   Claudication: no   Medication compliance: yes  Medication Side Effects  Lightheadedness: no   Urinary frequency: no   Edema: no   Impotence: no   3. A fib: he has A fib with history of CVA. He takes Eliquis. He denies bleeding and bruising.   4. Erectile dysfunction: for the past 10 years. He has trouble getting an erection. Also trouble keeping.   Social History  Substance Use Topics  . Smoking status: Former Smoker    Types: Cigarettes    Quit date: 05/25/2014  . Smokeless tobacco: Never Used     Comment: Quit 05/15/14  . Alcohol use No    Outpatient Medications Prior to Visit  Medication Sig Dispense Refill  . amLODipine (NORVASC) 10 MG tablet Take 1 tablet (10 mg total) by mouth daily. 30 tablet 0  . apixaban (ELIQUIS) 5 MG TABS tablet TAKE ONE TABLET 2 TIMES A DAY 180 tablet 3  . Blood Glucose Monitoring Suppl (TRUE METRIX METER) w/Device KIT Use as directed 1 kit 0  . fenofibrate 160 MG tablet Take 1 tablet (160 mg total) by mouth daily. 90 tablet 1  . GlucoCom Lancets MISC 100 Units by Does not apply route once. 100 each 0  . glucose blood (TRUE METRIX BLOOD GLUCOSE TEST) test strip Use as instructed 100 each 12  . ranitidine (ZANTAC) 150 MG tablet Take 1 tablet (150 mg total) by mouth 2 (two) times  daily. 60 tablet 0   No facility-administered medications prior to visit.     ROS Review of Systems  Constitutional: Negative for chills, fatigue, fever and unexpected weight change.  Eyes: Negative for visual disturbance.  Respiratory: Negative for cough and shortness of breath.   Cardiovascular: Negative for chest pain, palpitations and leg swelling.  Gastrointestinal: Negative for abdominal pain, blood in stool, constipation, diarrhea, nausea and vomiting.  Endocrine: Negative for polydipsia, polyphagia and polyuria.  Musculoskeletal: Negative for arthralgias, back pain, gait problem, myalgias and neck pain.  Skin: Negative for rash.  Allergic/Immunologic: Negative for immunocompromised state.  Hematological: Negative for adenopathy. Does not bruise/bleed easily.  Psychiatric/Behavioral: Negative for dysphoric mood, sleep disturbance and suicidal ideas. The patient is not nervous/anxious.     Objective:  BP (!) 136/91   Pulse 91   Temp 98 F (36.7 C) (Oral)   Wt 202 lb 12.8 oz (92 kg)   SpO2 96%   BMI 26.76 kg/m   BP/Weight 11/24/2016 01/21/2015 2/63/7858  Systolic BP 850 277 412  Diastolic BP 91 93 78  Wt. (Lbs) 202.8 211 214  BMI 26.76 27.84 28.24     Physical Exam  Constitutional: He appears well-developed and well-nourished. No distress.  HENT:  Head: Normocephalic and atraumatic.  Neck: Normal range of motion. Neck supple.  Cardiovascular: Normal rate, regular rhythm, normal heart sounds and intact distal pulses.   Pulmonary/Chest: Effort normal and breath sounds normal.  Musculoskeletal: He exhibits no edema.  Neurological: He is alert. No cranial nerve deficit or sensory deficit. He exhibits normal muscle tone. Gait (he walks with a cane ) abnormal.  Skin: Skin is warm and dry. No rash noted. No erythema.  Psychiatric: He has a normal mood and affect.    Lab Results  Component Value Date   HGBA1C 7.6 11/24/2016   CBG 177  Assessment & Plan:   Sevin  was seen today for diabetes.  Diagnoses and all orders for this visit:  Type 2 diabetes mellitus with other neurologic complication, unspecified whether long term insulin use (HCC) -     POCT glucose (manual entry) -     POCT glycosylated hemoglobin (Hb A1C) -     POCT UA - Microalbumin -     CMP14+EGFR -     Ambulatory referral to Ophthalmology  Essential hypertension -     CMP14+EGFR -     amLODipine (NORVASC) 10 MG tablet; Take 1 tablet (10 mg total) by mouth daily.  Pure hypercholesterolemia -     Lipid Panel -     fenofibrate 160 MG tablet; Take 1 tablet (160 mg total) by mouth daily.  Screening for HIV (human immunodeficiency virus) -     HIV antibody (with reflex)  ERECTILE DYSFUNCTION, ORGANIC -     sildenafil (VIAGRA) 100 MG tablet; Take 0.5-1 tablets (50-100 mg total) by mouth daily as needed for erectile dysfunction.  Gastroesophageal reflux disease, esophagitis presence not specified -     ranitidine (ZANTAC) 150 MG tablet; Take 1 tablet (150 mg total) by mouth 2 (two) times daily.  Hemiparesis affecting left side as late effect of stroke [facility]) -     Ambulatory Referral to Neuro Rehab  Paroxysmal atrial fibrillation (HCC)  History of CVA with residual deficit    No orders of the defined types were placed in this encounter.   Follow-up: Return in about 3 months (around 02/24/2017) for HTN and diabetes.   Boykin Nearing MD

## 2016-11-24 NOTE — Assessment & Plan Note (Signed)
A: slightly elevated BP Med: compliant with norvasc 10 mg daily P: Continue norvasc 10 mg daily Low salt diet Regular exercise

## 2016-11-24 NOTE — Patient Instructions (Addendum)
Ade was seen today for diabetes.  Diagnoses and all orders for this visit:  Type 2 diabetes mellitus with other neurologic complication, unspecified whether long term insulin use (HCC) -     POCT glucose (manual entry) -     POCT glycosylated hemoglobin (Hb A1C) -     POCT UA - Microalbumin -     CMP14+EGFR  Essential hypertension -     CMP14+EGFR -     amLODipine (NORVASC) 10 MG tablet; Take 1 tablet (10 mg total) by mouth daily.  Pure hypercholesterolemia -     Lipid Panel -     fenofibrate 160 MG tablet; Take 1 tablet (160 mg total) by mouth daily.  Screening for HIV (human immunodeficiency virus) -     HIV antibody (with reflex)  ERECTILE DYSFUNCTION, ORGANIC -     sildenafil (VIAGRA) 100 MG tablet; Take 0.5-1 tablets (50-100 mg total) by mouth daily as needed for erectile dysfunction.  Gastroesophageal reflux disease, esophagitis presence not specified -     ranitidine (ZANTAC) 150 MG tablet; Take 1 tablet (150 mg total) by mouth 2 (two) times daily.  Hemiparesis affecting left side as late effect of stroke [facility]) -     Ambulatory Referral to Neuro Rehab   Remember do not mix Viagra with nitroglycerin  F/u in 3 months for diabetes and HTN   Dr. Adrian Blackwater

## 2016-11-25 LAB — CMP14+EGFR
A/G RATIO: 1.6 (ref 1.2–2.2)
ALT: 11 IU/L (ref 0–44)
AST: 17 IU/L (ref 0–40)
Albumin: 4.5 g/dL (ref 3.6–4.8)
Alkaline Phosphatase: 76 IU/L (ref 39–117)
BUN/Creatinine Ratio: 14 (ref 10–24)
BUN: 17 mg/dL (ref 8–27)
Bilirubin Total: 0.3 mg/dL (ref 0.0–1.2)
CALCIUM: 9.7 mg/dL (ref 8.6–10.2)
CO2: 22 mmol/L (ref 18–29)
Chloride: 102 mmol/L (ref 96–106)
Creatinine, Ser: 1.23 mg/dL (ref 0.76–1.27)
GFR calc Af Amer: 71 mL/min/{1.73_m2} (ref >59)
GFR, EST NON AFRICAN AMERICAN: 62 mL/min/{1.73_m2} (ref >59)
Globulin, Total: 2.9 g/dL (ref 1.5–4.5)
Glucose: 148 mg/dL — ABNORMAL HIGH (ref 65–99)
POTASSIUM: 3.9 mmol/L (ref 3.5–5.2)
Sodium: 138 mmol/L (ref 134–144)
Total Protein: 7.4 g/dL (ref 6.0–8.5)

## 2016-11-25 LAB — LIPID PANEL
CHOL/HDL RATIO: 3.9 ratio (ref 0.0–5.0)
Cholesterol, Total: 203 mg/dL — ABNORMAL HIGH (ref 100–199)
HDL: 52 mg/dL (ref >39)
LDL Calculated: 94 mg/dL (ref 0–99)
TRIGLYCERIDES: 286 mg/dL — AB (ref 0–149)
VLDL Cholesterol Cal: 57 mg/dL — ABNORMAL HIGH (ref 5–40)

## 2016-11-25 LAB — HIV ANTIBODY (ROUTINE TESTING W REFLEX): HIV Screen 4th Generation wRfx: NONREACTIVE

## 2016-11-26 MED ORDER — PRAVASTATIN SODIUM 10 MG PO TABS: 10.0000 mg | ORAL_TABLET | Freq: Every day | ORAL | 5 refills | Status: DC

## 2016-11-26 MED FILL — PRAVASTATIN NA 10 MG TAB: 10 | 30 days supply | Qty: 30 | Fill #0

## 2016-11-26 NOTE — Telephone Encounter (Signed)
Pt. Called stating he received a letter from the referral coordinator regarding a referral to opthalmology. Pt. States he never spoke with his PCP about the referral, pt. States he spoke with her about being referred to physical therapy. Please f/u with pt.

## 2016-11-26 NOTE — Addendum Note (Signed)
Addended by: Boykin Nearing on: 11/26/2016 08:10 AM   Modules accepted: Orders

## 2016-11-27 NOTE — Telephone Encounter (Signed)
Pt was called and informed of PT referral being placed.

## 2016-12-04 ENCOUNTER — Encounter: Payer: Self-pay | Admitting: Family Medicine

## 2016-12-04 ENCOUNTER — Ambulatory Visit: Payer: Self-pay | Attending: Family Medicine | Admitting: Family Medicine

## 2016-12-04 VITALS — BP 130/87 | HR 70 | Temp 98.0°F | Resp 16 | Wt 203.2 lb

## 2016-12-04 DIAGNOSIS — I4891 Unspecified atrial fibrillation: Secondary | ICD-10-CM | POA: Insufficient documentation

## 2016-12-04 DIAGNOSIS — Z87891 Personal history of nicotine dependence: Secondary | ICD-10-CM | POA: Insufficient documentation

## 2016-12-04 DIAGNOSIS — I10 Essential (primary) hypertension: Secondary | ICD-10-CM | POA: Insufficient documentation

## 2016-12-04 DIAGNOSIS — Z794 Long term (current) use of insulin: Secondary | ICD-10-CM | POA: Insufficient documentation

## 2016-12-04 DIAGNOSIS — Z8673 Personal history of transient ischemic attack (TIA), and cerebral infarction without residual deficits: Secondary | ICD-10-CM | POA: Insufficient documentation

## 2016-12-04 DIAGNOSIS — R8299 Other abnormal findings in urine: Secondary | ICD-10-CM

## 2016-12-04 DIAGNOSIS — R82998 Other abnormal findings in urine: Secondary | ICD-10-CM

## 2016-12-04 DIAGNOSIS — F419 Anxiety disorder, unspecified: Secondary | ICD-10-CM | POA: Insufficient documentation

## 2016-12-04 DIAGNOSIS — Z7901 Long term (current) use of anticoagulants: Secondary | ICD-10-CM | POA: Insufficient documentation

## 2016-12-04 DIAGNOSIS — E781 Pure hyperglyceridemia: Secondary | ICD-10-CM | POA: Insufficient documentation

## 2016-12-04 DIAGNOSIS — E119 Type 2 diabetes mellitus without complications: Secondary | ICD-10-CM | POA: Insufficient documentation

## 2016-12-04 DIAGNOSIS — Z79899 Other long term (current) drug therapy: Secondary | ICD-10-CM | POA: Insufficient documentation

## 2016-12-04 DIAGNOSIS — R5383 Other fatigue: Secondary | ICD-10-CM | POA: Insufficient documentation

## 2016-12-04 DIAGNOSIS — E1149 Type 2 diabetes mellitus with other diabetic neurological complication: Secondary | ICD-10-CM

## 2016-12-04 LAB — GLUCOSE, POCT (MANUAL RESULT ENTRY): POC Glucose: 127 mg/dl — AB (ref 70–99)

## 2016-12-04 LAB — POCT UA - MICROALBUMIN
CREATININE, POC: 50 mg/dL
MICROALBUMIN (UR) POC: 10 mg/L

## 2016-12-04 LAB — POCT URINALYSIS DIPSTICK
Bilirubin, UA: NEGATIVE
Glucose, UA: NEGATIVE
KETONES UA: NEGATIVE
Leukocytes, UA: NEGATIVE
Nitrite, UA: NEGATIVE
PH UA: 6.5 (ref 5.0–8.0)
PROTEIN UA: NEGATIVE
RBC UA: NEGATIVE
Urobilinogen, UA: 0.2 E.U./dL

## 2016-12-04 NOTE — Progress Notes (Signed)
Subjective:  Patient ID: [name], male    DOB: Jan 07, 1951  Age: 66 y.o. MRN: [id number]  CC: Fatigue and Dark Urine   HPI [name] has HTN, diabetes, hx of CVA 04/2014, A fib anticoagulated on eliquis, HLD, hx of coacine abuse and anxiety he presents  for   1. Fatigue: started yesterday. No muscle aches. Noticed dark and golden urine last night and this morning. He denies dysuria and hematuria. He denies myalgias and fever. One week ago he started pravastatin and fenofibrate for hyperlipidemia and hypertriglyceridemia.   Social History  Substance Use Topics  . Smoking status: Former Smoker    Types: Cigarettes    Quit date: 05/25/2014  . Smokeless tobacco: Never Used     Comment: Quit 05/15/14  . Alcohol use No    Outpatient Medications Prior to Visit  Medication Sig Dispense Refill  . amLODipine (NORVASC) 10 MG tablet Take 1 tablet (10 mg total) by mouth daily. 30 tablet 11  . apixaban (ELIQUIS) 5 MG TABS tablet TAKE ONE TABLET 2 TIMES A DAY 180 tablet 3  . Blood Glucose Monitoring Suppl (TRUE METRIX METER) w/Device KIT Use as directed 1 kit 0  . fenofibrate 160 MG tablet Take 1 tablet (160 mg total) by mouth daily. 30 tablet 11  . GlucoCom Lancets MISC 100 Units by Does not apply route once. 100 each 0  . glucose blood (TRUE METRIX BLOOD GLUCOSE TEST) test strip Use as instructed 100 each 12  . pravastatin (PRAVACHOL) 10 MG tablet Take 1 tablet (10 mg total) by mouth daily. 30 tablet 5  . ranitidine (ZANTAC) 150 MG tablet Take 1 tablet (150 mg total) by mouth 2 (two) times daily. 60 tablet 11  . sildenafil (VIAGRA) 100 MG tablet Take 0.5-1 tablets (50-100 mg total) by mouth daily as needed for erectile dysfunction. 2 tablet 0   No facility-administered medications prior to visit.     ROS Review of Systems  Constitutional: Negative for chills, fatigue, fever and unexpected weight change.  Eyes: Negative for visual disturbance.  Respiratory: Negative for  cough and shortness of breath.   Cardiovascular: Negative for chest pain, palpitations and leg swelling.  Gastrointestinal: Negative for abdominal pain, blood in stool, constipation, diarrhea, nausea and vomiting.  Endocrine: Negative for polydipsia, polyphagia and polyuria.  Musculoskeletal: Negative for arthralgias, back pain, gait problem, myalgias and neck pain.  Skin: Negative for rash.  Allergic/Immunologic: Negative for immunocompromised state.  Hematological: Negative for adenopathy. Does not bruise/bleed easily.  Psychiatric/Behavioral: Negative for dysphoric mood, sleep disturbance and suicidal ideas. The patient is not nervous/anxious.     Objective:  BP 130/87   Pulse 70   Temp 98 F (36.7 C) (Oral)   Resp 16   Wt 203 lb 3.2 oz (92.2 kg)   SpO2 97%   BMI 26.81 kg/m   BP/Weight 12/04/2016 11/22/3352 10/25/2561  Systolic BP 893 734 287  Diastolic BP 87 91 93  Wt. (Lbs) 203.2 202.8 211  BMI 26.81 26.76 27.84     Physical Exam  Constitutional: He appears well-developed and well-nourished. No distress.  HENT:  Head: Normocephalic and atraumatic.  Neck: Normal range of motion. Neck supple.  Cardiovascular: Normal rate, regular rhythm, normal heart sounds and intact distal pulses.   Pulmonary/Chest: Effort normal and breath sounds normal.  Abdominal: There is no CVA tenderness.  Musculoskeletal: He exhibits no edema or tenderness.  Neurological: He is alert. No cranial nerve deficit or sensory deficit. He exhibits normal  muscle tone. Gait (he walks with a cane ) abnormal.  Skin: Skin is warm and dry. No rash noted. No erythema.  Psychiatric: He has a normal mood and affect.    Lab Results  Component Value Date   HGBA1C 7.6 11/24/2016   CBG 127  UA: yellow/clear, negative bilirubin, negative blood  Assessment & Plan:   [name] was seen today for fatigue and dark urine.  Diagnoses and all orders for this visit:  Type 2 diabetes mellitus with other neurologic  complication, unspecified whether long term insulin use (HCC) -     POCT UA - Microalbumin -     POCT glucose (manual entry)  Dark urine -     POCT urinalysis dipstick -     CMP14+EGFR -     CK  Fatigue, unspecified type -     CBC    No orders of the defined types were placed in this encounter.   Follow-up: Return in about 3 months (around 03/06/2017).   Boykin Nearing MD

## 2016-12-04 NOTE — Patient Instructions (Addendum)
Dawood was seen today for fatigue and dark urine.  Diagnoses and all orders for this visit:  Type 2 diabetes mellitus with other neurologic complication, unspecified whether long term insulin use (HCC) -     POCT UA - Microalbumin -     POCT glucose (manual entry)  Dark urine -     POCT urinalysis dipstick -     CMP14+EGFR -     CK  Fatigue, unspecified type -     CBC   Do not take fenofibrate or pravastatin until you are called with lab results Drink extra water I will call you with lab results.  Keep 3 months f/u appt already scheduled. You will be called if sooner appointment needed.  Dr. Adrian Blackwater

## 2016-12-04 NOTE — Progress Notes (Signed)
Pt is in the office today for dark urine and fatigue Pt states he notice his urine being a dark gold color this morning Pt states his fatigue started a day and half ago Pt states he thinks it is from a new medication he is taking Pt states he is not in any pain

## 2016-12-05 DIAGNOSIS — R82998 Other abnormal findings in urine: Secondary | ICD-10-CM | POA: Insufficient documentation

## 2016-12-05 LAB — CMP14+EGFR
ALT: 11 IU/L (ref 0–44)
AST: 12 IU/L (ref 0–40)
Albumin/Globulin Ratio: 1.7 (ref 1.2–2.2)
Albumin: 4.5 g/dL (ref 3.6–4.8)
Alkaline Phosphatase: 69 IU/L (ref 39–117)
BUN/Creatinine Ratio: 14 (ref 10–24)
BUN: 16 mg/dL (ref 8–27)
Bilirubin Total: 0.5 mg/dL (ref 0.0–1.2)
CO2: 23 mmol/L (ref 20–29)
Calcium: 9.7 mg/dL (ref 8.6–10.2)
Chloride: 99 mmol/L (ref 96–106)
Creatinine, Ser: 1.17 mg/dL (ref 0.76–1.27)
GFR calc Af Amer: 76 mL/min/{1.73_m2} (ref >59)
GFR calc non Af Amer: 65 mL/min/{1.73_m2} (ref >59)
Globulin, Total: 2.6 g/dL (ref 1.5–4.5)
Glucose: 128 mg/dL — ABNORMAL HIGH (ref 65–99)
Potassium: 4.2 mmol/L (ref 3.5–5.2)
Sodium: 135 mmol/L (ref 134–144)
Total Protein: 7.1 g/dL (ref 6.0–8.5)

## 2016-12-05 LAB — CBC
Hematocrit: 40.3 % (ref 37.5–51.0)
Hemoglobin: 13.9 g/dL (ref 13.0–17.7)
MCH: 30.1 pg (ref 26.6–33.0)
MCHC: 34.5 g/dL (ref 31.5–35.7)
MCV: 87 fL (ref 79–97)
Platelets: 278 10*3/uL (ref 150–379)
RBC: 4.62 x10E6/uL (ref 4.14–5.80)
RDW: 14.8 % (ref 12.3–15.4)
WBC: 6.5 10*3/uL (ref 3.4–10.8)

## 2016-12-05 LAB — CK: Total CK: 77 U/L (ref 24–204)

## 2016-12-05 NOTE — Assessment & Plan Note (Signed)
Dark urine Normal UA -no blood or bilirubin  Normal CBC, CK and CBC Plan: Reassurance Patient to stop fenofibrate and take pravastatin for hyperlipidemia with hypertriglyceridemia

## 2016-12-10 ENCOUNTER — Telehealth: Payer: Self-pay

## 2016-12-10 NOTE — Telephone Encounter (Signed)
Pt was called and a VM was left informing pt to return phone call for lab results. 

## 2016-12-17 MED FILL — ?AMLODIPINE BESYLATE 10 MG: 10 | 30 days supply | Qty: 30 | Fill #0

## 2016-12-28 ENCOUNTER — Ambulatory Visit: Payer: Self-pay | Attending: Family Medicine

## 2016-12-29 ENCOUNTER — Telehealth: Payer: Self-pay | Admitting: Family Medicine

## 2016-12-29 NOTE — Telephone Encounter (Signed)
Called to patient Left VM  Foot swelling is either a side effect of amlodipine or due to too much salt  If he has not had extra salt  He is advised that the amlodipine change be changed to a thiazide diuretic to control his BP If he would like this please send in chlorthalidone 25 mg daily   He is asked to call back if he would like to change BP medication

## 2016-12-29 NOTE — Telephone Encounter (Signed)
Will route to PCP 

## 2016-12-29 NOTE — Telephone Encounter (Signed)
Pt called stating that he is experiencing swelling in his feet and would like PCP to contact him to discuss treatment options. Please f/u. Thank you.

## 2017-01-21 ENCOUNTER — Telehealth: Payer: Self-pay | Admitting: Internal Medicine

## 2017-01-21 DIAGNOSIS — I69354 Hemiplegia and hemiparesis following cerebral infarction affecting left non-dominant side: Secondary | ICD-10-CM

## 2017-01-21 DIAGNOSIS — I6930 Unspecified sequelae of cerebral infarction: Secondary | ICD-10-CM

## 2017-01-21 NOTE — Telephone Encounter (Signed)
Pt would like to have a referral to PT due to his past stroke  Thank  You

## 2017-01-22 NOTE — Telephone Encounter (Signed)
Will forward to pcp

## 2017-01-28 NOTE — Telephone Encounter (Signed)
Referral to neuro rehab placed

## 2017-01-29 MED FILL — AMLODIPINE BESYLATE 10 MG T: 10 | 30 days supply | Qty: 30 | Fill #1

## 2017-02-09 MED FILL — ELIQUIS 5 MG TABLET: 5 | 30 days supply | Qty: 60 | Fill #1

## 2017-02-25 ENCOUNTER — Ambulatory Visit: Payer: Self-pay | Admitting: Internal Medicine

## 2017-03-01 MED FILL — AMLODIPINE BESYLATE 10 MG T: 10 | 30 days supply | Qty: 30 | Fill #2

## 2017-03-23 ENCOUNTER — Ambulatory Visit: Payer: Medicare Other | Attending: Internal Medicine | Admitting: Internal Medicine

## 2017-03-23 ENCOUNTER — Encounter: Payer: Self-pay | Admitting: Internal Medicine

## 2017-03-23 VITALS — BP 128/71 | HR 79 | Temp 98.7°F | Resp 18 | Ht 73.5 in | Wt 194.0 lb

## 2017-03-23 DIAGNOSIS — E785 Hyperlipidemia, unspecified: Secondary | ICD-10-CM | POA: Diagnosis not present

## 2017-03-23 DIAGNOSIS — I69354 Hemiplegia and hemiparesis following cerebral infarction affecting left non-dominant side: Secondary | ICD-10-CM | POA: Diagnosis not present

## 2017-03-23 DIAGNOSIS — I480 Paroxysmal atrial fibrillation: Secondary | ICD-10-CM | POA: Insufficient documentation

## 2017-03-23 DIAGNOSIS — Z1211 Encounter for screening for malignant neoplasm of colon: Secondary | ICD-10-CM | POA: Diagnosis not present

## 2017-03-23 DIAGNOSIS — K069 Disorder of gingiva and edentulous alveolar ridge, unspecified: Secondary | ICD-10-CM | POA: Diagnosis not present

## 2017-03-23 DIAGNOSIS — E1149 Type 2 diabetes mellitus with other diabetic neurological complication: Secondary | ICD-10-CM

## 2017-03-23 DIAGNOSIS — Z87891 Personal history of nicotine dependence: Secondary | ICD-10-CM | POA: Insufficient documentation

## 2017-03-23 DIAGNOSIS — E119 Type 2 diabetes mellitus without complications: Secondary | ICD-10-CM | POA: Insufficient documentation

## 2017-03-23 DIAGNOSIS — K219 Gastro-esophageal reflux disease without esophagitis: Secondary | ICD-10-CM | POA: Insufficient documentation

## 2017-03-23 DIAGNOSIS — Z23 Encounter for immunization: Secondary | ICD-10-CM | POA: Insufficient documentation

## 2017-03-23 DIAGNOSIS — I10 Essential (primary) hypertension: Secondary | ICD-10-CM | POA: Diagnosis not present

## 2017-03-23 DIAGNOSIS — Z2821 Immunization not carried out because of patient refusal: Secondary | ICD-10-CM

## 2017-03-23 DIAGNOSIS — F1411 Cocaine abuse, in remission: Secondary | ICD-10-CM | POA: Insufficient documentation

## 2017-03-23 DIAGNOSIS — N529 Male erectile dysfunction, unspecified: Secondary | ICD-10-CM | POA: Insufficient documentation

## 2017-03-23 DIAGNOSIS — M25531 Pain in right wrist: Secondary | ICD-10-CM

## 2017-03-23 DIAGNOSIS — Z79899 Other long term (current) drug therapy: Secondary | ICD-10-CM | POA: Insufficient documentation

## 2017-03-23 DIAGNOSIS — R296 Repeated falls: Secondary | ICD-10-CM | POA: Insufficient documentation

## 2017-03-23 LAB — POCT GLYCOSYLATED HEMOGLOBIN (HGB A1C): HEMOGLOBIN A1C: 7.1

## 2017-03-23 LAB — GLUCOSE, POCT (MANUAL RESULT ENTRY): POC GLUCOSE: 163 mg/dL — AB (ref 70–99)

## 2017-03-23 MED ORDER — METFORMIN HCL 500 MG PO TABS: 250.0000 mg | ORAL_TABLET | Freq: Every day | ORAL | 3 refills | Status: DC

## 2017-03-23 MED ORDER — DICLOFENAC SODIUM 1 % TD GEL: 2.0000 g | Freq: Four times a day (QID) | TRANSDERMAL | 1 refills | Status: DC

## 2017-03-23 NOTE — Patient Instructions (Signed)
You have been referred to physical therapy.  Please go to [facility] radiology for x-ray of your wrist. Use the Voltaren gel pain rub in the mean time.   Start Low dose Metformin for your diabetes.   Continue taking Pravachol for cholesterol.

## 2017-03-23 NOTE — Progress Notes (Signed)
Patient ID: [name], male    DOB: Jul 14, 1950  MRN: [id number]  CC: Diabetes and Hypertension   Subjective: [name] is a 66 y.o. male who presents for chronic ds management His concerns today include:  Pt with hx of HTN, DM, CVA with Lt sided weakness 04/2014, a.fib on DOAG, HL, cocaine abuse in remission since 2005  1. Notice a swelling on RT wrist x2 mths. -no hx trauma -throbbing/pain wakes him up at nights. Wonders it is due to him using the cane in RT hand all the time.   2. HTN -compliant with Norvasc. Checks BP daily. Range 130s/80-90 -limits salt in foods. -stays active by taking care of his yard including mowing and enrolled in [employer] which he plans to start in a wk  3. CVA: never able to get rehab post CVA because he did not have insurance at the time. However, he has been using his treadmill and exercise bike. -feels unsteady when he tries to walk without his cane. Falls about 2 x a wk with no major injuries. "I've learn how to fall safe." He is on Elaquis -request handicap sticker  4. DM: -diet control. Was on Metformin in past. -checking BS daily. Range 125-160. Highest 180 -has a veggie garden so most of his veggies are fresh. Does his own cooking. Drinks 6-8 glasses of water a day. Avoids sugary drinks  5. HL: Suppose to be on Fenofibrate and Pravachol but pt states he was told to d/c both of last blood tests in June. However review Dr. Noni Saupe lab note indicates that she wanted him to remain on both  Soc: drinks wine 2 x a mth. No cig. Clean cocaine since 2005.   Patient Active Problem List   Diagnosis Date Noted  . Dark urine 12/05/2016  . Hyperlipemia 11/24/2016  . History of CVA with residual deficit 11/24/2016  . GERD (gastroesophageal reflux disease) 08/09/2014  . History of cocaine abuse   . Atrial fibrillation ([place])   . Hemiparesis affecting left side as late effect of stroke ([place]) 05/01/2014  . 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 INGUINAL PAIN, BILATERAL 07/17/2010  . DEGENERATIVE JOINT DISEASE, CERVICAL SPINE 04/24/2009  . NECK PAIN 04/11/2009  . DM2 (diabetes mellitus, type 2) ([place]) 07/03/2006  . Former smoker 07/03/2006  . HIATAL HERNIA WITH REFLUX 07/03/2006  . Essential hypertension 06/08/2006     Current Outpatient Prescriptions on File Prior to Visit  Medication Sig Dispense Refill  . amLODipine (NORVASC) 10 MG tablet Take 1 tablet (10 mg total) by mouth daily. 30 tablet 11  . apixaban (ELIQUIS) 5 MG TABS tablet TAKE ONE TABLET 2 TIMES A DAY 180 tablet 3  . Blood Glucose Monitoring Suppl (TRUE METRIX METER) w/Device KIT Use as directed 1 kit 0  . GlucoCom Lancets MISC 100 Units by Does not apply route once. 100 each 0  . glucose blood (TRUE METRIX BLOOD GLUCOSE TEST) test strip Use as instructed 100 each 12  . Multiple Vitamins-Minerals (MENS MULTIVITAMIN PLUS PO) Take 1 tablet by mouth daily.    . pravastatin (PRAVACHOL) 10 MG tablet Take 1 tablet (10 mg total) by mouth daily. 30 tablet 5  . ranitidine (ZANTAC) 150 MG tablet Take 1 tablet (150 mg total) by mouth 2 (two) times daily. 60 tablet 11  . sildenafil (VIAGRA) 100 MG tablet Take 0.5-1 tablets (50-100 mg total) by mouth daily as needed for erectile dysfunction. 2 tablet 0   No current facility-administered  medications on file prior to visit.     Allergies  Allergen Reactions  . Lisinopril Swelling    Social History   Social History  . Marital status: Married    Spouse name: N/A  . Number of children: N/A  . Years of education: N/A   Occupational History  . Caregiver     Adult client with cerebral palsy    Social History Main Topics  . Smoking status: Former Smoker    Types: Cigarettes    Quit date: 05/25/2014  . Smokeless tobacco: Never Used     Comment: Quit 05/15/14  . Alcohol use No  . Drug use: No  . Sexual activity: Not on file   Other Topics Concern  . Not on file   Social History Narrative    Lives with his wife   5 children, grown.   7 grandchildren.        Family History  Problem Relation Age of Onset  . Diabetes Mother   . Diabetes Father     History reviewed. No pertinent surgical history.  ROS: Review of Systems Negative except as stated above PHYSICAL EXAM: BP 128/71 (BP Location: Left Arm, Patient Position: Sitting, Cuff Size: Normal)   Pulse 79   Temp 98.7 F (37.1 C) (Oral)   Resp 18   Ht 6' 1.5" (1.867 m)   Wt 194 lb (88 kg)   SpO2 99%   BMI 25.25 kg/m   Physical Exam General appearance - alert, well appearing, older African-American male and in no distress Mental status - alert, oriented to person, place, and time, normal mood, behavior, speech, dress, motor activity, and thought processes Eyes - pupils equal and reactive, extraocular eye movements intact Nose - normal and patent, no erythema, discharge or polyps Mouth - mucous membranes moist, pharynx normal without lesions Neck - supple, no significant adenopathy Chest - clear to auscultation, no wheezes, rales or rhonchi, symmetric air entry Heart - normal rate, regular rhythm, normal S1, S2, no murmurs, rubs, clicks or gallops Neurological - Motor: Grip 4+ out of 5 on the right; 4/5 left.  Power upper extremities 5/5 right, 4/5 left.  Lower extremities: 5/5 right; 4/5 left proximal and distal Gait: Wide base and slow. Reduce foot to floor [name] on the left Extremities - no LE edema MSK: RT wrist: mild soft, enlargement over dorsal radial bone at wrist - soft and movable   Lab Results  Component Value Date   WBC 6.5 12/04/2016   HGB 13.9 12/04/2016   HCT 40.3 12/04/2016   MCV 87 12/04/2016   PLT 278 12/04/2016     Chemistry      Component Value Date/Time   NA 135 12/04/2016 1222   K 4.2 12/04/2016 1222   CL 99 12/04/2016 1222   CO2 23 12/04/2016 1222   BUN 16 12/04/2016 1222   CREATININE 1.17 12/04/2016 1222      Component Value Date/Time   CALCIUM 9.7 12/04/2016 1222    ALKPHOS 69 12/04/2016 1222   AST 12 12/04/2016 1222   ALT 11 12/04/2016 1222   BILITOT 0.5 12/04/2016 1222     Lab Results  Component Value Date   CHOL 203 (H) 11/24/2016   HDL 52 11/24/2016   LDLCALC 94 11/24/2016   TRIG 286 (H) 11/24/2016   CHOLHDL 3.9 11/24/2016   Results for orders placed or performed in visit on 03/23/17  POCT A1C  Result Value Ref Range   Hemoglobin A1C 7.1   Glucose (CBG)  Result Value Ref Range   POC Glucose 163 (A) 70 - 99 mg/dl     ASSESSMENT AND PLAN: 1. Type 2 diabetes mellitus with other neurologic complication, unspecified whether long term insulin use (HCC) -Will start him on low dose of metformin to get A1c below 7. Patient to continue monitoring blood sugars, healthy eating habits and regular exercise - POCT A1C - Glucose (CBG) - metFORMIN (GLUCOPHAGE) 500 MG tablet; Take 0.5 tablets (250 mg total) by mouth daily with breakfast.  Dispense: 45 tablet; Refill: 3  2. Hemiparesis affecting left side as late effect of stroke ([place]) -Given his unsteady gait due to left sided weakness and his reports of frequent falls I think he would benefit from physical therapy and patient is agreeable to this. - Ambulatory referral to Physical Therapy  3. Essential hypertension At goal. Continue current medication Norvasc. Intolerant of ACE-I  4. Paroxysmal atrial fibrillation (HCC) Continue Elaquis. Last set blood test including kidney function and CBC 11/2016 -Referred to physical therapy for gait training to help prevent falls.   5. Hyperlipidemia, unspecified hyperlipidemia type -Continue Pravachol  6. Right wrist pain -Baker's cysts versus arthritis. It is located over the radial bone which makes it a little difficult to decipher whether it is cystic. Sent for x-ray. Use Voltaren gel in the meantime - diclofenac sodium (VOLTAREN) 1 % GEL; Apply 2 g topically 4 (four) times daily.  Dispense: 100 g; Refill: 1 - DG Wrist Complete Right; Future  7. Need  for vaccination against Streptococcus pneumoniae using pneumococcal conjugate vaccine 13 - Pneumococcal conjugate vaccine 13-valent IM  8. Influenza vaccination declined   9. Gum disease  Ambulatory referral to Dentistry  10. Falls frequently - Ambulatory referral to Physical Therapy  11. Colon cancer screening - Ambulatory referral to Gastroenterology  Patient was given the opportunity to ask questions.  Patient verbalized understanding of the plan and was able to repeat key elements of the plan.   Orders Placed This Encounter  Procedures  . DG Wrist Complete Right  . Pneumococcal conjugate vaccine 13-valent IM  . Ambulatory referral to Dentistry  . Ambulatory referral to Gastroenterology  . Ambulatory referral to Physical Therapy  . POCT A1C  . Glucose (CBG)     Requested Prescriptions   Signed Prescriptions Disp Refills  . diclofenac sodium (VOLTAREN) 1 % GEL 100 g 1    Sig: Apply 2 g topically 4 (four) times daily.  . metFORMIN (GLUCOPHAGE) 500 MG tablet 45 tablet 3    Sig: Take 0.5 tablets (250 mg total) by mouth daily with breakfast.    Return in about 4 months (around 07/24/2017).  Karle Plumber, MD, FACP

## 2017-04-13 MED FILL — AMLODIPINE BESYLATE 10 MG T: 10 | 30 days supply | Qty: 30 | Fill #3

## 2017-04-19 ENCOUNTER — Ambulatory Visit: Payer: Medicare Other | Attending: Internal Medicine | Admitting: Physical Therapy

## 2017-04-19 ENCOUNTER — Encounter: Payer: Self-pay | Admitting: Physical Therapy

## 2017-04-19 DIAGNOSIS — I69354 Hemiplegia and hemiparesis following cerebral infarction affecting left non-dominant side: Secondary | ICD-10-CM

## 2017-04-19 DIAGNOSIS — R296 Repeated falls: Secondary | ICD-10-CM

## 2017-04-19 DIAGNOSIS — R2681 Unsteadiness on feet: Secondary | ICD-10-CM | POA: Diagnosis not present

## 2017-04-19 DIAGNOSIS — M6281 Muscle weakness (generalized): Secondary | ICD-10-CM

## 2017-04-19 DIAGNOSIS — R262 Difficulty in walking, not elsewhere classified: Secondary | ICD-10-CM

## 2017-04-19 MED FILL — metFORMIN HCL 500 MG TABS: 500 | 30 days supply | Qty: 15 | Fill #0

## 2017-04-19 MED FILL — VOLTAREN 1% GEL: 1 | 12 days supply | Qty: 100 | Fill #0

## 2017-04-19 NOTE — Therapy (Signed)
[facility] [address] [place], [place], [address] Phone: [phone]   Fax:  [phone]  Physical Therapy Evaluation  Patient Details  Name: [name] MRN: [id number] Date of Birth: 08/12/1950 Referring Provider: [name], MD  Encounter Date: 04/19/2017      PT End of Session - 04/19/17 1411    Visit Number 1   Number of Visits 17   Date for PT Re-Evaluation 06/18/17   Authorization Type Medicare primary, Medicaid secondary; G code and PN every 10th visit   PT Start Time 1315   PT Stop Time 1400   PT Time Calculation (min) 45 min   Activity Tolerance Patient tolerated treatment well   Behavior During Therapy [facility] for tasks assessed/performed      Past Medical History:  Diagnosis Date  . Abscess of anal and rectal regions 07/15/2007   Qualifier: Diagnosis of  By: Amil Amen MD, Benjamine Mola    . Atrial fibrillation ([place])   . Diabetes mellitus without complication ([place]) Dx 2423  . FISTULA, ANAL 01/27/2007   Qualifier: Diagnosis of  By: Amil Amen MD, Benjamine Mola    . Hypertension Dx 2008  . Stroke [facility])     History reviewed. No pertinent surgical history.  There were no vitals filed for this visit.       Subjective Assessment - 04/19/17 1309    Subjective Pt presents to OPPT for evaluation of imbalance, falls, gait abnormality s/p CVA in 2014.  Pt was not able to participate in therapy after CVA due to lack of insurance.  Pt currently uses SPC at Mod I level but reporting R wrist pain and swelling.   Pertinent History  HTN, DM, CVA with L hemiparesis, afib, hyperlipidemia, abscess of anal and rectal areas/anal fistula, cocaine abuse in remission since 2005.     Limitations Walking   Patient Stated Goals to walk normally   Currently in Pain? Yes   Pain Score 5    Pain Location Wrist   Pain Orientation Right   Pain Descriptors / Indicators Throbbing   Pain Type Chronic pain   Pain Radiating  Towards 5th digit   Pain Onset More than a month ago   Pain Frequency Constant            [facility] PT Assessment - 04/19/17 1309      Assessment   Medical Diagnosis falls, gait abnormality   Referring Provider [name], MD   Onset Date/Surgical Date 03/23/17   Hand Dominance Right   Prior Therapy unable due to insurance     Precautions   Precautions Fall;Other (comment)   Precaution Comments assess vitals;  HTN, DM, CVA with L hemiparesis, afib, hyperlipidemia, abscess of anal and rectal areas/anal fistula, cocaine abuse in remission since 2005.       Balance Screen   Has the patient fallen in the past 6 months Yes   How many times? 12  2x/month x 6 months     [place] residence   Living Arrangements Alone   Type of [place] to enter   Entrance Stairs-Number of Steps 2   Entrance Stairs-Rails None   Home Layout Two level   Alternate Level Stairs-Number of Steps 12   Alternate Level Stairs-Rails [employer] - single point     Prior Function   Level of Independence Independent with household mobility with device;Independent with community mobility with  device;Requires assistive device for independence   Leisure yard work, gardening,      Editor, commissioning Impaired by gross assessment   Additional Comments continues to feel slightly numb in LLE     ROM / Strength   AROM / PROM / Strength Strength     Strength   Overall Strength Deficits   Overall Strength Comments RLE: 5/5, LLE: 3+/5 hip flexion, knee flexion, 4-/5 knee extension, 4/5 ankle DF     Ambulation/Gait   Ambulation/Gait Yes   Ambulation/Gait Assistance 6: Modified independent (Device/Increase time)   Ambulation Distance (Feet) 300 Feet   Assistive device Straight cane   Gait Pattern Step-through pattern;Decreased step length - right;Decreased step length - left;Decreased stance time - left;Decreased stride  length;Decreased hip/knee flexion - left;Decreased weight shift to left;Decreased trunk rotation;Poor foot clearance - left   Ambulation Surface Level;Indoor   Gait velocity 1.96 ft/sec     Standardized Balance Assessment   Standardized Balance Assessment 10 meter walk test;Five Times Sit to Stand;Berg Balance Test   Five times sit to stand comments  13.5 seconds without UE support from arm chair   10 Meter Walk 16.7 seconds or 1.96 ft/sec     Berg Balance Test   Sit to Stand Able to stand without using hands and stabilize independently   Standing Unsupported Able to stand safely 2 minutes   Sitting with Back Unsupported but Feet Supported on Floor or Stool Able to sit safely and securely 2 minutes   Stand to Sit Sits safely with minimal use of hands   Transfers Able to transfer safely, minor use of hands   Standing Unsupported with Eyes Closed Able to stand 10 seconds with supervision   Standing Ubsupported with Feet Together Able to place feet together independently and stand for 1 minute with supervision   From Standing, Reach Forward with Outstretched Arm Can reach confidently >25 cm (10")   From Standing Position, Pick up Object from [place] to pick up shoe safely and easily   From Standing Position, Turn to Look Behind Over each Shoulder Looks behind one side only/other side shows less weight shift   Turn 360 Degrees Needs close supervision or verbal cueing   Standing Unsupported, Alternately Place Feet on Step/Stool Able to stand independently and safely and complete 8 steps in 20 seconds  but LLE catches slightly on edge of step   Standing Unsupported, One Foot in Front Able to take small step independently and hold 30 seconds   Standing on One Leg Able to lift leg independently and hold 5-10 seconds   Total Score 47   Berg comment: 47/56 moderate risk for falling            Objective measurements completed on examination: See above findings.                   PT Education - 04/19/17 1411    Education provided Yes   Education Details clinical findings, PT POC and goals   Person(s) Educated Patient   Methods Explanation   Comprehension Verbalized understanding          PT Short Term Goals - 04/19/17 1419      PT SHORT TERM GOAL #1   Title Pt will participate in further gait assessment with DGI   Baseline not assessed to date   Time 4   Period Weeks   Status New   Target Date 05/19/17     PT SHORT TERM  GOAL #2   Title Pt will improve LE strength as indicated by decrease in five time sit to stand from arm chair by 4 seconds   Baseline 13.5 seconds    Time 4   Period Weeks   Status New   Target Date 05/19/17     PT SHORT TERM GOAL #3   Title Pt will improve safety with ambulation in community as indicated by increase in gait velocity to >2.6 ft/sec with LRAD   Baseline 1.96 ft/sec with [facility]   Time 4   Period Weeks   Status New   Target Date 05/19/17     PT SHORT TERM GOAL #4   Title Pt will decrease falls risk as indicated by increase in Berg score to > or = 53/56   Baseline 47/56   Time 4   Period Weeks   Status New   Target Date 05/19/17     PT SHORT TERM GOAL #5   Title Pt will report <2 falls during first month of therapy   Baseline 2 per month   Time 4   Period Weeks   Status New   Target Date 05/19/17           PT Long Term Goals - 04/19/17 1424      PT LONG TERM GOAL #1   Title Pt will be independent with standing balance/strength HEP and will attend [employer] 1-2x/week   Baseline not attending to date, dependent for HEP currently   Time 8   Period Weeks   Status New   Target Date 06/18/17     PT LONG TERM GOAL #2   Title Pt will improve safety with gait in community as indicated by increase in DGI score by 4 points   Baseline not assessed to date   Time 8   Period Weeks   Status New   Target Date 06/18/17     PT LONG TERM GOAL #3   Title Pt will decrease falls risk during  community ambulation as indicated by increase in gait velocity to >3.0 ft/sec with LRAD   Baseline 1.96 ft/sec with SPC   Time 8   Period Weeks   Status New   Target Date 06/18/17     PT LONG TERM GOAL #4   Title Pt will demonstrate improvement in LLE strength by 1 muscle grade   Baseline 3+/5 hip flexion, 3+/5 knee flexion, 4-/5 knee extension, 4/5 ankle DF   Time 8   Period Weeks   Status New   Target Date 06/18/17     PT LONG TERM GOAL #5   Title Pt will ambulate x 1000 over uneven outdoor surfaces with LRAD and negotiate 12 stairs with alternating sequence MOD I and no evidence of L foot drag   Baseline SPC with intermittent L foot drag/trips over L foot   Time 8   Period Weeks   Status New   Target Date 06/18/17                Plan - 04/19/17 1412    Clinical Impression Statement Pt is a 66 year old male presenting to [facility] neuro for evaluation of falls and gait abnormality as a result of chronic L hemiparesis s/p CVA in 2014.  Pt's PMH significant for the following: HTN, DM, CVA with L hemiparesis, afib, hyperlipidemia, abscess of anal and rectal areas/anal fistula, cocaine abuse in remission since 2005.  Pt was not able to receive therapy after CVA due  to lack of insurance but has been using treadmill-but fell on it, exercise bike, performs yard work, gardens, and recently enrolled in the [employer].  Pt is Mod I with cane but reports multiple falls and R wrist pain.  The following deficits were noted during pt's exam: L hemiparesis with LE weakness, impaired functional endurance LLE, impaired balance and gait.  Pt's gait speed indicates pt is safe for limited community ambulation but is below normal speed for independent community ambulation.  Pt's BERG and five time sit to stand score indicates pt is at moderate risk for falls. Pt would benefit from skilled PT to address these impairments and functional limitations to maximize functional mobility independence and  reduce falls risk.   History and Personal Factors relevant to plan of care:  HTN, DM, CVA with L hemiparesis-did not go through therapy due to lack of insurance, afib, hyperlipidemia, abscess of anal and rectal areas/anal fistula, cocaine abuse in remission since 2005, currently lives alone, does a lot of yard work, R wrist pain due to pressure on cane on R, multiple falls   Clinical Presentation Evolving   Clinical Presentation due to:  HTN, DM, CVA with L hemiparesis-did not go through therapy due to lack of insurance, afib, hyperlipidemia, abscess of anal and rectal areas/anal fistula, cocaine abuse in remission since 2005, currently lives alone, does a lot of yard work, R wrist pain due to pressure on cane on R, multiple falls   Clinical Decision Making Moderate   Rehab Potential Good   PT Frequency 2x / week   PT Duration 8 weeks   PT Treatment/Interventions ADLs/Self Care Home Management;Gait training;Stair training;Functional mobility training;Therapeutic activities;Therapeutic exercise;Balance training;Neuromuscular re-education;Patient/family education   PT Next Visit Plan Assess DGI without cane; initiate standing balance and LLE strengthening program, LLE NMR   Consulted and Agree with Plan of Care Patient      Patient will benefit from skilled therapeutic intervention in order to improve the following deficits and impairments:  Abnormal gait, Decreased balance, Decreased strength, Difficulty walking, Impaired sensation  Visit Diagnosis: Hemiplegia and hemiparesis following cerebral infarction affecting left non-dominant side (HCC)  Muscle weakness (generalized)  Repeated falls  Unsteadiness on feet  Difficulty in walking, not elsewhere classified      G-Codes - May 18, 2017 1429    Functional Assessment Tool Used (Outpatient Only) gait velocity 1.96, BERG 47/56, five time sit to stand 13.5 seconds   Functional Limitation Mobility: Walking and moving around   Mobility: Walking  and Moving Around Current Status [phone]) At least 20 percent but less than 40 percent impaired, limited or restricted   Mobility: Walking and Moving Around Goal Status [id number]) At least 1 percent but less than 20 percent impaired, limited or restricted       Problem List Patient Active Problem List   Diagnosis Date Noted  . Hyperlipemia 11/24/2016  . History of CVA with residual deficit 11/24/2016  . GERD (gastroesophageal reflux disease) 08/09/2014  . History of cocaine abuse   . Atrial fibrillation ([place])   . Hemiparesis affecting left side as late effect of stroke ([place]) 05/01/2014  . 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 DEGENERATIVE JOINT DISEASE, CERVICAL SPINE 04/24/2009  . DM2 (diabetes mellitus, type 2) ([place]) 07/03/2006  . Former smoker 07/03/2006  . HIATAL HERNIA WITH REFLUX 07/03/2006  . Essential hypertension 06/08/2006    Rico Junker, PT, DPT 18-May-2017    2:30 PM    [facility] [address]  [place], [place], [address] Phone: [phone]   Fax:  [phone]  Name: KYLIAN LOH MRN: [id number] Date of Birth: 10/25/1950

## 2017-04-20 ENCOUNTER — Ambulatory Visit (HOSPITAL_COMMUNITY)
Admission: RE | Admit: 2017-04-20 | Discharge: 2017-04-20 | Disposition: A | Discharge status: Home / Self Care | Payer: Medicare Other | Source: Ambulatory Visit | Attending: Internal Medicine | Admitting: Internal Medicine

## 2017-04-20 DIAGNOSIS — M25531 Pain in right wrist: Secondary | ICD-10-CM

## 2017-04-20 DIAGNOSIS — M19031 Primary osteoarthritis, right wrist: Secondary | ICD-10-CM | POA: Diagnosis not present

## 2017-04-20 DIAGNOSIS — M189 Osteoarthritis of first carpometacarpal joint, unspecified: Secondary | ICD-10-CM | POA: Insufficient documentation

## 2017-04-23 ENCOUNTER — Ambulatory Visit: Payer: Medicare Other | Admitting: Physical Therapy

## 2017-04-23 ENCOUNTER — Telehealth: Payer: Self-pay

## 2017-04-23 MED FILL — ELIQUIS 5 MG TABLET: 5 | 30 days supply | Qty: 60 | Fill #2

## 2017-04-23 NOTE — Telephone Encounter (Signed)
Contacted pt to go over xray results. Pt states the gel is not helping. Pt is requestingsomething for pain. please f/u

## 2017-04-26 ENCOUNTER — Encounter: Payer: Self-pay | Admitting: Physical Therapy

## 2017-04-26 ENCOUNTER — Ambulatory Visit: Payer: Medicare HMO | Attending: Internal Medicine | Admitting: Physical Therapy

## 2017-04-26 VITALS — BP 149/86 | HR 86

## 2017-04-26 DIAGNOSIS — R2681 Unsteadiness on feet: Secondary | ICD-10-CM | POA: Insufficient documentation

## 2017-04-26 DIAGNOSIS — R296 Repeated falls: Secondary | ICD-10-CM | POA: Insufficient documentation

## 2017-04-26 DIAGNOSIS — M6281 Muscle weakness (generalized): Secondary | ICD-10-CM | POA: Diagnosis not present

## 2017-04-26 DIAGNOSIS — R262 Difficulty in walking, not elsewhere classified: Secondary | ICD-10-CM | POA: Diagnosis not present

## 2017-04-26 DIAGNOSIS — I69354 Hemiplegia and hemiparesis following cerebral infarction affecting left non-dominant side: Secondary | ICD-10-CM | POA: Insufficient documentation
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
## 2017-04-26 NOTE — Patient Instructions (Signed)
PRE GAIT: Marching    March forwards along counter top lifting left leg - pause, then right - pause. _4__ laps per set, _2__ sets per day.   Hold onto a support.    Feet Heel-Toe "Tandem"    One hand on counter top: walk a straight line bringing one foot directly in front of the other. Repeat for 4 laps along counter top per session. Do __2__ sessions per day.  Copyright  VHI. All rights reserved.   SINGLE LIMB STANCE    STANDING IN THE CORNER OF TWO COUNTER TOPS: Stance: single leg on floor. Raise RIGHT leg. Hold _10__ seconds. Repeat with LEFT leg. _3__ reps per SIDE, _2__ sets per day   Feet Together, Head Motion - Eyes Closed      STANDING IN CORNER OF TWO COUNTER TOPS: With eyes closed and feet together, move head slowly, up and down 10 TIMES, side to side 10 times Repeat 1 times per session. Do 2 sessions per day.    Feet Together (Compliant Surface) Head Motion - Eyes Closed    STAND IN CORNER OF TWO COUNTER TOPS.  Stand on compliant surface: pillow with feet together. Close eyes and HOLD 20-30 SECONDS. Perform 1 set, 2 times/day.

## 2017-05-07 ENCOUNTER — Ambulatory Visit: Payer: Medicare HMO | Admitting: Physical Therapy

## 2017-05-09 ENCOUNTER — Encounter (HOSPITAL_COMMUNITY): Payer: Self-pay | Admitting: *Deleted

## 2017-05-09 ENCOUNTER — Emergency Department (HOSPITAL_COMMUNITY)
Admission: EM | Admit: 2017-05-09 | Discharge: 2017-05-09 | Disposition: A | Discharge status: Home / Self Care | Payer: Medicare HMO | Attending: Emergency Medicine | Admitting: Emergency Medicine

## 2017-05-09 ENCOUNTER — Other Ambulatory Visit: Payer: Self-pay

## 2017-05-09 DIAGNOSIS — Z79899 Other long term (current) drug therapy: Secondary | ICD-10-CM | POA: Insufficient documentation

## 2017-05-09 DIAGNOSIS — E119 Type 2 diabetes mellitus without complications: Secondary | ICD-10-CM | POA: Insufficient documentation

## 2017-05-09 DIAGNOSIS — Z7984 Long term (current) use of oral hypoglycemic drugs: Secondary | ICD-10-CM | POA: Diagnosis not present

## 2017-05-09 DIAGNOSIS — L249 Irritant contact dermatitis, unspecified cause: Secondary | ICD-10-CM | POA: Insufficient documentation

## 2017-05-09 DIAGNOSIS — Z7901 Long term (current) use of anticoagulants: Secondary | ICD-10-CM | POA: Diagnosis not present

## 2017-05-09 DIAGNOSIS — Z87891 Personal history of nicotine dependence: Secondary | ICD-10-CM | POA: Diagnosis not present

## 2017-05-09 DIAGNOSIS — I10 Essential (primary) hypertension: Secondary | ICD-10-CM | POA: Diagnosis not present

## 2017-05-09 DIAGNOSIS — R21 Rash and other nonspecific skin eruption: Secondary | ICD-10-CM | POA: Diagnosis present

## 2017-05-09 MED ORDER — HYDROXYZINE HCL 10 MG PO TABS
10.0000 mg | ORAL_TABLET | Freq: Once | ORAL | Status: AC
  Administered 2017-05-09: 10 mg via ORAL
  Filled 2017-05-09: qty 1

## 2017-05-09 MED ORDER — TRIAMCINOLONE ACETONIDE 0.1 % EX CREA: 1.0000 "application " | TOPICAL_CREAM | Freq: Two times a day (BID) | CUTANEOUS | 0 refills | Status: DC

## 2017-05-09 MED ORDER — HYDROXYZINE HCL 25 MG PO TABS: 25.0000 mg | ORAL_TABLET | Freq: Four times a day (QID) | ORAL | 0 refills | Status: DC

## 2017-05-09 NOTE — ED Provider Notes (Signed)
[facility] EMERGENCY DEPARTMENT Provider Note   CSN: [id number] Arrival date & time: 05/09/17  0807     History   Chief Complaint Chief Complaint  Patient presents with  . Rash    HPI [name] is a 66 y.o. male with a h/o of DM Type II who presents to the emergency department with a chief complaint of rash.  The patient presents with a chronic pruritic rash to the upper back that began several months ago and a pruritic rash to the right upper arm that began suddenly last night.  He reports the rash has been constant since onset and has not spread.  No rash to the right forearm, left arm, face, trunk, back, or bilateral lower extremities.  The patient reports that he sleeps on his right side.  He recently changed detergents to tied with for bruits over the last few weeks.  He lives at home with his wife who does not have a similar rash.  He treated the rash with Benadryl cream with minimal improvement of the pruritus.  He denies fevers, chills, peeling of the skin, new medications, insect bites, new foods, or new environmental exposures.  The history is provided by the patient. No language interpreter was used.  Rash   This is a new problem. The current episode started yesterday. The problem has not changed since onset.The problem is associated with a new detergent/soap. There has been no fever. The rash is present on the right arm. The patient is experiencing no pain. The pain has been constant since onset. Associated symptoms include itching. Pertinent negatives include no blisters, no pain and no weeping. Treatments tried: Benadryl cream. The treatment provided mild relief.    Past Medical History:  Diagnosis Date  . Abscess of anal and rectal regions 07/15/2007   Qualifier: Diagnosis of  By: Amil Amen MD, Benjamine Mola    . Atrial fibrillation ([place])   . Diabetes mellitus without complication ([place]) Dx 7989  . FISTULA, ANAL 01/27/2007   Qualifier: Diagnosis of   By: Amil Amen MD, Benjamine Mola    . Hypertension Dx 2008  . Stroke [facility])     Patient Active Problem List   Diagnosis Date Noted  . Hyperlipemia 11/24/2016  . History of CVA with residual deficit 11/24/2016  . GERD (gastroesophageal reflux disease) 08/09/2014  . History of cocaine abuse   . Atrial fibrillation ([place])   . Hemiparesis affecting left side as late effect of stroke ([place]) 05/01/2014  . 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 DEGENERATIVE JOINT DISEASE, CERVICAL SPINE 04/24/2009  . DM2 (diabetes mellitus, type 2) ([place]) 07/03/2006  . Former smoker 07/03/2006  . HIATAL HERNIA WITH REFLUX 07/03/2006  . Essential hypertension 06/08/2006    History reviewed. No pertinent surgical history.     Home Medications    Prior to Admission medications   Medication Sig Start Date End Date Taking? Authorizing Provider  amLODipine (NORVASC) 10 MG tablet Take 1 tablet (10 mg total) by mouth daily. 11/24/16   Funches, Adriana Mccallum, MD  apixaban (ELIQUIS) 5 MG TABS tablet TAKE ONE TABLET 2 TIMES A DAY 09/30/16   Funches, Adriana Mccallum, MD  Blood Glucose Monitoring Suppl (TRUE METRIX METER) w/Device KIT Use as directed 05/11/16   Boykin Nearing, MD  diclofenac sodium (VOLTAREN) 1 % GEL Apply 2 g topically 4 (four) times daily. 03/23/17   [name], MD  GlucoCom Lancets MISC 100 Units by Does not apply route once. 03/13/15   Boykin Nearing, MD  glucose  blood (TRUE METRIX BLOOD GLUCOSE TEST) test strip Use as instructed 05/11/16   Boykin Nearing, MD  hydrOXYzine (ATARAX/VISTARIL) 25 MG tablet Take 1 tablet (25 mg total) every 6 (six) hours by mouth. 05/09/17   Kalilah Barua A, PA-C  metFORMIN (GLUCOPHAGE) 500 MG tablet Take 0.5 tablets (250 mg total) by mouth daily with breakfast. 03/23/17   [name], MD  Multiple Vitamins-Minerals (MENS MULTIVITAMIN PLUS PO) Take 1 tablet by mouth daily.    [provider]  pravastatin (PRAVACHOL) 10 MG tablet Take 1 tablet (10 mg total) by  mouth daily. 11/26/16   Funches, Adriana Mccallum, MD  ranitidine (ZANTAC) 150 MG tablet Take 1 tablet (150 mg total) by mouth 2 (two) times daily. 11/24/16   Funches, Adriana Mccallum, MD  sildenafil (VIAGRA) 100 MG tablet Take 0.5-1 tablets (50-100 mg total) by mouth daily as needed for erectile dysfunction. 11/24/16   Funches, Adriana Mccallum, MD  triamcinolone cream (KENALOG) 0.1 % Apply 1 application 2 (two) times daily topically. 05/09/17   Ed Rayson, Laymond Purser, PA-C    Family History Family History  Problem Relation Age of Onset  . Diabetes Mother   . Diabetes Father     Social History Social History   Tobacco Use  . Smoking status: Former Smoker    Types: Cigarettes    Last attempt to quit: 05/25/2014    Years since quitting: 2.9  . Smokeless tobacco: Never Used  . Tobacco comment: Quit 05/15/14  Substance Use Topics  . Alcohol use: No    Alcohol/week: 0.0 oz  . Drug use: No     Allergies   Lisinopril   Review of Systems Review of Systems  Constitutional: Negative for chills and fever.  Skin: Positive for itching and rash.  Allergic/Immunologic: Positive for immunocompromised state.     Physical Exam Updated Vital Signs BP (!) 131/101 (BP Location: Left Arm)   Pulse 71   Temp 98.8 F (37.1 C) (Oral)   Resp 16   Ht 6' (1.829 m)   Wt 88 kg (194 lb)   SpO2 100%   BMI 26.31 kg/m   Physical Exam  Constitutional: He appears well-developed.  HENT:  Head: Normocephalic.  Eyes: Conjunctivae are normal.  Neck: Neck supple.  Cardiovascular: Normal rate and regular rhythm.  No murmur heard. Pulmonary/Chest: Effort normal. No respiratory distress.  Abdominal: Soft. He exhibits no distension.  Neurological: He is alert.  Skin: Skin is warm and dry. Rash noted.  A large erythematous plaque with overlying xeroderma is noted to the upper back.  No pustules, bulla, vesicles, or scaling.  No fluctuance or induration. it is not dermatomal; it crosses the midline.  No desquamation.  On the right  upper arm, there are <10 1-2m, circular maculopapular lesions, evenly dispersed.  No pustules, vesicles, bulla, scaling.   Psychiatric: His behavior is normal.  Nursing note and vitals reviewed.    ED Treatments / Results  Labs (all labs ordered are listed, but only abnormal results are displayed) Labs Reviewed - No data to display  EKG  EKG Interpretation None       Radiology No results found.  Procedures Procedures (including critical care time)  Medications Ordered in ED Medications  hydrOXYzine (ATARAX/VISTARIL) tablet 10 mg (not administered)     Initial Impression / Assessment and Plan / ED Course  I have reviewed the triage vital signs and the nursing notes.  Pertinent labs & imaging results that were available during my care of the patient were reviewed by  me and considered in my medical decision making (see chart for details).     66 year old male with a history of type 2 diabetes mellitus presenting with a chronic rash to the upper back and an acute rash to the right upper arm.  The patient was discussed with Dr. Jeanell Sparrow, attending physician.  He has treated his symptoms at home with Benadryl cream with mild improvement of the pruritus.  He is not established with dermatology. Rash consistent with contact dermatitis. Patient denies any difficulty breathing or swallowing.  Pt has a patent airway without stridor and is handling secretions without difficulty; no angioedema. No blisters, no pustules, no warmth, no draining sinus tracts, no superficial abscesses, no bullous impetigo, no vesicles, no desquamation, no target lesions with dusky purpura or a central bulla. Not tender to touch. No concern for superimposed infection. No concern for SJS, TEN, TSS, tick borne illness, syphilis or other life-threatening condition. Will discharge home with Atarax, triamcinolone cream, and follow-up to dermatology if symptoms do not improve.  Strict return precautions given.  No acute  distress.  The patient is safe for discharge at this time.  Final Clinical Impressions(s) / ED Diagnoses   Final diagnoses:  Irritant contact dermatitis, unspecified trigger    ED Discharge Orders        Ordered    hydrOXYzine (ATARAX/VISTARIL) 25 MG tablet  Every 6 hours     05/09/17 0929    triamcinolone cream (KENALOG) 0.1 %  2 times daily     05/09/17 0929       Joline Maxcy A, PA-C 05/09/17 3143    Pattricia Boss, MD 05/09/17 1018

## 2017-05-09 NOTE — ED Triage Notes (Signed)
Pt states chronic pruritic rash to back and rash to R arm since last night.

## 2017-05-09 NOTE — ED Notes (Signed)
Declined W/C at D/C and was escorted to lobby by RN. 

## 2017-05-09 NOTE — Discharge Instructions (Signed)
Apply triamcinolone cream to the rash up to 2 times daily.  Please stop using this medication when the rash improves because long-term use can cause thinning of the skin.  You can take 1 tablet of Atarax once every 6 hours as needed for itching.  Please use caution when taking this medication in addition to Benadryl because both of these medications can make you drowsy.   If the rash does not start to improve in the next 3-5 days, please call and schedule follow-up appointment with dermatology.  I have listed the names of several dermatology practices in the area along with your discharge paperwork.  We do not have a specific dermatologist from the emergency department to refer you to.  If you develop any new or worsening symptoms, including fevers, chills, if there is a significant change in the rash, shortness of breath, or other new concerning symptoms, please return to the emergency department for reevaluation.

## 2017-05-18 ENCOUNTER — Encounter: Payer: Self-pay | Admitting: Physical Therapy

## 2017-05-18 ENCOUNTER — Ambulatory Visit: Payer: Medicare HMO | Admitting: Physical Therapy

## 2017-05-18 DIAGNOSIS — R296 Repeated falls: Secondary | ICD-10-CM

## 2017-05-18 DIAGNOSIS — R262 Difficulty in walking, not elsewhere classified: Secondary | ICD-10-CM

## 2017-05-18 DIAGNOSIS — R2681 Unsteadiness on feet: Secondary | ICD-10-CM

## 2017-05-18 DIAGNOSIS — I69354 Hemiplegia and hemiparesis following cerebral infarction affecting left non-dominant side: Secondary | ICD-10-CM

## 2017-05-18 DIAGNOSIS — M6281 Muscle weakness (generalized): Secondary | ICD-10-CM

## 2017-05-18 NOTE — Therapy (Signed)
[facility] [address] [place], [place], [address] Phone: [phone]   Fax:  [phone]  Physical Therapy Treatment  Patient Details  Name: [name] MRN: [id number] Date of Birth: Jan 07, 1951 Referring Provider: [name], MD   Encounter Date: 05/18/2017  PT End of Session - 05/18/17 1627    Visit Number  3    Number of Visits  17    Date for PT Re-Evaluation  06/18/17    Authorization Type  Medicare primary, Medicaid secondary; G code and PN every 10th visit    PT Start Time  1320    PT Stop Time  1404    PT Time Calculation (min)  44 min    Activity Tolerance  Patient limited by fatigue    Behavior During Therapy  [facility] for tasks assessed/performed       Past Medical History:  Diagnosis Date  . Abscess of anal and rectal regions 07/15/2007   Qualifier: Diagnosis of  By: Amil Amen MD, Benjamine Mola    . Atrial fibrillation ([place])   . Diabetes mellitus without complication ([place]) Dx 3536  . FISTULA, ANAL 01/27/2007   Qualifier: Diagnosis of  By: Amil Amen MD, Benjamine Mola    . Hypertension Dx 2008  . Stroke [facility])     History reviewed. No pertinent surgical history.  There were no vitals filed for this visit.  Subjective Assessment - 05/18/17 1324    Subjective  Wrist pain is about the same, Voltaren helps a little.  Has not been able to perform HEP consistently due to wife falling down the stairs and has a LE fracture and pt has been taking care of the house and 32 year old grandson.      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      Limitations  Walking    Patient Stated Goals  to walk normally    Currently in Pain?  Yes    Pain Score  7     Pain Location  Wrist    Pain Orientation  Right    Pain Descriptors / Indicators  Throbbing    Pain Onset  More than a month ago                            Balance Exercises - 05/18/17 1345      Balance Exercises: Standing   SLS  Eyes open;Solid surface;Upper extremity support 1;Upper extremity support 2;10 secs 4 reps, added in head turns/nods x 8 reps    Tandem Gait  Forward;Intermittent upper extremity support;Foam/compliant surface solid and compliant blue balance beam     Step Over Hurdles / Cones  forwards slowly then faster alternating hurdles x 4 laps, retro x 4 laps and R/L laterally x 4 laps with intermittent UE support and min A    Marching Limitations  in // bars x 4 laps, no UE support    Other Standing Exercises  Braiding to L and R along blue balance beam x 2 reps each direction with intermittent UE support; tandem taps: one foot stationary on blue balance beam and contralateral leg tapping forwards and back in tandem x 10 reps each side.          PT Short Term Goals - 04/26/17 1455      PT SHORT TERM GOAL #1  Title  Pt will participate in further gait assessment with DGI    Baseline  Assessed FGA on 11/5    Time  4    Period  Weeks    Status  Achieved      PT SHORT TERM GOAL #2   Title  Pt will improve LE strength as indicated by decrease in five time sit to stand from arm chair by 4 seconds    Baseline  13.5 seconds     Time  4    Period  Weeks    Status  New    Target Date  05/19/17      PT SHORT TERM GOAL #3   Title  Pt will improve safety with ambulation in community as indicated by increase in gait velocity to >2.6 ft/sec with LRAD    Baseline  1.96 ft/sec with [facility]    Time  4    Period  Weeks    Status  New    Target Date  05/19/17      PT SHORT TERM GOAL #4   Title  Pt will decrease falls risk as indicated by increase in Berg score to > or = 53/56    Baseline  47/56    Time  4    Period  Weeks    Status  New    Target Date  05/19/17      PT SHORT TERM GOAL #5   Title  Pt will report <2 falls during first month of therapy    Baseline  2 per month     Time  4    Period  Weeks    Status  New    Target Date  05/19/17        PT Long Term Goals - 04/26/17 1504      PT LONG TERM GOAL #1   Title  Pt will be independent with standing balance/strength HEP and will attend [employer] 1-2x/week    Baseline  not attending to date, dependent for HEP currently    Time  8    Period  Weeks    Status  New    Target Date  06/18/17      PT LONG TERM GOAL #2   Title  Pt will improve safety with gait in community as indicated by increase in FGA score by 8 points    Baseline  16/30 with out cane    Time  8    Period  Weeks    Status  Revised    Target Date  06/18/17      PT LONG TERM GOAL #3   Title  Pt will decrease falls risk during community ambulation as indicated by increase in gait velocity to >3.0 ft/sec with LRAD    Baseline  1.96 ft/sec with SPC    Time  8    Period  Weeks    Status  New    Target Date  06/18/17      PT LONG TERM GOAL #4   Title  Pt will demonstrate improvement in LLE strength by 1 muscle grade    Baseline  3+/5 hip flexion, 3+/5 knee flexion, 4-/5 knee extension, 4/5 ankle DF    Time  8    Period  Weeks    Status  New    Target Date  06/18/17      PT LONG TERM GOAL #5   Title  Pt will ambulate x 1000 over uneven outdoor surfaces with  LRAD and negotiate 12 stairs with alternating sequence MOD I and no evidence of L foot drag    Baseline  SPC with intermittent L foot drag/trips over L foot    Time  8    Period  Weeks    Status  New    Target Date  06/18/17            Plan - 05/18/17 1402    Clinical Impression Statement  Pt requesting to perform "general balance treatment" today due to significant fatigue from caring full time for wife and grandson; pt has not had many opportunities to perform HEP and would like to hold on treadmill walking program for now.  Focused session on dynamic standing balance including SLS stability when stepping over obstacles, balance during more narrow BOS, balance  on compliant surfaces, with head turns and for balance reactions.  Pt tolerated well but did fatigue quickly.  Pt asking if in next session therapy could assess if he would be safe enough to walk around his yard with a backpack leaf blower; will assess next session as well as progress towards STG.    Rehab Potential  Good    PT Frequency  2x / week    PT Duration  8 weeks    PT Treatment/Interventions  ADLs/Self Care Home Management;Gait training;Stair training;Functional mobility training;Therapeutic activities;Therapeutic exercise;Balance training;Neuromuscular re-education;Patient/family education    PT Next Visit Plan  next session: check STG, go outside and walk over grass with backpack leafblower!  Treadmill walking program for home when appropriate    Consulted and Agree with Plan of Care  Patient       Patient will benefit from skilled therapeutic intervention in order to improve the following deficits and impairments:  Abnormal gait, Decreased balance, Decreased strength, Difficulty walking, Impaired sensation  Visit Diagnosis: Hemiplegia and hemiparesis following cerebral infarction affecting left non-dominant side (HCC)  Muscle weakness (generalized)  Repeated falls  Unsteadiness on feet  Difficulty in walking, not elsewhere classified     Problem List Patient Active Problem List   Diagnosis Date Noted  . Hyperlipemia 11/24/2016  . History of CVA with residual deficit 11/24/2016  . GERD (gastroesophageal reflux disease) 08/09/2014  . History of cocaine abuse   . Atrial fibrillation ([place])   . Hemiparesis affecting left side as late effect of stroke ([place]) 05/01/2014  . 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 DEGENERATIVE JOINT DISEASE, CERVICAL SPINE 04/24/2009  . DM2 (diabetes mellitus, type 2) ([place]) 07/03/2006  . Former smoker 07/03/2006  . HIATAL HERNIA WITH REFLUX 07/03/2006  . Essential hypertension 06/08/2006    Rico Junker, PT, DPT 05/18/17    4:38  PM    [facility] [address] [place], [place], [address] Phone: [phone]   Fax:  [phone]  Name: [name] MRN: [id number] Date of Birth: December 13, 1950

## 2017-05-20 ENCOUNTER — Ambulatory Visit: Payer: Medicare Other | Admitting: Physical Therapy

## 2017-05-21 ENCOUNTER — Encounter: Payer: Self-pay | Admitting: Physical Therapy

## 2017-05-21 ENCOUNTER — Ambulatory Visit: Payer: Medicare HMO | Admitting: Physical Therapy

## 2017-05-21 DIAGNOSIS — M6281 Muscle weakness (generalized): Secondary | ICD-10-CM

## 2017-05-21 DIAGNOSIS — R262 Difficulty in walking, not elsewhere classified: Secondary | ICD-10-CM | POA: Diagnosis not present

## 2017-05-21 DIAGNOSIS — R296 Repeated falls: Secondary | ICD-10-CM | POA: Diagnosis not present

## 2017-05-21 DIAGNOSIS — I69354 Hemiplegia and hemiparesis following cerebral infarction affecting left non-dominant side: Secondary | ICD-10-CM

## 2017-05-21 DIAGNOSIS — R2681 Unsteadiness on feet: Secondary | ICD-10-CM | POA: Diagnosis not present

## 2017-05-21 NOTE — Therapy (Signed)
[facility] [address] [place], , [address] Phone: [phone]   Fax:  [phone]  Physical Therapy Treatment  Patient Details  Name: [name] MRN: [id number] Date of Birth: 05/27/1951 Referring Provider: Deborah B Johnson, MD   Encounter Date: 05/21/2017  PT End of Session - 05/21/17 1147    Visit Number  4    Number of Visits  17    Date for PT Re-Evaluation  06/18/17    Authorization Type  Medicare primary, Medicaid secondary; G code and PN every 10th visit    PT Start Time  1106    PT Stop Time  1143    PT Time Calculation (min)  37 min    Activity Tolerance  Patient limited by fatigue    Behavior During Therapy  WFL for tasks assessed/performed       Past Medical History:  Diagnosis Date  . Abscess of anal and rectal regions 07/15/2007   Qualifier: Diagnosis of  By: Mulberry MD, Elizabeth    . Atrial fibrillation (HCC)   . Diabetes mellitus without complication (HCC) Dx 2008  . FISTULA, ANAL 01/27/2007   Qualifier: Diagnosis of  By: Mulberry MD, Elizabeth    . Hypertension Dx 2008  . Stroke (HCC)     History reviewed. No pertinent surgical history.  There were no vitals filed for this visit.  Subjective Assessment - 05/21/17 1111    Subjective  Pt still not getting much rest; wakes up itching.  Discussed changing laundry detergents to one that is free of irritants.      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      Limitations  Walking    Patient Stated Goals  to walk normally    Currently in Pain?  Yes    Pain Score  4     Pain Location  Wrist    Pain Orientation  Right    Pain Descriptors / Indicators  Throbbing    Pain Onset  More than a month ago         [facility] PT Assessment - 05/21/17 1125      Standardized Balance Assessment   Five times sit to stand  comments   9.88 seconds without UE support from arm chair    10 Meter Walk  11.56 seconds without AD or 2.8 ft/sec, 10.8 seconds with AD or 3.0 ft/sec      Berg Balance Test   Sit to Stand  Able to stand without using hands and stabilize independently    Standing Unsupported  Able to stand safely 2 minutes    Sitting with Back Unsupported but Feet Supported on Floor or Stool  Able to sit safely and securely 2 minutes    Stand to Sit  Sits safely with minimal use of hands    Transfers  Able to transfer safely, minor use of hands    Standing Unsupported with Eyes Closed  Able to stand 10 seconds safely    Standing Ubsupported with Feet Together  Able to place feet together independently and stand 1 minute safely    From Standing, Reach Forward with Outstretched Arm  Can reach forward >12 cm safely (5")    From Standing Position, Pick up Object from Floor  Able to pick up shoe safely and easily    From Standing Position, Turn to Look Behind Over   each Shoulder  Looks behind one side only/other side shows less weight shift    Turn 360 Degrees  Able to turn 360 degrees safely in 4 seconds or less    Standing Unsupported, Alternately Place Feet on Step/Stool  Able to stand independently and safely and complete 8 steps in 20 seconds    Standing Unsupported, One Foot in Front  Able to place foot tandem independently and hold 30 seconds    Standing on One Leg  Able to lift leg independently and hold equal to or more than 3 seconds    Total Score  52    Berg comment:  52/56 low risk for falls                       Balance Exercises - 05/21/17 1138      Balance Exercises: Standing   SLS  Eyes open;Solid surface;Upper extremity support 1;2 reps;10 secs focus on lateral weight shift and maintaining        PT Education - 05/21/17 1147    Education provided  Yes    Education Details  SLS training    Person(s) Educated  Patient    Methods  Explanation;Demonstration    Comprehension   Verbalized understanding;Returned demonstration       PT Short Term Goals - 05/21/17 1114      PT SHORT TERM GOAL #1   Title  Pt will participate in further gait assessment with DGI    Baseline  Assessed FGA on 11/5    Time  4    Period  Weeks    Status  Achieved      PT SHORT TERM GOAL #2   Title  Pt will improve LE strength as indicated by decrease in five time sit to stand from arm chair by 4 seconds    Baseline  13.5 seconds; decreased to 9.88 seconds (decreased by 3.6 seconds)    Time  4    Period  Weeks    Status  Partially Met      PT SHORT TERM GOAL #3   Title  Pt will improve safety with ambulation in community as indicated by increase in gait velocity to >2.6 ft/sec with LRAD    Baseline  1.96 ft/sec with SPC, 11.56 seconds or 2.8 ft/sec without cane, 10.87 seconds or 3.0 ft/sec    Time  4    Period  Weeks    Status  Achieved      PT SHORT TERM GOAL #4   Title  Pt will decrease falls risk as indicated by increase in Berg score to > or = 53/56    Baseline  47/56; 52/56 on 11/30    Time  4    Period  Weeks    Status  Partially Met      PT SHORT TERM GOAL #5   Title  Pt will report <2 falls during first month of therapy    Baseline  1 three weeks ago, 2 almost (down from 2/month)    Time  4    Period  Weeks    Status  Partially Met        PT Long Term Goals - 05/21/17 1153      PT LONG TERM GOAL #1   Title  Pt will be independent with standing balance/strength HEP and will attend [employer] 1-2x/week    Baseline  not attending to date, dependent for HEP currently    Time    8    Period  Weeks    Status  On-going    Target Date  06/18/17      PT LONG TERM GOAL #2   Title  Pt will improve safety with gait in community as indicated by increase in FGA score by 8 points    Baseline  16/30 with out cane    Time  8    Period  Weeks    Status  Revised    Target Date  06/18/17      PT LONG TERM GOAL #3   Title  Pt will decrease falls risk during  community ambulation as indicated by increase in gait velocity to >3.0 ft/sec with LRAD    Baseline  3.0 ft/sec with SPC    Time  8    Period  Weeks    Status  On-going    Target Date  06/18/17      PT LONG TERM GOAL #4   Title  Pt will demonstrate improvement in LLE strength by 1 muscle grade     Baseline  3+/5 hip flexion, 3+/5 knee flexion, 4-/5 knee extension, 4/5 ankle DF    Time  8    Period  Weeks    Status  On-going    Target Date  06/18/17      PT LONG TERM GOAL #5   Title  Pt will ambulate x 1000 over uneven outdoor surfaces with LRAD and negotiate 12 stairs with alternating sequence MOD I and no evidence of L foot drag    Baseline  SPC with intermittent L foot drag/trips over L foot    Time  8    Period  Weeks    Status  On-going    Target Date  06/18/17      Additional Long Term Goals   Additional Long Term Goals  Yes      PT LONG TERM GOAL #6   Title  Pt will decrease falls risk as indicated by increase in BERG balance score to >53/56    Baseline  52/56 on 11/30    Status  New    Target Date  06/18/17            Plan - 05/21/17 1148    Clinical Impression Statement  Pt continues to experience significant fatigue from taking care of wife and grandson.  Pt did not feel strong enough to practice with leaf blower outside.  Treatment session focused on assessment of progress towards STG.  Pt has met 2/5 STG and partially meeting other 3 goals.  Pt demonstrated significant improvement in gait velocity, LE strength with five time sit to stand, and with standing balance but not fully to goal level.  Pt's visits have been limited due to multiple issues at home but pt continues to perform HEP and make progress.  Pt does demonstrate lower falls risk.  Continued to focus on SLS balance training and weight shifting.  Will continue to progress towards LTG.    Rehab Potential  Good    PT Frequency  2x / week    PT Duration  8 weeks    PT Treatment/Interventions  ADLs/Self  Care Home Management;Gait training;Stair training;Functional mobility training;Therapeutic activities;Therapeutic exercise;Balance training;Neuromuscular re-education;Patient/family education    PT Next Visit Plan  go outside and walk over grass with backpack leafblower!  Treadmill walking program for home when appropriate    Consulted and Agree with Plan of Care  Patient       Patient  will benefit from skilled therapeutic intervention in order to improve the following deficits and impairments:  Abnormal gait, Decreased balance, Decreased strength, Difficulty walking, Impaired sensation  Visit Diagnosis: Hemiplegia and hemiparesis following cerebral infarction affecting left non-dominant side (HCC)  Muscle weakness (generalized)  Repeated falls  Unsteadiness on feet  Difficulty in walking, not elsewhere classified     Problem List Patient Active Problem List   Diagnosis Date Noted  . Hyperlipemia 11/24/2016  . History of CVA with residual deficit 11/24/2016  . GERD (gastroesophageal reflux disease) 08/09/2014  . History of cocaine abuse   . Atrial fibrillation (HCC)   . Hemiparesis affecting left side as late effect of stroke (HCC) 05/01/2014  . 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 DEGENERATIVE JOINT DISEASE, CERVICAL SPINE 04/24/2009  . DM2 (diabetes mellitus, type 2) (HCC) 07/03/2006  . Former smoker 07/03/2006  . HIATAL HERNIA WITH REFLUX 07/03/2006  . Essential hypertension 06/08/2006     F , PT, DPT 05/21/17    11:56 AM    [facility] [address] [place], [place], [address] Phone: [phone]   Fax:  [phone]  Name: [name] MRN: [id number] Date of Birth: 05/13/1951   

## 2017-05-22 HISTORY — PX: CYSTOSCOPY: SUR368

## 2017-05-26 ENCOUNTER — Ambulatory Visit: Payer: Medicare HMO | Admitting: Physical Therapy

## 2017-05-26 MED FILL — AMLODIPINE BESYLATE 10 MG T: 10 | 30 days supply | Qty: 30 | Fill #4

## 2017-05-28 ENCOUNTER — Ambulatory Visit: Payer: Medicare Other | Admitting: Physical Therapy

## 2017-05-31 ENCOUNTER — Ambulatory Visit: Payer: Medicare HMO | Admitting: Physical Therapy

## 2017-06-02 ENCOUNTER — Ambulatory Visit: Payer: Medicare HMO | Admitting: Physical Therapy

## 2017-06-07 ENCOUNTER — Ambulatory Visit: Payer: Medicare HMO | Admitting: Physical Therapy

## 2017-06-09 ENCOUNTER — Ambulatory Visit: Payer: Medicare Other | Admitting: Physical Therapy

## 2017-06-09 ENCOUNTER — Telehealth: Payer: Self-pay | Admitting: Physical Therapy

## 2017-06-09 NOTE — Telephone Encounter (Signed)
Contacted pt and LVM regarding multiple missed visits.  Reminded pt of next visit on 12/24 and requested pt call back to indicate if he would be able to continue with therapy at this time or needs to D/C.  If pt does not return call and misses next visit, will proceed with D/C.  Rico Junker, PT, DPT 06/09/17    10:39 AM

## 2017-06-14 ENCOUNTER — Ambulatory Visit: Payer: Medicare HMO | Attending: Internal Medicine | Admitting: Physical Therapy

## 2017-06-21 ENCOUNTER — Ambulatory Visit: Payer: Medicare HMO | Admitting: Physical Therapy

## 2017-06-22 HISTORY — PX: CATARACT EXTRACTION: SUR2

## 2017-06-23 ENCOUNTER — Ambulatory Visit: Payer: Medicare HMO | Attending: Internal Medicine | Admitting: Physical Therapy

## 2017-06-23 ENCOUNTER — Encounter: Payer: Self-pay | Admitting: Physical Therapy

## 2017-06-23 DIAGNOSIS — R2681 Unsteadiness on feet: Secondary | ICD-10-CM | POA: Insufficient documentation

## 2017-06-23 DIAGNOSIS — R296 Repeated falls: Secondary | ICD-10-CM | POA: Insufficient documentation

## 2017-06-23 DIAGNOSIS — I69354 Hemiplegia and hemiparesis following cerebral infarction affecting left non-dominant side: Secondary | ICD-10-CM | POA: Insufficient documentation

## 2017-06-23 DIAGNOSIS — M6281 Muscle weakness (generalized): Secondary | ICD-10-CM | POA: Diagnosis not present

## 2017-06-23 NOTE — Therapy (Signed)
[facility] [address] [place], [place], [address] Phone: [phone]   Fax:  [phone]  Patient Details  Name: [name] MRN: [id number] Date of Birth: December 11, 1950 Referring Provider:  No ref. provider found  Encounter Date: 06/24/17  PHYSICAL THERAPY DISCHARGE SUMMARY  Visits from Start of Care: 4  Current functional level related to goals / functional outcomes: Pt met 1/5 STG but therapist was unable to assess final functional level and LTG as pt did not return for final PT visits.   Remaining deficits: Impaired strength, balance, endurance   Education / Equipment: HEP  Plan: Patient agrees to discharge.  Patient goals were not met. Patient is being discharged due to not returning since the last visit.  ?????     G-Codes - 06-24-17 1955    Functional Assessment Tool Used (Outpatient Only)  gait velocity 1.96, BERG 47/56, five time sit to stand 13.5 seconds - unable to assess at D/C as pt did not return for final visits    Functional Limitation  Mobility: Walking and moving around    Mobility: Walking and Moving Around Goal Status [phone])  At least 1 percent but less than 20 percent impaired, limited or restricted    Mobility: Walking and Moving Around Discharge Status [phone])  At least 20 percent but less than 40 percent impaired, limited or restricted      Rico Junker, PT, DPT 06-24-17    7:57 PM   [facility] [address] [place], [place], [address] Phone: [phone]   Fax:  [phone]

## 2017-06-24 DIAGNOSIS — H25013 Cortical age-related cataract, bilateral: Secondary | ICD-10-CM | POA: Diagnosis not present

## 2017-06-24 DIAGNOSIS — H5213 Myopia, bilateral: Secondary | ICD-10-CM | POA: Diagnosis not present

## 2017-06-24 DIAGNOSIS — H40013 Open angle with borderline findings, low risk, bilateral: Secondary | ICD-10-CM | POA: Diagnosis not present

## 2017-06-24 DIAGNOSIS — E119 Type 2 diabetes mellitus without complications: Secondary | ICD-10-CM | POA: Diagnosis not present

## 2017-06-24 DIAGNOSIS — H2513 Age-related nuclear cataract, bilateral: Secondary | ICD-10-CM | POA: Diagnosis not present

## 2017-06-24 DIAGNOSIS — H524 Presbyopia: Secondary | ICD-10-CM | POA: Diagnosis not present

## 2017-06-24 MED FILL — OFLOXACIN 0.3% EYE DROPS: 0.3 | 40 days supply | Qty: 5 | Fill #0

## 2017-06-24 MED FILL — DUREZOL 0.05% EYE DROPS: 0.05 | 40 days supply | Qty: 5 | Fill #0

## 2017-06-28 ENCOUNTER — Ambulatory Visit: Payer: Medicare Other | Admitting: Physical Therapy

## 2017-06-28 MED FILL — ELIQUIS 5 MG TABLET: 5 | 30 days supply | Qty: 60 | Fill #3

## 2017-06-30 ENCOUNTER — Ambulatory Visit: Payer: Medicare Other | Admitting: Physical Therapy

## 2017-07-01 MED FILL — AMLODIPINE BESYLATE 10 MG T: 10 | 30 days supply | Qty: 30 | Fill #5

## 2017-07-06 ENCOUNTER — Encounter: Payer: Self-pay | Admitting: Internal Medicine

## 2017-07-06 ENCOUNTER — Ambulatory Visit: Payer: Medicare HMO | Attending: Internal Medicine | Admitting: Internal Medicine

## 2017-07-06 VITALS — BP 126/81 | HR 83 | Temp 97.7°F | Resp 16 | Wt 202.6 lb

## 2017-07-06 DIAGNOSIS — Z7984 Long term (current) use of oral hypoglycemic drugs: Secondary | ICD-10-CM | POA: Diagnosis not present

## 2017-07-06 DIAGNOSIS — Z87891 Personal history of nicotine dependence: Secondary | ICD-10-CM | POA: Insufficient documentation

## 2017-07-06 DIAGNOSIS — I69354 Hemiplegia and hemiparesis following cerebral infarction affecting left non-dominant side: Secondary | ICD-10-CM | POA: Insufficient documentation

## 2017-07-06 DIAGNOSIS — I480 Paroxysmal atrial fibrillation: Secondary | ICD-10-CM

## 2017-07-06 DIAGNOSIS — K449 Diaphragmatic hernia without obstruction or gangrene: Secondary | ICD-10-CM | POA: Diagnosis not present

## 2017-07-06 DIAGNOSIS — I10 Essential (primary) hypertension: Secondary | ICD-10-CM

## 2017-07-06 DIAGNOSIS — E1149 Type 2 diabetes mellitus with other diabetic neurological complication: Secondary | ICD-10-CM

## 2017-07-06 DIAGNOSIS — Z79899 Other long term (current) drug therapy: Secondary | ICD-10-CM | POA: Diagnosis not present

## 2017-07-06 DIAGNOSIS — E785 Hyperlipidemia, unspecified: Secondary | ICD-10-CM | POA: Diagnosis not present

## 2017-07-06 DIAGNOSIS — K219 Gastro-esophageal reflux disease without esophagitis: Secondary | ICD-10-CM | POA: Diagnosis not present

## 2017-07-06 DIAGNOSIS — F1411 Cocaine abuse, in remission: Secondary | ICD-10-CM | POA: Insufficient documentation

## 2017-07-06 DIAGNOSIS — N529 Male erectile dysfunction, unspecified: Secondary | ICD-10-CM | POA: Diagnosis not present

## 2017-07-06 DIAGNOSIS — L309 Dermatitis, unspecified: Secondary | ICD-10-CM | POA: Diagnosis not present

## 2017-07-06 DIAGNOSIS — Z7901 Long term (current) use of anticoagulants: Secondary | ICD-10-CM | POA: Insufficient documentation

## 2017-07-06 DIAGNOSIS — R21 Rash and other nonspecific skin eruption: Secondary | ICD-10-CM | POA: Diagnosis present

## 2017-07-06 DIAGNOSIS — Z833 Family history of diabetes mellitus: Secondary | ICD-10-CM | POA: Diagnosis not present

## 2017-07-06 DIAGNOSIS — H259 Unspecified age-related cataract: Secondary | ICD-10-CM

## 2017-07-06 DIAGNOSIS — Z888 Allergy status to other drugs, medicaments and biological substances status: Secondary | ICD-10-CM | POA: Insufficient documentation

## 2017-07-06 LAB — POCT GLYCOSYLATED HEMOGLOBIN (HGB A1C): HEMOGLOBIN A1C: 7.6

## 2017-07-06 LAB — GLUCOSE, POCT (MANUAL RESULT ENTRY): POC GLUCOSE: 150 mg/dL — AB (ref 70–99)

## 2017-07-06 MED ORDER — HYDROXYZINE HCL 25 MG PO TABS: 25.0000 mg | ORAL_TABLET | Freq: Four times a day (QID) | ORAL | 1 refills | Status: DC

## 2017-07-06 MED ORDER — TRIAMCINOLONE ACETONIDE 0.1 % EX CREA: 1.0000 "application " | TOPICAL_CREAM | Freq: Two times a day (BID) | CUTANEOUS | 0 refills | Status: DC

## 2017-07-06 MED FILL — TRIAMCINOLONE 0.1% CREAM: 0.1 | 15 days supply | Qty: 13608 | Fill #0

## 2017-07-06 MED FILL — metFORMIN HCL 500 MG TABS: 500 | 90 days supply | Qty: 45 | Fill #1

## 2017-07-06 MED FILL — hydrOXYzine HCL 25 MG TABS: 25 | 7 days supply | Qty: 30 | Fill #0

## 2017-07-06 NOTE — Progress Notes (Signed)
Patient ID: [name], male    DOB: 02/13/1951  MRN: [id number]  CC: Rash   Subjective: [name] is a 67 y.o. male who presents for chronic ds management.  Last seen 03/2017 His concerns today include:  Pt with hx of HTN, DM, CVA with Lt sided weakness 04/2014, a.fib on DOAG, HL, cocaine abuse in remission since 2005  1.  On last visit patient was referred to physical therapy due to unsteady gait and reports of falls.  He attended a few sessions.  He figured out that poor vision was contributing to his gait problem. -Saw the ophthalmologist Dr. Gershon Crane and was found to have significant bilateral cataracts. -Scheduled for cataract extraction on the left tomorrow and on the right 07/14/2017  2.  Seen in the ER back in November with complaints of itchy body rash from the groin involving the trunk and arms.  He denies having bedbugs. He has been using a scented Tide detergent which he feels may have been contributing.  Tide with [place] Citron "it tears me up."  Now using an unscented detergent.   Was given triamcinolone cream and hydroxyzine from the ER which he has found helpful.   However he continues to get flareups. Requests referral to dermatology.  3.  DM: Tolerating metformin. BS averages 100-120 Doing well with eating habits.  4.  HTN: Checking BP daily.  Reports good readings at home. Compliant with blood pressure medicines.  5. HL: He forgot to fill the prescription for the Pravachol Patient Active Problem List   Diagnosis Date Noted  . Hyperlipemia 11/24/2016  . History of CVA with residual deficit 11/24/2016  . GERD (gastroesophageal reflux disease) 08/09/2014  . History of cocaine abuse   . Atrial fibrillation ([place])   . Hemiparesis affecting left side as late effect of stroke ([place]) 05/01/2014  . 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 DEGENERATIVE JOINT DISEASE, CERVICAL SPINE 04/24/2009  . DM2 (diabetes mellitus, type 2) ([place]) 07/03/2006  . Former  smoker 07/03/2006  . HIATAL HERNIA WITH REFLUX 07/03/2006  . Essential hypertension 06/08/2006     Current Outpatient Medications on File Prior to Visit  Medication Sig Dispense Refill  . amLODipine (NORVASC) 10 MG tablet Take 1 tablet (10 mg total) by mouth daily. 30 tablet 11  . apixaban (ELIQUIS) 5 MG TABS tablet TAKE ONE TABLET 2 TIMES A DAY 180 tablet 3  . metFORMIN (GLUCOPHAGE) 500 MG tablet Take 0.5 tablets (250 mg total) by mouth daily with breakfast. 45 tablet 3  . ranitidine (ZANTAC) 150 MG tablet Take 1 tablet (150 mg total) by mouth 2 (two) times daily. 60 tablet 11  . sildenafil (VIAGRA) 100 MG tablet Take 0.5-1 tablets (50-100 mg total) by mouth daily as needed for erectile dysfunction. 2 tablet 0  . Blood Glucose Monitoring Suppl (TRUE METRIX METER) w/Device KIT Use as directed 1 kit 0  . diclofenac sodium (VOLTAREN) 1 % GEL Apply 2 g topically 4 (four) times daily. 100 g 1  . GlucoCom Lancets MISC 100 Units by Does not apply route once. 100 each 0  . glucose blood (TRUE METRIX BLOOD GLUCOSE TEST) test strip Use as instructed 100 each 12  . hydrOXYzine (ATARAX/VISTARIL) 25 MG tablet Take 1 tablet (25 mg total) every 6 (six) hours by mouth. (Patient not taking: Reported on 07/06/2017) 12 tablet 0  . Multiple Vitamins-Minerals (MENS MULTIVITAMIN PLUS PO) Take 1 tablet by mouth daily.    . pravastatin (PRAVACHOL) 10 MG tablet Take 1  tablet (10 mg total) by mouth daily. (Patient not taking: Reported on 07/06/2017) 30 tablet 5  . triamcinolone cream (KENALOG) 0.1 % Apply 1 application 2 (two) times daily topically. 30 g 0   No current facility-administered medications on file prior to visit.     Allergies  Allergen Reactions  . Lisinopril Swelling    Social History   Socioeconomic History  . Marital status: Married    Spouse name: Not on file  . Number of children: Not on file  . Years of education: Not on file  . Highest education level: Not on file  Social Needs  .  Financial resource strain: Not on file  . Food insecurity - worry: Not on file  . Food insecurity - inability: Not on file  . Transportation needs - medical: Not on file  . Transportation needs - non-medical: Not on file  Occupational History  . 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 Smoking status: Former Smoker    Types: Cigarettes    Last attempt to quit: 05/25/2014    Years since quitting: 3.1  . Smokeless tobacco: Never Used  . Tobacco comment: Quit 05/15/14  Substance and Sexual Activity  . Alcohol use: No    Alcohol/week: 0.0 oz  . Drug use: No  . Sexual activity: Not on file  Other Topics Concern  . Not on file  Social History Narrative   Lives with his wife   5 children, grown.   7 grandchildren.     Family History  Problem Relation Age of Onset  . Diabetes Mother   . Diabetes Father     No past surgical history on file.  ROS: Review of Systems Negative except as stated above PHYSICAL EXAM: BP 126/81   Pulse 83   Temp 97.7 F (36.5 C) (Oral)   Resp 16   Wt 202 lb 9.6 oz (91.9 kg)   SpO2 97%   BMI 27.48 kg/m   Physical Exam  General appearance - alert, well appearing, and in no distress Mental status - alert, oriented to person, place, and time, normal mood, behavior, speech, dress, motor activity, and thought processes Neck - supple, no significant adenopathy Chest - clear to auscultation, no wheezes, rales or rhonchi, symmetric air entry Heart - normal rate, regular rhythm, normal S1, S2, no murmurs, rubs, clicks or gallops Extremities - peripheral pulses normal, no pedal edema, no clubbing or cyanosis Skin -mild hyperpigmented excoriation at the right wrist  Results for orders placed or performed in visit on 03/23/17  POCT A1C  Result Value Ref Range   Hemoglobin A1C 7.1   Glucose (CBG)  Result Value Ref Range   POC Glucose 163 (A) 70 - 99 mg/dl   BS 150.  BS 7.6  ASSESSMENT AND PLAN: 1. Type  2 diabetes mellitus with other neurologic complication, unspecified whether long term insulin use (HCC) Close to goal.  Continue metformin. - POCT glucose (manual entry) - POCT glycosylated hemoglobin (Hb A1C)  2. Essential hypertension At goal.  3. Paroxysmal atrial fibrillation (HCC) On Eliquis.  No reported bleeding episodes.  4. Hyperlipidemia, unspecified hyperlipidemia type Patient to fill prescription for Pravachol.  5. Dermatitis - Ambulatory referral to Dermatology - triamcinolone cream (KENALOG) 0.1 %; Apply 1 application topically 2 (two) times daily.  Dispense: 30 g; Refill: 0 - hydrOXYzine (ATARAX/VISTARIL) 25 MG tablet; Take 1 tablet (25 mg total) by mouth every 6 (six) hours.  Dispense:  30 tablet; Refill: 1  6. Age-related cataract of both eyes, unspecified age-related cataract type Patient scheduled for left cataract extraction tomorrow.  I wanted to make sure that Dr. Gershon Crane did not want him to hold the Eliquis for 2 days.  I tried to reach out to his office but got a voicemail.  We were able to speak with a nurse at the surgical center where he is scheduled for tomorrow.  She says that the ophthalmologist usually does not have them hold anticoagulants for cataract surgery.  I told patient to mention it to Dr. Gershon Crane tomorrow morning just to make sure that that indeed is the consensus  Patient was given the opportunity to ask questions.  Patient verbalized understanding of the plan and was able to repeat key elements of the plan.   No orders of the defined types were placed in this encounter.    Requested Prescriptions    No prescriptions requested or ordered in this encounter    F/u in 3 mths Karle Plumber, MD, Rosalita Chessman

## 2017-07-06 NOTE — Progress Notes (Signed)
PT states he has been having a rash break for 3-4 months

## 2017-07-07 DIAGNOSIS — H25012 Cortical age-related cataract, left eye: Secondary | ICD-10-CM | POA: Diagnosis not present

## 2017-07-07 DIAGNOSIS — H25011 Cortical age-related cataract, right eye: Secondary | ICD-10-CM | POA: Diagnosis not present

## 2017-07-07 DIAGNOSIS — H2512 Age-related nuclear cataract, left eye: Secondary | ICD-10-CM | POA: Diagnosis not present

## 2017-07-07 DIAGNOSIS — H2511 Age-related nuclear cataract, right eye: Secondary | ICD-10-CM | POA: Diagnosis not present

## 2017-07-14 DIAGNOSIS — H2511 Age-related nuclear cataract, right eye: Secondary | ICD-10-CM | POA: Diagnosis not present

## 2017-07-14 DIAGNOSIS — H25011 Cortical age-related cataract, right eye: Secondary | ICD-10-CM | POA: Diagnosis not present

## 2017-07-22 ENCOUNTER — Encounter: Payer: Self-pay | Admitting: Internal Medicine

## 2017-08-03 MED FILL — hydrOXYzine HCL 25 MG TABS: 25 | 7 days supply | Qty: 30 | Fill #1

## 2017-08-04 MED FILL — AMLODIPINE BESYLATE 10 MG T: 10 | 30 days supply | Qty: 30 | Fill #6

## 2017-09-04 ENCOUNTER — Encounter (HOSPITAL_COMMUNITY): Payer: Self-pay

## 2017-09-04 ENCOUNTER — Other Ambulatory Visit: Payer: Self-pay

## 2017-09-04 ENCOUNTER — Emergency Department (HOSPITAL_COMMUNITY): Payer: Medicare HMO

## 2017-09-04 ENCOUNTER — Emergency Department (HOSPITAL_COMMUNITY)
Admission: EM | Admit: 2017-09-04 | Discharge: 2017-09-04 | Disposition: A | Discharge status: Home / Self Care | Payer: Medicare HMO | Attending: Physician Assistant | Admitting: Physician Assistant

## 2017-09-04 DIAGNOSIS — I10 Essential (primary) hypertension: Secondary | ICD-10-CM | POA: Insufficient documentation

## 2017-09-04 DIAGNOSIS — Y999 Unspecified external cause status: Secondary | ICD-10-CM | POA: Insufficient documentation

## 2017-09-04 DIAGNOSIS — Y939 Activity, unspecified: Secondary | ICD-10-CM | POA: Diagnosis not present

## 2017-09-04 DIAGNOSIS — E119 Type 2 diabetes mellitus without complications: Secondary | ICD-10-CM | POA: Insufficient documentation

## 2017-09-04 DIAGNOSIS — Z87891 Personal history of nicotine dependence: Secondary | ICD-10-CM | POA: Insufficient documentation

## 2017-09-04 DIAGNOSIS — Y929 Unspecified place or not applicable: Secondary | ICD-10-CM | POA: Diagnosis not present

## 2017-09-04 DIAGNOSIS — G8929 Other chronic pain: Secondary | ICD-10-CM | POA: Diagnosis not present

## 2017-09-04 DIAGNOSIS — M25551 Pain in right hip: Secondary | ICD-10-CM

## 2017-09-04 DIAGNOSIS — Z7901 Long term (current) use of anticoagulants: Secondary | ICD-10-CM | POA: Insufficient documentation

## 2017-09-04 DIAGNOSIS — W19XXXA Unspecified fall, initial encounter: Secondary | ICD-10-CM | POA: Diagnosis not present

## 2017-09-04 DIAGNOSIS — M25531 Pain in right wrist: Secondary | ICD-10-CM | POA: Insufficient documentation

## 2017-09-04 DIAGNOSIS — Z7984 Long term (current) use of oral hypoglycemic drugs: Secondary | ICD-10-CM | POA: Diagnosis not present

## 2017-09-04 DIAGNOSIS — Z7902 Long term (current) use of antithrombotics/antiplatelets: Secondary | ICD-10-CM | POA: Insufficient documentation

## 2017-09-04 DIAGNOSIS — R531 Weakness: Secondary | ICD-10-CM | POA: Diagnosis not present

## 2017-09-04 MED ORDER — HYDROCODONE-ACETAMINOPHEN 5-325 MG PO TABS: 1.0000 | ORAL_TABLET | Freq: Four times a day (QID) | ORAL | 0 refills | Status: DC | PRN

## 2017-09-04 NOTE — ED Notes (Signed)
Pt states" I have the domino effect going on-- my wrist hurts, makes me unsteady with cane, fell twice onto right hip."  States wrist is throbbing.

## 2017-09-04 NOTE — Discharge Instructions (Addendum)
As discussed, wear your wrist guard or ace wrap to help support your wrist. Follow the rice protocol outlined in these instructions and follow up with your Primary care provider next week for re-evaluation. Take your tylenol arthritis and apply voltaren gel. Use pain medicine only for severe breakthrough pain.  Return if symptoms worsen in the meantime.

## 2017-09-04 NOTE — ED Triage Notes (Signed)
Patient complains of ongoing wrist pain that he relates to his arthritis. Takes intermittent voltaren with some relief. Also complains of right hip pain from fall due to wrist soreness from arthritis and cane usage

## 2017-09-04 NOTE — Progress Notes (Signed)
Orthopedic Tech Progress Note Patient Details:  [name] May 16, 1951 [id number]  Ortho Devices Type of Ortho Device: Thumb spica splint       [name] 09/04/2017, 10:28 AM

## 2017-09-04 NOTE — ED Provider Notes (Signed)
[facility] EMERGENCY DEPARTMENT Provider Note   CSN: [id number] Arrival date & time: 09/04/17  0815     History   Chief Complaint Chief Complaint  Patient presents with  . wrist pain/fall    HPI [name] is a 67 y.o. male with past medical history significant for arthritis, A. fib currently on Eliquis, hypertension, history of CVA with left-sided residual deficits (patient using a cane with his right hand for ambulation) presenting today with worsening right wrist pain after a fall on outstretched hand due to pain in his wrist while using his cane, also complains of right hip pain has he fell twice on the right hip.  Denies any loss of consciousness, head trauma, prodrome prior to the fall.  He states that he could not hold his weight on the right hand because of his wrist pain this is why he fell.  He has tried his Tylenol arthritis with modest relief. Patient drove himself here today.  HPI  Past Medical History:  Diagnosis Date  . Abscess of anal and rectal regions 07/15/2007   Qualifier: Diagnosis of  By: Amil Amen MD, Benjamine Mola    . Atrial fibrillation ([place])   . Diabetes mellitus without complication ([place]) Dx 4401  . FISTULA, ANAL 01/27/2007   Qualifier: Diagnosis of  By: Amil Amen MD, Benjamine Mola    . Hypertension Dx 2008  . Stroke [facility])     Patient Active Problem List   Diagnosis Date Noted  . Hyperlipemia 11/24/2016  . History of CVA with residual deficit 11/24/2016  . GERD (gastroesophageal reflux disease) 08/09/2014  . History of cocaine abuse   . Atrial fibrillation ([place])   . Hemiparesis affecting left side as late effect of stroke ([place]) 05/01/2014  . 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 DEGENERATIVE JOINT DISEASE, CERVICAL SPINE 04/24/2009  . DM2 (diabetes mellitus, type 2) ([place]) 07/03/2006  . Former smoker 07/03/2006  . HIATAL HERNIA WITH REFLUX 07/03/2006  . Essential hypertension 06/08/2006    History reviewed. No pertinent  surgical history.     Home Medications    Prior to Admission medications   Medication Sig Start Date End Date Taking? Authorizing Provider  amLODipine (NORVASC) 10 MG tablet Take 1 tablet (10 mg total) by mouth daily. 11/24/16   Funches, Adriana Mccallum, MD  apixaban (ELIQUIS) 5 MG TABS tablet TAKE ONE TABLET 2 TIMES A DAY 09/30/16   Funches, Adriana Mccallum, MD  Blood Glucose Monitoring Suppl (TRUE METRIX METER) w/Device KIT Use as directed 05/11/16   Boykin Nearing, MD  diclofenac sodium (VOLTAREN) 1 % GEL Apply 2 g topically 4 (four) times daily. 03/23/17   [name], MD  GlucoCom Lancets MISC 100 Units by Does not apply route once. 03/13/15   Funches, Adriana Mccallum, MD  glucose blood (TRUE METRIX BLOOD GLUCOSE TEST) test strip Use as instructed 05/11/16   Boykin Nearing, MD  HYDROcodone-acetaminophen (NORCO/VICODIN) 5-325 MG tablet Take 1 tablet by mouth every 6 (six) hours as needed for severe pain. 09/04/17   Avie Echevaria B, PA-C  hydrOXYzine (ATARAX/VISTARIL) 25 MG tablet Take 1 tablet (25 mg total) by mouth every 6 (six) hours. 07/06/17   [name], MD  metFORMIN (GLUCOPHAGE) 500 MG tablet Take 0.5 tablets (250 mg total) by mouth daily with breakfast. 03/23/17   [name], MD  Multiple Vitamins-Minerals (MENS MULTIVITAMIN PLUS PO) Take 1 tablet by mouth daily.    [provider]  pravastatin (PRAVACHOL) 10 MG tablet Take 1 tablet (10 mg total) by  mouth daily. Patient not taking: Reported on 07/06/2017 11/26/16   Boykin Nearing, MD  ranitidine (ZANTAC) 150 MG tablet Take 1 tablet (150 mg total) by mouth 2 (two) times daily. 11/24/16   Funches, Adriana Mccallum, MD  sildenafil (VIAGRA) 100 MG tablet Take 0.5-1 tablets (50-100 mg total) by mouth daily as needed for erectile dysfunction. 11/24/16   Funches, Adriana Mccallum, MD  triamcinolone cream (KENALOG) 0.1 % Apply 1 application topically 2 (two) times daily. 07/06/17   [name], MD    Family History Family History  Problem  Relation Age of Onset  . Diabetes Mother   . Diabetes Father     Social History Social History   Tobacco Use  . Smoking status: Former Smoker    Types: Cigarettes    Last attempt to quit: 05/25/2014    Years since quitting: 3.2  . Smokeless tobacco: Never Used  . Tobacco comment: Quit 05/15/14  Substance Use Topics  . Alcohol use: No    Alcohol/week: 0.0 oz  . Drug use: No     Allergies   Lisinopril   Review of Systems Review of Systems  Constitutional: Negative for chills, diaphoresis and fever.  Eyes: Negative for visual disturbance.  Respiratory: Negative for cough, choking, chest tightness, shortness of breath, wheezing and stridor.   Cardiovascular: Negative for chest pain, palpitations and leg swelling.  Gastrointestinal: Negative for abdominal pain, nausea and vomiting.  Genitourinary: Negative for dysuria and hematuria.  Musculoskeletal: Positive for arthralgias and joint swelling. Negative for back pain, myalgias, neck pain and neck stiffness.  Skin: Negative for color change, pallor, rash and wound.  Neurological: Positive for weakness and numbness. Negative for dizziness, seizures, syncope, light-headedness and headaches.       Patient reporting left-sided weakness at baseline and uses a cane for ambulation.  Status post CVA Also reports right hand tingling     Physical Exam Updated Vital Signs BP (!) 156/102   Pulse 67   Temp 97.9 F (36.6 C) (Oral)   Resp 18   Wt 91.6 kg (202 lb)   SpO2 99%   BMI 27.40 kg/m   Physical Exam  Constitutional: He appears well-developed and well-nourished. No distress.  Afebrile, nontoxic-appearing, sitting comfortably in chair in no acute distress.  HENT:  Head: Normocephalic and atraumatic.  Eyes: EOM are normal. Right eye exhibits no discharge. Left eye exhibits no discharge.  Neck: Normal range of motion. Neck supple.  Cardiovascular: Normal rate, regular rhythm, normal heart sounds and intact distal pulses.    No murmur heard. Pulmonary/Chest: Effort normal and breath sounds normal. No stridor. No respiratory distress. He has no wheezes. He has no rales.  Abdominal: He exhibits no distension.  Musculoskeletal: Normal range of motion. He exhibits tenderness. He exhibits no edema or deformity.  Patient with snuffbox tenderness and right hip tender to palpation.  Ecchymosis, erythema or swelling  Neurological: He is alert. No sensory deficit. He exhibits normal muscle tone.  Strength appears equal in lower extremities bilaterally very subtle weakness in the left side.   Skin: Skin is warm and dry. No rash noted. He is not diaphoretic. No erythema. No pallor.  Psychiatric: He has a normal mood and affect.  Nursing note and vitals reviewed.    ED Treatments / Results  Labs (all labs ordered are listed, but only abnormal results are displayed) Labs Reviewed - No data to display  EKG  EKG Interpretation None       Radiology Dg Wrist Complete Right  Result Date: 09/04/2017 CLINICAL DATA:  Wrist pain EXAM: RIGHT WRIST - COMPLETE 3+ VIEW COMPARISON:  04/20/2017 FINDINGS: Chronic mid scaphoid fracture with displacement is stable. There is no evidence of developing sclerosis within the bony fragments. Degenerative changes involving the articulation between the scaphoid and trapezium are noted. Mild degenerative change at the base of the first metacarpal. No definite acute fracture. Chronic deformity of the fifth metacarpal. IMPRESSION: No acute bony pathology. Chronic degenerative and posttraumatic changes are noted. Electronically Signed   By: Marybelle Killings M.D.   On: 09/04/2017 09:55   Dg Hip Unilat  With Pelvis 2-3 Views Right  Result Date: 09/04/2017 CLINICAL DATA:  Right hip pain. EXAM: DG HIP (WITH OR WITHOUT PELVIS) 2-3V RIGHT COMPARISON:  10/01/2014 FINDINGS: No acute fracture. No dislocation. Unremarkable soft tissues. Advanced degenerative change of the hip joints right greater than left.  IMPRESSION: No acute bony pathology. Electronically Signed   By: Marybelle Killings M.D.   On: 09/04/2017 09:53    Procedures Procedures (including critical care time)  Medications Ordered in ED Medications - No data to display   Initial Impression / Assessment and Plan / ED Course  I have reviewed the triage vital signs and the nursing notes.  Pertinent labs & imaging results that were available during my care of the patient were reviewed by me and considered in my medical decision making (see chart for details).    Patient presenting with acute on chronic right wrist pain which has worsened after a [place]. Patient states that he has been having a hard time using his cane with his right hand and applying weight to his wrist causing him to fall and complaining of right hip pain.  No ecchymosis or hematoma.  currently on Eliquis.  Left proximal tenderness and right hip tenderness palpation on exam. Plain films without evidence of acute injury.  Will provide patient with wrist support, rice protocol and close follow-up with PCP.  Patient's last fall was a week ago, I do not suspect that there is an occult scaphoid fracture given the timeframe and negative plain films.  Discussed strict return precautions and advised to return to the emergency department if experiencing any new or worsening symptoms. Instructions were understood and patient agreed with discharge plan.  Final Clinical Impressions(s) / ED Diagnoses   Final diagnoses:  Fall, initial encounter  Wrist pain, chronic, right  Right hip pain    ED Discharge Orders        Ordered    HYDROcodone-acetaminophen (NORCO/VICODIN) 5-325 MG tablet  Every 6 hours PRN     09/04/17 1024       Emeline General, PA-C 09/04/17 1048    Mackuen, Fredia Sorrow, MD 09/05/17 1150

## 2017-09-08 MED FILL — ELIQUIS 5 MG TABLET: 5 | 30 days supply | Qty: 60 | Fill #4

## 2017-09-08 MED FILL — AMLODIPINE BESYLATE 10 MG T: 10 | 30 days supply | Qty: 30 | Fill #7

## 2017-09-09 DIAGNOSIS — H524 Presbyopia: Secondary | ICD-10-CM | POA: Diagnosis not present

## 2017-09-09 DIAGNOSIS — Z0100 Encounter for examination of eyes and vision without abnormal findings: Secondary | ICD-10-CM | POA: Diagnosis not present

## 2017-10-04 ENCOUNTER — Ambulatory Visit: Payer: Medicare Other | Admitting: Internal Medicine

## 2017-10-11 MED FILL — AMLODIPINE BESYLATE 10 MG T: 10 | 30 days supply | Qty: 30 | Fill #8

## 2017-11-04 ENCOUNTER — Other Ambulatory Visit: Payer: Self-pay

## 2017-11-04 MED ORDER — APIXABAN 5 MG PO TABS: ORAL_TABLET | ORAL | 3 refills | Status: DC

## 2017-11-08 ENCOUNTER — Encounter: Payer: Self-pay | Admitting: Internal Medicine

## 2017-11-08 ENCOUNTER — Ambulatory Visit: Payer: Medicare HMO | Attending: Internal Medicine | Admitting: Internal Medicine

## 2017-11-08 VITALS — BP 130/80 | HR 82 | Temp 98.3°F | Resp 16 | Wt 198.4 lb

## 2017-11-08 DIAGNOSIS — Z7901 Long term (current) use of anticoagulants: Secondary | ICD-10-CM | POA: Insufficient documentation

## 2017-11-08 DIAGNOSIS — I10 Essential (primary) hypertension: Secondary | ICD-10-CM | POA: Diagnosis not present

## 2017-11-08 DIAGNOSIS — Z87891 Personal history of nicotine dependence: Secondary | ICD-10-CM | POA: Diagnosis not present

## 2017-11-08 DIAGNOSIS — I4891 Unspecified atrial fibrillation: Secondary | ICD-10-CM | POA: Insufficient documentation

## 2017-11-08 DIAGNOSIS — M25531 Pain in right wrist: Secondary | ICD-10-CM

## 2017-11-08 DIAGNOSIS — E119 Type 2 diabetes mellitus without complications: Secondary | ICD-10-CM | POA: Diagnosis present

## 2017-11-08 DIAGNOSIS — Z79899 Other long term (current) drug therapy: Secondary | ICD-10-CM | POA: Insufficient documentation

## 2017-11-08 DIAGNOSIS — E1149 Type 2 diabetes mellitus with other diabetic neurological complication: Secondary | ICD-10-CM

## 2017-11-08 DIAGNOSIS — E785 Hyperlipidemia, unspecified: Secondary | ICD-10-CM | POA: Insufficient documentation

## 2017-11-08 DIAGNOSIS — Z7984 Long term (current) use of oral hypoglycemic drugs: Secondary | ICD-10-CM | POA: Insufficient documentation

## 2017-11-08 DIAGNOSIS — E1140 Type 2 diabetes mellitus with diabetic neuropathy, unspecified: Secondary | ICD-10-CM | POA: Insufficient documentation

## 2017-11-08 DIAGNOSIS — K219 Gastro-esophageal reflux disease without esophagitis: Secondary | ICD-10-CM | POA: Insufficient documentation

## 2017-11-08 DIAGNOSIS — I69354 Hemiplegia and hemiparesis following cerebral infarction affecting left non-dominant side: Secondary | ICD-10-CM | POA: Diagnosis not present

## 2017-11-08 DIAGNOSIS — N529 Male erectile dysfunction, unspecified: Secondary | ICD-10-CM | POA: Insufficient documentation

## 2017-11-08 DIAGNOSIS — Z1211 Encounter for screening for malignant neoplasm of colon: Secondary | ICD-10-CM | POA: Diagnosis not present

## 2017-11-08 DIAGNOSIS — L309 Dermatitis, unspecified: Secondary | ICD-10-CM | POA: Diagnosis not present

## 2017-11-08 DIAGNOSIS — F1411 Cocaine abuse, in remission: Secondary | ICD-10-CM | POA: Diagnosis not present

## 2017-11-08 LAB — POCT GLYCOSYLATED HEMOGLOBIN (HGB A1C): HbA1c, POC (controlled diabetic range): 7.1 % — AB (ref 0.0–7.0)

## 2017-11-08 LAB — GLUCOSE, POCT (MANUAL RESULT ENTRY): POC Glucose: 142 mg/dl — AB (ref 70–99)

## 2017-11-08 MED ORDER — HYDROXYZINE HCL 25 MG PO TABS: 25.0000 mg | ORAL_TABLET | Freq: Four times a day (QID) | ORAL | 1 refills | Status: DC

## 2017-11-08 MED FILL — ELIQUIS 5 MG TABLET: 5 | 30 days supply | Qty: 60 | Fill #0

## 2017-11-08 NOTE — Progress Notes (Signed)
Patient ID: LINDEL MARCELL, male    DOB: 1950-07-01  MRN: [id number]  CC: Diabetes and Hypertension   Subjective: Terrian Rottenberg is a 67 y.o. male who presents for chronic ds management His concerns today include:  Pt with hx of HTN, DM, CVA with Lt sided weakness 04/2014, a.fib on DOAG, HL, cocaine abusein remission since 2005  Had cataract surgery on both eyes.  Please with results.  Wears glasses now to see better close up.  Colon cancer screening:  Referral submitted in 03/2017 but never got it sought out.  Request new referral to [place]   DM:  A1C has improved from 7.6 to 7.1 Eating habits:  Cut out sugary drinks.  Drinking more water.  "I've been watching my diet."  Admits that he splurge yesterday at his pastor's anniversary   Still gets rash every now and then on arms. He found out that a neighbor whom he visits a few times a wk has bed bugs.  He has examined his bedding at nights. No bed bugs seen.  He was called by derm at [facility].  Pt declined appt.  Told them he will call back when he has a flare up  -Hydroxyzine helps.  Uses Triamcinolone cream sparingly  Seen in ER 08/2017 for RT wrist pain.  Given a wrist splint that helps.  Also given a limited supply of hydrocodone which he uses sparingly  Atrial fibrillation/HTN: He denies any bruising or bleeding with Eliquis.  No palpitations.  Or chest pain He tries to limit salt foods.  Reports compliance. Patient Active Problem List   Diagnosis Date Noted  . Hyperlipemia 11/24/2016  . History of CVA with residual deficit 11/24/2016  . GERD (gastroesophageal reflux disease) 08/09/2014  . History of cocaine abuse   . Atrial fibrillation ([place])   . Hemiparesis affecting left side as late effect of stroke ([place]) 05/01/2014  . 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 DEGENERATIVE JOINT DISEASE, CERVICAL SPINE 04/24/2009  . DM2 (diabetes mellitus, type 2) ([place]) 07/03/2006  . Former smoker 07/03/2006  . HIATAL  HERNIA WITH REFLUX 07/03/2006  . Essential hypertension 06/08/2006     Current Outpatient Medications on File Prior to Visit  Medication Sig Dispense Refill  . amLODipine (NORVASC) 10 MG tablet Take 1 tablet (10 mg total) by mouth daily. 30 tablet 11  . apixaban (ELIQUIS) 5 MG TABS tablet TAKE ONE TABLET 2 TIMES A DAY 180 tablet 3  . Blood Glucose Monitoring Suppl (TRUE METRIX METER) w/Device KIT Use as directed 1 kit 0  . diclofenac sodium (VOLTAREN) 1 % GEL Apply 2 g topically 4 (four) times daily. 100 g 1  . GlucoCom Lancets MISC 100 Units by Does not apply route once. 100 each 0  . glucose blood (TRUE METRIX BLOOD GLUCOSE TEST) test strip Use as instructed 100 each 12  . HYDROcodone-acetaminophen (NORCO/VICODIN) 5-325 MG tablet Take 1 tablet by mouth every 6 (six) hours as needed for severe pain. 8 tablet 0  . metFORMIN (GLUCOPHAGE) 500 MG tablet Take 0.5 tablets (250 mg total) by mouth daily with breakfast. 45 tablet 3  . Multiple Vitamins-Minerals (MENS MULTIVITAMIN PLUS PO) Take 1 tablet by mouth daily.    . pravastatin (PRAVACHOL) 10 MG tablet Take 1 tablet (10 mg total) by mouth daily. (Patient not taking: Reported on 07/06/2017) 30 tablet 5  . ranitidine (ZANTAC) 150 MG tablet Take 1 tablet (150 mg total) by mouth 2 (two) times daily. 60 tablet 11  .  sildenafil (VIAGRA) 100 MG tablet Take 0.5-1 tablets (50-100 mg total) by mouth daily as needed for erectile dysfunction. 2 tablet 0  . triamcinolone cream (KENALOG) 0.1 % Apply 1 application topically 2 (two) times daily. 30 g 0   No current facility-administered medications on file prior to visit.     Allergies  Allergen Reactions  . Lisinopril Swelling    Social History   Socioeconomic History  . Marital status: Married    Spouse name: Not on file  . Number of children: Not on file  . Years of education: Not on file  . Highest education level: Not on file  Occupational History  . 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 Financial resource strain: Not on file  . Food insecurity:    Worry: Not on file    Inability: Not on file  . Transportation needs:    Medical: Not on file    Non-medical: Not on file  Tobacco Use  . Smoking status: Former Smoker    Types: Cigarettes    Last attempt to quit: 05/25/2014    Years since quitting: 3.4  . Smokeless tobacco: Never Used  . Tobacco comment: Quit 05/15/14  Substance and Sexual Activity  . Alcohol use: No    Alcohol/week: 0.0 oz  . Drug use: No  . Sexual activity: Not on file  Lifestyle  . Physical activity:    Days per week: Not on file    Minutes per session: Not on file  . Stress: Not on file  Relationships  . Social connections:    Talks on phone: Not on file    Gets together: Not on file    Attends religious service: Not on file    Active member of club or organization: Not on file    Attends meetings of clubs or organizations: Not on file    Relationship status: Not on file  . Intimate partner violence:    Fear of current or ex partner: Not on file    Emotionally abused: Not on file    Physically abused: Not on file    Forced sexual activity: Not on file  Other Topics Concern  . Not on file  Social History Narrative   Lives with his wife   5 children, grown.   7 grandchildren.     Family History  Problem Relation Age of Onset  . Diabetes Mother   . Diabetes Father     Past Surgical History:  Procedure Laterality Date  . CATARACT EXTRACTION  06/2017    ROS: Review of Systems Negative except as stated above PHYSICAL EXAM: BP 130/80   Pulse 82   Temp 98.3 F (36.8 C) (Oral)   Resp 16   Wt 198 lb 6.4 oz (90 kg)   SpO2 96%   BMI 26.91 kg/m   Physical Exam General appearance - alert, well appearing, and in no distress Mental status - normal mood, behavior, speech, dress, motor activity, and thought processes Neck - supple, no significant adenopathy Chest - clear to  auscultation, no wheezes, rales or rhonchi, symmetric air entry Heart - normal rate, regular rhythm, normal S1, S2, no murmurs, rubs, clicks or gallops Extremities - peripheral pulses normal, no pedal edema, no clubbing or cyanosis Musculoskeletal: Right wrist: No edema or erythema.  Currently wearing a wrist splint which was removed for examination Results for orders placed or performed in visit on 11/08/17  POCT glucose (  manual entry)  Result Value Ref Range   POC Glucose 142 (A) 70 - 99 mg/dl  POCT glycosylated hemoglobin (Hb A1C)  Result Value Ref Range   Hemoglobin A1C  4.0 - 5.6 %   HbA1c, POC (prediabetic range)  5.7 - 6.4 %   HbA1c, POC (controlled diabetic range) 7.1 (A) 0.0 - 7.0 %    ASSESSMENT AND PLAN: 1. Type 2 diabetes mellitus with other neurologic complication, unspecified whether long term insulin use ([place]) -Patient is closer to goal.  Continue metformin.  Commended him changing his eating habits. - POCT glucose (manual entry) - POCT glycosylated hemoglobin (Hb A1C) - Microalbumin / creatinine urine ratio - CBC; Future - Comprehensive metabolic panel; Future - Lipid panel; Future  2. Dermatitis Mild excoriations noted on the upper extremities mainly the upper arms - hydrOXYzine (ATARAX/VISTARIL) 25 MG tablet; Take 1 tablet (25 mg total) by mouth every 6 (six) hours.  Dispense: 30 tablet; Refill: 1  3. Essential hypertension Goal.  Continue amlodipine  4. Colon cancer screening - Ambulatory referral to Gastroenterology  5. Right wrist pain Continue use of wrist splint.  Patient was given the opportunity to ask questions.  Patient verbalized understanding of the plan and was able to repeat key elements of the plan.   Orders Placed This Encounter  Procedures  . Microalbumin / creatinine urine ratio  . CBC  . Comprehensive metabolic panel  . Lipid panel  . Ambulatory referral to Gastroenterology  . POCT glucose (manual entry)  . POCT glycosylated  hemoglobin (Hb A1C)     Requested Prescriptions   Signed Prescriptions Disp Refills  . hydrOXYzine (ATARAX/VISTARIL) 25 MG tablet 30 tablet 1    Sig: Take 1 tablet (25 mg total) by mouth every 6 (six) hours.    Return in about 3 months (around 02/08/2018).  Karle Plumber, MD, FACP

## 2017-11-09 ENCOUNTER — Ambulatory Visit: Payer: Medicare HMO

## 2017-11-09 DIAGNOSIS — Z961 Presence of intraocular lens: Secondary | ICD-10-CM | POA: Diagnosis not present

## 2017-11-09 DIAGNOSIS — E1149 Type 2 diabetes mellitus with other diabetic neurological complication: Secondary | ICD-10-CM | POA: Diagnosis not present

## 2017-11-09 MED FILL — hydrOXYzine HCL 25 MG TABS: 25 | 7 days supply | Qty: 30 | Fill #0

## 2017-11-09 NOTE — Progress Notes (Signed)
Patient here for lab visit only 

## 2017-11-10 LAB — LIPID PANEL
CHOL/HDL RATIO: 3.7 ratio (ref 0.0–5.0)
Cholesterol, Total: 198 mg/dL (ref 100–199)
HDL: 53 mg/dL (ref >39)
LDL Calculated: 110 mg/dL — ABNORMAL HIGH (ref 0–99)
Triglycerides: 176 mg/dL — ABNORMAL HIGH (ref 0–149)
VLDL Cholesterol Cal: 35 mg/dL (ref 5–40)

## 2017-11-10 LAB — CBC
HEMATOCRIT: 40.6 % (ref 37.5–51.0)
HEMOGLOBIN: 14.1 g/dL (ref 13.0–17.7)
MCH: 31.3 pg (ref 26.6–33.0)
MCHC: 34.7 g/dL (ref 31.5–35.7)
MCV: 90 fL (ref 79–97)
Platelets: 274 10*3/uL (ref 150–450)
RBC: 4.5 x10E6/uL (ref 4.14–5.80)
RDW: 14.2 % (ref 12.3–15.4)
WBC: 5.2 10*3/uL (ref 3.4–10.8)

## 2017-11-10 LAB — COMPREHENSIVE METABOLIC PANEL
ALK PHOS: 77 IU/L (ref 39–117)
ALT: 10 IU/L (ref 0–44)
AST: 11 IU/L (ref 0–40)
Albumin/Globulin Ratio: 1.7 (ref 1.2–2.2)
Albumin: 4.5 g/dL (ref 3.6–4.8)
BILIRUBIN TOTAL: 0.3 mg/dL (ref 0.0–1.2)
BUN/Creatinine Ratio: 14 (ref 10–24)
BUN: 17 mg/dL (ref 8–27)
CHLORIDE: 102 mmol/L (ref 96–106)
CO2: 23 mmol/L (ref 20–29)
Calcium: 9.8 mg/dL (ref 8.6–10.2)
Creatinine, Ser: 1.21 mg/dL (ref 0.76–1.27)
GFR calc Af Amer: 72 mL/min/{1.73_m2} (ref >59)
GFR calc non Af Amer: 62 mL/min/{1.73_m2} (ref >59)
GLUCOSE: 163 mg/dL — AB (ref 65–99)
Globulin, Total: 2.6 g/dL (ref 1.5–4.5)
Potassium: 4.3 mmol/L (ref 3.5–5.2)
Sodium: 139 mmol/L (ref 134–144)
Total Protein: 7.1 g/dL (ref 6.0–8.5)

## 2017-11-10 LAB — MICROALBUMIN / CREATININE URINE RATIO
Creatinine, Urine: 99 mg/dL
Microalb/Creat Ratio: <3 mg/g creat (ref 0.0–30.0)

## 2017-11-11 ENCOUNTER — Telehealth: Payer: Self-pay

## 2017-11-11 NOTE — Telephone Encounter (Signed)
Contacted pt to go over lab results pt is aware and doesn't have any questions or concerns 

## 2017-11-16 MED FILL — AMLODIPINE BESYLATE 10 MG T: 10 | 30 days supply | Qty: 30 | Fill #9

## 2017-11-23 ENCOUNTER — Encounter: Payer: Self-pay | Admitting: Internal Medicine

## 2017-11-23 ENCOUNTER — Ambulatory Visit: Payer: Medicare HMO | Attending: Internal Medicine | Admitting: Internal Medicine

## 2017-11-23 VITALS — BP 144/82 | HR 83 | Temp 98.4°F | Resp 16 | Wt 198.4 lb

## 2017-11-23 DIAGNOSIS — Z79899 Other long term (current) drug therapy: Secondary | ICD-10-CM | POA: Diagnosis not present

## 2017-11-23 DIAGNOSIS — R32 Unspecified urinary incontinence: Secondary | ICD-10-CM | POA: Insufficient documentation

## 2017-11-23 DIAGNOSIS — T148XXA Other injury of unspecified body region, initial encounter: Secondary | ICD-10-CM | POA: Diagnosis not present

## 2017-11-23 DIAGNOSIS — N529 Male erectile dysfunction, unspecified: Secondary | ICD-10-CM | POA: Diagnosis not present

## 2017-11-23 DIAGNOSIS — I4891 Unspecified atrial fibrillation: Secondary | ICD-10-CM | POA: Diagnosis not present

## 2017-11-23 DIAGNOSIS — S39012A Strain of muscle, fascia and tendon of lower back, initial encounter: Secondary | ICD-10-CM | POA: Diagnosis not present

## 2017-11-23 DIAGNOSIS — Z87891 Personal history of nicotine dependence: Secondary | ICD-10-CM | POA: Diagnosis not present

## 2017-11-23 DIAGNOSIS — F1411 Cocaine abuse, in remission: Secondary | ICD-10-CM | POA: Diagnosis not present

## 2017-11-23 DIAGNOSIS — M545 Low back pain, unspecified: Secondary | ICD-10-CM

## 2017-11-23 DIAGNOSIS — Z888 Allergy status to other drugs, medicaments and biological substances status: Secondary | ICD-10-CM | POA: Diagnosis not present

## 2017-11-23 DIAGNOSIS — K219 Gastro-esophageal reflux disease without esophagitis: Secondary | ICD-10-CM | POA: Insufficient documentation

## 2017-11-23 DIAGNOSIS — E785 Hyperlipidemia, unspecified: Secondary | ICD-10-CM | POA: Insufficient documentation

## 2017-11-23 DIAGNOSIS — I10 Essential (primary) hypertension: Secondary | ICD-10-CM | POA: Diagnosis not present

## 2017-11-23 DIAGNOSIS — I69354 Hemiplegia and hemiparesis following cerebral infarction affecting left non-dominant side: Secondary | ICD-10-CM | POA: Insufficient documentation

## 2017-11-23 DIAGNOSIS — E119 Type 2 diabetes mellitus without complications: Secondary | ICD-10-CM | POA: Insufficient documentation

## 2017-11-23 DIAGNOSIS — Z7984 Long term (current) use of oral hypoglycemic drugs: Secondary | ICD-10-CM | POA: Insufficient documentation

## 2017-11-23 DIAGNOSIS — Y92410 Unspecified street and highway as the place of occurrence of the external cause: Secondary | ICD-10-CM | POA: Insufficient documentation

## 2017-11-23 MED ORDER — TRAMADOL HCL 50 MG PO TABS: 50.0000 mg | ORAL_TABLET | Freq: Three times a day (TID) | ORAL | 0 refills | Status: DC | PRN

## 2017-11-23 MED ORDER — DICLOFENAC SODIUM 1 % TD GEL: 2.0000 g | Freq: Four times a day (QID) | TRANSDERMAL | 1 refills | Status: DC

## 2017-11-23 MED ORDER — CYCLOBENZAPRINE HCL 5 MG PO TABS: 5.0000 mg | ORAL_TABLET | Freq: Every day | ORAL | 0 refills | Status: DC | PRN

## 2017-11-23 MED FILL — traMADol HCL 50 MG TABS: 50 | 5 days supply | Qty: 15 | Fill #0

## 2017-11-23 MED FILL — DICLOFENAC SODIUM 1% GEL: 1 | 12 days supply | Qty: 100 | Fill #0
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
## 2017-11-23 NOTE — Patient Instructions (Signed)
Please use a heating pad to the lower back several times a day as needed.  Use the tramadol and Flexeril as needed for pain and muscle spasm.  Both medications can cause drowsiness.

## 2017-11-25 ENCOUNTER — Ambulatory Visit: Payer: Medicare HMO

## 2017-11-26 ENCOUNTER — Telehealth: Payer: Self-pay | Admitting: Internal Medicine

## 2017-11-26 ENCOUNTER — Other Ambulatory Visit: Payer: Self-pay

## 2017-11-26 DIAGNOSIS — I10 Essential (primary) hypertension: Secondary | ICD-10-CM

## 2017-11-26 MED ORDER — BACLOFEN 5 MG PO TABS: 5.0000 mg | ORAL_TABLET | Freq: Every day | ORAL | 0 refills | Status: DC | PRN

## 2017-11-26 MED ORDER — BACLOFEN 10 MG PO TABS: ORAL_TABLET | ORAL | 0 refills | Status: DC

## 2017-11-26 MED ORDER — BACLOFEN 5 MG PO TABS: 10.0000 mg | ORAL_TABLET | Freq: Every day | ORAL | 0 refills | Status: DC | PRN

## 2017-11-26 MED FILL — BACLOFEN 10 MG TABLET: 10 | 30 days supply | Qty: 15 | Fill #0

## 2017-11-26 NOTE — Telephone Encounter (Signed)
-----   Message from Frederich Cha, CPhT sent at 11/23/2017  3:09 PM EDT ----- PA was denied for pt's flexeril, the ins. prefers tizanidine or baclofen.

## 2017-11-29 ENCOUNTER — Other Ambulatory Visit: Payer: Self-pay | Admitting: Internal Medicine

## 2017-11-29 MED ORDER — BACLOFEN 10 MG PO TABS: 5.0000 mg | ORAL_TABLET | Freq: Every day | ORAL | 0 refills | Status: DC | PRN

## 2017-12-17 ENCOUNTER — Other Ambulatory Visit: Payer: Self-pay

## 2017-12-17 DIAGNOSIS — I10 Essential (primary) hypertension: Secondary | ICD-10-CM

## 2017-12-19 MED ORDER — AMLODIPINE BESYLATE 10 MG PO TABS: 10.0000 mg | ORAL_TABLET | Freq: Every day | ORAL | 11 refills | Status: DC

## 2017-12-20 MED FILL — AMLODIPINE BESYLATE 10 MG T: 10 | 30 days supply | Qty: 30 | Fill #0

## 2017-12-21 MED FILL — hydrOXYzine HCL 25 MG TABS: 25 | 7 days supply | Qty: 30 | Fill #1

## 2017-12-24 ENCOUNTER — Other Ambulatory Visit: Payer: Self-pay

## 2017-12-24 DIAGNOSIS — E1149 Type 2 diabetes mellitus with other diabetic neurological complication: Secondary | ICD-10-CM

## 2017-12-24 DIAGNOSIS — I6930 Unspecified sequelae of cerebral infarction: Secondary | ICD-10-CM

## 2017-12-24 DIAGNOSIS — E7800 Pure hypercholesterolemia, unspecified: Secondary | ICD-10-CM

## 2017-12-24 DIAGNOSIS — I10 Essential (primary) hypertension: Secondary | ICD-10-CM

## 2017-12-25 MED ORDER — PRAVASTATIN SODIUM 10 MG PO TABS: 10.0000 mg | ORAL_TABLET | Freq: Every day | ORAL | 5 refills | Status: DC

## 2017-12-27 MED FILL — PRAVASTATIN SODIUM 10 MG TA: 10 | 30 days supply | Qty: 30 | Fill #0

## 2018-01-24 MED FILL — ELIQUIS 5 MG TABLET: 5 | 30 days supply | Qty: 60 | Fill #1

## 2018-01-25 MED FILL — AMLODIPINE BESYLATE 10 MG T: 10 | 30 days supply | Qty: 30 | Fill #1

## 2018-02-08 ENCOUNTER — Encounter: Payer: Self-pay | Admitting: Internal Medicine

## 2018-02-08 ENCOUNTER — Encounter

## 2018-02-08 ENCOUNTER — Ambulatory Visit: Payer: Medicare HMO | Attending: Internal Medicine | Admitting: Internal Medicine

## 2018-02-08 VITALS — BP 139/83 | HR 85 | Temp 98.3°F | Resp 16 | Wt 196.8 lb

## 2018-02-08 DIAGNOSIS — Z7984 Long term (current) use of oral hypoglycemic drugs: Secondary | ICD-10-CM | POA: Insufficient documentation

## 2018-02-08 DIAGNOSIS — N401 Enlarged prostate with lower urinary tract symptoms: Secondary | ICD-10-CM | POA: Diagnosis not present

## 2018-02-08 DIAGNOSIS — Z79899 Other long term (current) drug therapy: Secondary | ICD-10-CM | POA: Insufficient documentation

## 2018-02-08 DIAGNOSIS — I10 Essential (primary) hypertension: Secondary | ICD-10-CM

## 2018-02-08 DIAGNOSIS — R319 Hematuria, unspecified: Secondary | ICD-10-CM | POA: Diagnosis not present

## 2018-02-08 DIAGNOSIS — W010XXA Fall on same level from slipping, tripping and stumbling without subsequent striking against object, initial encounter: Secondary | ICD-10-CM | POA: Diagnosis not present

## 2018-02-08 DIAGNOSIS — E1149 Type 2 diabetes mellitus with other diabetic neurological complication: Secondary | ICD-10-CM | POA: Diagnosis not present

## 2018-02-08 DIAGNOSIS — E119 Type 2 diabetes mellitus without complications: Secondary | ICD-10-CM | POA: Diagnosis not present

## 2018-02-08 DIAGNOSIS — E785 Hyperlipidemia, unspecified: Secondary | ICD-10-CM | POA: Insufficient documentation

## 2018-02-08 DIAGNOSIS — Z888 Allergy status to other drugs, medicaments and biological substances status: Secondary | ICD-10-CM | POA: Insufficient documentation

## 2018-02-08 DIAGNOSIS — Z79891 Long term (current) use of opiate analgesic: Secondary | ICD-10-CM | POA: Diagnosis not present

## 2018-02-08 DIAGNOSIS — Z87891 Personal history of nicotine dependence: Secondary | ICD-10-CM | POA: Diagnosis not present

## 2018-02-08 DIAGNOSIS — W19XXXA Unspecified fall, initial encounter: Secondary | ICD-10-CM

## 2018-02-08 LAB — POCT GLYCOSYLATED HEMOGLOBIN (HGB A1C): HbA1c, POC (controlled diabetic range): 6.6 % (ref 0.0–7.0)

## 2018-02-08 LAB — GLUCOSE, POCT (MANUAL RESULT ENTRY): POC GLUCOSE: 155 mg/dL — AB (ref 70–99)
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
## 2018-03-21 ENCOUNTER — Other Ambulatory Visit: Payer: Self-pay | Admitting: Internal Medicine

## 2018-03-21 DIAGNOSIS — R319 Hematuria, unspecified: Secondary | ICD-10-CM | POA: Diagnosis not present

## 2018-03-21 MED FILL — AMLODIPINE BESYLATE 10 MG T: 10 | 30 days supply | Qty: 30 | Fill #2

## 2018-03-21 NOTE — Addendum Note (Signed)
Addended by: Octaviano Glow on: 03/21/2018 03:34 PM   Modules accepted: Orders

## 2018-03-22 ENCOUNTER — Other Ambulatory Visit: Payer: Self-pay | Admitting: Internal Medicine

## 2018-03-22 LAB — URINALYSIS
BILIRUBIN UA: NEGATIVE
KETONES UA: NEGATIVE
LEUKOCYTES UA: NEGATIVE
Nitrite, UA: NEGATIVE
Protein, UA: NEGATIVE
RBC, UA: NEGATIVE
Specific Gravity, UA: 1.018 (ref 1.005–1.030)
Urobilinogen, Ur: 1 mg/dL (ref 0.2–1.0)
pH, UA: 7 (ref 5.0–7.5)

## 2018-03-24 MED FILL — traMADol HCL 50 MG TABS: 50 | 5 days supply | Qty: 15 | Fill #0

## 2018-03-29 ENCOUNTER — Other Ambulatory Visit: Payer: Self-pay | Admitting: Internal Medicine

## 2018-03-29 DIAGNOSIS — L309 Dermatitis, unspecified: Secondary | ICD-10-CM

## 2018-03-30 MED FILL — hydrOXYzine HCL 25 MG TABS: 25 | 7 days supply | Qty: 30 | Fill #0

## 2018-04-15 MED FILL — ELIQUIS 5 MG TABLET: 5 | 30 days supply | Qty: 60 | Fill #2

## 2018-04-18 ENCOUNTER — Telehealth: Payer: Self-pay | Admitting: Internal Medicine

## 2018-04-18 MED FILL — PRAVASTATIN SODIUM 10 MG TA: 10 | 30 days supply | Qty: 30 | Fill #1

## 2018-04-18 NOTE — Telephone Encounter (Signed)
Will route to pcp 

## 2018-04-18 NOTE — Telephone Encounter (Signed)
Patient called stating that he will be having dental work done and would like to know if his PCP has any advise for him before he schedules an appointment with the dentist for extractions. Please f/u

## 2018-04-19 NOTE — Telephone Encounter (Signed)
Returned pt call with Dr. [name] response pt didn't answer. Left a detailed vm informing pt of Dr. [name] response and if he has any questions or concerns to give me a call

## 2018-04-27 DIAGNOSIS — R310 Gross hematuria: Secondary | ICD-10-CM | POA: Diagnosis not present

## 2018-05-02 MED FILL — AMLODIPINE BESYLATE 10 MG T: 10 | 30 days supply | Qty: 30 | Fill #3

## 2018-05-10 DIAGNOSIS — R310 Gross hematuria: Secondary | ICD-10-CM | POA: Diagnosis not present

## 2018-05-10 DIAGNOSIS — K449 Diaphragmatic hernia without obstruction or gangrene: Secondary | ICD-10-CM | POA: Diagnosis not present

## 2018-05-12 ENCOUNTER — Ambulatory Visit: Payer: Medicare HMO | Admitting: Internal Medicine

## 2018-05-23 MED FILL — hydrOXYzine HCL 25 MG TABS: 25 | 7 days supply | Qty: 30 | Fill #1

## 2018-05-31 DIAGNOSIS — R310 Gross hematuria: Secondary | ICD-10-CM | POA: Diagnosis not present

## 2018-06-08 DIAGNOSIS — R310 Gross hematuria: Secondary | ICD-10-CM | POA: Diagnosis not present

## 2018-06-08 DIAGNOSIS — R972 Elevated prostate specific antigen [PSA]: Secondary | ICD-10-CM | POA: Diagnosis not present

## 2018-06-08 MED FILL — AMLODIPINE BESYLATE 10 MG T: 10 | 30 days supply | Qty: 30 | Fill #4

## 2018-06-09 ENCOUNTER — Encounter: Payer: Self-pay | Admitting: Internal Medicine

## 2018-06-09 ENCOUNTER — Ambulatory Visit: Payer: Medicare HMO | Attending: Internal Medicine | Admitting: Internal Medicine

## 2018-06-09 VITALS — BP 132/82 | HR 82 | Temp 98.0°F | Resp 16 | Wt 200.6 lb

## 2018-06-09 DIAGNOSIS — K449 Diaphragmatic hernia without obstruction or gangrene: Secondary | ICD-10-CM | POA: Insufficient documentation

## 2018-06-09 DIAGNOSIS — Z91128 Patient's intentional underdosing of medication regimen for other reason: Secondary | ICD-10-CM | POA: Insufficient documentation

## 2018-06-09 DIAGNOSIS — R972 Elevated prostate specific antigen [PSA]: Secondary | ICD-10-CM

## 2018-06-09 DIAGNOSIS — Z8679 Personal history of other diseases of the circulatory system: Secondary | ICD-10-CM

## 2018-06-09 DIAGNOSIS — Z888 Allergy status to other drugs, medicaments and biological substances status: Secondary | ICD-10-CM | POA: Insufficient documentation

## 2018-06-09 DIAGNOSIS — Z23 Encounter for immunization: Secondary | ICD-10-CM | POA: Diagnosis not present

## 2018-06-09 DIAGNOSIS — Z7901 Long term (current) use of anticoagulants: Secondary | ICD-10-CM | POA: Insufficient documentation

## 2018-06-09 DIAGNOSIS — I69354 Hemiplegia and hemiparesis following cerebral infarction affecting left non-dominant side: Secondary | ICD-10-CM | POA: Diagnosis not present

## 2018-06-09 DIAGNOSIS — E785 Hyperlipidemia, unspecified: Secondary | ICD-10-CM | POA: Insufficient documentation

## 2018-06-09 DIAGNOSIS — E1149 Type 2 diabetes mellitus with other diabetic neurological complication: Secondary | ICD-10-CM | POA: Diagnosis not present

## 2018-06-09 DIAGNOSIS — I4891 Unspecified atrial fibrillation: Secondary | ICD-10-CM | POA: Insufficient documentation

## 2018-06-09 DIAGNOSIS — N529 Male erectile dysfunction, unspecified: Secondary | ICD-10-CM | POA: Insufficient documentation

## 2018-06-09 DIAGNOSIS — E119 Type 2 diabetes mellitus without complications: Secondary | ICD-10-CM | POA: Insufficient documentation

## 2018-06-09 DIAGNOSIS — K219 Gastro-esophageal reflux disease without esophagitis: Secondary | ICD-10-CM

## 2018-06-09 DIAGNOSIS — Z7984 Long term (current) use of oral hypoglycemic drugs: Secondary | ICD-10-CM | POA: Insufficient documentation

## 2018-06-09 DIAGNOSIS — I10 Essential (primary) hypertension: Secondary | ICD-10-CM | POA: Diagnosis not present

## 2018-06-09 DIAGNOSIS — T383X6A Underdosing of insulin and oral hypoglycemic [antidiabetic] drugs, initial encounter: Secondary | ICD-10-CM | POA: Insufficient documentation

## 2018-06-09 DIAGNOSIS — Z87891 Personal history of nicotine dependence: Secondary | ICD-10-CM | POA: Insufficient documentation

## 2018-06-09 DIAGNOSIS — Z79899 Other long term (current) drug therapy: Secondary | ICD-10-CM | POA: Insufficient documentation

## 2018-06-09 LAB — POCT GLYCOSYLATED HEMOGLOBIN (HGB A1C): HbA1c, POC (controlled diabetic range): 6.9 % (ref 0.0–7.0)

## 2018-06-09 LAB — GLUCOSE, POCT (MANUAL RESULT ENTRY): POC GLUCOSE: 153 mg/dL — AB (ref 70–99)

## 2018-06-09 MED ORDER — FAMOTIDINE 20 MG PO TABS: 20.0000 mg | ORAL_TABLET | Freq: Two times a day (BID) | ORAL | 4 refills | Status: DC

## 2018-06-09 NOTE — Progress Notes (Signed)
Patient ID: [name], male    DOB: 06-Oct-1950  MRN: [id number]  CC: Hypertension and Diabetes   Subjective: [name] is a 67 y.o. male who presents for chronic ds management. His concerns today include:  Pt with hx of HTN, DM, CVA with Lt sided weakness 04/2014, a.fib on DOAG, HL, cocaine abusein remission since 2005  [facility] since last visit for hematuria Reportedly had normal CT of the urinary system and normal cystoscopy l Prostate enlarge with persistent elevation of PSA of 15.  Prostate bx scheduled for 06/28/2017.  I am being asked to approve him discontinuing Eliquis for 2 days prior to the procedure.  Patient requesting change of Zantac to another medication given the recent reports that it may contain carcinogen.  He feels that he still does need to be on something for acid reflux.  HTN/A. Fib/history of CVA: Reports compliance with amlodipine and salt restriction.  He denies any chest pains or shortness of breath.  No lower extremity edema.  No palpitations.  He has not had any bruising on Eliquis.  No recent falls.  Patient went through physical therapy about a year ago for gait training and strengthening.  DM: checking BS 3 x a wk.  Gives range 90s.  He self stopped Metformin because he though it was causing constipation.  Reportedly been off of it for 6 months.  Does well with his eating habits.  Patient Active Problem List   Diagnosis Date Noted  . Hyperlipemia 11/24/2016  . History of CVA with residual deficit 11/24/2016  . GERD (gastroesophageal reflux disease) 08/09/2014  . History of cocaine abuse ([place])   . Atrial fibrillation ([place])   . Hemiparesis affecting left side as late effect of stroke ([place]) 05/01/2014  . 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 DEGENERATIVE JOINT DISEASE, CERVICAL SPINE 04/24/2009  . DM2 (diabetes mellitus, type 2) ([place]) 07/03/2006  . Former smoker 07/03/2006  . HIATAL HERNIA WITH REFLUX 07/03/2006  .  Essential hypertension 06/08/2006     Current Outpatient Medications on File Prior to Visit  Medication Sig Dispense Refill  . amLODipine (NORVASC) 10 MG tablet Take 1 tablet (10 mg total) by mouth daily. 30 tablet 11  . apixaban (ELIQUIS) 5 MG TABS tablet TAKE ONE TABLET 2 TIMES A DAY 180 tablet 3  . baclofen (LIORESAL) 10 MG tablet Take 0.5 tablets (5 mg total) by mouth daily as needed for muscle spasms. 15 each 0  . Blood Glucose Monitoring Suppl (TRUE METRIX METER) w/Device KIT Use as directed 1 kit 0  . diclofenac sodium (VOLTAREN) 1 % GEL Apply 2 g topically 4 (four) times daily. 100 g 1  . GlucoCom Lancets MISC 100 Units by Does not apply route once. 100 each 0  . glucose blood (TRUE METRIX BLOOD GLUCOSE TEST) test strip Use as instructed 100 each 12  . hydrOXYzine (ATARAX/VISTARIL) 25 MG tablet TAKE 1 TABLET (25 MG TOTAL) BY MOUTH EVERY 6 (SIX) HOURS. 30 tablet 2  . metFORMIN (GLUCOPHAGE) 500 MG tablet Take 0.5 tablets (250 mg total) by mouth daily with breakfast. 45 tablet 3  . Multiple Vitamins-Minerals (MENS MULTIVITAMIN PLUS PO) Take 1 tablet by mouth daily.    . pravastatin (PRAVACHOL) 10 MG tablet Take 1 tablet (10 mg total) by mouth daily. 30 tablet 5  . ranitidine (ZANTAC) 150 MG tablet Take 1 tablet (150 mg total) by mouth 2 (two) times daily. 60 tablet 11  . sildenafil (VIAGRA) 100 MG tablet Take  0.5-1 tablets (50-100 mg total) by mouth daily as needed for erectile dysfunction. 2 tablet 0  . traMADol (ULTRAM) 50 MG tablet TAKE 1 TABLET BY MOUTH EVERY 8 HOURS AS NEEDED FOR PAIN. 15 tablet 0  . triamcinolone cream (KENALOG) 0.1 % Apply 1 application topically 2 (two) times daily. 30 g 0   No current facility-administered medications on file prior to visit.     Allergies  Allergen Reactions  . Lisinopril Swelling    Social History   Socioeconomic History  . Marital status: Married    Spouse name: Not on file  . Number of children: Not on file  . Years of education:  Not on file  . Highest education level: Not on file  Occupational History  . 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 Financial resource strain: Not on file  . Food insecurity:    Worry: Not on file    Inability: Not on file  . Transportation needs:    Medical: Not on file    Non-medical: Not on file  Tobacco Use  . Smoking status: Former Smoker    Types: Cigarettes    Last attempt to quit: 05/25/2014    Years since quitting: 4.0  . Smokeless tobacco: Never Used  . Tobacco comment: Quit 05/15/14  Substance and Sexual Activity  . Alcohol use: No    Alcohol/week: 0.0 standard drinks  . Drug use: No  . Sexual activity: Not on file  Lifestyle  . Physical activity:    Days per week: Not on file    Minutes per session: Not on file  . Stress: Not on file  Relationships  . Social connections:    Talks on phone: Not on file    Gets together: Not on file    Attends religious service: Not on file    Active member of club or organization: Not on file    Attends meetings of clubs or organizations: Not on file    Relationship status: Not on file  . Intimate partner violence:    Fear of current or ex partner: Not on file    Emotionally abused: Not on file    Physically abused: Not on file    Forced sexual activity: Not on file  Other Topics Concern  . Not on file  Social History Narrative   Lives with his wife   5 children, grown.   7 grandchildren.     Family History  Problem Relation Age of Onset  . Diabetes Mother   . Diabetes Father     Past Surgical History:  Procedure Laterality Date  . CATARACT EXTRACTION  06/2017    ROS: Review of Systems Negative except as stated above. PHYSICAL EXAM: BP 132/82   Pulse 82   Temp 98 F (36.7 C) (Oral)   Resp 16   Wt 200 lb 9.6 oz (91 kg)   SpO2 97%   BMI 27.21 kg/m   Physical Exam  General appearance - alert, well appearing, and in no distress Mental status - normal  mood, behavior, speech, dress, motor activity, and thought processes Eyes -nonicteric sclera.  Pink conjunctiva Mouth - mucous membranes moist, pharynx normal without lesions Neck - supple, no significant adenopathy Chest - clear to auscultation, no wheezes, rales or rhonchi, symmetric air entry Heart -patient in sinus rhythm by auscultation.  Regular rate and rhythm.  No gallops or murmurs Abdomen - soft, nontender, nondistended, no masses or  organomegaly Neurological -power upper extremities 5/5 on the right, 4+/5 on the left.  Lower extremities 5/5 on the right, 4+/5 left proximal and distal.  Patient transfers independently.  Gait appears to be stable and noted to be ambulating without a cane. Extremities -no lower extremity edema  Results for orders placed or performed in visit on 06/09/18  CBC  Result Value Ref Range   WBC 6.1 3.4 - 10.8 x10E3/uL   RBC 4.83 4.14 - 5.80 x10E6/uL   Hemoglobin 14.3 13.0 - 17.7 g/dL   Hematocrit 41.8 37.5 - 51.0 %   MCV 87 79 - 97 fL   MCH 29.6 26.6 - 33.0 pg   MCHC 34.2 31.5 - 35.7 g/dL   RDW 13.3 12.3 - 15.4 %   Platelets 298 150 - 450 x10E3/uL  Comprehensive metabolic panel  Result Value Ref Range   Glucose 128 (H) 65 - 99 mg/dL   BUN 21 8 - 27 mg/dL   Creatinine, Ser 1.29 (H) 0.76 - 1.27 mg/dL   GFR calc non Af Amer 57 (L) >59 mL/min/1.73   GFR calc Af Amer 66 >59 mL/min/1.73   BUN/Creatinine Ratio 16 10 - 24   Sodium 138 134 - 144 mmol/L   Potassium 4.5 3.5 - 5.2 mmol/L   Chloride 101 96 - 106 mmol/L   CO2 24 20 - 29 mmol/L   Calcium 9.6 8.6 - 10.2 mg/dL   Total Protein 7.4 6.0 - 8.5 g/dL   Albumin 4.6 3.6 - 4.8 g/dL   Globulin, Total 2.8 1.5 - 4.5 g/dL   Albumin/Globulin Ratio 1.6 1.2 - 2.2   Bilirubin Total 0.3 0.0 - 1.2 mg/dL   Alkaline Phosphatase 78 39 - 117 IU/L   AST 13 0 - 40 IU/L   ALT 11 0 - 44 IU/L  POCT glucose (manual entry)  Result Value Ref Range   POC Glucose 153 (A) 70 - 99 mg/dl  POCT glycosylated hemoglobin (Hb  A1C)  Result Value Ref Range   Hemoglobin A1C     HbA1c POC (<> result, manual entry)     HbA1c, POC (prediabetic range)     HbA1c, POC (controlled diabetic range) 6.9 0.0 - 7.0 %    ASSESSMENT AND PLAN: 1. Type 2 diabetes mellitus with other neurologic complication, without long-term current use of insulin ([place]) Patient has been off of metformin for 6 months.  Is reported blood sugars are good and A1c is below 7.  We agreed to keep him off of metformin for now provided that he checks his blood sugars more often with a goal of below 140 before meals.  Continue healthy eating habits. - POCT glucose (manual entry) - POCT glycosylated hemoglobin (Hb A1C) - CBC - Comprehensive metabolic panel  2. Essential hypertension Close to goal.  Continue amlodipine and low-salt diet.  3. Hemiparesis affecting left side as late effect of stroke ([place]) Compared to neurologic exam last year, his strength is slightly improved on the left side and gait appears to be more stable.  4. History of atrial fibrillation Currently in normal sinus rhythm.  Continue Eliquis given history of CVA  5. Elevated PSA Plan for prostate biopsy.  Patient advised to hold Eliquis 2 to 3 days prior to procedure.  Advised to find out from the urologist whether he can restart the Eliquis the same day after the procedure or the following day  6. Gastroesophageal reflux disease without esophagitis DC Zantac.  Use Pepcid instead. - famotidine (PEPCID) 20 MG tablet;  Take 1 tablet (20 mg total) by mouth 2 (two) times daily.  Dispense: 60 tablet; Refill: 4  7. Need for vaccination against Streptococcus pneumoniae  8. Need for immunization against influenza - Flu Vaccine QUAD 36+ mos IM  HM: Patient due for colonoscopy.  We agreed to hold off on this until next visit because he wants to get through the prostate biopsy first.  Patient was given the opportunity to ask questions.  Patient verbalized understanding of the plan and  was able to repeat key elements of the plan.   Orders Placed This Encounter  Procedures  . Flu Vaccine QUAD 36+ mos IM  . Pneumococcal polysaccharide vaccine 23-valent greater than or equal to 2yo subcutaneous/IM  . CBC  . Comprehensive metabolic panel  . POCT glucose (manual entry)  . POCT glycosylated hemoglobin (Hb A1C)     Requested Prescriptions   Signed Prescriptions Disp Refills  . famotidine (PEPCID) 20 MG tablet 60 tablet 4    Sig: Take 1 tablet (20 mg total) by mouth 2 (two) times daily.    Return in about 3 months (around 09/08/2018).  Karle Plumber, MD, FACP

## 2018-06-09 NOTE — Patient Instructions (Addendum)
Hold Eliquis 2 to 3 days prior to your prostate biopsy.  I would find out from the urologist when it would be safe for you to restart the Eliquis after the prostate biopsy.  Stop ranitidine.  I have prescribed Pepcid instead.  Since you have been off of metformin for 6 months and your blood sugar and A1c are good, you can continue to hold off on taking it.  However I recommend checking your blood sugars at least 4-5 times a week.  As long as your blood sugars before meals are staying less than 140 you are doing good.   Pneumococcal Polysaccharide Vaccine: What You Need to Know 1. Why get vaccinated? Vaccination can protect older adults (and some children and younger adults) from pneumococcal disease. Pneumococcal disease is caused by bacteria that can spread from person to person through close contact. It can cause ear infections, and it can also lead to more serious infections of the:  Lungs (pneumonia),  Blood (bacteremia), and  Covering of the brain and spinal cord (meningitis). Meningitis can cause deafness and brain damage, and it can be fatal. Anyone can get pneumococcal disease, but children under 42 years of age, people with certain medical conditions, adults over 21 years of age, and cigarette smokers are at the highest risk. About 18,000 older adults die each year from pneumococcal disease in the [place]. Treatment of pneumococcal infections with penicillin and other drugs used to be more effective. But some strains of the disease have become resistant to these drugs. This makes prevention of the disease, through vaccination, even more important. 2. Pneumococcal polysaccharide vaccine (PPSV23) Pneumococcal polysaccharide vaccine (PPSV23) protects against 23 types of pneumococcal bacteria. It will not prevent all pneumococcal disease. PPSV23 is recommended for:  All adults 39 years of age and older,  Anyone 2 through 66 years of age with certain long-term health  problems,  Anyone 2 through 66 years of age with a weakened immune system,  Adults 82 through 66 years of age who smoke cigarettes or have asthma. Most people need only one dose of PPSV. A second dose is recommended for certain high-risk groups. People 51 and older should get a dose even if they have gotten one or more doses of the vaccine before they turned 65. Your healthcare provider can give you more information about these recommendations. Most healthy adults develop protection within 2 to 3 weeks of getting the shot. 3. Some people should not get this vaccine  Anyone who has had a life-threatening allergic reaction to PPSV should not get another dose.  Anyone who has a severe allergy to any component of PPSV should not receive it. Tell your provider if you have any severe allergies.  Anyone who is moderately or severely ill when the shot is scheduled may be asked to wait until they recover before getting the vaccine. Someone with a mild illness can usually be vaccinated.  Children less than 68 years of age should not receive this vaccine.  There is no evidence that PPSV is harmful to either a pregnant woman or to her fetus. However, as a precaution, women who need the vaccine should be vaccinated before becoming pregnant, if possible. 4. Risks of a vaccine reaction With any medicine, including vaccines, there is a chance of side effects. These are usually mild and go away on their own, but serious reactions are also possible. About half of people who get PPSV have mild side effects, such as redness or pain where the shot  is given, which go away within about two days. Less than 1 out of 100 people develop a fever, muscle aches, or more severe local reactions. Problems that could happen after any vaccine:  People sometimes faint after a medical procedure, including vaccination. Sitting or lying down for about 15 minutes can help prevent fainting, and injuries caused by a fall. Tell your  doctor if you feel dizzy, or have vision changes or ringing in the ears.  Some people get severe pain in the shoulder and have difficulty moving the arm where a shot was given. This happens very rarely.  Any medication can cause a severe allergic reaction. Such reactions from a vaccine are very rare, estimated at about 1 in a million doses, and would happen within a few minutes to a few hours after the vaccination. As with any medicine, there is a very remote chance of a vaccine causing a serious injury or death. The safety of vaccines is always being monitored. For more information, visit: http://www.aguilar.org/ 5. What if there is a serious reaction? What should I look for? Look for anything that concerns you, such as signs of a severe allergic reaction, very high fever, or unusual behavior. Signs of a severe allergic reaction can include hives, swelling of the face and throat, difficulty breathing, a fast heartbeat, dizziness, and weakness. These would usually start a few minutes to a few hours after the vaccination. What should I do? If you think it is a severe allergic reaction or other emergency that can't wait, call 9-1-1 or get to the nearest hospital. Otherwise, call your doctor. Afterward, the reaction should be reported to the Vaccine Adverse Event Reporting System (VAERS). Your doctor might file this report, or you can do it yourself through the VAERS web site at www.vaers.SamedayNews.es, or by calling [phone]. VAERS does not give medical advice. 6. How can I learn more?  Ask your doctor. He or she can give you the vaccine package insert or suggest other sources of information.  Call your local or state health department.  Contact the Centers for Disease Control and Prevention (CDC): ? Call 3346259520 (1-800-CDC-INFO) or ? Visit CDC's website at http://hunter.com/ CDC Vaccine Information Statement PPSV Vaccine (10/13/2013) This information is not intended to replace  advice given to you by your health care provider. Make sure you discuss any questions you have with your health care provider. Document Released: 04/05/2006 Document Revised: 01/18/2018 Document Reviewed: 01/18/2018 Elsevier Interactive Patient Education  2019 Elsevier Inc.  Influenza Virus Vaccine injection (Fluarix) What is this medicine? INFLUENZA VIRUS VACCINE (in floo EN zuh VAHY ruhs vak SEEN) helps to reduce the risk of getting influenza also known as the flu. This medicine may be used for other purposes; ask your health care provider or pharmacist if you have questions. COMMON BRAND NAME(S): Fluarix, Fluzone What should I tell my health care provider before I take this medicine? They need to know if you have any of these conditions: -bleeding disorder like hemophilia -fever or infection -Guillain-Barre syndrome or other neurological problems -immune system problems -infection with the human immunodeficiency virus (HIV) or AIDS -low blood platelet counts -multiple sclerosis -an unusual or allergic reaction to influenza virus vaccine, eggs, chicken proteins, latex, gentamicin, other medicines, foods, dyes or preservatives -pregnant or trying to get pregnant -breast-feeding How should I use this medicine? This vaccine is for injection into a muscle. It is given by a health care professional. A copy of Vaccine Information Statements will be given before each vaccination.  Read this sheet carefully each time. The sheet may change frequently. Talk to your pediatrician regarding the use of this medicine in children. Special care may be needed. Overdosage: If you think you have taken too much of this medicine contact a poison control center or emergency room at once. NOTE: This medicine is only for you. Do not share this medicine with others. What if I miss a dose? This does not apply. What may interact with this medicine? -chemotherapy or radiation therapy -medicines that lower your  immune system like etanercept, anakinra, infliximab, and adalimumab -medicines that treat or prevent blood clots like warfarin -phenytoin -steroid medicines like prednisone or cortisone -theophylline -vaccines This list may not describe all possible interactions. Give your health care provider a list of all the medicines, herbs, non-prescription drugs, or dietary supplements you use. Also tell them if you smoke, drink alcohol, or use illegal drugs. Some items may interact with your medicine. What should I watch for while using this medicine? Report any side effects that do not go away within 3 days to your doctor or health care professional. Call your health care provider if any unusual symptoms occur within 6 weeks of receiving this vaccine. You may still catch the flu, but the illness is not usually as bad. You cannot get the flu from the vaccine. The vaccine will not protect against colds or other illnesses that may cause fever. The vaccine is needed every year. What side effects may I notice from receiving this medicine? Side effects that you should report to your doctor or health care professional as soon as possible: -allergic reactions like skin rash, itching or hives, swelling of the face, lips, or tongue Side effects that usually do not require medical attention (report to your doctor or health care professional if they continue or are bothersome): -fever -headache -muscle aches and pains -pain, tenderness, redness, or swelling at site where injected -weak or tired This list may not describe all possible side effects. Call your doctor for medical advice about side effects. You may report side effects to FDA at 1-800-FDA-1088. Where should I keep my medicine? This vaccine is only given in a clinic, pharmacy, doctor's office, or other health care setting and will not be stored at home. NOTE: This sheet is a summary. It may not cover all possible information. If you have questions about  this medicine, talk to your doctor, pharmacist, or health care provider.  2019 Elsevier/Gold Standard (2008-01-04 09:30:40)

## 2018-06-10 ENCOUNTER — Other Ambulatory Visit: Payer: Self-pay | Admitting: Internal Medicine

## 2018-06-10 LAB — COMPREHENSIVE METABOLIC PANEL
ALK PHOS: 78 IU/L (ref 39–117)
ALT: 11 IU/L (ref 0–44)
AST: 13 IU/L (ref 0–40)
Albumin/Globulin Ratio: 1.6 (ref 1.2–2.2)
Albumin: 4.6 g/dL (ref 3.6–4.8)
BUN/Creatinine Ratio: 16 (ref 10–24)
BUN: 21 mg/dL (ref 8–27)
Bilirubin Total: 0.3 mg/dL (ref 0.0–1.2)
CO2: 24 mmol/L (ref 20–29)
Calcium: 9.6 mg/dL (ref 8.6–10.2)
Chloride: 101 mmol/L (ref 96–106)
Creatinine, Ser: 1.29 mg/dL — ABNORMAL HIGH (ref 0.76–1.27)
GFR calc Af Amer: 66 mL/min/{1.73_m2} (ref >59)
GFR calc non Af Amer: 57 mL/min/{1.73_m2} — ABNORMAL LOW (ref >59)
Globulin, Total: 2.8 g/dL (ref 1.5–4.5)
Glucose: 128 mg/dL — ABNORMAL HIGH (ref 65–99)
Potassium: 4.5 mmol/L (ref 3.5–5.2)
Sodium: 138 mmol/L (ref 134–144)
Total Protein: 7.4 g/dL (ref 6.0–8.5)

## 2018-06-10 LAB — CBC
Hematocrit: 41.8 % (ref 37.5–51.0)
Hemoglobin: 14.3 g/dL (ref 13.0–17.7)
MCH: 29.6 pg (ref 26.6–33.0)
MCHC: 34.2 g/dL (ref 31.5–35.7)
MCV: 87 fL (ref 79–97)
Platelets: 298 10*3/uL (ref 150–450)
RBC: 4.83 x10E6/uL (ref 4.14–5.80)
RDW: 13.3 % (ref 12.3–15.4)
WBC: 6.1 10*3/uL (ref 3.4–10.8)

## 2018-06-13 ENCOUNTER — Telehealth: Payer: Self-pay

## 2018-06-13 NOTE — Telephone Encounter (Signed)
Contacted pt to go over lab results pt is aware  Dr. [name] pt wants to me to let you know that his procedure is Feb 7th not Jan 7th, but he is aware to stop Eliquis 3 days prior

## 2018-06-28 MED FILL — levoFLOXacin 750 MG TABS: 750 | 1 days supply | Qty: 1 | Fill #0

## 2018-07-01 MED FILL — ELIQUIS 5 MG TABLET: 5 | 30 days supply | Qty: 60 | Fill #3

## 2018-07-04 ENCOUNTER — Telehealth: Payer: Self-pay | Admitting: Internal Medicine

## 2018-07-04 NOTE — Telephone Encounter (Signed)
Phone call returned to patient.  I recommend holding the Eliquis for 2 days prior to his teeth extraction.  Patient states he will reschedule the extraction for later this week or next week.

## 2018-07-04 NOTE — Telephone Encounter (Signed)
Patient called states tooth extraction scheduled at 3pm tomorrow 07/05/18. Pt wants to know if he should stop Eliquis prior to the appt.  Pt states if he does not hear back he will plan to skip the morning dose.

## 2018-07-18 MED FILL — AMLODIPINE BESYLATE 10 MG T: 10 | 30 days supply | Qty: 30 | Fill #5

## 2018-07-25 MED FILL — FAMOTIDINE 20 MG TABLET: 20 | 30 days supply | Qty: 60 | Fill #0

## 2018-07-28 MED FILL — hydrOXYzine HCL 25 MG TABS: 25 | 7 days supply | Qty: 30 | Fill #2

## 2018-07-29 DIAGNOSIS — R972 Elevated prostate specific antigen [PSA]: Secondary | ICD-10-CM | POA: Diagnosis not present

## 2018-08-04 ENCOUNTER — Other Ambulatory Visit (HOSPITAL_COMMUNITY): Payer: Self-pay | Admitting: Urology

## 2018-08-04 DIAGNOSIS — C61 Malignant neoplasm of prostate: Secondary | ICD-10-CM

## 2018-08-10 ENCOUNTER — Telehealth: Payer: Self-pay

## 2018-08-10 DIAGNOSIS — C61 Malignant neoplasm of prostate: Secondary | ICD-10-CM | POA: Diagnosis not present

## 2018-08-10 NOTE — Telephone Encounter (Signed)
Pt contacted the office and wanted to make Dr. [name] aware that he did get the biopsy done and he does have prostate cancer. Pt states her had 3 PSA test drawn and they were all high. Pt states he has his PET scan scheduled for 08/15/18.  Pt states that he appreciates Dr. [name] and everything that she does to help him.   Pt states that after his PET scan when he follows back up with the urologist he will like to talk with Dr. [name] about the different options when it comes to it. Pt states he would like Dr. [name] input regarding this matter.   Pt states he hope this is not a lot to ask but he would greatly appreciate it.

## 2018-08-15 ENCOUNTER — Encounter (HOSPITAL_COMMUNITY)
Admission: RE | Admit: 2018-08-15 | Discharge: 2018-08-15 | Disposition: A | Discharge status: Home / Self Care | Payer: Medicare HMO | Source: Ambulatory Visit | Attending: Urology | Admitting: Urology

## 2018-08-15 DIAGNOSIS — C61 Malignant neoplasm of prostate: Secondary | ICD-10-CM | POA: Insufficient documentation

## 2018-08-15 DIAGNOSIS — R310 Gross hematuria: Secondary | ICD-10-CM | POA: Diagnosis not present

## 2018-08-15 MED ORDER — TECHNETIUM TC 99M MEDRONATE IV KIT
20.8000 | PACK | Freq: Once | INTRAVENOUS | Status: AC | PRN
  Administered 2018-08-15: 20.8 via INTRAVENOUS

## 2018-08-24 DIAGNOSIS — C61 Malignant neoplasm of prostate: Secondary | ICD-10-CM | POA: Diagnosis not present

## 2018-08-25 MED FILL — AMLODIPINE BESYLATE 10 MG T: 10 | 30 days supply | Qty: 30 | Fill #6

## 2018-08-26 ENCOUNTER — Encounter: Payer: Self-pay | Admitting: *Deleted

## 2018-09-02 ENCOUNTER — Other Ambulatory Visit: Payer: Self-pay | Admitting: Internal Medicine

## 2018-09-02 DIAGNOSIS — L309 Dermatitis, unspecified: Secondary | ICD-10-CM

## 2018-09-05 MED FILL — hydrOXYzine HCL 25 MG TABS: 25 | 21 days supply | Qty: 90 | Fill #0

## 2018-09-13 ENCOUNTER — Institutional Professional Consult (permissible substitution): Payer: Medicare HMO | Admitting: Radiation Oncology

## 2018-09-13 ENCOUNTER — Ambulatory Visit: Payer: Medicare HMO

## 2018-09-22 ENCOUNTER — Encounter: Payer: Self-pay | Admitting: Radiation Oncology

## 2018-09-22 NOTE — Progress Notes (Signed)
GU Location of Tumor / Histology: prostatic adenocarcinoma  If Prostate Cancer, Gleason Score is (4 + 3) and PSA is (15.2). Prostate volume: 32.96 grams.  Asberry C Drumwright was first noted to have an elevated PSA in November of 2019. His PCP, Karle Plumber, initial discovered his elevated PSA.   Biopsies of prostate (if applicable) revealed:    Past/Anticipated interventions by urology, if any: prostate biopsy, CT abd/pelvis (negative), bone scan (negative), referral for consideration of brachytherapy vs EBRT with or without ADT.  Past/Anticipated interventions by medical oncology, if any: no  Weight changes, if any: no  Bowel/Bladder complaints, if any: Reports occasional urgency and weak stream. Denies dysuria or hematuria. Denies urinary leakage or incontinence.   Nausea/Vomiting, if any: no  Pain issues, if any:  no  SAFETY ISSUES:  Prior radiation? no  Pacemaker/ICD? no  Possible current pregnancy? no, male patient  Is the patient on methotrexate? no  Current Complaints / other details:  68 year old male. Married with five children. Retired. Former smoker.

## 2018-09-23 ENCOUNTER — Encounter: Payer: Self-pay | Admitting: Radiation Oncology

## 2018-09-23 ENCOUNTER — Ambulatory Visit
Admission: RE | Admit: 2018-09-23 | Discharge: 2018-09-23 | Disposition: A | Discharge status: Home / Self Care | Payer: Medicare HMO | Source: Ambulatory Visit | Attending: Radiation Oncology | Admitting: Radiation Oncology

## 2018-09-23 ENCOUNTER — Other Ambulatory Visit: Payer: Self-pay

## 2018-09-23 DIAGNOSIS — C61 Malignant neoplasm of prostate: Secondary | ICD-10-CM | POA: Insufficient documentation

## 2018-09-23 DIAGNOSIS — Z803 Family history of malignant neoplasm of breast: Secondary | ICD-10-CM | POA: Diagnosis not present

## 2018-09-23 DIAGNOSIS — R972 Elevated prostate specific antigen [PSA]: Secondary | ICD-10-CM | POA: Diagnosis not present

## 2018-09-23 HISTORY — DX: Malignant neoplasm of prostate: C61

## 2018-09-23 MED FILL — ELIQUIS 5 MG TABLET: 5 | 90 days supply | Qty: 180 | Fill #4

## 2018-09-23 NOTE — Progress Notes (Signed)
Radiation Oncology         [phone] ________________________________  Initial Outpatient Consultation - Conducted via telephone due to current COVID-19 concerns for limiting patient exposure  Name: [name] MRN: [id number]  Date: 09/23/2018  DOB: 1951-06-17  YF:[id number], Scott Batman, MD  Festus Aloe, MD   REFERRING PHYSICIAN: Festus Aloe, MD  DIAGNOSIS: 68 y.o. gentleman with Stage T1c adenocarcinoma of the prostate with Gleason score of 4+3, and PSA of 15.2.    ICD-10-CM   1. Malignant neoplasm of prostate ([place]) C61     HISTORY OF PRESENT ILLNESS: [name] is a 68 y.o. male with a diagnosis of prostate cancer. He is an established patient at [facility] initially seen and treated for LUTS and gross hematuria.  He had an office cystoscopy and a CT abdomen in November 2019 both of which were negative but during his work-up he was incidentally noted to have an elevated PSA at 15.8 on November 6.  Repeat PSA on 05/10/2018 was further elevated at 16.4 and remained elevated at 15.2 when repeated on May 31, 2018.   Digital rectal exam was performed at that time and did not reveal any concerning prostate nodules.  The patient proceeded to transrectal ultrasound with 12 biopsies of the prostate on 07/29/2018.  The prostate volume measured 33 cc.  Out of 12 core biopsies, 7 were positive.  The maximum Gleason score was 4+3, and this was seen in right apex and right apex lateral.  Additionally, Gleason 3+4 disease was seen in right mid, right base lateral, and right mid lateral and Gleason 3+3 disease was seen in right base and left mid lateral.  A CT A/P was performed on 08/15/2018 for disease staging and showed no findings to suggest nodal or solid organ metastasis. A bone scan performed the same day showed no scintigraphic evidence of skeletal metastasis.  The patient reviewed the biopsy results with his urologist and he has kindly been referred today for  discussion of potential radiation treatment options.  PREVIOUS RADIATION THERAPY: No  PAST MEDICAL HISTORY:  Past Medical History:  Diagnosis Date  . Abscess of anal and rectal regions 07/15/2007   Qualifier: Diagnosis of  By: Amil Amen MD, Benjamine Mola    . Atrial fibrillation ([place])   . Diabetes mellitus without complication ([place]) Dx 7591  . FISTULA, ANAL 01/27/2007   Qualifier: Diagnosis of  By: Amil Amen MD, Benjamine Mola    . Hypertension Dx 2008  . Prostate cancer ([place])   . Stroke [facility])       PAST SURGICAL HISTORY: Past Surgical History:  Procedure Laterality Date  . CATARACT EXTRACTION  06/2017  . CYSTOSCOPY  05/2017  . PROSTATE BIOPSY      FAMILY HISTORY:  Family History  Problem Relation Age of Onset  . Diabetes Mother   . Breast cancer Mother   . Diabetes Father   . Breast cancer Sister   . Prostate cancer Neg Hx   . Colon cancer Neg Hx   . Pancreatic cancer Neg Hx     SOCIAL HISTORY:  Social History   Socioeconomic History  . Marital status: Married    Spouse name: Not on file  . Number of children: 5  . Years of education: Not on file  . Highest education level: Not on file  Occupational History  . Occupation: Caregiver    Comment: retired  Scientific laboratory technician  . Financial resource strain: Not on file  . Food insecurity:    Worry: Not  on file    Inability: Not on file  . Transportation needs:    Medical: Not on file    Non-medical: Not on file  Tobacco Use  . Smoking status: Former Smoker    Packs/day: 0.25    Years: 5.00    Pack years: 1.25    Types: Cigarettes    Last attempt to quit: 05/25/2014    Years since quitting: 4.3  . Smokeless tobacco: Never Used  . Tobacco comment: Quit 05/15/14  Substance and Sexual Activity  . Alcohol use: No    Alcohol/week: 0.0 standard drinks  . Drug use: No  . Sexual activity: Not Currently  Lifestyle  . Physical activity:    Days per week: Not on file    Minutes per session: Not on file  . Stress: Not on file   Relationships  . Social connections:    Talks on phone: Not on file    Gets together: Not on file    Attends religious service: Not on file    Active member of club or organization: Not on file    Attends meetings of clubs or organizations: Not on file    Relationship status: Not on file  . Intimate partner violence:    Fear of current or ex partner: Not on file    Emotionally abused: Not on file    Physically abused: Not on file    Forced sexual activity: Not on file  Other Topics Concern  . Not on file  Social History Narrative   Lives with his wife   5 children, grown.   7 grandchildren.     ALLERGIES: Lisinopril  MEDICATIONS:  Current Outpatient Medications  Medication Sig Dispense Refill  . apixaban (ELIQUIS) 5 MG TABS tablet TAKE ONE TABLET 2 TIMES A DAY 180 tablet 3  . Blood Glucose Monitoring Suppl (TRUE METRIX METER) w/Device KIT Use as directed 1 kit 0  . diclofenac sodium (VOLTAREN) 1 % GEL Apply 2 g topically 4 (four) times daily. 100 g 1  . famotidine (PEPCID) 20 MG tablet Take 1 tablet (20 mg total) by mouth 2 (two) times daily. 60 tablet 4  . GlucoCom Lancets MISC 100 Units by Does not apply route once. 100 each 0  . glucose blood (TRUE METRIX BLOOD GLUCOSE TEST) test strip Use as instructed 100 each 12  . hydrOXYzine (ATARAX/VISTARIL) 25 MG tablet TAKE 1 TABLET (25 MG TOTAL) BY MOUTH EVERY 6 (SIX) HOURS. 30 tablet 2  . amLODipine (NORVASC) 10 MG tablet Take by mouth.    . baclofen (LIORESAL) 10 MG tablet Take 0.5 tablets (5 mg total) by mouth daily as needed for muscle spasms. (Patient not taking: Reported on 09/23/2018) 15 each 0  . HYDROcodone-acetaminophen (NORCO/VICODIN) 5-325 MG tablet TAKE 1 TABLET BY MOUTH EVERY 5 TO 6 HOURS AS NEEDED FOR DENTAL PAIN    . levofloxacin (LEVAQUIN) 750 MG tablet     . metFORMIN (GLUCOPHAGE) 500 MG tablet TAKE 1 2 TABLET BY MOUTH DAILY WITH BREAKFAST    . Multiple Vitamins-Minerals (MENS MULTIVITAMIN PLUS PO) Take 1 tablet by  mouth daily.    . penicillin v potassium (VEETID) 500 MG tablet TAKE 1 TABLET BY MOUTH 4 TIMES DAILY BEFORE MEALS AND AT BEDTIME    . pravastatin (PRAVACHOL) 10 MG tablet Take 1 tablet (10 mg total) by mouth daily. (Patient not taking: Reported on 09/23/2018) 30 tablet 5  . ranitidine (ZANTAC) 150 MG tablet Take 1 tablet (150 mg total) by mouth  2 (two) times daily. (Patient not taking: Reported on 09/23/2018) 60 tablet 11  . sildenafil (VIAGRA) 100 MG tablet Take 0.5-1 tablets (50-100 mg total) by mouth daily as needed for erectile dysfunction. (Patient not taking: Reported on 09/23/2018) 2 tablet 0  . traMADol (ULTRAM) 50 MG tablet TAKE 1 TABLET BY MOUTH EVERY 8 HOURS AS NEEDED FOR PAIN. (Patient not taking: Reported on 09/23/2018) 15 tablet 0  . triamcinolone cream (KENALOG) 0.1 % Apply 1 application topically 2 (two) times daily. (Patient not taking: Reported on 09/23/2018) 30 g 0   No current facility-administered medications for this encounter.     REVIEW OF SYSTEMS:  On review of systems, the patient reports that he is doing well overall. He denies any chest pain, shortness of breath, cough, fevers, chills, night sweats, unintended weight changes. He denies any bowel disturbances, and denies abdominal pain, nausea or vomiting. He denies any new musculoskeletal or joint aches or pains. His IPSS was 3, indicating mild urinary symptoms. His SHIM was 1, indicating he has severe erectile dysfunction. A complete review of systems is obtained and is otherwise negative.    PHYSICAL EXAM:  Wt Readings from Last 3 Encounters:  09/23/18 195 lb (88.5 kg)  06/09/18 200 lb 9.6 oz (91 kg)  02/08/18 196 lb 12.8 oz (89.3 kg)   Temp Readings from Last 3 Encounters:  06/09/18 98 F (36.7 C) (Oral)  02/08/18 98.3 F (36.8 C) (Oral)  11/23/17 98.4 F (36.9 C) (Oral)   BP Readings from Last 3 Encounters:  06/09/18 132/82  02/08/18 139/83  11/23/17 (!) 144/82   Pulse Readings from Last 3 Encounters:  06/09/18  82  02/08/18 85  11/23/17 83   Pain Assessment Pain Score: 0-No pain/10  Not performed due to telephone consult visit format.   KPS = 90  100 - Normal; no complaints; no evidence of disease. 90   - Able to carry on normal activity; minor signs or symptoms of disease. 80   - Normal activity with effort; some signs or symptoms of disease. 81   - Cares for self; unable to carry on normal activity or to do active work. 60   - Requires occasional assistance, but is able to care for most of his personal needs. 50   - Requires considerable assistance and frequent medical care. 3   - Disabled; requires special care and assistance. 71   - Severely disabled; hospital admission is indicated although death not imminent. 17   - Very sick; hospital admission necessary; active supportive treatment necessary. 10   - Moribund; fatal processes progressing rapidly. 0     - Dead  Karnofsky DA, Abelmann [place], Craver LS and Burchenal [facility] [phone]) The use of the nitrogen mustards in the palliative treatment of carcinoma: with particular reference to bronchogenic carcinoma Cancer 1 634-56  LABORATORY DATA:  Lab Results  Component Value Date   WBC 6.1 06/09/2018   HGB 14.3 06/09/2018   HCT 41.8 06/09/2018   MCV 87 06/09/2018   PLT 298 06/09/2018   Lab Results  Component Value Date   NA 138 06/09/2018   K 4.5 06/09/2018   CL 101 06/09/2018   CO2 24 06/09/2018   Lab Results  Component Value Date   ALT 11 06/09/2018   AST 13 06/09/2018   ALKPHOS 78 06/09/2018   BILITOT 0.3 06/09/2018     RADIOGRAPHY: No results found.    IMPRESSION/PLAN: This visit was conducted via telephone to spare the patient unnecessary potential  exposure in the healthcare setting during the current COVID-19 pandemic.   1. 68 y.o. gentleman with Stage T1c adenocarcinoma of the prostate with Gleason Score of 4+3, and PSA of 15.2. Today, we discussed the patient's workup and outlined the nature of prostate cancer in this  setting. The patient's T stage, Gleason's score, and PSA put him into the unfavorable intermediate risk group. Accordingly, he is eligible for a variety of potential treatment options including brachytherapy, 5.5 weeks of external radiation or prostatectomy.  The patient reports that he has already made his decision to proceed with prostatectomy and is not interested learning about primary radiotherapy for prostate cancer treatment.  Instead, we discussed some of the potential proven indications for postoperative radiotherapy including positive margins, extracapsular extension, and seminal vesicle involvement. We also talked about some of the other potential findings leading to a recommendation for radiotherapy including a non-zero postoperative PSA and positive lymph nodes.   At the conclusion of the conversation, the patient elects to proceed with prostatectomy. He declined the opportunity to speak directly with Dr. Tammi Klippel and reported a good understanding of our discussion and recommendations. We will share this information with Dr. Junious Silk so that they can move forward with surgical scheduling accordingly.  I enjoyed meeting with him today, and will look forward to participating in the care of this very nice gentleman should there be any indication for radiation in the future. We look forward to following his progress.  Given current concerns for patient exposure during the COVID-19 pandemic, this encounter was conducted via telephone. The patient was notified in advance and was offered a [place] meeting to allow for face to face communication but unfortunately reported that he did not have the appropriate resources/technology to support such a visit and instead preferred to proceed with telephone consult. The time spent during this encounter was 30 minutes with 50% of that time spent in review of outside records and coordination of the patient's care. The attendants for this meeting include Scott Pita MD, Scott Grumbine PA-C, [place], and patient, [name] and his wife. During the encounter, Scott Heavner PA-C, and scribe, Wilburn Mylar were located at [place].  Patient [name] and his wife were located at home.    Nicholos Johns, MMS, PA-C [place] at [place]: [phone]  Fax: [phone]  This document serves as a record of services personally performed by Blair Lundeen, PA-C. It was created on her behalf by Wilburn Mylar, a trained medical scribe. The creation of this record is based on the scribe's personal observations and the provider's statements to them. This document has been checked and approved by the attending provider.

## 2018-09-23 NOTE — Progress Notes (Signed)
See progress note under physician encounter. 

## 2018-09-27 ENCOUNTER — Telehealth: Payer: Self-pay | Admitting: Medical Oncology

## 2018-09-27 NOTE — Telephone Encounter (Signed)
Called [name] to introduce myself as the prostate nurse navigator and my role. He is not interested in radiation but wants surgery. Dr. Junious Silk spoke with him this morning and is getting him scheduled with Dr. Alinda Money. I asked to call with questions or concerns.

## 2018-09-28 MED FILL — AMLODIPINE BESYLATE 10 MG T: 10 | 30 days supply | Qty: 30 | Fill #7

## 2018-10-27 ENCOUNTER — Telehealth: Payer: Self-pay | Admitting: Internal Medicine

## 2018-10-27 NOTE — Telephone Encounter (Signed)
New Message  Pt is calling, states he just had some teeth extracted and wants to know when he can go back on his Eliquis. Please f/u

## 2018-10-28 NOTE — Telephone Encounter (Signed)
Left voicemail for patient

## 2018-10-28 NOTE — Telephone Encounter (Signed)
Called patient left message on voicemail.   -  Would like to know when he had extraction done?

## 2018-10-31 NOTE — Telephone Encounter (Signed)
Contacted pt and left a detailed vm informing pt that he can restart eliquis after he get his teeth extracted and if he has any questions or concerns to give me a call

## 2018-11-01 DIAGNOSIS — C61 Malignant neoplasm of prostate: Secondary | ICD-10-CM | POA: Diagnosis not present

## 2018-11-09 MED FILL — AMLODIPINE BESYLATE 10 MG T: 10 | 30 days supply | Qty: 30 | Fill #8

## 2018-11-10 DIAGNOSIS — Z961 Presence of intraocular lens: Secondary | ICD-10-CM | POA: Diagnosis not present

## 2018-11-10 DIAGNOSIS — E119 Type 2 diabetes mellitus without complications: Secondary | ICD-10-CM | POA: Diagnosis not present

## 2018-11-10 DIAGNOSIS — H524 Presbyopia: Secondary | ICD-10-CM | POA: Diagnosis not present

## 2018-11-10 LAB — HM DIABETES EYE EXAM

## 2018-11-15 ENCOUNTER — Telehealth: Payer: Self-pay | Admitting: Internal Medicine

## 2018-11-15 NOTE — Telephone Encounter (Signed)
Patient called stating his cane broke but [facility] and was told his insurance will more than likely not cover a cane so he purchased one out of pocket.

## 2018-11-16 NOTE — Telephone Encounter (Signed)
FYI

## 2018-11-29 ENCOUNTER — Telehealth: Payer: Self-pay | Admitting: Internal Medicine

## 2018-11-29 ENCOUNTER — Other Ambulatory Visit: Payer: Self-pay | Admitting: Urology

## 2018-11-29 NOTE — Telephone Encounter (Signed)
Tried contacting pt to schedule surgery clearance vm is full

## 2018-11-29 NOTE — Telephone Encounter (Signed)
[name] called in regards to a fax for medical clearance to see if it has been received. Please follow up.

## 2018-11-29 NOTE — Telephone Encounter (Signed)
Called to make an appointment the mail box was full

## 2018-11-29 NOTE — Telephone Encounter (Signed)
Will forward to pcp

## 2018-11-29 NOTE — Telephone Encounter (Signed)
-----   Message from [name], MD sent at 11/23/2018  1:02 PM EDT ----- This pt needs in person appt for pre-op exam.  Please schedule for next week in the morning.

## 2018-11-29 NOTE — Telephone Encounter (Signed)
Patient was called and patient states that he has lost 2 family members and would like to postpone his surgery in order to deal with his loss.

## 2018-11-30 NOTE — Telephone Encounter (Signed)
Contacted pt to go over Dr. Johnson message pt didn't answer and was unable to lvm due to vm being full  °

## 2018-12-05 ENCOUNTER — Other Ambulatory Visit: Payer: Self-pay | Admitting: Internal Medicine

## 2018-12-05 DIAGNOSIS — L309 Dermatitis, unspecified: Secondary | ICD-10-CM

## 2018-12-05 MED FILL — hydrOXYzine HCL 25 MG TABS: 25 | 7 days supply | Qty: 30 | Fill #0

## 2018-12-12 MED FILL — AMLODIPINE BESYLATE 10 MG T: 10 | 30 days supply | Qty: 30 | Fill #9

## 2018-12-30 ENCOUNTER — Telehealth: Payer: Self-pay | Admitting: Internal Medicine

## 2018-12-30 NOTE — Telephone Encounter (Signed)
[name] called with urology stating that she faxed over paperwork for medical clearance and would like to know if it has been received. Please follow up.

## 2018-12-30 NOTE — Telephone Encounter (Signed)
MA left a voicemail sharing no clearance had been received as of yet.

## 2019-01-05 ENCOUNTER — Ambulatory Visit: Payer: Medicare HMO | Admitting: Internal Medicine

## 2019-01-17 DIAGNOSIS — H10502 Unspecified blepharoconjunctivitis, left eye: Secondary | ICD-10-CM | POA: Diagnosis not present

## 2019-01-17 MED FILL — TOBRAMYCIN 0.3 % SOLN: 0.3 | 7 days supply | Qty: 5 | Fill #0

## 2019-01-24 ENCOUNTER — Other Ambulatory Visit: Payer: Self-pay | Admitting: Internal Medicine

## 2019-01-25 ENCOUNTER — Telehealth: Payer: Self-pay | Admitting: Internal Medicine

## 2019-01-25 MED ORDER — AMLODIPINE BESYLATE 10 MG PO TABS: 10.0000 mg | ORAL_TABLET | Freq: Every day | ORAL | 3 refills | Status: DC

## 2019-01-25 NOTE — Telephone Encounter (Signed)
1) Medication(s) Requested (by name): AMLODIPINE no refills remain.  2) Pharmacy of Choice:  [facility]  3) Special Requests:  APPOINTMENT JOHNSON 02/24/19.   Approved medications will be sent to the pharmacy, we will reach out if there is an issue.  Requests made after 3pm may not be addressed until the following business day!  If a patient is unsure of the name of the medication(s) please note and ask patient to call back when they are able to provide all info, do not send to responsible party until all information is available!

## 2019-01-25 NOTE — Telephone Encounter (Signed)
RF sent on Norvasc.

## 2019-01-26 MED FILL — AMLODIPINE BESYLATE 10 MG T: 10 | 30 days supply | Qty: 30 | Fill #0

## 2019-01-30 ENCOUNTER — Ambulatory Visit: Admit: 2019-01-30 | Payer: Medicare HMO | Admitting: Urology

## 2019-01-30 SURGERY — XI ROBOTIC ASSISTED LAPAROSCOPIC RADICAL PROSTATECTOMY LEVEL 2
Anesthesia: General

## 2019-02-24 ENCOUNTER — Ambulatory Visit: Payer: Medicare HMO | Attending: Internal Medicine | Admitting: Internal Medicine

## 2019-02-24 ENCOUNTER — Other Ambulatory Visit: Payer: Self-pay

## 2019-02-24 ENCOUNTER — Encounter: Payer: Self-pay | Admitting: Internal Medicine

## 2019-02-24 VITALS — BP 130/84

## 2019-02-24 DIAGNOSIS — L309 Dermatitis, unspecified: Secondary | ICD-10-CM

## 2019-02-24 DIAGNOSIS — I480 Paroxysmal atrial fibrillation: Secondary | ICD-10-CM | POA: Diagnosis not present

## 2019-02-24 DIAGNOSIS — C61 Malignant neoplasm of prostate: Secondary | ICD-10-CM

## 2019-02-24 DIAGNOSIS — I6930 Unspecified sequelae of cerebral infarction: Secondary | ICD-10-CM | POA: Diagnosis not present

## 2019-02-24 DIAGNOSIS — E119 Type 2 diabetes mellitus without complications: Secondary | ICD-10-CM | POA: Insufficient documentation

## 2019-02-24 DIAGNOSIS — E1149 Type 2 diabetes mellitus with other diabetic neurological complication: Secondary | ICD-10-CM | POA: Diagnosis not present

## 2019-02-24 DIAGNOSIS — Z23 Encounter for immunization: Secondary | ICD-10-CM | POA: Diagnosis not present

## 2019-02-24 DIAGNOSIS — I10 Essential (primary) hypertension: Secondary | ICD-10-CM

## 2019-02-24 MED ORDER — HYDROXYZINE HCL 25 MG PO TABS: 25.0000 mg | ORAL_TABLET | Freq: Two times a day (BID) | ORAL | 0 refills | Status: DC | PRN

## 2019-02-24 MED ORDER — PRAVASTATIN SODIUM 10 MG PO TABS: 10.0000 mg | ORAL_TABLET | Freq: Every day | ORAL | 5 refills | Status: DC

## 2019-02-24 MED FILL — PRAVASTATIN SODIUM 10 MG TA: 10 | 30 days supply | Qty: 30 | Fill #0

## 2019-02-24 MED FILL — hydrOXYzine HCL 25 MG TABS: 25 | 15 days supply | Qty: 30 | Fill #0

## 2019-02-24 NOTE — Progress Notes (Signed)
Virtual Visit via Telephone Note Due to current restrictions/limitations of in-office visits due to the COVID-19 pandemic, this scheduled clinical appointment was converted to a telehealth visit  I connected with [name] on 02/24/19 at 2:21 p.m by telephone and verified that I am speaking with the correct person using two identifiers. I am in my office.  The patient is at home.  Only the patient and myself participated in this encounter.  I discussed the limitations, risks, security and privacy concerns of performing an evaluation and management service by telephone and the availability of in person appointments. I also discussed with the patient that there may be a patient responsible charge related to this service. The patient expressed understanding and agreed to proceed.   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 cocaine abusein remission since 2005   Prostate CA: pt discussed treatment options with his urologist.  He had planned for prostatectomy but pt canceled due to family issues.  Now he states he is postponing until COVID #s decrease more -still has good urine flow  HYPERTENSION Currently taking: see medication list -Norvasc Med Adherence: [x] Yes    [] No Medication side effects: [] Yes    [x] No Adherence with salt restriction: [x] Yes    [] No Home Monitoring?: [x] Yes    [] No Monitoring Frequency:  130/84 today.    Home BP results range: [] Yes    [] No SOB? [] Yes    [x] No Chest Pain?: [] Yes    [x] No Leg swelling?: [] Yes    [x] No Headaches?: [] Yes    [x] No Dizziness? [] Yes    [x] No Comments:   DM: Metformin is on his med list but actually it was discontinued on last visit because he was doing good.  Checks BS QOD.  BS today was 98.  Highest reading in the past several wks was 115. Overall he feels that he is doing very well with his eating habits.  He tries to avoid foods  with a lot of refined sugars.  Eating a lot of fresh fruits and vegetables.  A.fib/CVA with residual weakness:  No bruising or bleeding on Eliquis. Tripped and fell in his yard several wks ago when his foot got caught in a small hole in his grass.  Otherwise he has not had any major falls.  He is independent in his ADLs.  Requests refill on hydroxyzine which he takes as needed for intermittent itchy rash  Outpatient Encounter Medications as of 02/24/2019  Medication Sig  . amLODipine (NORVASC) 10 MG tablet Take 1 tablet (10 mg total) by mouth daily.  Marland Kitchen apixaban (ELIQUIS) 5 MG TABS tablet TAKE ONE TABLET 2 TIMES A DAY  . baclofen (LIORESAL) 10 MG tablet Take 0.5 tablets (5 mg total) by mouth daily as needed for muscle spasms. (Patient not taking: Reported on 09/23/2018)  . Blood Glucose Monitoring Suppl (TRUE METRIX METER) w/Device KIT Use as directed  . diclofenac sodium (VOLTAREN) 1 % GEL Apply 2 g topically 4 (four) times daily.  . famotidine (PEPCID) 20 MG tablet Take 1 tablet (20 mg total) by mouth 2 (two) times daily.  . GlucoCom Lancets MISC 100 Units by Does not apply route once.  Marland Kitchen glucose blood (TRUE METRIX BLOOD GLUCOSE TEST) test strip Use as instructed  . HYDROcodone-acetaminophen (NORCO/VICODIN) 5-325 MG tablet TAKE 1 TABLET BY  MOUTH EVERY 5 TO 6 HOURS AS NEEDED FOR DENTAL PAIN  . hydrOXYzine (ATARAX/VISTARIL) 25 MG tablet Take 1 tablet (25 mg total) by mouth every 6 (six) hours. MUST MAKE APPT FOR FURTHER REFILLS  . levofloxacin (LEVAQUIN) 750 MG tablet   . metFORMIN (GLUCOPHAGE) 500 MG tablet TAKE 1 2 TABLET BY MOUTH DAILY WITH BREAKFAST  . Multiple Vitamins-Minerals (MENS MULTIVITAMIN PLUS PO) Take 1 tablet by mouth daily.  . penicillin v potassium (VEETID) 500 MG tablet TAKE 1 TABLET BY MOUTH 4 TIMES DAILY BEFORE MEALS AND AT BEDTIME  . pravastatin (PRAVACHOL) 10 MG tablet Take 1 tablet (10 mg total) by mouth daily. (Patient not taking: Reported on 09/23/2018)  . ranitidine (ZANTAC)  150 MG tablet Take 1 tablet (150 mg total) by mouth 2 (two) times daily. (Patient not taking: Reported on 09/23/2018)  . sildenafil (VIAGRA) 100 MG tablet Take 0.5-1 tablets (50-100 mg total) by mouth daily as needed for erectile dysfunction. (Patient not taking: Reported on 09/23/2018)  . traMADol (ULTRAM) 50 MG tablet TAKE 1 TABLET BY MOUTH EVERY 8 HOURS AS NEEDED FOR PAIN. (Patient not taking: Reported on 09/23/2018)  . triamcinolone cream (KENALOG) 0.1 % Apply 1 application topically 2 (two) times daily. (Patient not taking: Reported on 09/23/2018)   No facility-administered encounter medications on file as of 02/24/2019.     Observations/Objective: No direct observation done as this was a telephone encounter.  Assessment and Plan: 1. Type 2 diabetes mellitus with other neurologic complication, without long-term current use of insulin ([place]) Reported blood sugars are at goal.  Encourage him to continue healthy eating habits.  Encouraged him to move and stay active as much as he can.  He will come fasting to the lab next week to have blood test done - Hemoglobin A1c; Future - CBC; Future - Comprehensive metabolic panel; Future - Lipid panel; Future  2. Essential hypertension Blood pressure close to goal.  Continue amlodipine  3. History of CVA with residual deficit Clinically stable. - pravastatin (PRAVACHOL) 10 MG tablet; Take 1 tablet (10 mg total) by mouth daily.  Dispense: 30 tablet; Refill: 5  4. Paroxysmal atrial fibrillation ([facility]) Stable on Eliquis.  5. Malignant neoplasm of prostate ([place]) Followed by urology.  6. Dermatitis - hydrOXYzine (ATARAX/VISTARIL) 25 MG tablet; Take 1 tablet (25 mg total) by mouth 2 (two) times daily as needed.  Dispense: 30 tablet; Refill: 0  7. Influenza vaccine needed Patient will get flu shot from the clinical pharmacist when he comes to the lab next week.  Patient will come next week to the lab.  On same visit he will be given the flu shot and will  see the clinical pharmacist for blood pressure recheck.  Follow Up Instructions: F/u in 3 mths   I discussed the assessment and treatment plan with the patient. The patient was provided an opportunity to ask questions and all were answered. The patient agreed with the plan and demonstrated an understanding of the instructions.   The patient was advised to call back or seek an in-person evaluation if the symptoms worsen or if the condition fails to improve as anticipated.  I provided 18 minutes of non-face-to-face time during this encounter.   Karle Plumber, MD

## 2019-03-01 ENCOUNTER — Other Ambulatory Visit: Payer: Self-pay

## 2019-03-01 ENCOUNTER — Ambulatory Visit: Payer: Medicare HMO | Attending: Internal Medicine | Admitting: Pharmacist

## 2019-03-01 ENCOUNTER — Ambulatory Visit: Payer: Medicare HMO

## 2019-03-01 ENCOUNTER — Other Ambulatory Visit: Payer: Self-pay | Admitting: Internal Medicine

## 2019-03-01 DIAGNOSIS — Z23 Encounter for immunization: Secondary | ICD-10-CM | POA: Diagnosis not present

## 2019-03-01 DIAGNOSIS — E1149 Type 2 diabetes mellitus with other diabetic neurological complication: Secondary | ICD-10-CM | POA: Diagnosis not present

## 2019-03-01 NOTE — Progress Notes (Signed)
Patient presents for vaccination against influenza per orders of Dr. Joanell Rising. Consent given. Counseling provided. No contraindications exists. Vaccine administered without incident.

## 2019-03-02 LAB — COMPREHENSIVE METABOLIC PANEL
ALT: 8 IU/L (ref 0–44)
AST: 14 IU/L (ref 0–40)
Albumin/Globulin Ratio: 1.7 (ref 1.2–2.2)
Albumin: 4.5 g/dL (ref 3.8–4.8)
Alkaline Phosphatase: 78 IU/L (ref 39–117)
BUN/Creatinine Ratio: 12 (ref 10–24)
BUN: 15 mg/dL (ref 8–27)
Bilirubin Total: 0.2 mg/dL (ref 0.0–1.2)
CO2: 22 mmol/L (ref 20–29)
Calcium: 9.8 mg/dL (ref 8.6–10.2)
Chloride: 104 mmol/L (ref 96–106)
Creatinine, Ser: 1.3 mg/dL — ABNORMAL HIGH (ref 0.76–1.27)
GFR calc Af Amer: 65 mL/min/{1.73_m2} (ref >59)
GFR calc non Af Amer: 56 mL/min/{1.73_m2} — ABNORMAL LOW (ref >59)
Globulin, Total: 2.6 g/dL (ref 1.5–4.5)
Glucose: 95 mg/dL (ref 65–99)
Potassium: 4.5 mmol/L (ref 3.5–5.2)
Sodium: 140 mmol/L (ref 134–144)
Total Protein: 7.1 g/dL (ref 6.0–8.5)

## 2019-03-02 LAB — HEMOGLOBIN A1C
Est. average glucose Bld gHb Est-mCnc: 137 mg/dL
Hgb A1c MFr Bld: 6.4 % — ABNORMAL HIGH (ref 4.8–5.6)

## 2019-03-02 LAB — CBC
Hematocrit: 40.7 % (ref 37.5–51.0)
Hemoglobin: 14.2 g/dL (ref 13.0–17.7)
MCH: 30.3 pg (ref 26.6–33.0)
MCHC: 34.9 g/dL (ref 31.5–35.7)
MCV: 87 fL (ref 79–97)
Platelets: 288 10*3/uL (ref 150–450)
RBC: 4.69 x10E6/uL (ref 4.14–5.80)
RDW: 14.1 % (ref 11.6–15.4)
WBC: 5.3 10*3/uL (ref 3.4–10.8)

## 2019-03-02 LAB — LIPID PANEL
Chol/HDL Ratio: 4 ratio (ref 0.0–5.0)
Cholesterol, Total: 191 mg/dL (ref 100–199)
HDL: 48 mg/dL (ref >39)
LDL Chol Calc (NIH): 80 mg/dL (ref 0–99)
Triglycerides: 392 mg/dL — ABNORMAL HIGH (ref 0–149)
VLDL Cholesterol Cal: 63 mg/dL — ABNORMAL HIGH (ref 5–40)

## 2019-03-06 ENCOUNTER — Telehealth: Payer: Self-pay

## 2019-03-06 MED FILL — AMLODIPINE BESYLATE 10 MG T: 10 | 30 days supply | Qty: 30 | Fill #1

## 2019-03-06 NOTE — Telephone Encounter (Signed)
Contacted pt to go over lab results pt is aware and doesn't have any questions or concerns 

## 2019-04-12 MED FILL — AMLODIPINE BESYLATE 10 MG T: 10 | 30 days supply | Qty: 30 | Fill #2

## 2019-04-26 ENCOUNTER — Other Ambulatory Visit: Payer: Self-pay | Admitting: Internal Medicine

## 2019-04-26 MED FILL — ELIQUIS 5 MG TABLET: 5 | 30 days supply | Qty: 60 | Fill #0

## 2019-04-26 NOTE — Telephone Encounter (Signed)
Please fill if appropriate.  

## 2019-05-08 MED FILL — ELIQUIS 5 MG TABLET: 5 | 30 days supply | Qty: 60 | Fill #0

## 2019-05-25 MED FILL — AMLODIPINE BESYLATE 10 MG T: 10 | 30 days supply | Qty: 30 | Fill #3

## 2019-06-02 ENCOUNTER — Ambulatory Visit: Payer: Medicare HMO | Attending: Internal Medicine | Admitting: Internal Medicine

## 2019-06-02 ENCOUNTER — Encounter: Payer: Self-pay | Admitting: Internal Medicine

## 2019-06-02 ENCOUNTER — Other Ambulatory Visit: Payer: Self-pay

## 2019-06-02 DIAGNOSIS — I480 Paroxysmal atrial fibrillation: Secondary | ICD-10-CM | POA: Diagnosis not present

## 2019-06-02 DIAGNOSIS — C61 Malignant neoplasm of prostate: Secondary | ICD-10-CM

## 2019-06-02 DIAGNOSIS — I10 Essential (primary) hypertension: Secondary | ICD-10-CM

## 2019-06-02 DIAGNOSIS — E1149 Type 2 diabetes mellitus with other diabetic neurological complication: Secondary | ICD-10-CM

## 2019-06-02 DIAGNOSIS — L308 Other specified dermatitis: Secondary | ICD-10-CM

## 2019-06-02 DIAGNOSIS — L309 Dermatitis, unspecified: Secondary | ICD-10-CM

## 2019-06-02 MED ORDER — HYDROXYZINE HCL 25 MG PO TABS: 25.0000 mg | ORAL_TABLET | Freq: Two times a day (BID) | ORAL | 0 refills | Status: DC | PRN

## 2019-06-02 MED ORDER — METOPROLOL TARTRATE 25 MG PO TABS: 12.5000 mg | ORAL_TABLET | Freq: Two times a day (BID) | ORAL | 3 refills | Status: DC

## 2019-06-02 MED FILL — hydrOXYzine HCL 25 MG TABS: 25 | 15 days supply | Qty: 30 | Fill #0

## 2019-06-02 MED FILL — METOPROLOL TARTRATE 25 MG T: 25 | 90 days supply | Qty: 90 | Fill #0

## 2019-06-02 NOTE — Progress Notes (Signed)
Pt states his sugar was 125

## 2019-06-02 NOTE — Progress Notes (Signed)
Virtual Visit via Telephone Note Due to current restrictions/limitations of in-office visits due to the COVID-19 pandemic, this scheduled clinical appointment was converted to a telehealth visit  I connected with [place] on 06/02/19 at 8:47 a.m by telephone and verified that I am speaking with the correct person using two identifiers. I am in my office.  The patient is at home.  Only the patient and myself participated in this encounter.  I discussed the limitations, risks, security and privacy concerns of performing an evaluation and management service by telephone and the availability of in person appointments. I also discussed with the patient that there may be a patient responsible charge related to this service. The patient expressed understanding and agreed to proceed.   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 cocaine abusein remission since 2005.  Patient last evaluated 02/2019.  Purpose of today's visit is chronic disease management.  Prostate CA: pt states he is still waiting until COVID # decrease before moving forward with surgery. No problems passing urine.  DM:  Checking BS daily in mornings before breakfast.  Gives range 110-140 -just got new dentures.  Trying to readjust eating with them. Eating more so fish and chicken than red meats.  Incorporates veggies in the diet. 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  Last two readings were136/95, 130/90,  -compliant with Norvasc and salt restrictions -no CP/SOB/LE  HL: LDL cholesterol was 80 not at goal.  Reports compliance with Pravachol  CKD 2: went over results from blood test 02/2019. Not 100% but stable.  A.fib/hx CVA: No bruising or bleeding on Eliquis.  Patient requests refill on  hydroxyzine.  Patient asked my opinion about the soon-to-be approved Covid vaccine  Outpatient Encounter Medications as of 06/02/2019  Medication Sig  . amLODipine (NORVASC) 10 MG tablet Take 1 tablet (10 mg total) by mouth daily.  . Blood Glucose Monitoring Suppl (TRUE METRIX METER) w/Device KIT Use as directed  . diclofenac sodium (VOLTAREN) 1 % GEL Apply 2 g topically 4 (four) times daily.  Marland Kitchen ELIQUIS 5 MG TABS tablet TAKE 1 TABLET BY MOUTH TWICE DAILY.  Marland Kitchen GlucoCom Lancets MISC 100 Units by Does not apply route once.  Marland Kitchen glucose blood (TRUE METRIX BLOOD GLUCOSE TEST) test strip Use as instructed  . hydrOXYzine (ATARAX/VISTARIL) 25 MG tablet Take 1 tablet (25 mg total) by mouth 2 (two) times daily as needed.  Marland Kitchen levofloxacin (LEVAQUIN) 750 MG tablet   . Multiple Vitamins-Minerals (MENS MULTIVITAMIN PLUS PO) Take 1 tablet by mouth daily.  . pravastatin (PRAVACHOL) 10 MG tablet Take 1 tablet (10 mg total) by mouth daily.   No facility-administered encounter medications on file as of 06/02/2019.      Observations/Objective: Results for orders placed or performed in visit on 03/01/19  Comprehensive metabolic panel  Result Value Ref Range   Glucose 95 65 - 99 mg/dL   BUN 15 8 - 27 mg/dL   Creatinine, Ser 1.30 (H) 0.76 - 1.27 mg/dL   GFR calc non Af Amer 56 (L) >59 mL/min/1.73   GFR calc Af Amer 65 >59 mL/min/1.73   BUN/Creatinine Ratio 12 10 - 24   Sodium 140 134 - 144 mmol/L  Potassium 4.5 3.5 - 5.2 mmol/L   Chloride 104 96 - 106 mmol/L   CO2 22 20 - 29 mmol/L   Calcium 9.8 8.6 - 10.2 mg/dL   Total Protein 7.1 6.0 - 8.5 g/dL   Albumin 4.5 3.8 - 4.8 g/dL   Globulin, Total 2.6 1.5 - 4.5 g/dL   Albumin/Globulin Ratio 1.7 1.2 - 2.2   Bilirubin Total 0.2 0.0 - 1.2 mg/dL   Alkaline Phosphatase 78 39 - 117 IU/L   AST 14 0 - 40 IU/L   ALT 8 0 - 44 IU/L  CBC  Result Value Ref Range   WBC 5.3 3.4 - 10.8 x10E3/uL   RBC 4.69 4.14 - 5.80 x10E6/uL   Hemoglobin 14.2 13.0 - 17.7 g/dL    Hematocrit 40.7 37.5 - 51.0 %   MCV 87 79 - 97 fL   MCH 30.3 26.6 - 33.0 pg   MCHC 34.9 31.5 - 35.7 g/dL   RDW 14.1 11.6 - 15.4 %   Platelets 288 150 - 450 x10E3/uL  Lipid panel  Result Value Ref Range   Cholesterol, Total 191 100 - 199 mg/dL   Triglycerides 392 (H) 0 - 149 mg/dL   HDL 48 >39 mg/dL   VLDL Cholesterol Cal 63 (H) 5 - 40 mg/dL   LDL Chol Calc (NIH) 80 0 - 99 mg/dL   Chol/HDL Ratio 4.0 0.0 - 5.0 ratio  Hemoglobin A1c  Result Value Ref Range   Hgb A1c MFr Bld 6.4 (H) 4.8 - 5.6 %   Est. average glucose Bld gHb Est-mCnc 137 mg/dL     Assessment and Plan: 1. Type 2 diabetes mellitus with other neurologic complication, without long-term current use of insulin (HCC) -Diet control.  Reported blood sugars are acceptable.  Encouraged him to continue healthy eating habits and regular physical activity.  2. Essential hypertension -not at goal.  Add low-dose metoprolol.  Not able to add ACE inhibitor because he has had angioedema from that in the past.  Continue to monitor blood pressure with goal being 130/80 or lower - metoprolol tartrate (LOPRESSOR) 25 MG tablet; Take 0.5 tablets (12.5 mg total) by mouth 2 (two) times daily.  Dispense: 90 tablet; Refill: 3  3. Paroxysmal atrial fibrillation (HCC) Stable on Eliquis  4. Malignant neoplasm of prostate [facility]) He is being evaluated by urology.  He is waiting for the Covid pandemic to die down before going back to urology to have planned surgery  5. Dermatitis - hydrOXYzine (ATARAX/VISTARIL) 25 MG tablet; Take 1 tablet (25 mg total) by mouth 2 (two) times daily as needed.  Dispense: 30 tablet; Refill: 0   Follow Up Instructions: 3-4 mths   I discussed the assessment and treatment plan with the patient. The patient was provided an opportunity to ask questions and all were answered. The patient agreed with the plan and demonstrated an understanding of the instructions.   The patient was advised to call back or seek an in-person  evaluation if the symptoms worsen or if the condition fails to improve as anticipated.  I provided 17 minutes of non-face-to-face time during this encounter.   Karle Plumber, MD

## 2019-07-19 ENCOUNTER — Other Ambulatory Visit: Payer: Self-pay | Admitting: Internal Medicine

## 2019-07-19 MED FILL — AMLODIPINE BESYLATE 10 MG T: 10 | 90 days supply | Qty: 90 | Fill #0

## 2019-07-27 ENCOUNTER — Encounter: Payer: Self-pay | Admitting: Internal Medicine

## 2019-07-27 ENCOUNTER — Ambulatory Visit: Payer: Medicare HMO | Attending: Internal Medicine | Admitting: Internal Medicine

## 2019-07-27 ENCOUNTER — Other Ambulatory Visit: Payer: Self-pay | Admitting: Internal Medicine

## 2019-07-27 VITALS — BP 148/90 | HR 103 | Temp 98.0°F | Resp 16 | Wt 193.2 lb

## 2019-07-27 DIAGNOSIS — Z77120 Contact with and (suspected) exposure to mold (toxic): Secondary | ICD-10-CM

## 2019-07-27 DIAGNOSIS — L602 Onychogryphosis: Secondary | ICD-10-CM | POA: Diagnosis not present

## 2019-07-27 DIAGNOSIS — L308 Other specified dermatitis: Secondary | ICD-10-CM

## 2019-07-27 DIAGNOSIS — L84 Corns and callosities: Secondary | ICD-10-CM

## 2019-07-27 DIAGNOSIS — R0982 Postnasal drip: Secondary | ICD-10-CM

## 2019-07-27 DIAGNOSIS — M25531 Pain in right wrist: Secondary | ICD-10-CM

## 2019-07-27 DIAGNOSIS — L309 Dermatitis, unspecified: Secondary | ICD-10-CM

## 2019-07-27 MED ORDER — FLUTICASONE PROPIONATE 50 MCG/ACT NA SUSP: 1.0000 | Freq: Every day | NASAL | 1 refills | Status: DC | PRN

## 2019-07-27 MED ORDER — HYDROXYZINE HCL 25 MG PO TABS: 25.0000 mg | ORAL_TABLET | Freq: Two times a day (BID) | ORAL | 2 refills | Status: DC | PRN

## 2019-07-27 NOTE — Progress Notes (Signed)
Patient ID: [name], male    DOB: 1950-11-01  MRN: [id number]  CC: Referral   Subjective: [name] is a 69 y.o. 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  Patient last evaluated 02/2019.   C/o symptoms that he thinks may be due to mold exposure. -Symptoms include constant drainage at back of throat, intermittent rashes in various places over his body, burning and watering of the eyes, sneezing for several mths. Though it was pollen last yr but it has been ongoing even after pollen diet down . Endorses cough when he wakes up in mornings.  No SOB Discovered mold in his basement 7 mths ago.  Started having work done on it in August that involve pulling down the walls and baseboards.  The area has been treated for mold.  During the time that this work was being done at his house he was staying with his wife at her house since November of last year. -Symptoms better since being out of the house.  However wife's house has mold too but not as bad as it was in his.   -Prescribe some eyedrops last year by his ophthalmologist for eye irritation.  He is out of it and would like to get it refilled but does not recall the name.  It was sent to our pharmacy but pharmacy is closed already for the evening -Also requests refill on hydroxyzine which helps with some of his symptoms  Needs referral to podiatrist for DM foot care.  Reports having overgrown toenails and calluses that need shaving  Request wrist splint for RT wrist.  Gets pain and swelling in the wrists intermittently.  Was given a wrist splint for this in the past which he always found helpful but the splint is now worn and he needs a new one.  Patient Active Problem List   Diagnosis Date Noted  . Influenza vaccine needed 02/24/2019  . Diabetes mellitus ([place]) 02/24/2019   . Malignant neoplasm of prostate ([place]) 09/23/2018  . Hyperlipemia 11/24/2016  . History of CVA with residual deficit 11/24/2016  . GERD (gastroesophageal reflux disease) 08/09/2014  . Atrial fibrillation ([place])   . Hemiparesis affecting left side as late effect of stroke ([place]) 05/01/2014  . 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 DEGENERATIVE JOINT DISEASE, CERVICAL SPINE 04/24/2009  . DM2 (diabetes mellitus, type 2) ([place]) 07/03/2006  . Former smoker 07/03/2006  . HIATAL HERNIA WITH REFLUX 07/03/2006  . Essential hypertension 06/08/2006     Current Outpatient Medications on File Prior to Visit  Medication Sig Dispense Refill  . amLODipine (NORVASC) 10 MG tablet TAKE 1 TABLET BY MOUTH DAILY 30 tablet 3  . Blood Glucose Monitoring Suppl (TRUE METRIX METER) w/Device KIT Use as directed 1 kit 0  . diclofenac sodium (VOLTAREN) 1 % GEL Apply 2 g topically 4 (four) times daily. 100 g 1  . ELIQUIS 5 MG TABS tablet TAKE 1 TABLET BY MOUTH TWICE DAILY. 60 tablet 2  . GlucoCom Lancets MISC 100 Units by Does not apply route once. 100 each 0  . glucose blood (TRUE METRIX BLOOD GLUCOSE TEST) test strip Use as instructed 100 each 12  . hydrOXYzine (ATARAX/VISTARIL) 25 MG tablet Take 1 tablet (25 mg total) by mouth 2 (two) times daily as needed. 30 tablet 0  . levofloxacin (LEVAQUIN) 750 MG tablet     .  metoprolol tartrate (LOPRESSOR) 25 MG tablet Take 0.5 tablets (12.5 mg total) by mouth 2 (two) times daily. 90 tablet 3  . Multiple Vitamins-Minerals (MENS MULTIVITAMIN PLUS PO) Take 1 tablet by mouth daily.    . pravastatin (PRAVACHOL) 10 MG tablet Take 1 tablet (10 mg total) by mouth daily. 30 tablet 5   No current facility-administered medications on file prior to visit.    Allergies  Allergen Reactions  . Lisinopril Swelling    Social History   Socioeconomic History  . Marital status: Married    Spouse name: Not on file  . Number of children: 5  . Years of education: Not on file  .  Highest education level: Not on file  Occupational History  . Occupation: Caregiver    Comment: retired  Tobacco Use  . Smoking status: Former Smoker    Packs/day: 0.25    Years: 5.00    Pack years: 1.25    Types: Cigarettes    Quit date: 05/25/2014    Years since quitting: 5.1  . Smokeless tobacco: Never Used  . Tobacco comment: Quit 05/15/14  Substance and Sexual Activity  . Alcohol use: No    Alcohol/week: 0.0 standard drinks  . Drug use: No  . Sexual activity: Not Currently  Other Topics Concern  . Not on file  Social History Narrative   Lives with his wife   5 children, grown.   7 grandchildren.    Social Determinants of Health   Financial Resource Strain:   . Difficulty of Paying Living Expenses: Not on file  Food Insecurity:   . Worried About Charity fundraiser in the Last Year: Not on file  . Ran Out of Food in the Last Year: Not on file  Transportation Needs:   . Lack of Transportation (Medical): Not on file  . Lack of Transportation (Non-Medical): Not on file  Physical Activity:   . Days of Exercise per Week: Not on file  . Minutes of Exercise per Session: Not on file  Stress:   . Feeling of Stress : Not on file  Social Connections:   . Frequency of Communication with Friends and Family: Not on file  . Frequency of Social Gatherings with Friends and Family: Not on file  . Attends Religious Services: Not on file  . Active Member of Clubs or Organizations: Not on file  . Attends Archivist Meetings: Not on file  . Marital Status: Not on file  Intimate Partner Violence:   . Fear of Current or Ex-Partner: Not on file  . Emotionally Abused: Not on file  . Physically Abused: Not on file  . Sexually Abused: Not on file    Family History  Problem Relation Age of Onset  . Diabetes Mother   . Breast cancer Mother   . Diabetes Father   . Breast cancer Sister   . Prostate cancer Neg Hx   . Colon cancer Neg Hx   . Pancreatic cancer Neg Hx      Past Surgical History:  Procedure Laterality Date  . CATARACT EXTRACTION  06/2017  . CYSTOSCOPY  05/2017  . PROSTATE BIOPSY      ROS: Review of Systems Negative except as stated above  PHYSICAL EXAM: BP (!) 148/90   Pulse (!) 103   Temp 98 F (36.7 C)   Resp 16   Wt 193 lb 3.2 oz (87.6 kg)   SpO2 98%   BMI 25.49 kg/m   Wt Readings from Last  3 Encounters:  07/27/19 193 lb 3.2 oz (87.6 kg)  09/23/18 195 lb (88.5 kg)  06/09/18 200 lb 9.6 oz (91 kg)    Physical Exam General appearance - alert, well appearing, elderly African-American male and in no distress Mental status - normal mood, behavior, speech, dress, motor activity, and thought processes Eyes -no drainage or conjunctival injection noted at this time Nose -mild enlargement of nasal turbinates  mouth -throat is clear without exudate  chest - clear to auscultation, no wheezes, rales or rhonchi, symmetric air entry Heart - normal rate, regular rhythm, normal S1, S2, no murmurs, rubs, clicks or gallops MSK: Right wrist mild to moderate joint enlargement.  Mild tenderness over the distal radius Diabetic Foot Exam - Simple   Simple Foot Form Visual Inspection See comments: Yes Sensation Testing See comments: Yes Pulse Check Posterior Tibialis and Dorsalis pulse intact bilaterally: Yes Comments Patient with small callous on the medial aspect of both 1st MTP jt, and 3 cm nonulcerative callus on the plantar surface the base of the left fifth digit.  He has dry peeling skin between the toes.  Toenails on both big toes are discolored, thick and slightly overgrown.  He has decreased sensation on deep exam on certain areas of the plantar foot BL     CMP Latest Ref Rng & Units 03/01/2019 06/09/2018 11/09/2017  Glucose 65 - 99 mg/dL 95 128(H) 163(H)  BUN 8 - 27 mg/dL 15 21 17   Creatinine 0.76 - 1.27 mg/dL 1.30(H) 1.29(H) 1.21  Sodium 134 - 144 mmol/L 140 138 139  Potassium 3.5 - 5.2 mmol/L 4.5 4.5 4.3  Chloride 96 -  106 mmol/L 104 101 102  CO2 20 - 29 mmol/L 22 24 23   Calcium 8.6 - 10.2 mg/dL 9.8 9.6 9.8  Total Protein 6.0 - 8.5 g/dL 7.1 7.4 7.1  Total Bilirubin 0.0 - 1.2 mg/dL 0.2 0.3 0.3  Alkaline Phos 39 - 117 IU/L 78 78 77  AST 0 - 40 IU/L 14 13 11   ALT 0 - 44 IU/L 8 11 10    Lipid Panel     Component Value Date/Time   CHOL 191 03/01/2019 1524   TRIG 392 (H) 03/01/2019 1524   HDL 48 03/01/2019 1524   CHOLHDL 4.0 03/01/2019 1524   CHOLHDL 4.2 05/02/2014 0000   VLDL 72 (H) 05/02/2014 0000   LDLCALC 80 03/01/2019 1524    CBC    Component Value Date/Time   WBC 5.3 03/01/2019 1524   WBC 5.0 05/30/2014 1542   RBC 4.69 03/01/2019 1524   RBC 4.52 05/30/2014 1542   HGB 14.2 03/01/2019 1524   HCT 40.7 03/01/2019 1524   PLT 288 03/01/2019 1524   MCV 87 03/01/2019 1524   MCH 30.3 03/01/2019 1524   MCH 30.3 05/30/2014 1542   MCHC 34.9 03/01/2019 1524   MCHC 34.3 05/30/2014 1542   RDW 14.1 03/01/2019 1524   LYMPHSABS 1.7 05/01/2014 1946   MONOABS 0.3 05/01/2014 1946   EOSABS 0.0 05/01/2014 1946   BASOSABS 0.0 05/01/2014 1946    ASSESSMENT AND PLAN: 1. Mold exposure Advised patient that mold exposure can cause some of the symptoms and the best remedy is to remove oneself from the environment to have it treated.  Since his house has already been treated and there is mold in his ex-wife's house who he is staying with, I recommend that he may want to move back to his own house that has been treated. I recommend changing hydroxyzine to Claritin but  patient prefers to stay on the hydroxyzine.  I will also prescribe some Flonase nasal spray as he does seem to have some postnasal drip.  -He will call me back with the name of the eyedrops that his eye doctor had prescribed for him in the past for eye irritation so that I can send a prescription to the pharmacy for him -Patient requesting blood test to be checked for mold exposure - Allergen Profile, Mold; Future  2. Post-nasal drip See #1  above  3. Overgrown toenails 4. Callus of foot Podiatry referral submitted.  Good diabetic foot care discussed.  5. Right wrist pain We did not have a wrist splint in stock today.  Patient given a prescription to purchase one from a medical supply store.     Patient was given the opportunity to ask questions.  Patient verbalized understanding of the plan and was able to repeat key elements of the plan.   Orders Placed This Encounter  Procedures  . Allergen Profile, Mold     Requested Prescriptions    No prescriptions requested or ordered in this encounter    No follow-ups on file.  Karle Plumber, MD, FACP

## 2019-07-27 NOTE — Progress Notes (Signed)
Pt states there was mold found in his basement

## 2019-07-28 MED FILL — hydrOXYzine HCL 25 MG TABS: 25 | 30 days supply | Qty: 30 | Fill #0

## 2019-07-28 MED FILL — FLUTICASONE PROP 50 MCG SPR: 50 | 30 days supply | Qty: 16 | Fill #0

## 2019-08-01 ENCOUNTER — Ambulatory Visit (INDEPENDENT_AMBULATORY_CARE_PROVIDER_SITE_OTHER): Payer: Medicare HMO | Admitting: Podiatry

## 2019-08-01 ENCOUNTER — Ambulatory Visit: Payer: Medicare HMO | Attending: Internal Medicine

## 2019-08-01 ENCOUNTER — Other Ambulatory Visit: Payer: Self-pay

## 2019-08-01 ENCOUNTER — Encounter: Payer: Self-pay | Admitting: Podiatry

## 2019-08-01 DIAGNOSIS — Z77120 Contact with and (suspected) exposure to mold (toxic): Secondary | ICD-10-CM

## 2019-08-01 DIAGNOSIS — Q828 Other specified congenital malformations of skin: Secondary | ICD-10-CM | POA: Diagnosis not present

## 2019-08-01 DIAGNOSIS — E1149 Type 2 diabetes mellitus with other diabetic neurological complication: Secondary | ICD-10-CM | POA: Diagnosis not present

## 2019-08-01 DIAGNOSIS — B351 Tinea unguium: Secondary | ICD-10-CM | POA: Diagnosis not present

## 2019-08-01 DIAGNOSIS — M79674 Pain in right toe(s): Secondary | ICD-10-CM | POA: Diagnosis not present

## 2019-08-01 DIAGNOSIS — M79675 Pain in left toe(s): Secondary | ICD-10-CM

## 2019-08-01 NOTE — Progress Notes (Signed)
This patient presents to the office with chief complaint of long thick nails and diabetic feet.  This patient  says there  is  no pain and discomfort in his  feet.  This patient says there are long thick painful nails.  These nails are painful walking and wearing shoes. Patient also has painful callus both feet.  Patient has no history of infection or drainage from both feet.  Patient is unable to  self treat his own nails and his callus. . This patient presents  to the office today for treatment of the  long nails and callus  and a foot evaluation due to history of  diabetes.  General Appearance  Alert, conversant and in no acute stress.  Vascular  Dorsalis pedis and posterior tibial  pulses are palpable  bilaterally.  Capillary return is within normal limits  bilaterally. Temperature is within normal limits  bilaterally.  Neurologic  Senn-Weinstein monofilament wire test diminished   bilaterally. Muscle power within normal limits bilaterally.  Nails Thick disfigured discolored nails with subungual debris  from hallux to fifth toes bilaterally. No evidence of bacterial infection or drainage bilaterally.  Orthopedic  No limitations of motion of motion feet .  No crepitus or effusions noted.  No bony pathology or digital deformities noted.  Skin  normotropic skin  bilaterally.  No signs of infections or ulcers noted.   Porokeratosis sub 5th met  B/L.  Onychomycosis   Callus  B/L.  Diabetes with no foot complications  IE  Debride nails x 10.  Debride callus  B/L.   A diabetic foot exam was performed and there is no evidence of any vascular or neurologic pathology.   RTC 3 months.   Gardiner Barefoot DPM

## 2019-08-03 LAB — ALLERGEN PROFILE, MOLD
Alternaria Alternata IgE: <0.1 kU/L
Aspergillus Fumigatus IgE: <0.1 kU/L
Aureobasidi Pullulans IgE: <0.1 kU/L
Candida Albicans IgE: <0.1 kU/L
Cladosporium Herbarum IgE: <0.1 kU/L
M009-IgE Fusarium proliferatum: <0.1 kU/L
M014-IgE Epicoccum purpur: <0.1 kU/L
Mucor Racemosus IgE: <0.1 kU/L
Penicillium Chrysogen IgE: <0.1 kU/L
Phoma Betae IgE: <0.1 kU/L
Setomelanomma Rostrat: <0.1 kU/L
Stemphylium Herbarum IgE: <0.1 kU/L

## 2019-08-04 ENCOUNTER — Telehealth: Payer: Self-pay | Admitting: Internal Medicine

## 2019-08-04 NOTE — Telephone Encounter (Signed)
Patient called to get his results. Patient was informed of pcp note. Patient verbalized understanding and had no further questions.

## 2019-08-04 NOTE — Telephone Encounter (Signed)
Patient name and DOB has been verified Patient was informed of lab results. Patient had no questions.  

## 2019-08-09 MED FILL — ELIQUIS 5 MG TABLET: 5 | 30 days supply | Qty: 60 | Fill #1

## 2019-09-14 DIAGNOSIS — H524 Presbyopia: Secondary | ICD-10-CM | POA: Diagnosis not present

## 2019-10-06 ENCOUNTER — Ambulatory Visit: Payer: Medicare HMO | Admitting: Internal Medicine

## 2019-10-12 MED FILL — hydrOXYzine HCL 25 MG TABS: 25 | 30 days supply | Qty: 30 | Fill #1

## 2019-10-24 ENCOUNTER — Ambulatory Visit: Payer: Medicare HMO | Attending: Internal Medicine | Admitting: Internal Medicine

## 2019-10-24 ENCOUNTER — Encounter: Payer: Self-pay | Admitting: Internal Medicine

## 2019-10-24 ENCOUNTER — Other Ambulatory Visit: Payer: Self-pay

## 2019-10-24 VITALS — BP 139/91 | HR 66

## 2019-10-24 DIAGNOSIS — E1149 Type 2 diabetes mellitus with other diabetic neurological complication: Secondary | ICD-10-CM | POA: Diagnosis not present

## 2019-10-24 DIAGNOSIS — I10 Essential (primary) hypertension: Secondary | ICD-10-CM | POA: Diagnosis not present

## 2019-10-24 DIAGNOSIS — E785 Hyperlipidemia, unspecified: Secondary | ICD-10-CM | POA: Diagnosis not present

## 2019-10-24 DIAGNOSIS — E1169 Type 2 diabetes mellitus with other specified complication: Secondary | ICD-10-CM | POA: Diagnosis not present

## 2019-10-24 DIAGNOSIS — Z1211 Encounter for screening for malignant neoplasm of colon: Secondary | ICD-10-CM

## 2019-10-24 DIAGNOSIS — K529 Noninfective gastroenteritis and colitis, unspecified: Secondary | ICD-10-CM | POA: Diagnosis not present

## 2019-10-24 NOTE — Progress Notes (Signed)
Virtual Visit via Telephone Note Due to current restrictions/limitations of in-office visits due to the COVID-19 pandemic, this scheduled clinical appointment was converted to a telehealth visit  I connected with [name] on 10/24/19 at 11:20 a.m by telephone and verified that I am speaking with the correct person using two identifiers.   I discussed the limitations, risks, security and privacy concerns of performing an evaluation and management service by telephone and the availability of in person appointments. I also discussed with the patient that there may be a patient responsible charge related to this service. The patient expressed understanding and agreed to proceed.   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  Last evaluated 07/2019 which was televisit.  Purpose of today's visit is chronic disease management.  Last seen in person 05/2018. Reports his jeep sometimes does not work  Some swelling in RT wrist but not as frequent as it has been.  Using his splint.  Prostate CA: last saw urology last summer.  He was waiting to complete COVID-19 vaccine before f/u. He has completed so plans to call and schedule an appt with his urologist.  DM: checks BS Q 3 days. Reports readings have been good.  BS this a.m was 105.  Avg 105-110.  Highest is 140.  Weakness is banana pudding. Reports having some diarrhea x 2 days.  He attributes this to eating tamales.  Took some Imodium D this a.m and diarrhea has stopped.  No blood in stools.  No fever   HTN: compliant with Norvasc and Metoprolol and salt restriction BP generally 130/80s.  Today was 139/91, P 66 No CP, SOB, LE edema  HL:  Taking and tolerating Pravachol  HM: due for colon CA screen.  He was able to give me the dates of the [employer] vaccine that he received.  Dates are 07/29/2019 and 08/21/2019.    Outpatient Encounter Medications as of 10/24/2019  Medication Sig  . amLODipine (NORVASC) 10 MG tablet TAKE 1 TABLET BY MOUTH DAILY  . Blood Glucose Monitoring Suppl (TRUE METRIX METER) w/Device KIT Use as directed  . diclofenac Sodium (VOLTAREN) 1 % GEL APPLY 2 GRAMS TOPICALLY FOUR TIMES DAILY  . ELIQUIS 5 MG TABS tablet TAKE 1 TABLET BY MOUTH TWICE DAILY.  . fluticasone (FLONASE) 50 MCG/ACT nasal spray Place 1 spray into both nostrils daily as needed for allergies or rhinitis.  . GlucoCom Lancets MISC 100 Units by Does not apply route once.  Marland Kitchen glucose blood (TRUE METRIX BLOOD GLUCOSE TEST) test strip Use as instructed  . hydrOXYzine (ATARAX/VISTARIL) 25 MG tablet Take 1 tablet (25 mg total) by mouth 2 (two) times daily as needed.  Marland Kitchen levofloxacin (LEVAQUIN) 750 MG tablet   . metoprolol tartrate (LOPRESSOR) 25 MG tablet Take 0.5 tablets (12.5 mg total) by mouth 2 (two) times daily.  . Multiple Vitamins-Minerals (MENS MULTIVITAMIN PLUS PO) Take 1 tablet by mouth daily.  . pravastatin (PRAVACHOL) 10 MG tablet Take 1 tablet (10 mg total) by mouth daily.   No facility-administered encounter medications on file as of 10/24/2019.    Observations/Objective: Lab Results  Component Value Date   CHOL 191 03/01/2019   HDL 48 03/01/2019   LDLCALC 80 03/01/2019   TRIG 392 (H) 03/01/2019   CHOLHDL 4.0 03/01/2019     Chemistry      Component Value Date/Time   NA 140 03/01/2019 1524  K 4.5 03/01/2019 1524   CL 104 03/01/2019 1524   CO2 22 03/01/2019 1524   BUN 15 03/01/2019 1524   CREATININE 1.30 (H) 03/01/2019 1524      Component Value Date/Time   CALCIUM 9.8 03/01/2019 1524   ALKPHOS 78 03/01/2019 1524   AST 14 03/01/2019 1524   ALT 8 03/01/2019 1524   BILITOT 0.2 03/01/2019 1524       Assessment and Plan: 1. Type 2 diabetes mellitus with other neurologic complication, without long-term current use of insulin (HCC) Reported blood sugars are at goal.  Continue healthy eating  habits. - Hemoglobin A1c; Future  2. Essential hypertension Systolic and diastolic on right running slightly above goal of 130/80 or lower.  We discussed adding another blood pressure medication but patient is not in favor of this.  He will continue amlodipine and metoprolol and DASH diet. - Basic Metabolic Panel; Future  3. Gastroenteritis Resolving.  Advised to push fluids  4. Hyperlipidemia associated with type 2 diabetes mellitus (HCC) Continue Pravachol.  5. Screening for colon cancer Discussed colon cancer screening.  He is agreeable to doing the Cologuard test. - Cologuard   Follow Up Instructions: 3 months in person   I discussed the assessment and treatment plan with the patient. The patient was provided an opportunity to ask questions and all were answered. The patient agreed with the plan and demonstrated an understanding of the instructions.   The patient was advised to call back or seek an in-person evaluation if the symptoms worsen or if the condition fails to improve as anticipated.  I provided 19 minutes of non-face-to-face time during this encounter.   Karle Plumber, MD

## 2019-10-24 NOTE — Progress Notes (Signed)
Pt states he has diarrhea for 2 days    Pt states his blood sugar was 105

## 2019-10-31 ENCOUNTER — Ambulatory Visit (INDEPENDENT_AMBULATORY_CARE_PROVIDER_SITE_OTHER): Payer: Medicare HMO | Admitting: Podiatry

## 2019-10-31 ENCOUNTER — Other Ambulatory Visit: Payer: Self-pay

## 2019-10-31 ENCOUNTER — Encounter: Payer: Self-pay | Admitting: Podiatry

## 2019-10-31 VITALS — Temp 97.2°F

## 2019-10-31 DIAGNOSIS — M79675 Pain in left toe(s): Secondary | ICD-10-CM

## 2019-10-31 DIAGNOSIS — E1149 Type 2 diabetes mellitus with other diabetic neurological complication: Secondary | ICD-10-CM

## 2019-10-31 DIAGNOSIS — Q828 Other specified congenital malformations of skin: Secondary | ICD-10-CM | POA: Diagnosis not present

## 2019-10-31 DIAGNOSIS — B351 Tinea unguium: Secondary | ICD-10-CM

## 2019-10-31 DIAGNOSIS — M79674 Pain in right toe(s): Secondary | ICD-10-CM

## 2019-10-31 NOTE — Progress Notes (Signed)
This patient returns to my office for at risk foot care.  This patient requires this care by a professional since this patient will be at risk due to having diabetes.  This patient is unable to cut nails himself since the patient cannot reach his nails.These nails are painful walking and wearing shoes.  This patient presents for at risk foot care today.  General Appearance  Alert, conversant and in no acute stress.  Vascular  Dorsalis pedis and posterior tibial  pulses are palpable  bilaterally.  Capillary return is within normal limits  bilaterally. Temperature is within normal limits  bilaterally.  Neurologic  Senn-Weinstein monofilament wire test within normal limits  bilaterally. Muscle power within normal limits bilaterally.  Nails Thick disfigured discolored nails with subungual debris  from hallux to fifth toes bilaterally. No evidence of bacterial infection or drainage bilaterally.  Orthopedic  No limitations of motion  feet .  No crepitus or effusions noted.  No bony pathology or digital deformities noted.  Skin  normotropic skin with no porokeratosis noted bilaterally.  No signs of infections or ulcers noted.     Onychomycosis  Pain in right toes  Pain in left toes  Porokeratosis sub5th met  B/L.  Consent was obtained for treatment procedures.   Mechanical debridement of nails 1-5  bilaterally performed with a nail nipper.  Filed with dremel without incident.  Debride porokeratosis using a # 15 blade.   Return office visit   3 months                  Told patient to return for periodic foot care and evaluation due to potential at risk complications.   Gardiner Barefoot DPM

## 2019-11-10 ENCOUNTER — Ambulatory Visit: Payer: Medicare HMO | Admitting: Pharmacist

## 2019-11-10 ENCOUNTER — Encounter: Payer: Medicare HMO | Admitting: Pharmacist

## 2019-11-13 ENCOUNTER — Ambulatory Visit: Payer: Medicare HMO | Attending: Internal Medicine | Admitting: Pharmacist

## 2019-11-13 ENCOUNTER — Encounter: Payer: Self-pay | Admitting: Pharmacist

## 2019-11-13 ENCOUNTER — Other Ambulatory Visit: Payer: Self-pay

## 2019-11-13 VITALS — BP 144/84 | HR 76 | Temp 98.6°F | Ht 73.0 in | Wt 187.2 lb

## 2019-11-13 DIAGNOSIS — H524 Presbyopia: Secondary | ICD-10-CM | POA: Diagnosis not present

## 2019-11-13 DIAGNOSIS — Z0000 Encounter for general adult medical examination without abnormal findings: Secondary | ICD-10-CM | POA: Diagnosis not present

## 2019-11-13 DIAGNOSIS — E119 Type 2 diabetes mellitus without complications: Secondary | ICD-10-CM | POA: Diagnosis not present

## 2019-11-13 DIAGNOSIS — Z961 Presence of intraocular lens: Secondary | ICD-10-CM | POA: Diagnosis not present

## 2019-11-13 NOTE — Progress Notes (Signed)
Subjective:   [name] is a 69 y.o. male who presents for Medicare Annual/Subsequent preventive examination. Pt with CC of R wrist pain. Requests hydrocodone for this. Requests dermatology referral for a mole on his R lower leg. No other complaints. Reports that he is doing well overall and is in good spirits.  Objective:    Vitals: BP (!) 144/84   Pulse 76   Temp 98.6 F (37 C)   Ht 6' 1"  (1.854 m)   Wt 187 lb 3.2 oz (84.9 kg)   BMI 24.70 kg/m   Body mass index is 24.7 kg/m.  Advanced Directives 11/13/2019 09/04/2017 04/19/2017 03/23/2017 12/04/2016 11/24/2016 10/01/2014  Does Patient Have a Medical Advance Directive? No No No No No No No  Would patient like information on creating a medical advance directive? No - Patient declined No - Patient declined No - Patient declined - No - Patient declined - No - patient declined information   Tobacco Social History   Tobacco Use  Smoking Status Former Smoker  . Packs/day: 0.25  . Years: 5.00  . Pack years: 1.25  . Types: Cigarettes  . Quit date: 05/25/2014  . Years since quitting: 5.4  Smokeless Tobacco Never Used  Tobacco Comment   Quit 05/15/14     Counseling given: Not Answered Comment: Quit 05/15/14  Clinical Intake: Pre-visit preparation completed: Yes  Pain : 0-10 Pain Type: Chronic pain Pain Location: Wrist Pain Orientation: Right  BMI - recorded: 24.7 Nutritional Status: BMI of 19-24  Normal Diabetes: Yes CBG done?: No Did pt. bring in CBG monitor from home?: No  How often do you need to have someone help you when you read instructions, pamphlets, or other written materials from your doctor or pharmacy?: 1 - Never What is the last grade level you completed in school?: 2 years of college  Interpreter Needed?: No  Past Medical History:  Diagnosis Date  . Abscess of anal and rectal regions 07/15/2007   Qualifier: Diagnosis of  By: Amil Amen MD, Benjamine Mola    . Atrial fibrillation ([place])   . Diabetes  mellitus without complication ([place]) Dx 6754  . FISTULA, ANAL 01/27/2007   Qualifier: Diagnosis of  By: Amil Amen MD, Benjamine Mola    . Hypertension Dx 2008  . Prostate cancer ([place])   . Stroke [facility]    Past Surgical History:  Procedure Laterality Date  . CATARACT EXTRACTION  06/2017  . CYSTOSCOPY  05/2017  . PROSTATE BIOPSY     Family History  Problem Relation Age of Onset  . Diabetes Mother   . Breast cancer Mother   . Diabetes Father   . Breast cancer Sister   . Prostate cancer Neg Hx   . Colon cancer Neg Hx   . Pancreatic cancer Neg Hx    Social History   Socioeconomic History  . Marital status: Married    Spouse name: Not on file  . Number of children: 5  . Years of education: Not on file  . Highest education level: Not on file  Occupational History  . Occupation: Caregiver    Comment: retired  Tobacco Use  . Smoking status: Former Smoker    Packs/day: 0.25    Years: 5.00    Pack years: 1.25    Types: Cigarettes    Quit date: 05/25/2014    Years since quitting: 5.4  . Smokeless tobacco: Never Used  . Tobacco comment: Quit 05/15/14  Substance and Sexual Activity  . Alcohol use: No  Alcohol/week: 0.0 standard drinks  . Drug use: No  . Sexual activity: Not Currently  Other Topics Concern  . Not on file  Social History Narrative   Lives with his wife   5 children, grown.   7 grandchildren.    Social Determinants of Health   Financial Resource Strain:   . Difficulty of Paying Living Expenses:   Food Insecurity:   . Worried About Charity fundraiser in the Last Year:   . Arboriculturist in the Last Year:   Transportation Needs:   . Film/video editor (Medical):   Marland Kitchen Lack of Transportation (Non-Medical):   Physical Activity:   . Days of Exercise per Week:   . Minutes of Exercise per Session:   Stress:   . Feeling of Stress :   Social Connections:   . Frequency of Communication with Friends and Family:   . Frequency of Social Gatherings with Friends  and Family:   . Attends Religious Services:   . Active Member of Clubs or Organizations:   . Attends Archivist Meetings:   Marland Kitchen Marital Status:     Outpatient Encounter Medications as of 11/13/2019  Medication Sig  . amLODipine (NORVASC) 10 MG tablet TAKE 1 TABLET BY MOUTH DAILY  . Blood Glucose Monitoring Suppl (TRUE METRIX METER) w/Device KIT Use as directed  . diclofenac Sodium (VOLTAREN) 1 % GEL APPLY 2 GRAMS TOPICALLY FOUR TIMES DAILY  . ELIQUIS 5 MG TABS tablet TAKE 1 TABLET BY MOUTH TWICE DAILY.  . fluticasone (FLONASE) 50 MCG/ACT nasal spray Place 1 spray into both nostrils daily as needed for allergies or rhinitis.  . GlucoCom Lancets MISC 100 Units by Does not apply route once.  Marland Kitchen glucose blood (TRUE METRIX BLOOD GLUCOSE TEST) test strip Use as instructed  . hydrOXYzine (ATARAX/VISTARIL) 25 MG tablet Take 1 tablet (25 mg total) by mouth 2 (two) times daily as needed.  . metoprolol tartrate (LOPRESSOR) 25 MG tablet Take 0.5 tablets (12.5 mg total) by mouth 2 (two) times daily.  . Multiple Vitamins-Minerals (MENS MULTIVITAMIN PLUS PO) Take 1 tablet by mouth daily.  . pravastatin (PRAVACHOL) 10 MG tablet Take 1 tablet (10 mg total) by mouth daily.   No facility-administered encounter medications on file as of 11/13/2019.    Activities of Daily Living In your present state of health, do you have any difficulty performing the following activities: 11/13/2019  Hearing? N  Vision? N  Difficulty concentrating or making decisions? N  Walking or climbing stairs? N  Dressing or bathing? N  Doing errands, shopping? Y  Preparing Food and eating ? N  Using the Toilet? N  In the past six months, have you accidently leaked urine? N  Do you have problems with loss of bowel control? N  Managing your Medications? N  Managing your Finances? N  Housekeeping or managing your Housekeeping? N  Some recent data might be hidden    Patient Care Team: [name], MD as PCP -  General (Internal Medicine) Cira Rue, RN Nurse Navigator as Registered Nurse (Medical Oncology)   Assessment:   This is a routine wellness examination for [name].  Exercise Activities and Dietary recommendations Current Exercise Habits: The patient does not participate in regular exercise at present, Exercise limited by: orthopedic condition(s). I will send a message to his PCP regarding request for hydrocodone.   Goals    . Blood Pressure < 140/90    . HEMOGLOBIN A1C < 7.0  Fall Risk Fall Risk  11/13/2019 07/27/2019 03/23/2017 10/16/2014 08/09/2014  Falls in the past year? 1 0 No No No  Number falls in past yr: 1 - - - -  Injury with Fall? 1 - - - -  Comment Minor injury; no head injury of LOC - - - -  Risk for fall due to : History of fall(s);Impaired mobility;Impaired vision - - - -  Follow up Falls evaluation completed;Education provided;Falls prevention discussed - - - -   Is the patient's home free of loose throw rugs in walkways, pet beds, electrical cords, etc?   yes      Grab bars in the bathroom? yes      Handrails on the stairs?   yes      Adequate lighting?   yes  Timed Get Up and Go Performed: <5 seconds  Depression Screen PHQ 2/9 Scores 11/13/2019 10/24/2019 06/09/2018 02/08/2018  PHQ - 2 Score 0 0 0 0  PHQ- 9 Score - - - -   Cognitive Function MMSE - Mini Mental State Exam 11/13/2019  Orientation to time 5  Orientation to Place 5  Registration 3  Attention/ Calculation 5  Recall 3  Language- name 2 objects 2  Language- repeat 1  Language- follow 3 step command 3  Language- read & follow direction 1  Write a sentence 1  Copy design 1  Total score 30   Immunization History  Administered Date(s) Administered  . Influenza Whole 03/27/2009, 07/17/2010  . Influenza,inj,Quad PF,6+ Mos 06/09/2018, 03/01/2019  . PFIZER SARS-COV-2 Vaccination 07/29/2019, 08/21/2019  . Pneumococcal Conjugate-13 03/23/2017  . Pneumococcal Polysaccharide-23 07/17/2010, 06/09/2018   . Td 06/22/2005   Qualifies for Shingles Vaccine? yes  Screening Tests Health Maintenance  Topic Date Due  . COLONOSCOPY  Never done  . TETANUS/TDAP  06/23/2015  . URINE MICROALBUMIN  11/10/2018  . HEMOGLOBIN A1C  08/29/2019  . INFLUENZA VACCINE  01/21/2020  . FOOT EXAM  07/26/2020  . OPHTHALMOLOGY EXAM  11/12/2020  . COVID-19 Vaccine  Completed  . Hepatitis C Screening  Completed  . PNA vac Low Risk Adult  Completed   Cancer Screenings: Lung: Low Dose CT Chest recommended if Age 48-80 years, 30 pack-year currently smoking OR have quit w/in 15years. Patient does qualify. Colorectal: not completed; pt does have Cologaurd materials.   Additional Screenings:  Hepatitis C Screening: not done; order placed     Plan:  [name] was seen today for his Medicare Wellness Visit.   I have personally reviewed and noted the following in the patient's chart:   . Medical and social history . Use of alcohol, tobacco or illicit drugs  . Current medications and supplements . Functional ability and status . Nutritional status . Physical activity . Advanced directives . List of other physicians . Hospitalizations, surgeries, and ER visits in previous 12 months . Vitals . Screenings to include cognitive, depression, and falls . Referrals and appointments  In addition, I have reviewed and discussed with patient certain preventive protocols, quality metrics, and best practice recommendations. A written personalized care plan for preventive services as well as general preventive health recommendations were provided to patient.  The following were addressed: - Hep C screening: ordered  - Colonoscopy: pt has Cologuard materials. Encouraged pt to send his sample in.  - Urine microalbumin, A1c orders are due. These were ordered previously. Pt must complete these.  - Tetanus vaccine due. Pt encouraged to obtain this at his next PCP appointment.  Tresa Endo,  RPH-CPP  11/14/2019

## 2019-11-14 ENCOUNTER — Other Ambulatory Visit: Payer: Self-pay | Admitting: Internal Medicine

## 2019-11-14 MED ORDER — AMLODIPINE BESYLATE 10 MG PO TABS: 10.0000 mg | ORAL_TABLET | Freq: Every day | ORAL | 1 refills | Status: DC

## 2019-11-14 MED FILL — AMLODIPINE BESYLATE 10 MG T: 10 | 90 days supply | Qty: 90 | Fill #0

## 2019-11-15 MED FILL — hydrOXYzine HCL 25 MG TABS: 25 | 15 days supply | Qty: 30 | Fill #2

## 2019-11-17 ENCOUNTER — Telehealth: Payer: Self-pay | Admitting: Internal Medicine

## 2019-11-17 DIAGNOSIS — D229 Melanocytic nevi, unspecified: Secondary | ICD-10-CM

## 2019-11-17 MED FILL — ELIQUIS 5 MG TABLET: 5 | 30 days supply | Qty: 60 | Fill #2

## 2019-11-17 NOTE — Telephone Encounter (Signed)
-----   Message from Tresa Endo, [place] sent at 11/14/2019 12:11 PM EDT ----- Hey Dr. [name] -   A few findings during my AWV with Mr. Addie:  - Hep C screening due - I placed that order. - He saw his eye doctor yesterday as well. I updated his HM to reflect this.  - Colonoscopy due: pt encouraged to send in Cologuard sample. He has supplies from [facility].  - UTD on vaccines except for Tdap. He wanted to defer this until seeing you.   He did request hydrocodone for R wrist pain. I'm not sure if you order this for him but I told him I would make you aware of his request.   He requested a Derm referral for a mole on his R lower leg.   Thanks for including me,   Lurena Joiner

## 2020-01-03 ENCOUNTER — Telehealth: Payer: Self-pay | Admitting: Internal Medicine

## 2020-01-08 ENCOUNTER — Other Ambulatory Visit: Payer: Self-pay | Admitting: Internal Medicine

## 2020-01-08 DIAGNOSIS — L309 Dermatitis, unspecified: Secondary | ICD-10-CM

## 2020-01-08 MED FILL — hydrOXYzine HCL 25 MG TABS: 25 | 15 days supply | Qty: 30 | Fill #0

## 2020-01-11 NOTE — Telephone Encounter (Signed)
Pt vm is full is unable to lvm

## 2020-01-29 ENCOUNTER — Encounter: Payer: Self-pay | Admitting: Internal Medicine

## 2020-01-29 ENCOUNTER — Other Ambulatory Visit: Payer: Self-pay

## 2020-01-29 ENCOUNTER — Ambulatory Visit: Payer: Medicare HMO | Attending: Internal Medicine | Admitting: Internal Medicine

## 2020-01-29 VITALS — BP 132/80 | HR 71 | Resp 17 | Wt 183.6 lb

## 2020-01-29 DIAGNOSIS — I10 Essential (primary) hypertension: Secondary | ICD-10-CM | POA: Diagnosis not present

## 2020-01-29 DIAGNOSIS — E1169 Type 2 diabetes mellitus with other specified complication: Secondary | ICD-10-CM

## 2020-01-29 DIAGNOSIS — C61 Malignant neoplasm of prostate: Secondary | ICD-10-CM

## 2020-01-29 DIAGNOSIS — Z1159 Encounter for screening for other viral diseases: Secondary | ICD-10-CM

## 2020-01-29 DIAGNOSIS — E1159 Type 2 diabetes mellitus with other circulatory complications: Secondary | ICD-10-CM | POA: Diagnosis not present

## 2020-01-29 DIAGNOSIS — R634 Abnormal weight loss: Secondary | ICD-10-CM | POA: Insufficient documentation

## 2020-01-29 DIAGNOSIS — Z9181 History of falling: Secondary | ICD-10-CM | POA: Insufficient documentation

## 2020-01-29 DIAGNOSIS — I69354 Hemiplegia and hemiparesis following cerebral infarction affecting left non-dominant side: Secondary | ICD-10-CM

## 2020-01-29 DIAGNOSIS — Z6379 Other stressful life events affecting family and household: Secondary | ICD-10-CM

## 2020-01-29 DIAGNOSIS — E785 Hyperlipidemia, unspecified: Secondary | ICD-10-CM

## 2020-01-29 DIAGNOSIS — Z23 Encounter for immunization: Secondary | ICD-10-CM

## 2020-01-29 DIAGNOSIS — Z1211 Encounter for screening for malignant neoplasm of colon: Secondary | ICD-10-CM | POA: Diagnosis not present

## 2020-01-29 DIAGNOSIS — M19131 Post-traumatic osteoarthritis, right wrist: Secondary | ICD-10-CM

## 2020-01-29 DIAGNOSIS — I480 Paroxysmal atrial fibrillation: Secondary | ICD-10-CM | POA: Diagnosis not present

## 2020-01-29 LAB — POCT GLYCOSYLATED HEMOGLOBIN (HGB A1C): HbA1c, POC (controlled diabetic range): 6.6 % (ref 0.0–7.0)

## 2020-01-29 LAB — GLUCOSE, POCT (MANUAL RESULT ENTRY): POC Glucose: 135 mg/dl — AB (ref 70–99)

## 2020-01-29 MED ORDER — TRAMADOL HCL 50 MG PO TABS: 50.0000 mg | ORAL_TABLET | Freq: Every day | ORAL | 0 refills | Status: AC | PRN

## 2020-01-29 MED FILL — traMADol HCL 50 MG TABS: 50 | 5 days supply | Qty: 5 | Fill #0

## 2020-01-29 NOTE — Progress Notes (Signed)
Patient ID: IAM LIPSON, male    DOB: [birth year]  MRN: [id number]  CC: Diabetes and Hypertension   Subjective: [name] is a 69 y.o. 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   RT wrist has been giving problem.  Has hx of fracture and arthritis in this wrist.  Last imaging study was done 08/2017 which revealed chronic degenerative and posttraumatic changes.   -The amount of pain that he gets in the wrist depends on how much he uses his cane.  He uses the Voltaren gel and also takes extra strength Tylenol sometimes 3 times a day when it flares up with partial relief.  He would like to have something stronger to keep on hand to use when needed.  Uses the cane out in public but not so much at home.  Has objects that he holds on to when walking in the house.   -fell at home 3 x this yr.  Last was 3 mths ago.  Fell while in shower while trying to get up out of the shower chair.  Subsequently he got a shower bar.  -the other 2 falls "were a gentle let down." Meaning he knew he was about to fall and was able to grab something.  "I got rid of some rugs too" "causing him to have near falls -has 2 roller walkers but prefers to use his cane.  He has weakness in his left grip and left leg from previous CVA  Prostate CA: still has not followed up with the urologist since we last spoke.  States when he was about to make a f/u appt, there was an up tick in [place] variant cases so he decided to hold off. -reports no issues with urine stream and no increase frequency.  No hematuria.  DM:  Checking BS 3 x a wk.  Range in the low 100s. Reports poor appetite and I note he has lost 10 pounds since February of this year.  He attributes this to some life stresses.  He has been trying to reconcile with his wife and feeling very  lonely.  Stays active by volunteering and exercising at the [facility] 4 days a wk.  He also teaches Sunday school.  Exercise and prayer helps him cope with stress.  Also reports that he got dentures in February and it is taking him a while to get used to them and is finding that there are certain foods he cannot eat with dentures. -He denies any chronic cough.  No fever or night sweats.  HTN: Reports compliance with medications and low-salt diet.  No chest pains or shortness of breath.  No lower extremity edema.  HM: He was sent a Cologuard kit but has not used it on returning as yet..   Patient Active Problem List   Diagnosis Date Noted  . Pain due to onychomycosis of toenails of both feet 08/01/2019  . Porokeratosis 08/01/2019  . Influenza vaccine needed 02/24/2019  . Diabetes mellitus ([place]) 02/24/2019  . Malignant neoplasm of prostate ([place]) 09/23/2018  . Hyperlipemia 11/24/2016  . History of CVA with residual deficit 11/24/2016  . GERD (gastroesophageal reflux disease) 08/09/2014  . Atrial fibrillation ([place])   . Hemiparesis affecting left side as late effect of stroke ([place]) 05/01/2014  . 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 DEGENERATIVE JOINT DISEASE, CERVICAL  SPINE 04/24/2009  . DM2 (diabetes mellitus, type 2) ([place]) 07/03/2006  . Former smoker 07/03/2006  . HIATAL HERNIA WITH REFLUX 07/03/2006  . Essential hypertension 06/08/2006     Current Outpatient Medications on File Prior to Visit  Medication Sig Dispense Refill  . amLODipine (NORVASC) 10 MG tablet Take 1 tablet (10 mg total) by mouth daily. 90 tablet 1  . Blood Glucose Monitoring Suppl (TRUE METRIX METER) w/Device KIT Use as directed 1 kit 0  . diclofenac Sodium (VOLTAREN) 1 % GEL APPLY 2 GRAMS TOPICALLY FOUR TIMES DAILY    . ELIQUIS 5 MG TABS tablet TAKE 1 TABLET BY MOUTH TWICE DAILY. 60 tablet 2  . fluticasone (FLONASE) 50 MCG/ACT nasal spray Place 1 spray into both nostrils daily as needed for allergies or rhinitis.  16 g 1  . GlucoCom Lancets MISC 100 Units by Does not apply route once. 100 each 0  . glucose blood (TRUE METRIX BLOOD GLUCOSE TEST) test strip Use as instructed 100 each 12  . hydrOXYzine (ATARAX/VISTARIL) 25 MG tablet TAKE 1 TABLET (25 MG TOTAL) BY MOUTH 2 (TWO) TIMES DAILY AS NEEDED. 30 tablet 2  . Multiple Vitamins-Minerals (MENS MULTIVITAMIN PLUS PO) Take 1 tablet by mouth daily.    . pravastatin (PRAVACHOL) 10 MG tablet Take 1 tablet (10 mg total) by mouth daily. 30 tablet 5  . metoprolol tartrate (LOPRESSOR) 25 MG tablet Take 0.5 tablets (12.5 mg total) by mouth 2 (two) times daily. (Patient not taking: Reported on 01/29/2020) 90 tablet 3   No current facility-administered medications on file prior to visit.    Allergies  Allergen Reactions  . Lisinopril Swelling    Social History   Socioeconomic History  . Marital status: Married    Spouse name: Not on file  . Number of children: 5  . Years of education: Not on file  . Highest education level: Not on file  Occupational History  . Occupation: Caregiver    Comment: retired  Tobacco Use  . Smoking status: Former Smoker    Packs/day: 0.25    Years: 5.00    Pack years: 1.25    Types: Cigarettes    Quit date: 05/25/2014    Years since quitting: 5.6  . Smokeless tobacco: Never Used  . Tobacco comment: Quit 05/15/14  Vaping Use  . Vaping Use: Never used  Substance and Sexual Activity  . Alcohol use: No    Alcohol/week: 0.0 standard drinks  . Drug use: No  . Sexual activity: Not Currently  Other Topics Concern  . Not on file  Social History Narrative   Lives with his wife   5 children, grown.   7 grandchildren.    Social Determinants of Health   Financial Resource Strain:   . Difficulty of Paying Living Expenses:   Food Insecurity:   . Worried About Charity fundraiser in the Last Year:   . Arboriculturist in the Last Year:   Transportation Needs:   . Film/video editor (Medical):   Marland Kitchen Lack of Transportation  (Non-Medical):   Physical Activity:   . Days of Exercise per Week:   . Minutes of Exercise per Session:   Stress:   . Feeling of Stress :   Social Connections:   . Frequency of Communication with Friends and Family:   . Frequency of Social Gatherings with Friends and Family:   . Attends Religious Services:   . Active Member of Clubs or Organizations:   . Attends  Club or Organization Meetings:   Marland Kitchen Marital Status:   Intimate Partner Violence:   . Fear of Current or Ex-Partner:   . Emotionally Abused:   Marland Kitchen Physically Abused:   . Sexually Abused:     Family History  Problem Relation Age of Onset  . Diabetes Mother   . Breast cancer Mother   . Diabetes Father   . Breast cancer Sister   . Prostate cancer Neg Hx   . Colon cancer Neg Hx   . Pancreatic cancer Neg Hx     Past Surgical History:  Procedure Laterality Date  . CATARACT EXTRACTION  06/2017  . CYSTOSCOPY  05/2017  . PROSTATE BIOPSY      ROS: Review of Systems Negative except as stated above  PHYSICAL EXAM: BP 132/80   Pulse 71   Resp 17   Wt 183 lb 9.6 oz (83.3 kg)   SpO2 97%   BMI 24.22 kg/m   Wt Readings from Last 3 Encounters:  01/29/20 183 lb 9.6 oz (83.3 kg)  11/13/19 187 lb 3.2 oz (84.9 kg)  07/27/19 193 lb 3.2 oz (87.6 kg)    Physical Exam  General appearance - alert, well appearing, pleasant elderly African-American male and in no distress Mental status - normal mood, behavior, speech, dress, motor activity, and thought processes Eyes - pupils equal and reactive, extraocular eye movements intact Nose - normal and patent, no erythema, discharge or polyps Neck - supple, no significant neck masses or thyroid enlargement Lymphatics - no palpable lymphadenopathy, no hepatosplenomegaly Chest - clear to auscultation, no wheezes, rales or rhonchi, symmetric air entry Heart - normal rate, regular rhythm, normal S1, S2, no murmurs, rubs, clicks or gallops Abdomen - soft, nontender, nondistended, no  masses or organomegaly Extremities - peripheral pulses normal, no pedal edema, no clubbing or cyanosis MSK: Right wrist -mild edema with enlargement of the radial aspect.  Slight tenderness on palpation.  No erythema.  Slowed range of motion. Neurologic: Grip 4+/5 bilaterally.  Power in the upper extremities 4+/5 bilaterally.  Power in the lower extremity 4/5 proximally and distally on the left and 5/5 proximally and distally on the right.  He ambulates with a cane.  Gait is slow but steady Depression screen [facility] 2/9 01/29/2020 11/13/2019 10/24/2019  Decreased Interest 0 0 0  Down, Depressed, Hopeless 0 0 0  PHQ - 2 Score 0 0 0  Altered sleeping - - -  Tired, decreased energy - - -  Change in appetite - - -  Feeling bad or failure about yourself  - - -  Trouble concentrating - - -  Moving slowly or fidgety/restless - - -  Suicidal thoughts - - -  PHQ-9 Score - - -   Results for orders placed or performed in visit on 01/29/20  POCT glucose (manual entry)  Result Value Ref Range   POC Glucose 135 (A) 70 - 99 mg/dl  POCT glycosylated hemoglobin (Hb A1C)  Result Value Ref Range   Hemoglobin A1C     HbA1c POC (<> result, manual entry)     HbA1c, POC (prediabetic range)     HbA1c, POC (controlled diabetic range) 6.6 0.0 - 7.0 %    CMP Latest Ref Rng & Units 03/01/2019 06/09/2018 11/09/2017  Glucose 65 - 99 mg/dL 95 128(H) 163(H)  BUN 8 - 27 mg/dL _0 Creatinine 0.76 - 1.27 mg/dL 1.30(H) 1.29(H) 1.21  Sodium 134 - 144 mmol/L 140 138 139  Potassium 3.5 - 5.2  mmol/L 4.5 4.5 4.3  Chloride 96 - 106 mmol/L 104 101 102  CO2 20 - 29 mmol/L _0 Calcium 8.6 - 10.2 mg/dL 9.8 9.6 9.8  Total Protein 6.0 - 8.5 g/dL 7.1 7.4 7.1  Total Bilirubin 0.0 - 1.2 mg/dL 0.2 0.3 0.3  Alkaline Phos 39 - 117 IU/L 78 78 77  AST 0 - 40 IU/L _1 ALT 0 - 44 IU/L _2 Lipid Panel     Component Value Date/Time   CHOL 191 03/01/2019 1524   TRIG 392 (H) 03/01/2019 1524   HDL 48 03/01/2019 1524    CHOLHDL 4.0 03/01/2019 1524   CHOLHDL 4.2 05/02/2014 0000   VLDL 72 (H) 05/02/2014 0000   LDLCALC 80 03/01/2019 1524    CBC    Component Value Date/Time   WBC 5.3 03/01/2019 1524   WBC 5.0 05/30/2014 1542   RBC 4.69 03/01/2019 1524   RBC 4.52 05/30/2014 1542   HGB 14.2 03/01/2019 1524   HCT 40.7 03/01/2019 1524   PLT 288 03/01/2019 1524   MCV 87 03/01/2019 1524   MCH 30.3 03/01/2019 1524   MCH 30.3 05/30/2014 1542   MCHC 34.9 03/01/2019 1524   MCHC 34.3 05/30/2014 1542   RDW 14.1 03/01/2019 1524   LYMPHSABS 1.7 05/01/2014 1946   MONOABS 0.3 05/01/2014 1946   EOSABS 0.0 05/01/2014 1946   BASOSABS 0.0 05/01/2014 1946    ASSESSMENT AND PLAN: 1. Type 2 diabetes mellitus with other circulatory complication, without long-term current use of insulin (HCC) -At goal and diet control.  Continue healthy eating habits. - CBC - Comprehensive metabolic panel - Lipid panel - Microalbumin / creatinine urine ratio - POCT glucose (manual entry) - POCT glycosylated hemoglobin (Hb A1C) - Hemoglobin A1c  2. Essential hypertension Close to goal.  Continue amlodipine and metoprolol  3. Unintended weight loss Patient attributes this to stress and poor appetite.  He does have underlying prostate cancer also. I recommend purchasing some Glucerna or boost shakes and drinking them to help supplement his meals. -Also recommend counseling as I think it would help him and his wife but patient declines stating that he is a person of deep faith and feels this will get him through. - TSH  4. Post-traumatic osteoarthritis of right wrist Continue Voltaren gel and Tylenol.  I have given a limited supply of tramadol to use as needed.  Advised patient that the medication can cause some drowsiness.  [place] controlled substance reporting system was reviewed. - traMADol (ULTRAM) 50 MG tablet; Take 1 tablet (50 mg total) by mouth daily as needed for up to 5 days.  Dispense: 30 tablet; Refill:  0  5. Personal history of fall I recommend referral for physical therapy but patient declines.  Informed him that it is very important that we try to prevent falls given that he is on an anticoagulant.  6. Hemiparesis affecting left side as late effect of stroke (HCC) Continue pravastatin, good blood pressure control and Eliquis  7. Hyperlipidemia associated with type 2 diabetes mellitus (HCC) Continue pravastatin and healthy eating habits  8. Paroxysmal atrial fibrillation (HCC) On Eliquis  9. Screening for colon cancer Encourage patient to use an turn in the Cologuard test.  He promised me that he will do so within the coming week.  10. Need for hepatitis C screening test Patient agreeable to screening - HCV Ab w/Rflx to Verification  11. Malignant neoplasm of prostate [facility]) Encourage patient  to schedule follow-up with the urologist.  If he is reluctant to have prostatectomy, then he can discuss nonsurgical options if they would be appropriate for him.  12. Stressful life event affecting family Advised that he seek counseling perhaps with his wife but he feels it is not necessary  32. Need for diphtheria-tetanus-pertussis (Tdap) vaccine Given today.   Patient was given the opportunity to ask questions.  Patient verbalized understanding of the plan and was able to repeat key elements of the plan.   Orders Placed This Encounter  Procedures  . Tdap vaccine greater than or equal to 7yo IM  . CBC  . Comprehensive metabolic panel  . Lipid panel  . Microalbumin / creatinine urine ratio  . HCV Ab w/Rflx to Verification  . TSH  . Hemoglobin A1c  . POCT glucose (manual entry)  . POCT glycosylated hemoglobin (Hb A1C)     Requested Prescriptions   Signed Prescriptions Disp Refills  . traMADol (ULTRAM) 50 MG tablet 30 tablet 0    Sig: Take 1 tablet (50 mg total) by mouth daily as needed for up to 5 days.    Return in about 4 months (around 05/30/2020).  Karle Plumber,  MD, FACP

## 2020-01-29 NOTE — Patient Instructions (Signed)
Try drinking Ensure or Glucerna shakes to supplement your meals.  If you change your mind on doing some physical therapy please let me know.  I have given a limited supply of tramadol for you to use as needed for the wrist pain.  The medication can cause some drowsiness.  Please remember to use an turn in the Cologuard test which is a test for colon cancer screening.  Consider scheduling appointment with your urologist to discuss other treatment options if you do not want to have surgery.

## 2020-01-30 LAB — CBC
Hematocrit: 40.6 % (ref 37.5–51.0)
Hemoglobin: 13.8 g/dL (ref 13.0–17.7)
MCH: 30.4 pg (ref 26.6–33.0)
MCHC: 34 g/dL (ref 31.5–35.7)
MCV: 89 fL (ref 79–97)
Platelets: 268 10*3/uL (ref 150–450)
RBC: 4.54 x10E6/uL (ref 4.14–5.80)
RDW: 13.8 % (ref 11.6–15.4)
WBC: 4.8 10*3/uL (ref 3.4–10.8)

## 2020-01-30 LAB — HEMOGLOBIN A1C
Est. average glucose Bld gHb Est-mCnc: 151 mg/dL
Hgb A1c MFr Bld: 6.9 % — ABNORMAL HIGH (ref 4.8–5.6)

## 2020-01-30 LAB — COMPREHENSIVE METABOLIC PANEL
ALT: 9 IU/L (ref 0–44)
AST: 10 IU/L (ref 0–40)
Albumin/Globulin Ratio: 2 (ref 1.2–2.2)
Albumin: 4.6 g/dL (ref 3.8–4.8)
Alkaline Phosphatase: 80 IU/L (ref 48–121)
BUN/Creatinine Ratio: 17 (ref 10–24)
BUN: 18 mg/dL (ref 8–27)
Bilirubin Total: 0.3 mg/dL (ref 0.0–1.2)
CO2: 23 mmol/L (ref 20–29)
Calcium: 9.5 mg/dL (ref 8.6–10.2)
Chloride: 103 mmol/L (ref 96–106)
Creatinine, Ser: 1.05 mg/dL (ref 0.76–1.27)
GFR calc Af Amer: 84 mL/min/{1.73_m2} (ref >59)
GFR calc non Af Amer: 73 mL/min/{1.73_m2} (ref >59)
Globulin, Total: 2.3 g/dL (ref 1.5–4.5)
Glucose: 124 mg/dL — ABNORMAL HIGH (ref 65–99)
Potassium: 4.6 mmol/L (ref 3.5–5.2)
Sodium: 139 mmol/L (ref 134–144)
Total Protein: 6.9 g/dL (ref 6.0–8.5)

## 2020-01-30 LAB — TSH: TSH: 3.71 u[IU]/mL (ref 0.450–4.500)

## 2020-01-30 LAB — LIPID PANEL
Chol/HDL Ratio: 3.3 ratio (ref 0.0–5.0)
Cholesterol, Total: 189 mg/dL (ref 100–199)
HDL: 58 mg/dL (ref >39)
LDL Chol Calc (NIH): 109 mg/dL — ABNORMAL HIGH (ref 0–99)
Triglycerides: 123 mg/dL (ref 0–149)
VLDL Cholesterol Cal: 22 mg/dL (ref 5–40)

## 2020-01-30 LAB — HCV AB W/RFLX TO VERIFICATION: HCV Ab: <0.1 s/co ratio (ref 0.0–0.9)

## 2020-01-30 LAB — HCV INTERPRETATION

## 2020-01-31 ENCOUNTER — Other Ambulatory Visit: Payer: Self-pay | Admitting: Internal Medicine

## 2020-01-31 ENCOUNTER — Ambulatory Visit: Payer: Medicare HMO | Admitting: Podiatry

## 2020-01-31 ENCOUNTER — Telehealth: Payer: Self-pay

## 2020-01-31 DIAGNOSIS — I6930 Unspecified sequelae of cerebral infarction: Secondary | ICD-10-CM

## 2020-01-31 MED ORDER — PRAVASTATIN SODIUM 10 MG PO TABS: 10.0000 mg | ORAL_TABLET | Freq: Every day | ORAL | 5 refills | Status: DC

## 2020-01-31 NOTE — Telephone Encounter (Signed)
Contacted pt to go over lab results pt is aware and doesn't have any questions or concerns 

## 2020-02-19 ENCOUNTER — Other Ambulatory Visit: Payer: Self-pay | Admitting: Internal Medicine

## 2020-02-19 MED FILL — ELIQUIS 5 MG TABLET: 5 | 30 days supply | Qty: 60 | Fill #0

## 2020-03-18 ENCOUNTER — Other Ambulatory Visit: Payer: Self-pay

## 2020-03-18 ENCOUNTER — Ambulatory Visit (INDEPENDENT_AMBULATORY_CARE_PROVIDER_SITE_OTHER): Payer: Medicare HMO | Admitting: Dermatology

## 2020-03-18 ENCOUNTER — Encounter: Payer: Self-pay | Admitting: Dermatology

## 2020-03-18 DIAGNOSIS — D239 Other benign neoplasm of skin, unspecified: Secondary | ICD-10-CM

## 2020-03-18 DIAGNOSIS — D2371 Other benign neoplasm of skin of right lower limb, including hip: Secondary | ICD-10-CM | POA: Diagnosis not present

## 2020-03-18 MED FILL — hydrOXYzine HCL 25 MG TABS: 25 | 15 days supply | Qty: 30 | Fill #1

## 2020-03-18 NOTE — Progress Notes (Signed)
RIGHT LEG X 3 MONTHS

## 2020-03-21 DIAGNOSIS — Z1211 Encounter for screening for malignant neoplasm of colon: Secondary | ICD-10-CM | POA: Diagnosis not present

## 2020-03-22 LAB — COLOGUARD: Cologuard: NEGATIVE

## 2020-03-27 ENCOUNTER — Other Ambulatory Visit: Payer: Self-pay

## 2020-03-27 ENCOUNTER — Ambulatory Visit (HOSPITAL_COMMUNITY)
Admission: EM | Admit: 2020-03-27 | Discharge: 2020-03-27 | Disposition: A | Discharge status: Home / Self Care | Payer: Medicare HMO | Attending: Internal Medicine | Admitting: Internal Medicine

## 2020-03-27 ENCOUNTER — Ambulatory Visit: Payer: Self-pay

## 2020-03-27 ENCOUNTER — Other Ambulatory Visit (HOSPITAL_COMMUNITY): Payer: Self-pay | Admitting: Internal Medicine

## 2020-03-27 ENCOUNTER — Encounter (HOSPITAL_COMMUNITY): Payer: Self-pay | Admitting: Emergency Medicine

## 2020-03-27 DIAGNOSIS — H9202 Otalgia, left ear: Secondary | ICD-10-CM | POA: Diagnosis not present

## 2020-03-27 LAB — EXTERNAL GENERIC LAB PROCEDURE: COLOGUARD: NEGATIVE

## 2020-03-27 LAB — COLOGUARD: COLOGUARD: NEGATIVE

## 2020-03-27 MED ORDER — HYDROCORTISONE-ACETIC ACID 1-2 % OT SOLN
3.0000 [drp] | Freq: Two times a day (BID) | OTIC | 0 refills | Status: DC
Start: 2020-03-27 — End: 2020-05-09

## 2020-03-27 NOTE — Telephone Encounter (Signed)
Patient called stating that he has a bloody drainage from his left ear and a fullness to the ear that is affecting his hearing. He first noticed this yesterday He felt something running and used a Qtip gently and found blood. He states that he stopped and placed a cotton ball loosely in his ear. Today when he removed the cotton it still had bright red blood. He has no pain. No dizziness. No sinus drainage.No headache.  He is unsure why his ear would be bleeding. He has suffered no trama of any kind. Per protocol patient will go to UC for evaluation. Office has no openings until late next week.  Care instructions read to patient. He verbalized understanding.  Reason for Disposition . Unexplained bleeding from ear  Answer Assessment - Initial Assessment Questions 1. LOCATION: "Which ear is involved?"      left 2. COLOR: "What is the color of the discharge?"      Red blood 3. CONSISTENCY: "How runny is the discharge? Could it be water?"      water 4. ONSET: "When did you first notice the discharge?"    yesterday 5. PAIN: "Is there any earache?" "How bad is it?"  (Scale 1-10; or mild, moderate, severe)    No 6. OBJECTS: "Have you put anything in your ear?" (e.g., Q-tip, other object)      q tip 7. OTHER SYMPTOMS: "Do you have any other symptoms?" (e.g., headache, fever, dizziness, vomiting, runny nose)     none 8. PREGNANCY: "Is there any chance you are pregnant?" "When was your last menstrual period?"    N/A  Protocols used: EAR - DISCHARGE-A-AH

## 2020-03-27 NOTE — ED Triage Notes (Signed)
Pt presents with left ear pressure. States unable to hear out of ear and states has gotten some blood and fluid out of ear.

## 2020-03-27 NOTE — Discharge Instructions (Signed)
Please take Tylenol as needed for pain Use eardrops as directed Avoid using Q-tips in your ears especially since you are on blood thinners.

## 2020-03-27 NOTE — ED Provider Notes (Signed)
[place]    CSN: [id number] Arrival date & time: 03/27/20  1500      History   Chief Complaint Chief Complaint  Patient presents with  . Otalgia    HPI [name] is a 69 y.o. male on Eliquis comes to the urgent care with complaints of left ear pain which started yesterday.  Patient says left ear pain started after he took a shower.  He subsequently noticed some bloody discharge from the left ear.  He denies cleaning the ears before the bleeding started but used a Q-tip after he noticed bleeding in the ear.  He has had difficulty hearing out of the left ear for a while now.  He denies any ringing in the ears.  No dizziness or feeling of the room spinning around him.   HPI  Past Medical History:  Diagnosis Date  . Abscess of anal and rectal regions 07/15/2007   Qualifier: Diagnosis of  By: Amil Amen MD, Benjamine Mola    . Atrial fibrillation ([place])   . Diabetes mellitus without complication ([place]) Dx 6761  . FISTULA, ANAL 01/27/2007   Qualifier: Diagnosis of  By: Amil Amen MD, Benjamine Mola    . Hypertension Dx 2008  . Prostate cancer ([place])   . Stroke [name])     Patient Active Problem List   Diagnosis Date Noted  . Stressful life event affecting family 01/29/2020  . Hyperlipidemia associated with type 2 diabetes mellitus ([place]) 01/29/2020  . Personal history of fall 01/29/2020  . 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 Unintended weight loss 01/29/2020  . Pain due to onychomycosis of toenails of both feet 08/01/2019  . Porokeratosis 08/01/2019  . Influenza vaccine needed 02/24/2019  . Diabetes mellitus ([place]) 02/24/2019  . Malignant neoplasm of prostate ([place]) 09/23/2018  . Hyperlipemia 11/24/2016  . History of CVA with residual deficit 11/24/2016  . GERD (gastroesophageal reflux disease) 08/09/2014  . Atrial fibrillation ([place])   . Hemiparesis affecting left side as late effect of stroke ([place]) 05/01/2014  . ERECTILE DYSFUNCTION, ORGANIC  07/17/2010  . DEGENERATIVE JOINT DISEASE, CERVICAL SPINE 04/24/2009  . DM2 (diabetes mellitus, type 2) ([place]) 07/03/2006  . Former smoker 07/03/2006  . HIATAL HERNIA WITH REFLUX 07/03/2006  . Essential hypertension 06/08/2006    Past Surgical History:  Procedure Laterality Date  . CATARACT EXTRACTION  06/2017  . CYSTOSCOPY  05/2017  . PROSTATE BIOPSY         Home Medications    Prior to Admission medications   Medication Sig Start Date End Date Taking? Authorizing Provider  acetic acid-hydrocortisone (VOSOL-HC) OTIC solution Place 3 drops into the left ear 2 (two) times daily. 03/27/20   Chase Picket, MD  amLODipine (NORVASC) 10 MG tablet Take 1 tablet (10 mg total) by mouth daily. 11/14/19   [name], MD  Blood Glucose Monitoring Suppl (TRUE METRIX METER) w/Device KIT Use as directed 05/11/16   Boykin Nearing, MD  diclofenac Sodium (VOLTAREN) 1 % GEL APPLY 2 GRAMS TOPICALLY FOUR TIMES DAILY 04/19/17   [provider]  ELIQUIS 5 MG TABS tablet TAKE 1 TABLET BY MOUTH TWICE DAILY. 02/19/20   [name], MD  fluticasone (FLONASE) 50 MCG/ACT nasal spray Place 1 spray into both nostrils daily as needed for allergies or rhinitis. 07/27/19   [name], MD  GlucoCom Lancets MISC 100 Units by Does not apply route once. 03/13/15   Funches, Adriana Mccallum, MD  glucose blood (TRUE METRIX BLOOD GLUCOSE TEST) test strip  Use as instructed 05/11/16   Boykin Nearing, MD  hydrOXYzine (ATARAX/VISTARIL) 25 MG tablet TAKE 1 TABLET (25 MG TOTAL) BY MOUTH 2 (TWO) TIMES DAILY AS NEEDED. 01/08/20   [name], MD  metoprolol tartrate (LOPRESSOR) 25 MG tablet Take 0.5 tablets (12.5 mg total) by mouth 2 (two) times daily. 06/02/19   [name], MD  Multiple Vitamins-Minerals (MENS MULTIVITAMIN PLUS PO) Take 1 tablet by mouth daily.    [provider]  pravastatin (PRAVACHOL) 10 MG tablet Take 1 tablet (10 mg total) by mouth daily. 01/31/20   [name], MD    Family History Family History  Problem Relation Age of Onset  . Diabetes Mother   . Breast cancer Mother   . Diabetes Father   . Breast cancer Sister   . Prostate cancer Neg Hx   . Colon cancer Neg Hx   . Pancreatic cancer Neg Hx     Social History Social History   Tobacco Use  . Smoking status: Former Smoker    Packs/day: 0.25    Years: 5.00    Pack years: 1.25    Types: Cigarettes    Quit date: 05/25/2014    Years since quitting: 5.8  . Smokeless tobacco: Never Used  . Tobacco comment: Quit 05/15/14  Vaping Use  . Vaping Use: Never used  Substance Use Topics  . Alcohol use: No    Alcohol/week: 0.0 standard drinks  . Drug use: No     Allergies   Lisinopril   Review of Systems Review of Systems  HENT: Positive for ear discharge, ear pain and hearing loss. Negative for tinnitus and voice change.   Respiratory: Negative.   Gastrointestinal: Negative.      Physical Exam Triage Vital Signs ED Triage Vitals  Enc Vitals Group     BP 03/27/20 1707 113/80     Pulse Rate 03/27/20 1707 70     Resp 03/27/20 1707 16     Temp 03/27/20 1707 97.8 F (36.6 C)     Temp Source 03/27/20 1707 Axillary     SpO2 03/27/20 1707 100 %     Weight --      Height --      Head Circumference --      Peak Flow --      Pain Score 03/27/20 1706 0     Pain Loc --      Pain Edu? --      Excl. in Industry? --    No data found.  Updated Vital Signs BP 113/80 (BP Location: Right Arm)   Pulse 70   Temp 97.8 F (36.6 C) (Axillary)   Resp 16   SpO2 100%   Visual Acuity Right Eye Distance:   Left Eye Distance:   Bilateral Distance:    Right Eye Near:   Left Eye Near:    Bilateral Near:     Physical Exam Vitals and nursing note reviewed.  HENT:     Right Ear: Tympanic membrane normal.     Left Ear: Tympanic membrane normal.     Ears:     Comments: Superficial ulceration with some bleeding in the left ear.  Tympanic membrane is without any erythema or  disruption.  No middle ear fluid noted. Pulmonary:     Effort: Pulmonary effort is normal.     Breath sounds: Normal breath sounds.  Skin:    General: Skin is warm.     Findings: No bruising or erythema.  Neurological:     Mental Status: He is alert.      UC Treatments / Results  Labs (all labs ordered are listed, but only abnormal results are displayed) Labs Reviewed - No data to display  EKG   Radiology No results found.  Procedures Procedures (including critical care time)  Medications Ordered in UC Medications - No data to display  Initial Impression / Assessment and Plan / UC Course  I have reviewed the triage vital signs and the nursing notes.  Pertinent labs & imaging results that were available during my care of the patient were reviewed by me and considered in my medical decision making (see chart for details).     1.  Left ear pain secondary to abrasion in the  ear canal: Avoid using Q-tips to clean the ears VoSoL eardrops Tylenol as needed for pain Return precautions given  Final Clinical Impressions(s) / UC Diagnoses   Final diagnoses:  Otalgia of left ear     Discharge Instructions     Please take Tylenol as needed for pain Use eardrops as directed Avoid using Q-tips in your ears especially since you are on blood thinners.   ED Prescriptions    Medication Sig Dispense Auth. Provider   acetic acid-hydrocortisone (VOSOL-HC) OTIC solution Place 3 drops into the left ear 2 (two) times daily. 10 mL Trystyn Sitts, Myrene Galas, MD     PDMP not reviewed this encounter.   Chase Picket, MD 03/27/20 [phone]

## 2020-03-28 MED FILL — HYDROCORTISON-ACETIC ACID S: 1-2 | 25 days supply | Qty: 10 | Fill #0

## 2020-03-28 NOTE — Telephone Encounter (Signed)
Scheduled on 04/08/2020

## 2020-04-03 ENCOUNTER — Other Ambulatory Visit: Payer: Self-pay

## 2020-04-03 ENCOUNTER — Telehealth: Payer: Self-pay | Admitting: Internal Medicine

## 2020-04-03 NOTE — Telephone Encounter (Signed)
Contacted pt and left a detailed vm informing pt of results and if he has any questions or concerns to give a call

## 2020-04-08 ENCOUNTER — Other Ambulatory Visit: Payer: Self-pay | Admitting: Internal Medicine

## 2020-04-08 ENCOUNTER — Ambulatory Visit: Payer: Medicare HMO | Attending: Internal Medicine | Admitting: Internal Medicine

## 2020-04-08 ENCOUNTER — Encounter: Payer: Self-pay | Admitting: Internal Medicine

## 2020-04-08 ENCOUNTER — Other Ambulatory Visit: Payer: Self-pay

## 2020-04-08 VITALS — BP 135/80 | HR 84 | Resp 16 | Wt 191.2 lb

## 2020-04-08 DIAGNOSIS — M19131 Post-traumatic osteoarthritis, right wrist: Secondary | ICD-10-CM

## 2020-04-08 DIAGNOSIS — H9202 Otalgia, left ear: Secondary | ICD-10-CM

## 2020-04-08 DIAGNOSIS — H6121 Impacted cerumen, right ear: Secondary | ICD-10-CM | POA: Diagnosis not present

## 2020-04-08 DIAGNOSIS — M7121 Synovial cyst of popliteal space [Baker], right knee: Secondary | ICD-10-CM | POA: Diagnosis not present

## 2020-04-08 MED ORDER — TRAMADOL HCL 50 MG PO TABS: 50.0000 mg | ORAL_TABLET | Freq: Two times a day (BID) | ORAL | 0 refills | Status: DC | PRN

## 2020-04-08 MED FILL — traMADol HCL 50 MG TABS: 50 | 7 days supply | Qty: 15 | Fill #0

## 2020-04-08 NOTE — Progress Notes (Signed)
Patient ID: [name], male    DOB: 19-Jul-1950  MRN: [id number]  CC: Hospitalization Follow-up (ED)   Subjective: [name] is a 69 y.o. male who presents for ER f/u  His concerns today include:  Pt with hx of HTN, DM,CKD 2,CVA with Lt sided weakness 04/2014, a.fib on DOAG, HL,prostate CA.cocaine abusein remission since 2005.  Pt seen in UC 03/27/2020 for LT ear pain and decrease hearing -pt took shower and felt like water was in the ear.  He used 2 Q-tips at one time to try to clean ear and get out any residual water.  He noticed blood on the Q-tips.  He was subsequently seen at urgent care.  He was noted to have a superficial ulceration with some bleeding in the left ear.  He was advised to avoid Q-tips.  He was given some of all VoSol eardrops.  He has not had any further bleeding or pain from the ear.  Reports decrease hearing from July until about 1 wk ago.  Now he is hearing fine.  He admits that sometimes he used hairpins to clean out the ear.  Complains of some intermittent swelling of the right wrist.  He noticed that sometimes in the mornings when he wakes up.  Has intermittent soreness and pain in the wrist.  Has known arthritis.  Is requesting a few more tramadol's. -  Patient Active Problem List   Diagnosis Date Noted  . Stressful life event affecting family 01/29/2020  . Hyperlipidemia associated with type 2 diabetes mellitus ([place]) 01/29/2020  . Personal history of fall 01/29/2020  . 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 Unintended weight loss 01/29/2020  . Pain due to onychomycosis of toenails of both feet 08/01/2019  . Porokeratosis 08/01/2019  . Influenza vaccine needed 02/24/2019  . Diabetes mellitus ([place]) 02/24/2019  . Malignant neoplasm of prostate ([place]) 09/23/2018  . Hyperlipemia 11/24/2016  . History of CVA with residual deficit 11/24/2016  . GERD (gastroesophageal reflux disease) 08/09/2014  . Atrial fibrillation  ([place])   . Hemiparesis affecting left side as late effect of stroke ([place]) 05/01/2014  . 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 DEGENERATIVE JOINT DISEASE, CERVICAL SPINE 04/24/2009  . DM2 (diabetes mellitus, type 2) ([place]) 07/03/2006  . Former smoker 07/03/2006  . HIATAL HERNIA WITH REFLUX 07/03/2006  . Essential hypertension 06/08/2006     Current Outpatient Medications on File Prior to Visit  Medication Sig Dispense Refill  . acetic acid-hydrocortisone (VOSOL-HC) OTIC solution Place 3 drops into the left ear 2 (two) times daily. 10 mL 0  . amLODipine (NORVASC) 10 MG tablet Take 1 tablet (10 mg total) by mouth daily. 90 tablet 1  . Blood Glucose Monitoring Suppl (TRUE METRIX METER) w/Device KIT Use as directed 1 kit 0  . diclofenac Sodium (VOLTAREN) 1 % GEL APPLY 2 GRAMS TOPICALLY FOUR TIMES DAILY    . ELIQUIS 5 MG TABS tablet TAKE 1 TABLET BY MOUTH TWICE DAILY. 60 tablet 2  . fluticasone (FLONASE) 50 MCG/ACT nasal spray Place 1 spray into both nostrils daily as needed for allergies or rhinitis. 16 g 1  . GlucoCom Lancets MISC 100 Units by Does not apply route once. 100 each 0  . glucose blood (TRUE METRIX BLOOD GLUCOSE TEST) test strip Use as instructed 100 each 12  . hydrOXYzine (ATARAX/VISTARIL) 25 MG tablet TAKE 1 TABLET (25 MG TOTAL) BY MOUTH 2 (TWO) TIMES DAILY AS NEEDED. 30 tablet 2  . metoprolol tartrate (LOPRESSOR) 25  MG tablet Take 0.5 tablets (12.5 mg total) by mouth 2 (two) times daily. 90 tablet 3  . Multiple Vitamins-Minerals (MENS MULTIVITAMIN PLUS PO) Take 1 tablet by mouth daily.    . pravastatin (PRAVACHOL) 10 MG tablet Take 1 tablet (10 mg total) by mouth daily. 30 tablet 5   No current facility-administered medications on file prior to visit.    Allergies  Allergen Reactions  . Lisinopril Swelling    Social History   Socioeconomic History  . Marital status: Married    Spouse name: Not on file  . Number of children: 5  . Years of education: Not on file   . Highest education level: Not on file  Occupational History  . Occupation: Caregiver    Comment: retired  Tobacco Use  . Smoking status: Former Smoker    Packs/day: 0.25    Years: 5.00    Pack years: 1.25    Types: Cigarettes    Quit date: 05/25/2014    Years since quitting: 5.8  . Smokeless tobacco: Never Used  . Tobacco comment: Quit 05/15/14  Vaping Use  . Vaping Use: Never used  Substance and Sexual Activity  . Alcohol use: No    Alcohol/week: 0.0 standard drinks  . Drug use: No  . Sexual activity: Not Currently  Other Topics Concern  . Not on file  Social History Narrative   Lives with his wife   5 children, grown.   7 grandchildren.    Social Determinants of Health   Financial Resource Strain:   . Difficulty of Paying Living Expenses: Not on file  Food Insecurity:   . Worried About Charity fundraiser in the Last Year: Not on file  . Ran Out of Food in the Last Year: Not on file  Transportation Needs:   . Lack of Transportation (Medical): Not on file  . Lack of Transportation (Non-Medical): Not on file  Physical Activity:   . Days of Exercise per Week: Not on file  . Minutes of Exercise per Session: Not on file  Stress:   . Feeling of Stress : Not on file  Social Connections:   . Frequency of Communication with Friends and Family: Not on file  . Frequency of Social Gatherings with Friends and Family: Not on file  . Attends Religious Services: Not on file  . Active Member of Clubs or Organizations: Not on file  . Attends Archivist Meetings: Not on file  . Marital Status: Not on file  Intimate Partner Violence:   . Fear of Current or Ex-Partner: Not on file  . Emotionally Abused: Not on file  . Physically Abused: Not on file  . Sexually Abused: Not on file    Family History  Problem Relation Age of Onset  . Diabetes Mother   . Breast cancer Mother   . Diabetes Father   . Breast cancer Sister   . Prostate cancer Neg Hx   . Colon cancer  Neg Hx   . Pancreatic cancer Neg Hx     Past Surgical History:  Procedure Laterality Date  . CATARACT EXTRACTION  06/2017  . CYSTOSCOPY  05/2017  . PROSTATE BIOPSY      ROS: Review of Systems Negative except as stated above  PHYSICAL EXAM: BP 135/80   Pulse 84   Resp 16   Wt 191 lb 3.2 oz (86.7 kg)   SpO2 97%   BMI 25.23 kg/m   Wt Readings from Last 3 Encounters:  04/08/20  191 lb 3.2 oz (86.7 kg)  01/29/20 183 lb 9.6 oz (83.3 kg)  11/13/19 187 lb 3.2 oz (84.9 kg)    Physical Exam General appearance - alert, well appearing, and in no distress Mental status - normal mood, behavior, speech, dress, motor activity, and thought processes Ears -left ear: Patient has a scab close to the opening and on the inferior wall of the canal.  Otherwise the canal and tympanic membrane are within normal limits.  Right ear: He has small amount of wax buildup at the entrance to the canal.  The canal and tympanic membrane are within normal limits Musculoskeletal -right wrist: 2 cm soft movable cyst over the radial bone.  The wrist joint is enlarged.  No point tenderness.  He has good range of motion.  CMP Latest Ref Rng & Units 01/29/2020 03/01/2019 06/09/2018  Glucose 65 - 99 mg/dL 124(H) 95 128(H)  BUN 8 - 27 mg/dL $Remove'18 15 21  'MXcuXdp$ Creatinine 0.76 - 1.27 mg/dL 1.05 1.30(H) 1.29(H)  Sodium 134 - 144 mmol/L 139 140 138  Potassium 3.5 - 5.2 mmol/L 4.6 4.5 4.5  Chloride 96 - 106 mmol/L 103 104 101  CO2 20 - 29 mmol/L $RemoveB'23 22 24  'hTaitYdh$ Calcium 8.6 - 10.2 mg/dL 9.5 9.8 9.6  Total Protein 6.0 - 8.5 g/dL 6.9 7.1 7.4  Total Bilirubin 0.0 - 1.2 mg/dL 0.3 0.2 0.3  Alkaline Phos 48 - 121 IU/L 80 78 78  AST 0 - 40 IU/L $Remov'10 14 13  'PFvdUg$ ALT 0 - 44 IU/L $Remov'9 8 11   'rbMKVC$ Lipid Panel     Component Value Date/Time   CHOL 189 01/29/2020 1022   TRIG 123 01/29/2020 1022   HDL 58 01/29/2020 1022   CHOLHDL 3.3 01/29/2020 1022   CHOLHDL 4.2 05/02/2014 0000   VLDL 72 (H) 05/02/2014 0000   LDLCALC 109 (H) 01/29/2020 1022    CBC     Component Value Date/Time   WBC 4.8 01/29/2020 1022   WBC 5.0 05/30/2014 1542   RBC 4.54 01/29/2020 1022   RBC 4.52 05/30/2014 1542   HGB 13.8 01/29/2020 1022   HCT 40.6 01/29/2020 1022   PLT 268 01/29/2020 1022   MCV 89 01/29/2020 1022   MCH 30.4 01/29/2020 1022   MCH 30.3 05/30/2014 1542   MCHC 34.0 01/29/2020 1022   MCHC 34.3 05/30/2014 1542   RDW 13.8 01/29/2020 1022   LYMPHSABS 1.7 05/01/2014 1946   MONOABS 0.3 05/01/2014 1946   EOSABS 0.0 05/01/2014 1946   BASOSABS 0.0 05/01/2014 1946    ASSESSMENT AND PLAN:  1. Ear pain, left 2. Excessive cerumen in right ear canal -advised to avoid putting hairpins in ears. -advise to use OTC wax softner in RT ear PRN  3. Post-traumatic osteoarthritis of right wrist - DG Wrist Complete Right; Future - traMADol (ULTRAM) 50 MG tablet; Take 1 tablet (50 mg total) by mouth every 12 (twelve) hours as needed.  Dispense: 15 tablet; Refill: 0  4. Baker's cyst, right Offered referral to ortho but pt wants to hold off for now.  Pt to return next wk for flu shot.  Just received COVID booster today and wants to hold off on getting the flu shot for 1 wk  Patient was given the opportunity to ask questions.  Patient verbalized understanding of the plan and was able to repeat key elements of the plan.   No orders of the defined types were placed in this encounter.    Requested Prescriptions    No  prescriptions requested or ordered in this encounter    No follow-ups on file.  Karle Plumber, MD, FACP

## 2020-04-17 NOTE — Progress Notes (Addendum)
   New Patient   Subjective  [name] is a 69 y.o. male who presents for the following: Skin Problem (LESION ON RIGHT LEG X MONTHS).  Lesion  Location:  Duration:  Quality:  Associated Signs/Symptoms: Modifying Factors:  Severity:  Timing: Context:    The following portions of the chart were reviewed this encounter and updated as appropriate: Tobacco  Allergies  Meds  Problems  Med Hx  Surg Hx  Fam Hx      Objective  Well appearing patient in no apparent distress; mood and affect are within normal limits.  A focused examination was performed including lower extremities, including the legs, feet, toes, and toenails. Relevant physical exam findings are noted in the Assessment and Plan.   Assessment & Plan  Dermatofibroma Right Lower Leg - Anterior  Photo taken.  May leave if stable.

## 2020-04-19 ENCOUNTER — Ambulatory Visit: Payer: Self-pay | Admitting: Internal Medicine

## 2020-04-19 ENCOUNTER — Other Ambulatory Visit: Payer: Self-pay | Admitting: Family Medicine

## 2020-04-19 ENCOUNTER — Other Ambulatory Visit: Payer: Self-pay

## 2020-04-19 ENCOUNTER — Ambulatory Visit: Payer: Medicare HMO | Attending: Family Medicine | Admitting: Family Medicine

## 2020-04-19 ENCOUNTER — Encounter: Payer: Self-pay | Admitting: Family Medicine

## 2020-04-19 VITALS — BP 151/84 | HR 71 | Temp 98.2°F | Ht 73.0 in | Wt 191.0 lb

## 2020-04-19 DIAGNOSIS — R200 Anesthesia of skin: Secondary | ICD-10-CM | POA: Diagnosis not present

## 2020-04-19 DIAGNOSIS — M25552 Pain in left hip: Secondary | ICD-10-CM

## 2020-04-19 DIAGNOSIS — E1159 Type 2 diabetes mellitus with other circulatory complications: Secondary | ICD-10-CM | POA: Diagnosis not present

## 2020-04-19 DIAGNOSIS — M5416 Radiculopathy, lumbar region: Secondary | ICD-10-CM | POA: Diagnosis not present

## 2020-04-19 MED ORDER — PREDNISONE 20 MG PO TABS: ORAL_TABLET | ORAL | 0 refills | Status: DC

## 2020-04-19 MED ORDER — GABAPENTIN 100 MG PO CAPS: 100.0000 mg | ORAL_CAPSULE | Freq: Three times a day (TID) | ORAL | 3 refills | Status: DC

## 2020-04-19 MED ORDER — TRAMADOL HCL 50 MG PO TABS: 50.0000 mg | ORAL_TABLET | Freq: Four times a day (QID) | ORAL | 0 refills | Status: DC | PRN

## 2020-04-19 MED FILL — predniSONE 20 MG TABS: 20 | 8 days supply | Qty: 8 | Fill #0

## 2020-04-19 MED FILL — traMADol HCL 50 MG TABS: 50 | 5 days supply | Qty: 20 | Fill #0

## 2020-04-19 MED FILL — GABAPENTIN 100 MG CAPSULE: 100 | 30 days supply | Qty: 90 | Fill #0

## 2020-04-19 NOTE — Progress Notes (Signed)
PAIN IN LEFT HIP WHEN WALKING FEELS LIKE LEG GOING NUMB PT STATED STOPPED TAKING LOPRESSOR 1 MONTH AGO  HAS NOT TAKING BP MEDS TODAY

## 2020-04-19 NOTE — Patient Instructions (Signed)
Radicular Pain Radicular pain is a type of pain that spreads from your back or neck along a spinal nerve. Spinal nerves are nerves that leave the spinal cord and go to the muscles. Radicular pain is sometimes called radiculopathy, radiculitis, or a pinched nerve. When you have this type of pain, you may also have weakness, numbness, or tingling in the area of your body that is supplied by the nerve. The pain may feel sharp and burning. Depending on which spinal nerve is affected, the pain may occur in the:  Neck area (cervical radicular pain). You may also feel pain, numbness, weakness, or tingling in the arms.  Mid-spine area (thoracic radicular pain). You would feel this pain in the back and chest. This type is rare.  Lower back area (lumbar radicular pain). You would feel this pain as low back pain. You may feel pain, numbness, weakness, or tingling in the buttocks or legs. Sciatica is a type of lumbar radicular pain that shoots down the back of the leg. Radicular pain occurs when one of the spinal nerves becomes irritated or squeezed (compressed). It is often caused by something pushing on a spinal nerve, such as one of the bones of the spine (vertebrae) or one of the round cushions between vertebrae (intervertebral disks). This can result from:  An injury.  Wear and tear or aging of a disk.  The growth of a bone spur that pushes on the nerve. Radicular pain often goes away when you follow instructions from your health care provider for relieving pain at home. Follow these instructions at home: Managing pain      If directed, put ice on the affected area: ? Put ice in a plastic bag. ? Place a towel between your skin and the bag. ? Leave the ice on for 20 minutes, 2-3 times a day.  If directed, apply heat to the affected area as often as told by your health care provider. Use the heat source that your health care provider recommends, such as a moist heat pack or a heating pad. ? Place  a towel between your skin and the heat source. ? Leave the heat on for 20-30 minutes. ? Remove the heat if your skin turns bright red. This is especially important if you are unable to feel pain, heat, or cold. You may have a greater risk of getting burned. Activity   Do not sit or rest in bed for long periods of time.  Try to stay as active as possible. Ask your health care provider what type of exercise or activity is best for you.  Avoid activities that make your pain worse, such as bending and lifting.  Do not lift anything that is heavier than 10 lb (4.5 kg), or the limit that you are told, until your health care provider says that it is safe.  Practice using proper technique when lifting items. Proper lifting technique involves bending your knees and rising up.  Do strength and range-of-motion exercises only as told by your health care provider or physical therapist. General instructions  Take over-the-counter and prescription medicines only as told by your health care provider.  Pay attention to any changes in your symptoms.  Keep all follow-up visits as told by your health care provider. This is important. ? Your health care provider may send you to a physical therapist to help with this pain. Contact a health care provider if:  Your pain and other symptoms get worse.  Your pain medicine is not   helping.  Your pain has not improved after a few weeks of home care.  You have a fever. Get help right away if:  You have severe pain, weakness, or numbness.  You have difficulty with bladder or bowel control. Summary  Radicular pain is a type of pain that spreads from your back or neck along a spinal nerve.  When you have radicular pain, you may also have weakness, numbness, or tingling in the area of your body that is supplied by the nerve.  The pain may feel sharp or burning.  Radicular pain may be treated with ice, heat, medicines, or physical therapy. This  information is not intended to replace advice given to you by your health care provider. Make sure you discuss any questions you have with your health care provider. Document Revised: 12/21/2017 Document Reviewed: 12/21/2017 Elsevier Patient Education  [employer].  Spinal Stenosis  Spinal stenosis happens when the open space (spinal canal) between the bones of your spine (vertebrae) gets smaller. It is caused by bone pushing into the open spaces of your backbone (spine). This puts pressure on your backbone and the nerves in your backbone. Treatment often focuses on managing any pain and symptoms. In some cases, surgery may be needed. Follow these instructions at home: Managing pain, stiffness, and swelling   Do all exercises and stretches as told by your doctor.  Stand and sit up straight (use good posture). If you were given a brace or a corset, wear it as told by your doctor.  Do not do any activities that cause pain. Ask your doctor what activities are safe for you.  Do not lift anything that is heavier than 10 lb (4.5 kg) or heavier than your doctor tells you.  Try to stay at a healthy weight. Talk with your doctor if you need help losing weight.  If directed, put heat on the affected area as often as told by your doctor. Use the heat source that your doctor recommends, such as a moist heat pack or a heating pad. ? Put a towel between your skin and the heat source. ? Leave the heat on for 20-30 minutes. ? Remove the heat if your skin turns bright red. This is especially important if you are not able to feel pain, heat, or cold. You may have a greater risk of getting burned. General instructions  Take over-the-counter and prescription medicines only as told by your doctor.  Do not use any products that contain nicotine or tobacco, such as cigarettes and e-cigarettes. If you need help quitting, ask your doctor.  Eat a healthy diet. This includes plenty of fruits and  vegetables, whole grains, and low-fat (lean) protein.  Keep all follow-up visits as told by your doctor. This is important. Contact a doctor if:  Your symptoms do not get better.  Your symptoms get worse.  You have a fever. Get help right away if:  You have new or worse pain in your neck or upper back.  You have very bad pain that medicine does not control.  You are dizzy.  You have vision problems, blurred vision, or double vision.  You have a very bad headache that is worse when you stand.  You feel sick to your stomach (nauseous).  You throw up (vomit).  You have new or worse numbness or tingling in your back or legs.  You have pain, redness, swelling, or warmth in your arm or leg. Summary  Spinal stenosis happens when the open  space (spinal canal) between the bones of your spine gets smaller (narrow).  Contact a doctor if your symptoms get worse.  In some cases, surgery may be needed. This information is not intended to replace advice given to you by your health care provider. Make sure you discuss any questions you have with your health care provider. Document Revised: 05/21/2017 Document Reviewed: 05/13/2016 Elsevier Patient Education  2020 [employer].

## 2020-04-19 NOTE — Telephone Encounter (Signed)
Pt. Called to report pain and numbness in left leg.  Reported the pain in left hip and leg has been present about 2 mos., and has progressively worsened.  Stated the numbness is from the knee down.  Numbness comes and goes, but has been "more pronounced recently".  Denied change in skin color or temperature.  Reported intermittent left ankle swelling.  Reported hx of stroke in 2014, and has had weakness in left side since then.  Rating his leg pain at 7/10.  Taking ES Tylenol 2 tablets qid, with some easing of pain for short period.  Reported he has already taken ES Tylenol 2 tabs x 2 doses in past 3.5 hrs., this morning.  Advised with level of pain and worsening numbness he should be evaluated for this before his appt. that is sched. On Monday, 11/1.  Phone call to [place] office.  Spoke with Animal nutritionist.  Advised to send note to office with high priority and nurse will call pt. Back.  Notified the pt. Of plan.  Verb. Understanding.   Reason for Disposition  Numbness in a leg or foot (i.e., loss of sensation)  Answer Assessment - Initial Assessment Questions 1. ONSET: "When did the pain start?"      About 2 mos. ago 2. LOCATION: "Where is the pain located?"      Left hip and leg 3. PAIN: "How bad is the pain?"    (Scale 1-10; or mild, moderate, severe)   -  MILD (1-3): doesn't interfere with normal activities    -  MODERATE (4-7): interferes with normal activities (e.g., work or school) or awakens from sleep, limping    -  SEVERE (8-10): excruciating pain, unable to do any normal activities, unable to walk     7/10  4. WORK OR EXERCISE: "Has there been any recent work or exercise that involved this part of the body?"      No  5. CAUSE: "What do you think is causing the leg pain?"     unknown 6. OTHER SYMPTOMS: "Do you have any other symptoms?" (e.g., chest pain, back pain, breathing difficulty, swelling, rash, fever, numbness, weakness)      Hx of stroke 2014; stated left side has been  weaker since the stroke.  No change in skin color or temperature of left leg; c/o intermittent numbness from left knee down, and this has been more pronounced recently; denied any speech changes; denied any change in weakness of extremities 7. PREGNANCY: "Is there any chance you are pregnant?" "When was your last menstrual period?"     n/a  Protocols used: LEG PAIN-A-AH

## 2020-04-19 NOTE — Progress Notes (Signed)
Established Patient Office Visit  Subjective:  Patient ID: [name], male    DOB: 01-Jul-1950  Age: 69 y.o. MRN: [id number]  CC:  Chief Complaint  Patient presents with  . Pain    LEFT HIP    HPI [place], 69 year old male who is a patient of Dr. [name] who is seen for acute work in visit due to complaint of pain and numbness in the left hip/leg for more than 2 months but pain has been increasing.  He noticed initial onset of pain in the left hip area about 2 months ago after he had been doing a lot of walking at discharge.  He reports the pain has progressively worsened and ranges from a 7-9 on a 0-to-10 scale.  He does have past medical history of stroke and has had left-sided weakness since that time however he has noticed that he feels as if his left leg is weaker over the past 2 months.  At times he has to walk slightly crouched over especially in the mornings due to the pain in his hip.  Patient feels as if pain radiates from the hip area down the lower leg.  He has also noticed onset of numbness which starts below the left knee and includes the whole lower leg and foot.  He reports that the numbness has a painful quality.  He does not recall any specific incident of sudden onset of pain and does not recall any prior back injury or history of lumbar degenerative disc disease/lumbar radiculopathy.  He denies any changes in bowel or bladder function.  He does have issues with chronic, recurrent constipation for which he takes a laxative as needed.  He denies any new onset urinary incontinence.  He reports that he takes extra strength Tylenol several times per day without any significant improvement in the hip pain and discomfort due to leg numbness.  Past Medical History:  Diagnosis Date  . Abscess of anal and rectal regions 07/15/2007   Qualifier: Diagnosis of  By: Amil Amen MD, Benjamine Mola    . Atrial fibrillation ([place])   . Diabetes mellitus without complication ([place]) Dx  8588  . FISTULA, ANAL 01/27/2007   Qualifier: Diagnosis of  By: Amil Amen MD, Benjamine Mola    . Hypertension Dx 2008  . Prostate cancer ([place])   . Stroke [facility])     Past Surgical History:  Procedure Laterality Date  . CATARACT EXTRACTION  06/2017  . CYSTOSCOPY  05/2017  . PROSTATE BIOPSY      Family History  Problem Relation Age of Onset  . Diabetes Mother   . Breast cancer Mother   . Diabetes Father   . Breast cancer Sister   . Prostate cancer Neg Hx   . Colon cancer Neg Hx   . Pancreatic cancer Neg Hx     Social History   Socioeconomic History  . Marital status: Married    Spouse name: Not on file  . Number of children: 5  . Years of education: Not on file  . Highest education level: Not on file  Occupational History  . Occupation: Caregiver    Comment: retired  Tobacco Use  . Smoking status: Former Smoker    Packs/day: 0.25    Years: 5.00    Pack years: 1.25    Types: Cigarettes    Quit date: 05/25/2014    Years since quitting: 5.9  . Smokeless tobacco: Never Used  . Tobacco comment: Quit 05/15/14  Vaping Use  .  Vaping Use: Never used  Substance and Sexual Activity  . Alcohol use: No    Alcohol/week: 0.0 standard drinks  . Drug use: No  . Sexual activity: Not Currently  Other Topics Concern  . Not on file  Social History Narrative   Lives with his wife   5 children, grown.   7 grandchildren.    Social Determinants of Health   Financial Resource Strain:   . Difficulty of Paying Living Expenses: Not on file  Food Insecurity:   . Worried About Charity fundraiser in the Last Year: Not on file  . Ran Out of Food in the Last Year: Not on file  Transportation Needs:   . Lack of Transportation (Medical): Not on file  . Lack of Transportation (Non-Medical): Not on file  Physical Activity:   . Days of Exercise per Week: Not on file  . Minutes of Exercise per Session: Not on file  Stress:   . Feeling of Stress : Not on file  Social Connections:   .  Frequency of Communication with Friends and Family: Not on file  . Frequency of Social Gatherings with Friends and Family: Not on file  . Attends Religious Services: Not on file  . Active Member of Clubs or Organizations: Not on file  . Attends Archivist Meetings: Not on file  . Marital Status: Not on file  Intimate Partner Violence:   . Fear of Current or Ex-Partner: Not on file  . Emotionally Abused: Not on file  . Physically Abused: Not on file  . Sexually Abused: Not on file    Outpatient Medications Prior to Visit  Medication Sig Dispense Refill  . acetic acid-hydrocortisone (VOSOL-HC) OTIC solution Place 3 drops into the left ear 2 (two) times daily. 10 mL 0  . amLODipine (NORVASC) 10 MG tablet Take 1 tablet (10 mg total) by mouth daily. 90 tablet 1  . Blood Glucose Monitoring Suppl (TRUE METRIX METER) w/Device KIT Use as directed 1 kit 0  . diclofenac Sodium (VOLTAREN) 1 % GEL APPLY 2 GRAMS TOPICALLY FOUR TIMES DAILY    . ELIQUIS 5 MG TABS tablet TAKE 1 TABLET BY MOUTH TWICE DAILY. 60 tablet 2  . fluticasone (FLONASE) 50 MCG/ACT nasal spray Place 1 spray into both nostrils daily as needed for allergies or rhinitis. 16 g 1  . GlucoCom Lancets MISC 100 Units by Does not apply route once. 100 each 0  . glucose blood (TRUE METRIX BLOOD GLUCOSE TEST) test strip Use as instructed 100 each 12  . hydrOXYzine (ATARAX/VISTARIL) 25 MG tablet TAKE 1 TABLET (25 MG TOTAL) BY MOUTH 2 (TWO) TIMES DAILY AS NEEDED. 30 tablet 2  . Multiple Vitamins-Minerals (MENS MULTIVITAMIN PLUS PO) Take 1 tablet by mouth daily.    . pravastatin (PRAVACHOL) 10 MG tablet Take 1 tablet (10 mg total) by mouth daily. 30 tablet 5  . traMADol (ULTRAM) 50 MG tablet Take 1 tablet (50 mg total) by mouth every 12 (twelve) hours as needed. 15 tablet 0  . metoprolol tartrate (LOPRESSOR) 25 MG tablet Take 0.5 tablets (12.5 mg total) by mouth 2 (two) times daily. (Patient not taking: Reported on 04/19/2020) 90 tablet 3    No facility-administered medications prior to visit.    Allergies  Allergen Reactions  . Lisinopril Swelling    ROS Review of Systems  Constitutional: Positive for fatigue. Negative for chills and fever.  Respiratory: Negative for cough and shortness of breath.   Cardiovascular: Negative for chest  pain and palpitations.  Gastrointestinal: Positive for constipation. Negative for abdominal pain, blood in stool, diarrhea and nausea.  Endocrine: Negative for polydipsia, polyphagia and polyuria.  Genitourinary: Negative for dysuria and frequency.  Musculoskeletal: Positive for arthralgias, back pain and gait problem.  Skin: Negative for rash and wound.  Neurological: Positive for weakness and numbness. Negative for dizziness and headaches.  Hematological: Negative for adenopathy. Does not bruise/bleed easily.      Objective:    Physical Exam Vitals and nursing note reviewed.  Constitutional:      Appearance: Normal appearance.  Cardiovascular:     Rate and Rhythm: Normal rate and regular rhythm.     Comments: Heart sounds are soft/distant sounding and heart rate seem slow but patient appears to be in normal sinus rhythm at today's visit Pulmonary:     Effort: Pulmonary effort is normal.     Breath sounds: Normal breath sounds.  Abdominal:     Palpations: Abdomen is soft.     Tenderness: There is no abdominal tenderness. There is no right CVA tenderness, left CVA tenderness, guarding or rebound.  Musculoskeletal:        General: Tenderness and deformity (Deformity of right wrist from prior injury) present.     Right lower leg: No edema.     Left lower leg: No edema.     Comments: Patient with an area of tenderness to palpation in the right low back above the ischial spine.  Patient with mild bilateral lumbar paraspinous spasm.  Patient with tenderness over the area of the greater trochanter of left hip.  Patient with positive left seated leg raise.  Skin:    General: Skin is  warm and dry.  Neurological:     Mental Status: He is alert and oriented to person, place, and time.     Comments: Patient with 4 out of 5 strength in the left lower extremity  Psychiatric:        Mood and Affect: Mood normal.        Behavior: Behavior normal.     BP (!) 151/84 (BP Location: Left Arm, Patient Position: Sitting)   Pulse 71   Temp 98.2 F (36.8 C)   Ht _0  (1.854 m)   Wt 191 lb (86.6 kg)   SpO2 98%   BMI 25.20 kg/m  Wt Readings from Last 3 Encounters:  04/08/20 191 lb 3.2 oz (86.7 kg)  01/29/20 183 lb 9.6 oz (83.3 kg)  11/13/19 187 lb 3.2 oz (84.9 kg)     Health Maintenance Due  Topic Date Due  . URINE MICROALBUMIN  11/10/2018  . INFLUENZA VACCINE  01/21/2020      Lab Results  Component Value Date   TSH 3.710 01/29/2020   Lab Results  Component Value Date   WBC 4.8 01/29/2020   HGB 13.8 01/29/2020   HCT 40.6 01/29/2020   MCV 89 01/29/2020   PLT 268 01/29/2020   Lab Results  Component Value Date   NA 139 01/29/2020   K 4.6 01/29/2020   CO2 23 01/29/2020   GLUCOSE 124 (H) 01/29/2020   BUN 18 01/29/2020   CREATININE 1.05 01/29/2020   BILITOT 0.3 01/29/2020   ALKPHOS 80 01/29/2020   AST 10 01/29/2020   ALT 9 01/29/2020   PROT 6.9 01/29/2020   ALBUMIN 4.6 01/29/2020   CALCIUM 9.5 01/29/2020   ANIONGAP 17 (H) 05/30/2014   Lab Results  Component Value Date   CHOL 189 01/29/2020   Lab Results  Component Value Date   HDL 58 01/29/2020   Lab Results  Component Value Date   LDLCALC 109 (H) 01/29/2020   Lab Results  Component Value Date   TRIG 123 01/29/2020   Lab Results  Component Value Date   CHOLHDL 3.3 01/29/2020   Lab Results  Component Value Date   HGBA1C 6.9 (H) 01/29/2020      Assessment & Plan:  1. Left hip pain Patient with left hip pain complaining he does have some tenderness to palpation along the outside of the greater trochanter of the hip and on review of chart he has had past x-ray showing osteoarthritis  of the hips but arthritis was actually worse in the right hip.  Discussed with patient that I believe he may have lumbar radiculopathy or spinal stenosis as the cause of pain that is radiating from the hip area and numbness in the lower leg.  Tramadol provided for pain as patient has taken excessive amounts of Tylenol without relief and he cannot take nonsteroidal anti-inflammatories due to use of anticoagulant medication as he has history of CVA and atrial fibrillation. - traMADol (ULTRAM) 50 MG tablet; Take 1 tablet (50 mg total) by mouth every 6 (six) hours as needed for up to 5 days.  Dispense: 20 tablet; Refill: 0  2. Numbness in left leg Patient with new onset of numbness in the left lower leg x2 months along with sensation of weakness in the left leg and pain which radiates from the left hip.  Discussed with patient that I believe that he may have lumbar radiculopathy or spinal stenosis.  Prescription provided for gabapentin 100 mg which he may take 1 pill. up to 3 times daily as needed for numbness. - gabapentin (NEURONTIN) 100 MG capsule; Take 1 capsule (100 mg total) by mouth 3 (three) times daily. Needed for numbness  Dispense: 90 capsule; Refill: 3  3. Lumbar radiculopathy Discussed with patient that I suspect that his symptoms may be due to lumbar radiculopathy or spinal stenosis as he has had recent onset of radiation of pain from the left hip area as well as numbness in the left lower leg and foot.  He also has had difficulty walking in upright position due to his back pain.  He has been provided with prescription for tramadol which she is taken in the past for pain as well as prescription for short taper of prednisone and gabapentin to help with the numbness.  He has been asked to keep the upcoming appointment which he has scheduled for this Monday with his primary care physician for evaluation of his pain and patient will likely need further evaluation such as possible lumbar MRI.  He has  had evidence of left hip osteoarthritis on prior imaging on review of chart.  Educational material on radicular pain and spinal stenosis provided to patient as part of after visit summary. - traMADol (ULTRAM) 50 MG tablet; Take 1 tablet (50 mg total) by mouth every 6 (six) hours as needed for up to 5 days.  Dispense: 20 tablet; Refill: 0 - predniSONE (DELTASONE) 20 MG tablet; 2 pills once daily x2 days then 1 pill daily x2 days then half pill daily x4 days.  Take after eating  Dispense: 8 tablet; Refill: 0 - gabapentin (NEURONTIN) 100 MG capsule; Take 1 capsule (100 mg total) by mouth 3 (three) times daily. Needed for numbness  Dispense: 90 capsule; Refill: 3  4.  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  Continue monitoring of blood sugars, low-carb diet and increase water intake.     Follow-up: Return for keep Monday appt with Dr. [name].   Antony Blackbird, MD

## 2020-04-19 NOTE — Telephone Encounter (Signed)
Pt. Was given appt. Today at 10:50 AM; stated he will try to arrange a ride to the clinic.

## 2020-04-19 NOTE — Telephone Encounter (Signed)
Patient was seen today in clinic and was told to also keep his follow-up appointment on Monday with Dr. [name]

## 2020-04-21 ENCOUNTER — Encounter: Payer: Self-pay | Admitting: Dermatology

## 2020-04-22 ENCOUNTER — Ambulatory Visit: Payer: Medicare HMO | Admitting: Family Medicine

## 2020-04-23 ENCOUNTER — Ambulatory Visit: Payer: Medicare HMO | Admitting: Podiatry

## 2020-04-23 ENCOUNTER — Ambulatory Visit (INDEPENDENT_AMBULATORY_CARE_PROVIDER_SITE_OTHER): Payer: Medicare HMO | Admitting: Podiatry

## 2020-04-23 ENCOUNTER — Encounter: Payer: Self-pay | Admitting: Podiatry

## 2020-04-23 ENCOUNTER — Other Ambulatory Visit: Payer: Self-pay

## 2020-04-23 DIAGNOSIS — M79674 Pain in right toe(s): Secondary | ICD-10-CM

## 2020-04-23 DIAGNOSIS — E1149 Type 2 diabetes mellitus with other diabetic neurological complication: Secondary | ICD-10-CM

## 2020-04-23 DIAGNOSIS — B351 Tinea unguium: Secondary | ICD-10-CM

## 2020-04-23 DIAGNOSIS — D689 Coagulation defect, unspecified: Secondary | ICD-10-CM | POA: Diagnosis not present

## 2020-04-23 DIAGNOSIS — M79675 Pain in left toe(s): Secondary | ICD-10-CM

## 2020-04-23 NOTE — Progress Notes (Signed)
This patient returns to my office for at risk foot care.  This patient requires this care by a professional since this patient will be at risk due to having diabetes.  This patient is unable to cut nails himself since the patient cannot reach his nails.These nails are painful walking and wearing shoes.  This patient presents for at risk foot care today.  General Appearance  Alert, conversant and in no acute stress.  Vascular  Dorsalis pedis and posterior tibial  pulses are palpable  bilaterally.  Capillary return is within normal limits  bilaterally. Temperature is within normal limits  bilaterally.  Neurologic  Senn-Weinstein monofilament wire test within normal limits  bilaterally. Muscle power within normal limits bilaterally.  Nails Thick disfigured discolored nails with subungual debris  from hallux to fifth toes bilaterally. No evidence of bacterial infection or drainage bilaterally.  Orthopedic  No limitations of motion  feet .  No crepitus or effusions noted.  No bony pathology or digital deformities noted.  Skin  normotropic skin with no porokeratosis noted bilaterally.  No signs of infections or ulcers noted.     Onychomycosis  Pain in right toes  Pain in left toes  Porokeratosis sub5th met  B/L asymptomatic.  Consent was obtained for treatment procedures.   Mechanical debridement of nails 1-5  bilaterally performed with a nail nipper.  Filed with dremel without incident.     Return office visit   3 months                  Told patient to return for periodic foot care and evaluation due to potential at risk complications.   Gardiner Barefoot DPM

## 2020-04-25 ENCOUNTER — Ambulatory Visit (HOSPITAL_BASED_OUTPATIENT_CLINIC_OR_DEPARTMENT_OTHER): Payer: Medicare HMO | Admitting: Family Medicine

## 2020-04-25 ENCOUNTER — Ambulatory Visit: Payer: Self-pay

## 2020-04-25 ENCOUNTER — Ambulatory Visit: Payer: Medicare HMO | Attending: Internal Medicine | Admitting: Pharmacist

## 2020-04-25 ENCOUNTER — Encounter: Payer: Self-pay | Admitting: Family Medicine

## 2020-04-25 ENCOUNTER — Other Ambulatory Visit: Payer: Self-pay

## 2020-04-25 DIAGNOSIS — Z20822 Contact with and (suspected) exposure to covid-19: Secondary | ICD-10-CM | POA: Diagnosis not present

## 2020-04-25 DIAGNOSIS — Z03818 Encounter for observation for suspected exposure to other biological agents ruled out: Secondary | ICD-10-CM | POA: Diagnosis not present

## 2020-04-25 DIAGNOSIS — Z1159 Encounter for screening for other viral diseases: Secondary | ICD-10-CM | POA: Diagnosis not present

## 2020-04-25 DIAGNOSIS — Z23 Encounter for immunization: Secondary | ICD-10-CM

## 2020-04-25 DIAGNOSIS — J069 Acute upper respiratory infection, unspecified: Secondary | ICD-10-CM

## 2020-04-25 NOTE — Progress Notes (Signed)
Virtual Visit via Telephone Note  I connected with  on 04/25/20 at  3:50 PM EDT by telephone and verified that I am speaking with the correct person using two identifiers.   I discussed the limitations, risks, security and privacy concerns of performing an evaluation and management service by telephone and the availability of in person appointments. I also discussed with the patient that there may be a patient responsible charge related to this service. The patient expressed understanding and agreed to proceed.  Patient Location: Home Provider Location: [facility] Office Others participating in call: none   History of Present Illness:      69 year old male with complaint of recent onset of cough which has been mostly nonproductive but occasional white mucus/phlegm.  He denies any fever or chills.No headache or dizziness.  No sore throat.  He did have testing for influenza and COVID-19 and both tests were negative.  He reports that he also has nasal congestion and postnasal drainage.  He started the use of over-the-counter Coricidin yesterday and states that since then he has not had any additional cough and he also feels as if his nasal congestion has improved.  He feels as if his symptoms overall are improving.                         Past Medical History:  Diagnosis Date  . Abscess of anal and rectal regions 07/15/2007   Qualifier: Diagnosis of  By: Amil Amen MD, Benjamine Mola    . Atrial fibrillation ([place])   . Diabetes mellitus without complication ([place]) Dx 1478  . FISTULA, ANAL 01/27/2007   Qualifier: Diagnosis of  By: Amil Amen MD, Benjamine Mola    . Hypertension Dx 2008  . Prostate cancer ([place])   . Stroke [facility])     Past Surgical History:  Procedure Laterality Date  . CATARACT EXTRACTION  06/2017  . CYSTOSCOPY  05/2017  . PROSTATE BIOPSY      Family History  Problem Relation Age of Onset  . Diabetes Mother   . Breast cancer Mother   . Diabetes Father   . Breast cancer Sister   . Prostate  cancer Neg Hx   . Colon cancer Neg Hx   . Pancreatic cancer Neg Hx     Social History   Tobacco Use  . Smoking status: Former Smoker    Packs/day: 0.25    Years: 5.00    Pack years: 1.25    Types: Cigarettes    Quit date: 05/25/2014    Years since quitting: 5.9  . Smokeless tobacco: Never Used  . Tobacco comment: Quit 05/15/14  Vaping Use  . Vaping Use: Never used  Substance Use Topics  . Alcohol use: No    Alcohol/week: 0.0 standard drinks  . Drug use: No     Allergies  Allergen Reactions  . Lisinopril Swelling       Observations/Objective: No vital signs or physical exam conducted as visit was done via telephone  Assessment and Plan: 1. URI with cough and congestion Patient is encouraged to continue the use of over-the-counter Coricidin to help with his congestion.  If cough recurs, he may take over-the-counter Robitussin-DM.  Neither the Coricidin nor Robitussin will interfere with patient's hypertension.  He should call or return to the clinic if he has worsening cough, shortness of breath or any concerns.  If he has difficulty breathing, fever or any other symptoms he may go to the emergency department for further evaluation.  Follow Up Instructions:Return for keep scheduled f/u with PCP-Johnson; sooner if needed.    I discussed the assessment and treatment plan with the patient. The patient was provided an opportunity to ask questions and all were answered. The patient agreed with the plan and demonstrated an understanding of the instructions.   The patient was advised to call back or seek an in-person evaluation if the symptoms worsen or if the condition fails to improve as anticipated.  I provided 7 minutes of non-face-to-face time during this encounter.   Antony Blackbird, MD

## 2020-04-25 NOTE — Progress Notes (Signed)
Patient presents for vaccination against influenza per orders of Dr. Johnson. Consent given. Counseling provided. No contraindications exists. Vaccine administered without incident.  ° °Luke Van Ausdall, PharmD, CPP °Clinical Pharmacist °[facility] °[phone] ° °

## 2020-04-25 NOTE — Telephone Encounter (Signed)
Returned call to patient who states that he needs to know if he can take OTC corceden HBP for cough and congestion he is experiencing.  He states that his symptoms started with runny nose but that symptom has gone. He states that his cough has improved but he is reluctant to take a deep breath because it causes him to cough. His chest is tight. He states that the phlegm is white/ clear. He denies fever, loss of taste or smell, no ear problems. He has been vaccinated for COVID-19 and has had his booster. Flu vaccine given at office today. Patient states that he has signed himself up for COVID-19 test today. Per protocol virtual visit is scheduled today.   Reason for Disposition . [1] MILD difficulty breathing (e.g., minimal/no SOB at rest, SOB with walking, pulse <100) AND [2] still present when not coughing  Answer Assessment - Initial Assessment Questions 1. ONSET: "When did the cough begin?"      yesterday 2. SEVERITY: "How bad is the cough today?"      Not coughing 3. SPUTUM: "Describe the color of your sputum" (none, dry cough; clear, white, yellow, green)    White clear 4. HEMOPTYSIS: "Are you coughing up any blood?" If so ask: "How much?" (flecks, streaks, tablespoons, etc.)    no 5. DIFFICULTY BREATHING: "Are you having difficulty breathing?" If Yes, ask: "How bad is it?" (e.g., mild, moderate, severe)    - MILD: No SOB at rest, mild SOB with walking, speaks normally in sentences, can lay down, no retractions, pulse < 100.    - MODERATE: SOB at rest, SOB with minimal exertion and prefers to sit, cannot lie down flat, speaks in phrases, mild retractions, audible wheezing, pulse 100-120.    - SEVERE: Very SOB at rest, speaks in single words, struggling to breathe, sitting hunched forward, retractions, pulse > 120     Can't take deep breath 6. FEVER: "Do you have a fever?" If Yes, ask: "What is your temperature, how was it measured, and when did it start?"    No 7. CARDIAC HISTORY: "Do you  have any history of heart disease?" (e.g., heart attack, congestive heart failure)     No  Stroke 8. LUNG HISTORY: "Do you have any history of lung disease?"  (e.g., pulmonary embolus, asthma, emphysema)     no 9. PE RISK FACTORS: "Do you have a history of blood clots?" (or: recent major surgery, recent prolonged travel, bedridden)    no 10. OTHER SYMPTOMS: "Do you have any other symptoms?" (e.g., runny nose, wheezing, chest pain)      Chest tightness runny nose is gone 11. PREGNANCY: "Is there any chance you are pregnant?" "When was your last menstrual period?"      N/A 12. TRAVEL: "Have you traveled out of the country in the last month?" (e.g., travel history, exposures)       no  Protocols used: [place]

## 2020-04-25 NOTE — Progress Notes (Signed)
SOB AND NASAL CONGESTION RAPID COVID AND FLU TEST NEGATIVE BACK IS FEELING BETTER

## 2020-05-06 ENCOUNTER — Telehealth: Payer: Self-pay | Admitting: Internal Medicine

## 2020-05-06 NOTE — Telephone Encounter (Signed)
Copied from [place] [id number]. Topic: General - Other >> May 06, 2020  2:31 PM Rainey Pines A wrote: Patient would like a callback from Dr. [name] nurse in regards to getting a chest xray ordered for patient. Please advise

## 2020-05-06 NOTE — Telephone Encounter (Signed)
Returned pt call to see why he needing xray  Pt states he is needing the chest xray because he hasn't had one in years and he is having some chest congestion that has been going on for 2 weeks and a cough. Pt is requesting a xray

## 2020-05-07 NOTE — Telephone Encounter (Signed)
Pt is schedule for a tele on 11/18 with Dr. Leanne Chang

## 2020-05-09 ENCOUNTER — Encounter: Payer: Self-pay | Admitting: Internal Medicine

## 2020-05-09 ENCOUNTER — Other Ambulatory Visit: Payer: Self-pay

## 2020-05-09 ENCOUNTER — Ambulatory Visit: Payer: Medicare HMO | Attending: Internal Medicine | Admitting: Internal Medicine

## 2020-05-09 DIAGNOSIS — R058 Other specified cough: Secondary | ICD-10-CM | POA: Diagnosis not present

## 2020-05-09 NOTE — Progress Notes (Signed)
2 week hx of cough.   Virtual Visit via Video Note  I connected with [name] on 05/09/20 at  9:00 AM EST by a video enabled telemedicine application and verified that I am speaking with the correct person using two identifiers.     I discussed the limitations of evaluation and management by telemedicine and the availability of in person appointments. The patient expressed understanding and agreed to proceed.      I discussed the assessment and treatment plan with the patient. The patient was provided an opportunity to ask questions and all were answered. The patient agreed with the plan and demonstrated an understanding of the instructions.   The patient was advised to call back or seek an in-person evaluation if the symptoms worsen or if the condition fails to improve as anticipated.  I provided 22 minutes of non-face-to-face time during this encounter.   Was seen at an [facility] and tested for covid and flu. Both negative. He has had 3 covid injections.   Initially he had a cough and felt like someone was standing on his chest. The cough and pressure have now gone away. He has no fever or chills  Past Medical History:  Diagnosis Date  . Abscess of anal and rectal regions 07/15/2007   Qualifier: Diagnosis of  By: Amil Amen MD, Benjamine Mola    . Atrial fibrillation ([place])   . Diabetes mellitus without complication ([place]) Dx 1497  . FISTULA, ANAL 01/27/2007   Qualifier: Diagnosis of  By: Amil Amen MD, Benjamine Mola    . Hypertension Dx 2008  . Prostate cancer ([place])   . Stroke [facility])     Social History   Socioeconomic History  . Marital status: Married    Spouse name: Not on file  . Number of children: 5  . Years of education: Not on file  . Highest education level: Not on file  Occupational History  . Occupation: Caregiver    Comment: retired  Tobacco Use  . Smoking status: Former Smoker    Packs/day: 0.25    Years: 5.00    Pack years: 1.25    Types: Cigarettes    Quit  date: 05/25/2014    Years since quitting: 5.9  . Smokeless tobacco: Never Used  . Tobacco comment: Quit 05/15/14  Vaping Use  . Vaping Use: Never used  Substance and Sexual Activity  . Alcohol use: No    Alcohol/week: 0.0 standard drinks  . Drug use: No  . Sexual activity: Not Currently  Other Topics Concern  . Not on file  Social History Narrative   Lives with his wife   5 children, grown.   7 grandchildren.    Social Determinants of Health   Financial Resource Strain:   . Difficulty of Paying Living Expenses: Not on file  Food Insecurity:   . Worried About Charity fundraiser in the Last Year: Not on file  . Ran Out of Food in the Last Year: Not on file  Transportation Needs:   . Lack of Transportation (Medical): Not on file  . Lack of Transportation (Non-Medical): Not on file  Physical Activity:   . Days of Exercise per Week: Not on file  . Minutes of Exercise per Session: Not on file  Stress:   . Feeling of Stress : Not on file  Social Connections:   . Frequency of Communication with Friends and Family: Not on file  . Frequency of Social Gatherings with Friends and Family: Not on file  .  Attends Religious Services: Not on file  . Active Member of Clubs or Organizations: Not on file  . Attends Archivist Meetings: Not on file  . Marital Status: Not on file  Intimate Partner Violence:   . Fear of Current or Ex-Partner: Not on file  . Emotionally Abused: Not on file  . Physically Abused: Not on file  . Sexually Abused: Not on file    Past Surgical History:  Procedure Laterality Date  . CATARACT EXTRACTION  06/2017  . CYSTOSCOPY  05/2017  . PROSTATE BIOPSY      Family History  Problem Relation Age of Onset  . Diabetes Mother   . Breast cancer Mother   . Diabetes Father   . Breast cancer Sister   . Prostate cancer Neg Hx   . Colon cancer Neg Hx   . Pancreatic cancer Neg Hx     Allergies  Allergen Reactions  . Lisinopril Swelling    Current  Outpatient Medications on File Prior to Visit  Medication Sig Dispense Refill  . amLODipine (NORVASC) 10 MG tablet Take 1 tablet (10 mg total) by mouth daily. 90 tablet 1  . Blood Glucose Monitoring Suppl (TRUE METRIX METER) w/Device KIT Use as directed 1 kit 0  . diclofenac Sodium (VOLTAREN) 1 % GEL APPLY 2 GRAMS TOPICALLY FOUR TIMES DAILY    . ELIQUIS 5 MG TABS tablet TAKE 1 TABLET BY MOUTH TWICE DAILY. 60 tablet 2  . fluticasone (FLONASE) 50 MCG/ACT nasal spray Place 1 spray into both nostrils daily as needed for allergies or rhinitis. 16 g 1  . gabapentin (NEURONTIN) 100 MG capsule Take 1 capsule (100 mg total) by mouth 3 (three) times daily. Needed for numbness 90 capsule 3  . GlucoCom Lancets MISC 100 Units by Does not apply route once. 100 each 0  . glucose blood (TRUE METRIX BLOOD GLUCOSE TEST) test strip Use as instructed 100 each 12  . hydrOXYzine (ATARAX/VISTARIL) 25 MG tablet TAKE 1 TABLET (25 MG TOTAL) BY MOUTH 2 (TWO) TIMES DAILY AS NEEDED. 30 tablet 2  . Multiple Vitamins-Minerals (MENS MULTIVITAMIN PLUS PO) Take 1 tablet by mouth daily.    . pravastatin (PRAVACHOL) 10 MG tablet Take 1 tablet (10 mg total) by mouth daily. 30 tablet 5   No current facility-administered medications on file prior to visit.     patient denies chest pain, shortness of breath, orthopnea. Denies lower extremity edema, abdominal pain, change in appetite, change in bowel movements. Patient denies rashes, musculoskeletal complaints. No other specific complaints in a complete review of systems.   There were no vitals taken for this visit. Virtual visit.   No exam- I could hear patient coughing while on phone  A/p- presumed URI- has had cough occasionally productive of purulent sputum. . But sxs now for two weeks. I'll check CXR to r/o PNA.  I have counseled patinet that URI sxs can last 4-6 weeks.

## 2020-05-09 NOTE — Progress Notes (Signed)
Patient verified DOB Patient has not eaten today Patient complains of productive cough with brown mucous. Patient has used tussin with no relief Patient complains of symptoms being present for 2 weeks, Patient denies fever N/V. Patient denies HA's. Patient received flu vaccine and booster 2 weeks ago.

## 2020-05-10 ENCOUNTER — Other Ambulatory Visit: Payer: Self-pay

## 2020-05-10 ENCOUNTER — Emergency Department (HOSPITAL_COMMUNITY): Payer: Medicare HMO

## 2020-05-10 ENCOUNTER — Telehealth: Payer: Self-pay

## 2020-05-10 ENCOUNTER — Encounter (HOSPITAL_COMMUNITY): Payer: Self-pay | Admitting: Emergency Medicine

## 2020-05-10 ENCOUNTER — Inpatient Hospital Stay (HOSPITAL_COMMUNITY)
Admission: EM | Admit: 2020-05-10 | Discharge: 2020-05-16 | DRG: 164 | Disposition: A | Discharge status: Home / Self Care | Payer: Medicare HMO | Source: Ambulatory Visit | Attending: Internal Medicine | Admitting: Internal Medicine

## 2020-05-10 ENCOUNTER — Ambulatory Visit (HOSPITAL_COMMUNITY)
Admission: RE | Admit: 2020-05-10 | Discharge: 2020-05-10 | Disposition: A | Discharge status: Home / Self Care | Payer: Medicare HMO | Source: Ambulatory Visit | Attending: Internal Medicine | Admitting: Internal Medicine

## 2020-05-10 DIAGNOSIS — I129 Hypertensive chronic kidney disease with stage 1 through stage 4 chronic kidney disease, or unspecified chronic kidney disease: Secondary | ICD-10-CM | POA: Diagnosis present

## 2020-05-10 DIAGNOSIS — Z7901 Long term (current) use of anticoagulants: Secondary | ICD-10-CM

## 2020-05-10 DIAGNOSIS — J9311 Primary spontaneous pneumothorax: Secondary | ICD-10-CM

## 2020-05-10 DIAGNOSIS — I6930 Unspecified sequelae of cerebral infarction: Secondary | ICD-10-CM

## 2020-05-10 DIAGNOSIS — I444 Left anterior fascicular block: Secondary | ICD-10-CM | POA: Diagnosis present

## 2020-05-10 DIAGNOSIS — R058 Other specified cough: Secondary | ICD-10-CM

## 2020-05-10 DIAGNOSIS — Z888 Allergy status to other drugs, medicaments and biological substances status: Secondary | ICD-10-CM | POA: Diagnosis not present

## 2020-05-10 DIAGNOSIS — N179 Acute kidney failure, unspecified: Secondary | ICD-10-CM | POA: Diagnosis not present

## 2020-05-10 DIAGNOSIS — Z9889 Other specified postprocedural states: Secondary | ICD-10-CM

## 2020-05-10 DIAGNOSIS — Z803 Family history of malignant neoplasm of breast: Secondary | ICD-10-CM | POA: Diagnosis not present

## 2020-05-10 DIAGNOSIS — F17210 Nicotine dependence, cigarettes, uncomplicated: Secondary | ICD-10-CM | POA: Diagnosis present

## 2020-05-10 DIAGNOSIS — J439 Emphysema, unspecified: Secondary | ICD-10-CM | POA: Diagnosis present

## 2020-05-10 DIAGNOSIS — M19131 Post-traumatic osteoarthritis, right wrist: Secondary | ICD-10-CM

## 2020-05-10 DIAGNOSIS — M5416 Radiculopathy, lumbar region: Secondary | ICD-10-CM

## 2020-05-10 DIAGNOSIS — Z8546 Personal history of malignant neoplasm of prostate: Secondary | ICD-10-CM

## 2020-05-10 DIAGNOSIS — E1165 Type 2 diabetes mellitus with hyperglycemia: Secondary | ICD-10-CM | POA: Diagnosis present

## 2020-05-10 DIAGNOSIS — J9383 Other pneumothorax: Principal | ICD-10-CM

## 2020-05-10 DIAGNOSIS — Z9114 Patient's other noncompliance with medication regimen: Secondary | ICD-10-CM | POA: Diagnosis not present

## 2020-05-10 DIAGNOSIS — E785 Hyperlipidemia, unspecified: Secondary | ICD-10-CM | POA: Diagnosis not present

## 2020-05-10 DIAGNOSIS — Z833 Family history of diabetes mellitus: Secondary | ICD-10-CM

## 2020-05-10 DIAGNOSIS — E1122 Type 2 diabetes mellitus with diabetic chronic kidney disease: Secondary | ICD-10-CM | POA: Diagnosis present

## 2020-05-10 DIAGNOSIS — Z716 Tobacco abuse counseling: Secondary | ICD-10-CM | POA: Diagnosis not present

## 2020-05-10 DIAGNOSIS — E1140 Type 2 diabetes mellitus with diabetic neuropathy, unspecified: Secondary | ICD-10-CM | POA: Diagnosis present

## 2020-05-10 DIAGNOSIS — I4891 Unspecified atrial fibrillation: Secondary | ICD-10-CM | POA: Diagnosis not present

## 2020-05-10 DIAGNOSIS — N182 Chronic kidney disease, stage 2 (mild): Secondary | ICD-10-CM | POA: Diagnosis present

## 2020-05-10 DIAGNOSIS — M19031 Primary osteoarthritis, right wrist: Secondary | ICD-10-CM | POA: Diagnosis not present

## 2020-05-10 DIAGNOSIS — I10 Essential (primary) hypertension: Secondary | ICD-10-CM | POA: Diagnosis not present

## 2020-05-10 DIAGNOSIS — Z79899 Other long term (current) drug therapy: Secondary | ICD-10-CM | POA: Diagnosis not present

## 2020-05-10 DIAGNOSIS — Z9119 Patient's noncompliance with other medical treatment and regimen: Secondary | ICD-10-CM

## 2020-05-10 DIAGNOSIS — I69354 Hemiplegia and hemiparesis following cerebral infarction affecting left non-dominant side: Secondary | ICD-10-CM

## 2020-05-10 DIAGNOSIS — I480 Paroxysmal atrial fibrillation: Secondary | ICD-10-CM | POA: Diagnosis present

## 2020-05-10 DIAGNOSIS — R091 Pleurisy: Secondary | ICD-10-CM | POA: Diagnosis not present

## 2020-05-10 DIAGNOSIS — J948 Other specified pleural conditions: Secondary | ICD-10-CM | POA: Diagnosis not present

## 2020-05-10 DIAGNOSIS — J90 Pleural effusion, not elsewhere classified: Secondary | ICD-10-CM | POA: Diagnosis not present

## 2020-05-10 DIAGNOSIS — I517 Cardiomegaly: Secondary | ICD-10-CM | POA: Diagnosis not present

## 2020-05-10 DIAGNOSIS — R200 Anesthesia of skin: Secondary | ICD-10-CM

## 2020-05-10 DIAGNOSIS — K449 Diaphragmatic hernia without obstruction or gangrene: Secondary | ICD-10-CM | POA: Diagnosis not present

## 2020-05-10 DIAGNOSIS — Z20822 Contact with and (suspected) exposure to covid-19: Secondary | ICD-10-CM | POA: Diagnosis present

## 2020-05-10 DIAGNOSIS — J9811 Atelectasis: Secondary | ICD-10-CM | POA: Diagnosis not present

## 2020-05-10 DIAGNOSIS — J984 Other disorders of lung: Secondary | ICD-10-CM | POA: Diagnosis not present

## 2020-05-10 DIAGNOSIS — K219 Gastro-esophageal reflux disease without esophagitis: Secondary | ICD-10-CM | POA: Diagnosis present

## 2020-05-10 DIAGNOSIS — J939 Pneumothorax, unspecified: Principal | ICD-10-CM

## 2020-05-10 LAB — CBC WITH DIFFERENTIAL/PLATELET
Abs Immature Granulocytes: 0.01 10*3/uL (ref 0.00–0.07)
Basophils Absolute: 0 10*3/uL (ref 0.0–0.1)
Basophils Relative: 1 %
Eosinophils Absolute: 0.1 10*3/uL (ref 0.0–0.5)
Eosinophils Relative: 2 %
HCT: 42.6 % (ref 39.0–52.0)
Hemoglobin: 14.2 g/dL (ref 13.0–17.0)
Immature Granulocytes: 0 %
Lymphocytes Relative: 30 %
Lymphs Abs: 1.5 10*3/uL (ref 0.7–4.0)
MCH: 30.2 pg (ref 26.0–34.0)
MCHC: 33.3 g/dL (ref 30.0–36.0)
MCV: 90.6 fL (ref 80.0–100.0)
Monocytes Absolute: 0.3 10*3/uL (ref 0.1–1.0)
Monocytes Relative: 5 %
Neutro Abs: 3.1 10*3/uL (ref 1.7–7.7)
Neutrophils Relative %: 62 %
Platelets: 251 10*3/uL (ref 150–400)
RBC: 4.7 MIL/uL (ref 4.22–5.81)
RDW: 13.4 % (ref 11.5–15.5)
WBC: 5 10*3/uL (ref 4.0–10.5)
nRBC: 0 % (ref 0.0–0.2)

## 2020-05-10 LAB — COMPREHENSIVE METABOLIC PANEL
ALT: 13 U/L (ref 0–44)
AST: 19 U/L (ref 15–41)
Albumin: 3.6 g/dL (ref 3.5–5.0)
Alkaline Phosphatase: 72 U/L (ref 38–126)
Anion gap: 10 (ref 5–15)
BUN: 14 mg/dL (ref 8–23)
CO2: 23 mmol/L (ref 22–32)
Calcium: 9.1 mg/dL (ref 8.9–10.3)
Chloride: 105 mmol/L (ref 98–111)
Creatinine, Ser: 1.25 mg/dL — ABNORMAL HIGH (ref 0.61–1.24)
GFR, Estimated: >60 mL/min (ref >60)
Glucose, Bld: 199 mg/dL — ABNORMAL HIGH (ref 70–99)
Potassium: 4.2 mmol/L (ref 3.5–5.1)
Sodium: 138 mmol/L (ref 135–145)
Total Bilirubin: 0.8 mg/dL (ref 0.3–1.2)
Total Protein: 7 g/dL (ref 6.5–8.1)

## 2020-05-10 LAB — CBG MONITORING, ED: Glucose-Capillary: 138 mg/dL — ABNORMAL HIGH (ref 70–99)

## 2020-05-10 LAB — RESPIRATORY PANEL BY RT PCR (FLU A&B, COVID)
Influenza A by PCR: NEGATIVE
Influenza B by PCR: NEGATIVE
SARS Coronavirus 2 by RT PCR: NEGATIVE

## 2020-05-10 LAB — HEMOGLOBIN A1C
Hgb A1c MFr Bld: 7.2 % — ABNORMAL HIGH (ref 4.8–5.6)
Mean Plasma Glucose: 159.94 mg/dL

## 2020-05-10 LAB — PROTIME-INR
INR: 0.9 (ref 0.8–1.2)
Prothrombin Time: 12.2 seconds (ref 11.4–15.2)

## 2020-05-10 MED ORDER — HYDROXYZINE HCL 25 MG PO TABS: 25.0000 mg | ORAL_TABLET | Freq: Two times a day (BID) | ORAL | Status: DC | PRN

## 2020-05-10 MED ORDER — ADULT MULTIVITAMIN W/MINERALS CH
1.0000 | ORAL_TABLET | Freq: Every day | ORAL | Status: DC
  Administered 2020-05-11 – 2020-05-16 (×6): 1 via ORAL
  Filled 2020-05-10 (×6): qty 1

## 2020-05-10 MED ORDER — INSULIN ASPART 100 UNIT/ML ~~LOC~~ SOLN: 0.0000 [IU] | Freq: Every day | SUBCUTANEOUS | Status: DC

## 2020-05-10 MED ORDER — PRAVASTATIN SODIUM 10 MG PO TABS
10.0000 mg | ORAL_TABLET | Freq: Every day | ORAL | Status: DC
  Administered 2020-05-10 – 2020-05-16 (×7): 10 mg via ORAL
  Filled 2020-05-10 (×7): qty 1

## 2020-05-10 MED ORDER — GABAPENTIN 100 MG PO CAPS
100.0000 mg | ORAL_CAPSULE | Freq: Every day | ORAL | Status: DC
  Administered 2020-05-11 – 2020-05-15 (×3): 100 mg via ORAL
  Filled 2020-05-10 (×6): qty 1

## 2020-05-10 MED ORDER — ONDANSETRON HCL 4 MG/2ML IJ SOLN: 4.0000 mg | Freq: Four times a day (QID) | INTRAMUSCULAR | Status: DC | PRN

## 2020-05-10 MED ORDER — ACETAMINOPHEN 325 MG PO TABS
650.0000 mg | ORAL_TABLET | Freq: Four times a day (QID) | ORAL | Status: DC | PRN
  Administered 2020-05-11: 650 mg via ORAL
  Filled 2020-05-10: qty 2

## 2020-05-10 MED ORDER — B COMPLEX-C PO TABS
1.0000 | ORAL_TABLET | Freq: Every day | ORAL | Status: DC
  Administered 2020-05-11 – 2020-05-16 (×6): 1 via ORAL
  Filled 2020-05-10 (×6): qty 1

## 2020-05-10 MED ORDER — SODIUM CHLORIDE 0.9 % IV SOLN: INTRAVENOUS | Status: DC

## 2020-05-10 MED ORDER — INSULIN ASPART 100 UNIT/ML ~~LOC~~ SOLN: 0.0000 [IU] | SUBCUTANEOUS | Status: DC

## 2020-05-10 MED ORDER — AMLODIPINE BESYLATE 5 MG PO TABS
5.0000 mg | ORAL_TABLET | Freq: Every day | ORAL | Status: DC
  Filled 2020-05-10: qty 1

## 2020-05-10 MED ORDER — HEPARIN (PORCINE) 25000 UT/250ML-% IV SOLN
1300.0000 [IU]/h | INTRAVENOUS | Status: DC
  Administered 2020-05-10: 1200 [IU]/h via INTRAVENOUS
  Administered 2020-05-12: 1300 [IU]/h via INTRAVENOUS
  Filled 2020-05-10 (×3): qty 250

## 2020-05-10 MED ORDER — INSULIN ASPART 100 UNIT/ML ~~LOC~~ SOLN: 0.0000 [IU] | Freq: Three times a day (TID) | SUBCUTANEOUS | Status: DC

## 2020-05-10 NOTE — ED Notes (Signed)
Pt returned from ct

## 2020-05-10 NOTE — ED Notes (Signed)
Admitting doctor at  The bedside 

## 2020-05-10 NOTE — ED Notes (Signed)
Report called to rn on 4e 

## 2020-05-10 NOTE — ED Triage Notes (Addendum)
Pt arrives for further eval and treatment after having cxray that revealed "L pneumothorax at 40% with partial collapse of Left lung" resp e/u, O2 sat 99% on room air, pt speaking in full sentences, nad. Pt requesting to leave while in triage, pt this RN advised it was imperative that patient stay and be seen.

## 2020-05-10 NOTE — ED Notes (Signed)
Pt asking for ice chips  Dr long does not want the pt ti  Have ice chips now

## 2020-05-10 NOTE — Telephone Encounter (Signed)
X-ray report noted.  Pt currently in ER.

## 2020-05-10 NOTE — H&P (Addendum)
History and Physical  JULIOCESAR Travis ZDG:[id number] DOB: 02-07-1951 DOA: 05/10/2020  Referring physician: Dr. Laverta Baltimore  PCP: Scott Pier, MD  Outpatient Specialists:  Patient coming from: Home through PCPs office.  Chief Complaint: Chest discomfort and shortness of breath.  HPI: [name] is a 69 y.o. male with medical history significant for paroxysmal A. fib on Eliquis, essential hypertension, prostate cancer, prior CVA with left-sided weakness who presented to [facility] ED at his PCPs request due to newly found pneumothorax on chest x-ray.  Initially presented to his PCP 2 weeks prior to presentation with a productive cough and chest congestion.  Was advised to take over-the-counter cough medication.  Developed chest discomfort and shortness of breath around that time.  He felt like someone was sitting on his chest.  He requested a pulmonary referral and had a televisit.  A chest x-ray was ordered which showed left pneumothorax.  Was sent to the ED for further evaluation and management.  At the time of this visit, he states his dyspnea has improved.  He denies any chest pain.  A CT scan chest with no contrast done in the ED showed the following findings: 1. Large anterior left-sided pneumothorax. 2. Moderate severity left lower lobe and posterior left upper lobe scarring and/or atelectasis with bilateral subcentimeter noncalcified lung nodules. Correlation with three-month follow-up chest CT is recommended to determine stability, as an underlying neoplastic process cannot be excluded. 3. Small left pleural effusion. 4. Mild emphysematous lung disease and bullous changes are seen involving predominantly the bilateral upper lobes. 5. Small hiatal hernia. 6. Emphysema. Cardiothoracic surgery, Dr. [name], was consulted by EDP.  TRH, hospitalist team, was asked to admit.  ED Course: BP elevated, respiratory rate 21-30, fluctuating.  Lab studies essentially unremarkable.  Review  of Systems: Review of systems as noted in the HPI. All other systems reviewed and are negative.   Past Medical History:  Diagnosis Date  . Abscess of anal and rectal regions 07/15/2007   Qualifier: Diagnosis of  By: Amil Amen MD, Benjamine Mola    . Atrial fibrillation ([place])   . Diabetes mellitus without complication ([place]) Dx 5956  . FISTULA, ANAL 01/27/2007   Qualifier: Diagnosis of  By: Amil Amen MD, Benjamine Mola    . Hypertension Dx 2008  . Prostate cancer ([place])   . Stroke [facility])    Past Surgical History:  Procedure Laterality Date  . CATARACT EXTRACTION  06/2017  . CYSTOSCOPY  05/2017  . PROSTATE BIOPSY      Social History:  reports that he quit smoking about 5 years ago. His smoking use included cigarettes. He has a 1.25 pack-year smoking history. He has never used smokeless tobacco. He reports that he does not drink alcohol and does not use drugs.   Allergies  Allergen Reactions  . Lisinopril Swelling    Throat swelling    Family History  Problem Relation Age of Onset  . Diabetes Mother   . Breast cancer Mother   . Diabetes Father   . Breast cancer Sister   . Prostate cancer Neg Hx   . Colon cancer Neg Hx   . Pancreatic cancer Neg Hx     Father with history of CABG.  Prior to Admission medications   Medication Sig Start Date End Date Taking? Authorizing Provider  amLODipine (NORVASC) 10 MG tablet Take 1 tablet (10 mg total) by mouth daily. 11/14/19  Yes Scott Pier, MD  B Complex Vitamins (VITAMIN B COMPLEX) TABS Take 1 tablet by mouth  daily.   Yes [provider]  bisacodyl (DULCOLAX) 5 MG EC tablet Take 5 mg by mouth daily as needed (constipation).   Yes [provider]  diclofenac Sodium (VOLTAREN) 1 % GEL Apply 1 application topically 3 (three) times daily as needed (pain).  04/19/17  Yes [provider]  ELIQUIS 5 MG TABS tablet TAKE 1 TABLET BY MOUTH TWICE DAILY. Patient taking differently: Take 5 mg by mouth 2 (two) times daily.   02/19/20  Yes Scott Pier, MD  fluticasone (FLONASE) 50 MCG/ACT nasal spray Place 1 spray into both nostrils daily as needed for allergies or rhinitis. 07/27/19  Yes Scott Pier, MD  gabapentin (NEURONTIN) 100 MG capsule Take 1 capsule (100 mg total) by mouth 3 (three) times daily. Needed for numbness Patient taking differently: Take 100 mg by mouth daily at 12 noon. for numbness 04/19/20  Yes Fulp, Cammie, MD  guaiFENesin (TUSSIN EXPECTORANT PO) Take 20 mLs by mouth every 6 (six) hours as needed (congestion).   Yes [provider]  HYDROcodone-acetaminophen (NORCO) 10-325 MG tablet Take 1 tablet by mouth daily as needed (pain).   Yes [provider]  hydrOXYzine (ATARAX/VISTARIL) 25 MG tablet TAKE 1 TABLET (25 MG TOTAL) BY MOUTH 2 (TWO) TIMES DAILY AS NEEDED. Patient taking differently: Take 25 mg by mouth 2 (two) times daily as needed (tingling pain).  01/08/20  Yes Scott Pier, MD  Multiple Vitamin (MULTIVITAMIN WITH MINERALS) TABS tablet Take 1 tablet by mouth daily.   Yes [provider]  Blood Glucose Monitoring Suppl (TRUE METRIX METER) w/Device KIT Use as directed 05/11/16   Boykin Nearing, MD  GlucoCom Lancets MISC 100 Units by Does not apply route once. 03/13/15   Funches, Adriana Mccallum, MD  glucose blood (TRUE METRIX BLOOD GLUCOSE TEST) test strip Use as instructed 05/11/16   Boykin Nearing, MD  pravastatin (PRAVACHOL) 10 MG tablet Take 1 tablet (10 mg total) by mouth daily. Patient not taking: Reported on 05/10/2020 01/31/20   Scott Pier, MD    Physical Exam: BP (!) 132/98   Pulse 77   Temp 98.1 F (36.7 C) (Oral)   Resp 17   Ht 6' 1"  (1.854 m)   Wt 86.6 kg   SpO2 93%   BMI 25.20 kg/m   . General: 69 y.o. year-old male well developed well nourished in no acute distress.  Alert and oriented x3. . Cardiovascular: Regular rate and rhythm with no rubs or gallops.  No thyromegaly or JVD noted.  Trace lower extremity edema  bilaterally. Marland Kitchen Respiratory: Clear to auscultation with no wheezes or rales. Good inspiratory effort. . Abdomen: Soft nontender nondistended with normal bowel sounds x4 quadrants. . Muskuloskeletal: No cyanosis or clubbing.  Trace lower extremity edema noted bilaterally . Neuro: CN II-XII intact, sensation, reflexes . Skin: No ulcerative lesions noted or rashes . Psychiatry: Judgement and insight appear normal. Mood is appropriate for condition and setting          Labs on Admission:  Basic Metabolic Panel: Recent Labs  Lab 05/10/20 1614  NA 138  K 4.2  CL 105  CO2 23  GLUCOSE 199*  BUN 14  CREATININE 1.25*  CALCIUM 9.1   Liver Function Tests: Recent Labs  Lab 05/10/20 1614  AST 19  ALT 13  ALKPHOS 72  BILITOT 0.8  PROT 7.0  ALBUMIN 3.6   No results for input(s): LIPASE, AMYLASE in the last 168 hours. No results for input(s): AMMONIA in the last  168 hours. CBC: Recent Labs  Lab 05/10/20 1614  WBC 5.0  NEUTROABS 3.1  HGB 14.2  HCT 42.6  MCV 90.6  PLT 251   Cardiac Enzymes: No results for input(s): CKTOTAL, CKMB, CKMBINDEX, TROPONINI in the last 168 hours.  BNP (last 3 results) No results for input(s): BNP in the last 8760 hours.  ProBNP (last 3 results) No results for input(s): PROBNP in the last 8760 hours.  CBG: No results for input(s): GLUCAP in the last 168 hours.  Radiological Exams on Admission: DG Chest 2 View  Addendum Date: 05/10/2020   ADDENDUM REPORT: 05/10/2020 14:32 ADDENDUM: Critical Value/emergent results were called by telephone at the time of interpretation on 05/10/2020 at 2:31 pm to provider CMA Rutherford Nail, who verbally acknowledged these results. She was informed of the patient's transfer to the emergency department. Electronically Signed   By: Richardean Sale M.D.   On: 05/10/2020 14:32   Result Date: 05/10/2020 CLINICAL DATA:  Left-sided chest pain with cough and shortness of breath for 2 weeks. No acute injury. EXAM: CHEST - 2 VIEW  COMPARISON:  Chest radiographs 06/09/2014. FINDINGS: The heart size and mediastinal contours are stable without evidence of mediastinal shift. There is a new sizable left-sided pneumothorax estimated at approximately 40%. There is partial collapse of the left lung. The right lung is clear. There is no mediastinal shift or significant pleural effusion. No evidence of acute fracture. Mild thoracic spondylosis noted. IMPRESSION: New sizable left-sided pneumothorax estimated at approximately 40% with partial collapse of the left lung but no mediastinal shift. While attempting to reach the ordering provider with these results, this outpatient was taken to the emergency department at [facility]. Electronically Signed: By: Richardean Sale M.D. On: 05/10/2020 14:15   DG Wrist Complete Right  Result Date: 05/10/2020 CLINICAL DATA:  Ongoing wrist pain for years. EXAM: RIGHT WRIST - COMPLETE 3+ VIEW COMPARISON:  Radiographs 09/04/2017. 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 No evidence of developing avascular necrosis. There is a stable chronic fracture of the 5th metacarpal neck. Mild radiocarpal and intercarpal degenerative changes are stable. No evidence of acute fracture or dislocation. 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 Chronic fracture of the 5th metacarpal neck. No acute osseous findings. Electronically Signed   By: Richardean Sale M.D.   On: 05/10/2020 14:10   CT Chest Wo Contrast  Result Date: 05/10/2020 CLINICAL DATA:  Pneumothorax revealed on plain. EXAM: CT CHEST WITHOUT CONTRAST TECHNIQUE: Multidetector CT imaging of the chest was performed following the standard protocol without IV contrast. COMPARISON:  None. FINDINGS: Cardiovascular: No significant vascular findings. Normal heart size. No pericardial effusion. Mediastinum/Nodes: No  enlarged mediastinal or axillary lymph nodes. Thyroid gland, trachea, and esophagus demonstrate no significant findings. Lungs/Pleura: Mild emphysematous lung disease and bullous changes are seen involving predominantly the bilateral upper lobes. Moderate severity scarring and/or atelectasis is seen within the left lower lobe and posterior aspect of the left upper lobe. A 5 mm noncalcified lung nodule is seen within the lateral aspect of the right upper lobe (axial CT image 49, CT series number 4). A 6 mm noncalcified lung nodule is noted within the anterior aspect of the right middle lobe (axial CT image 100, CT series number 4). A 6 mm noncalcified lung nodule is seen within the posterior aspect of the left upper lobe (axial CT image 75, CT series number 4). A  large anterior left-sided pneumothorax is seen. This measures approximately 10.2 cm in maximum AP measurement and extends from the left apex of the left lung base. A small amount of pleural fluid is seen on the left. Upper Abdomen: There is a small hiatal hernia. Musculoskeletal: Degenerative changes seen throughout the thoracic spine. IMPRESSION: 1. Large anterior left-sided pneumothorax. 2. Moderate severity left lower lobe and posterior left upper lobe scarring and/or atelectasis with bilateral subcentimeter noncalcified lung nodules. Correlation with three-month follow-up chest CT is recommended to determine stability, as an underlying neoplastic process cannot be excluded. 3. Small left pleural effusion. 4. Mild emphysematous lung disease and bullous changes are seen involving predominantly the bilateral upper lobes. 5. Small hiatal hernia. 6. Emphysema. Emphysema (ICD10-J43.9). Electronically Signed   By: Virgina Norfolk M.D.   On: 05/10/2020 19:39    EKG: I independently viewed the EKG done and my findings are as followed: Sinus rhythm rate of 72.  Nonspecific ST-T changes  Assessment/Plan Present on Admission: . Pneumothorax  Active  Problems:   Pneumothorax  Spontaneous left-sided anterior pneumothorax Initially presented at his PCPs office with complaints of productive cough and chest congestion.  Around the same time had chest discomfort and shortness of breath.  Had a chest x-ray done outpatient which showed pneumothorax.  Was sent to the ED for further evaluation and management. CT chest noncontrast revealed left-sided anterior pneumothorax. CTS Dr. [name] was consulted by EDP with plan for surgical procedure.  Recommended to hold off Eliquis for 2 days We'll start heparin drip for A. fib anticoagulation coverage. Currently not hypoxic with O2 saturation 97% on room air Start continuous pulse oximetry Repeat chest x-ray in the morning, follow  Paroxysmal A. fib Currently in sinus rhythm He is on Norvasc 10 mg daily On Eliquis for secondary CVA prevention, Eliquis held in anticipation for surgical procedure by CTS as stated above Closely monitor on telemetry  Type 2 diabetes with hyperglycemia Obtain A1c Start insulin sliding scale  Neuropathy Resume home gabapentin  Essential hypertension Resume home Norvasc  Hyperlipidemia Resume home Pravachol  CKD 2 Creatinine 1.25 with GFR greater than 60 Avoid nephrotoxins Gentle IV fluid hydration Monitor urine output Repeat BMP in the morning  Tobacco use disorder Tobacco cessation counseling  Medication noncompliance Patient reports he was taking Eliquis only once a day Patient stopped taking Pravachol >2 months ago because he ran out He also stopped taking any diabetic medications months ago     DVT prophylaxis: Heparin drip  Code Status: Full code as stated by the patient himself  Family Communication: None at bedside.  Disposition Plan: Admit to progressive unit  Consults called: Cardiothoracic surgery consulted by EDP.  Admission status: Inpatient.  Patient will require at least 2 midnights for further evaluation and treatment of  present condition.   Status is: Inpatient    Dispo:  Patient From: Home  Planned Disposition: Home  Expected discharge date: 05/13/20  Medically stable for discharge: No, ongoing management of spontaneous anterior left pneumothorax.        Kayleen Memos MD [facility] Pager [phone]  If 7PM-7AM, please contact night-coverage [email or web address] Password [facility]  05/10/2020, 8:15 PM

## 2020-05-10 NOTE — Telephone Encounter (Signed)
[facility] radiology called w/critical xray results, pt PCP has been notified

## 2020-05-10 NOTE — ED Provider Notes (Signed)
[facility] EMERGENCY DEPARTMENT Provider Note   CSN: [id number] Arrival date & time: 05/10/20  1412     History Chief Complaint  Patient presents with  . Chest Injury    [name] is a 69 y.o. male.  HPI      [name] is a 69 y.o. male, with a history of A. fib, DM, HTN, stroke, presenting to the ED with pneumothorax on chest x-ray.  Patient states he began coughing about 2 weeks ago accompanied by some chest discomfort.  A chest x-ray was ordered for the patient by an outside physician.  This x-ray showed 40% left pneumothorax.  Patient was sent to the ED. He has had 3 Covid vaccination injections. Patient denies shortness of breath, current chest discomfort, dizziness, syncope, lower extremity edema, fever/chills, or any other complaints.  Past Medical History:  Diagnosis Date  . Abscess of anal and rectal regions 07/15/2007   Qualifier: Diagnosis of  By: Amil Amen MD, Benjamine Mola    . Atrial fibrillation ([place])   . Diabetes mellitus without complication ([place]) Dx 7893  . FISTULA, ANAL 01/27/2007   Qualifier: Diagnosis of  By: Amil Amen MD, Benjamine Mola    . Hypertension Dx 2008  . Prostate cancer ([place])   . Stroke [facility])     Patient Active Problem List   Diagnosis Date Noted  . Blood clotting disorder ([place]) 04/23/2020  . Stressful life event affecting family 01/29/2020  . Hyperlipidemia associated with type 2 diabetes mellitus ([place]) 01/29/2020  . Personal history of fall 01/29/2020  . 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 Unintended weight loss 01/29/2020  . Pain due to onychomycosis of toenails of both feet 08/01/2019  . Porokeratosis 08/01/2019  . Influenza vaccine needed 02/24/2019  . Diabetes mellitus ([place]) 02/24/2019  . Malignant neoplasm of prostate ([place]) 09/23/2018  . Hyperlipemia 11/24/2016  . History of CVA with residual deficit 11/24/2016  . GERD (gastroesophageal reflux disease) 08/09/2014  . Atrial  fibrillation ([place])   . Hemiparesis affecting left side as late effect of stroke ([place]) 05/01/2014  . 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 DEGENERATIVE JOINT DISEASE, CERVICAL SPINE 04/24/2009  . DM2 (diabetes mellitus, type 2) ([place]) 07/03/2006  . Former smoker 07/03/2006  . HIATAL HERNIA WITH REFLUX 07/03/2006  . Essential hypertension 06/08/2006    Past Surgical History:  Procedure Laterality Date  . CATARACT EXTRACTION  06/2017  . CYSTOSCOPY  05/2017  . PROSTATE BIOPSY         Family History  Problem Relation Age of Onset  . Diabetes Mother   . Breast cancer Mother   . Diabetes Father   . Breast cancer Sister   . Prostate cancer Neg Hx   . Colon cancer Neg Hx   . Pancreatic cancer Neg Hx     Social History   Tobacco Use  . Smoking status: Former Smoker    Packs/day: 0.25    Years: 5.00    Pack years: 1.25    Types: Cigarettes    Quit date: 05/25/2014    Years since quitting: 5.9  . Smokeless tobacco: Never Used  . Tobacco comment: Quit 05/15/14  Vaping Use  . Vaping Use: Never used  Substance Use Topics  . Alcohol use: No    Alcohol/week: 0.0 standard drinks  . Drug use: No    Home Medications Prior to Admission medications   Medication Sig Start Date End Date Taking? Authorizing Provider  amLODipine (NORVASC) 10 MG tablet Take 1 tablet (  10 mg total) by mouth daily. 11/14/19  Yes [name], MD  B Complex Vitamins (VITAMIN B COMPLEX) TABS Take 1 tablet by mouth daily.   Yes [provider]  bisacodyl (DULCOLAX) 5 MG EC tablet Take 5 mg by mouth daily as needed (constipation).   Yes [provider]  diclofenac Sodium (VOLTAREN) 1 % GEL Apply 1 application topically 3 (three) times daily as needed (pain).  04/19/17  Yes [provider]  ELIQUIS 5 MG TABS tablet TAKE 1 TABLET BY MOUTH TWICE DAILY. Patient taking differently: Take 5 mg by mouth 2 (two) times daily.  02/19/20  Yes [name], MD  fluticasone  (FLONASE) 50 MCG/ACT nasal spray Place 1 spray into both nostrils daily as needed for allergies or rhinitis. 07/27/19  Yes [name], MD  gabapentin (NEURONTIN) 100 MG capsule Take 1 capsule (100 mg total) by mouth 3 (three) times daily. Needed for numbness Patient taking differently: Take 100 mg by mouth daily at 12 noon. for numbness 04/19/20  Yes Fulp, Cammie, MD  guaiFENesin (TUSSIN EXPECTORANT PO) Take 20 mLs by mouth every 6 (six) hours as needed (congestion).   Yes [provider]  HYDROcodone-acetaminophen (NORCO) 10-325 MG tablet Take 1 tablet by mouth daily as needed (pain).   Yes [provider]  hydrOXYzine (ATARAX/VISTARIL) 25 MG tablet TAKE 1 TABLET (25 MG TOTAL) BY MOUTH 2 (TWO) TIMES DAILY AS NEEDED. Patient taking differently: Take 25 mg by mouth 2 (two) times daily as needed (tingling pain).  01/08/20  Yes [name], MD  Multiple Vitamin (MULTIVITAMIN WITH MINERALS) TABS tablet Take 1 tablet by mouth daily.   Yes [provider]  Blood Glucose Monitoring Suppl (TRUE METRIX METER) w/Device KIT Use as directed 05/11/16   Boykin Nearing, MD  GlucoCom Lancets MISC 100 Units by Does not apply route once. 03/13/15   Funches, Adriana Mccallum, MD  glucose blood (TRUE METRIX BLOOD GLUCOSE TEST) test strip Use as instructed 05/11/16   Boykin Nearing, MD  pravastatin (PRAVACHOL) 10 MG tablet Take 1 tablet (10 mg total) by mouth daily. Patient not taking: Reported on 05/10/2020 01/31/20   [name], MD    Allergies    Lisinopril  Review of Systems   Review of Systems  Constitutional: Negative for chills and fever.  Respiratory: Positive for cough. Negative for shortness of breath.        Pneumothorax on chest x-ray  Cardiovascular: Negative for chest pain and leg swelling.  Gastrointestinal: Negative for abdominal pain, nausea and vomiting.  Neurological: Negative for dizziness, syncope and weakness.  All other systems reviewed and are  negative.   Physical Exam Updated Vital Signs BP (!) 155/88   Pulse 65   Temp 98.1 F (36.7 C) (Oral)   Resp (!) 22   Ht _0  (1.854 m)   Wt 86.6 kg   SpO2 97%   BMI 25.20 kg/m   Physical Exam Vitals and nursing note reviewed.  Constitutional:      General: He is not in acute distress.    Appearance: He is well-developed. He is not diaphoretic.  HENT:     Head: Normocephalic and atraumatic.     Mouth/Throat:     Mouth: Mucous membranes are moist.     Pharynx: Oropharynx is clear.  Eyes:     Conjunctiva/sclera: Conjunctivae normal.  Cardiovascular:     Rate and Rhythm: Normal rate and regular rhythm.     Pulses: Normal pulses.  Radial pulses are 2+ on the right side and 2+ on the left side.       Posterior tibial pulses are 2+ on the right side and 2+ on the left side.     Heart sounds: Normal heart sounds.     Comments: Tactile temperature in the extremities appropriate and equal bilaterally. Pulmonary:     Effort: Pulmonary effort is normal. No respiratory distress.     Breath sounds: Examination of the left-upper field reveals decreased breath sounds. Examination of the left-middle field reveals decreased breath sounds. Decreased breath sounds present.     Comments: No increased work of breathing.  Speaks in full sentences without difficulty. Abdominal:     Palpations: Abdomen is soft.     Tenderness: There is no abdominal tenderness. There is no guarding.  Musculoskeletal:     Cervical back: Neck supple.     Right lower leg: No edema.     Left lower leg: No edema.  Skin:    General: Skin is warm and dry.  Neurological:     Mental Status: He is alert.  Psychiatric:        Mood and Affect: Mood and affect normal.        Speech: Speech normal.        Behavior: Behavior normal.     ED Results / Procedures / Treatments   Labs (all labs ordered are listed, but only abnormal results are displayed) Labs Reviewed  COMPREHENSIVE METABOLIC PANEL -  Abnormal; Notable for the following components:      Result Value   Glucose, Bld 199 (*)    Creatinine, Ser 1.25 (*)    All other components within normal limits  RESPIRATORY PANEL BY RT PCR (FLU A&B, COVID)  CBC WITH DIFFERENTIAL/PLATELET  PROTIME-INR    BUN  Date Value Ref Range Status  05/10/2020 14 8 - 23 mg/dL Final  01/29/2020 18 8 - 27 mg/dL Final  03/01/2019 15 8 - 27 mg/dL Final  06/09/2018 21 8 - 27 mg/dL Final  11/09/2017 17 8 - 27 mg/dL Final   Creatinine, Ser  Date Value Ref Range Status  05/10/2020 1.25 (H) 0.61 - 1.24 mg/dL Final  01/29/2020 1.05 0.76 - 1.27 mg/dL Final  03/01/2019 1.30 (H) 0.76 - 1.27 mg/dL Final  06/09/2018 1.29 (H) 0.76 - 1.27 mg/dL Final     EKG EKG Interpretation  Date/Time:  Friday May 10 2020 16:46:17 EST Ventricular Rate:  72 PR Interval:    QRS Duration: 93 QT Interval:  401 QTC Calculation: 439 R Axis:   -74 Text Interpretation: Sinus rhythm Left anterior fascicular block Anterior infarct, old No STEMI Confirmed by Nanda Quinton [phone]) on 05/10/2020 4:52:10 PM   Radiology DG Chest 2 View  Addendum Date: 05/10/2020   ADDENDUM REPORT: 05/10/2020 14:32 ADDENDUM: Critical Value/emergent results were called by telephone at the time of interpretation on 05/10/2020 at 2:31 pm to provider CMA Rutherford Nail, who verbally acknowledged these results. She was informed of the patient's transfer to the emergency department. Electronically Signed   By: Richardean Sale M.D.   On: 05/10/2020 14:32   Result Date: 05/10/2020 CLINICAL DATA:  Left-sided chest pain with cough and shortness of breath for 2 weeks. No acute injury. EXAM: CHEST - 2 VIEW COMPARISON:  Chest radiographs 06/09/2014. FINDINGS: The heart size and mediastinal contours are stable without evidence of mediastinal shift. There is a new sizable left-sided pneumothorax estimated at approximately 40%. There is partial collapse of the left lung. The  right lung is clear. There is no  mediastinal shift or significant pleural effusion. No evidence of acute fracture. Mild thoracic spondylosis noted. IMPRESSION: New sizable left-sided pneumothorax estimated at approximately 40% with partial collapse of the left lung but no mediastinal shift. While attempting to reach the ordering provider with these results, this outpatient was taken to the emergency department at [facility]. Electronically Signed: By: Richardean Sale M.D. On: 05/10/2020 14:15   DG Wrist Complete Right  Result Date: 05/10/2020 CLINICAL DATA:  Ongoing wrist pain for years. EXAM: RIGHT WRIST - COMPLETE 3+ VIEW COMPARISON:  Radiographs 09/04/2017. 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 No evidence of developing avascular necrosis. There is a stable chronic fracture of the 5th metacarpal neck. Mild radiocarpal and intercarpal degenerative changes are stable. No evidence of acute fracture or dislocation. 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 Chronic fracture of the 5th metacarpal neck. No acute osseous findings. Electronically Signed   By: Richardean Sale M.D.   On: 05/10/2020 14:10    Procedures .Critical Care Performed by: Lorayne Bender, PA-C Authorized by: Lorayne Bender, PA-C   Critical care provider statement:    Critical care time (minutes):  35   Critical care time was exclusive of:  Separately billable procedures and treating other patients   Critical care was necessary to treat or prevent imminent or life-threatening deterioration of the following conditions: Pneumothorax.   Critical care was time spent personally by me on the following activities:  Ordering and performing treatments and interventions, ordering and review of laboratory studies, ordering and review of radiographic studies, re-evaluation of patient's condition, discussions with  consultants, development of treatment plan with patient or surrogate, obtaining history from patient or surrogate and examination of patient   I assumed direction of critical care for this patient from another provider in my specialty: no     (including critical care time)  Medications Ordered in ED Medications - No data to display  ED Course  I have reviewed the triage vital signs and the nursing notes.  Pertinent labs & imaging results that were available during my care of the patient were reviewed by me and considered in my medical decision making (see chart for details).  Clinical Course as of May 11 1851  Ludwig Clarks May 10, 2020  1640 Spoke with Thurmond Butts, [place] with cardiothoracic surgery.  She spoke with Dr. [name]. Recommends ordering chest CT without contrast to rule out blebs. Once chest CT results, speak with Dr. [name] to assure he agrees with the read. If no blebs, place pigtail chest tube.  Admit to hospitalist with cardiothoracic surgery consulting.   [SJ]    Clinical Course User Index [SJ] Shaquan Puerta, Helane Gunther, PA-C   MDM Rules/Calculators/A&P                          Patient presents with left-sided pneumothorax. Patient is nontoxic appearing, afebrile, not tachycardic, not tachypneic, not hypotensive, maintains excellent SPO2 on room air, and is in no apparent distress.   I have reviewed the patient's chart to obtain more information.   I reviewed and interpreted the patient's labs and radiological studies. Patient showing excellent stability under my care.  Findings and plan of care discussed with Gara Kroner, MD. Dr. Laverta Baltimore personally evaluated and examined this patient. At the end of my shift, Dr. Laverta Baltimore  continued care for this patient.  Vitals:   05/10/20 1615 05/10/20 1630 05/10/20 1645 05/10/20 1647  BP: (!) 154/98 (!) 156/99 (!) 155/95 (!) 155/95  Pulse: 77 74 78 76  Resp: _0 Temp:      TempSrc:      SpO2: 98% 97% 97% 97%  Weight:      Height:       Vitals:    05/10/20 1800 05/10/20 1815 05/10/20 1830 05/10/20 1845  BP: (!) 109/94 (!) 143/89 (!) 141/89 (!) 155/88  Pulse: 75 79 81 65  Resp: _1 (!) 22  Temp:      TempSrc:      SpO2: 93% 96% 97% 97%  Weight:      Height:         Final Clinical Impression(s) / ED Diagnoses Final diagnoses:  Pneumothorax, left    Rx / DC Orders ED Discharge Orders    None       Layla Maw 05/10/20 1852    Margette Fast, MD 05/10/20 2017

## 2020-05-10 NOTE — ED Notes (Signed)
Pt to ct 

## 2020-05-10 NOTE — Progress Notes (Signed)
ANTICOAGULATION CONSULT NOTE - Initial Consult  Pharmacy Consult for Eliquis to Heparin Indication: atrial fibrillation  Allergies  Allergen Reactions  . Lisinopril Swelling    Throat swelling    Patient Measurements: Height: 6\' 1"  (185.4 cm) Weight: 86.6 kg (191 lb) IBW/kg (Calculated) : 79.9  Vital Signs: Temp: 98 F (36.7 C) (11/19 2110) Temp Source: Oral (11/19 2110) BP: 151/90 (11/19 2100) Pulse Rate: 72 (11/19 2100)  Labs: Recent Labs    05/10/20 1614  HGB 14.2  HCT 42.6  PLT 251  LABPROT 12.2  INR 0.9  CREATININE 1.25*    Estimated Creatinine Clearance: 63.9 mL/min (A) (by C-G formula based on SCr of 1.25 mg/dL (H)).  Assessment: 69 year old male on Eliquis prior to admission for Afib.  Now to begin heparin while Eliquis on hold for surgery for pneumothorax  Last dose of Eliquis this morning at 11 am, will use PTTs to guide heparin dosing until PTTs and heparin level correlate  Goal of Therapy:  aPTT 66-102 seconds Monitor platelets by anticoagulation protocol: Yes  Heparin level 0.3-0.7   Plan:  Heparin at 1200 units / hr starting at 2300 pm Heparin level and PTT at 7 am Daily heparin level, PTT, CBC  Thank you Anette Guarneri, PharmD  05/10/2020,9:12 PM

## 2020-05-11 ENCOUNTER — Inpatient Hospital Stay (HOSPITAL_COMMUNITY): Payer: Medicare HMO

## 2020-05-11 DIAGNOSIS — J939 Pneumothorax, unspecified: Secondary | ICD-10-CM

## 2020-05-11 DIAGNOSIS — I10 Essential (primary) hypertension: Secondary | ICD-10-CM

## 2020-05-11 DIAGNOSIS — I480 Paroxysmal atrial fibrillation: Secondary | ICD-10-CM

## 2020-05-11 DIAGNOSIS — J9311 Primary spontaneous pneumothorax: Secondary | ICD-10-CM | POA: Diagnosis not present

## 2020-05-11 LAB — BASIC METABOLIC PANEL
Anion gap: 9 (ref 5–15)
BUN: 14 mg/dL (ref 8–23)
CO2: 21 mmol/L — ABNORMAL LOW (ref 22–32)
Calcium: 8.5 mg/dL — ABNORMAL LOW (ref 8.9–10.3)
Chloride: 108 mmol/L (ref 98–111)
Creatinine, Ser: 1.17 mg/dL (ref 0.61–1.24)
GFR, Estimated: >60 mL/min (ref >60)
Glucose, Bld: 123 mg/dL — ABNORMAL HIGH (ref 70–99)
Potassium: 3.9 mmol/L (ref 3.5–5.1)
Sodium: 138 mmol/L (ref 135–145)

## 2020-05-11 LAB — CBC
HCT: 39 % (ref 39.0–52.0)
Hemoglobin: 13 g/dL (ref 13.0–17.0)
MCH: 30.2 pg (ref 26.0–34.0)
MCHC: 33.3 g/dL (ref 30.0–36.0)
MCV: 90.7 fL (ref 80.0–100.0)
Platelets: 222 10*3/uL (ref 150–400)
RBC: 4.3 MIL/uL (ref 4.22–5.81)
RDW: 13.4 % (ref 11.5–15.5)
WBC: 4.3 10*3/uL (ref 4.0–10.5)
nRBC: 0 % (ref 0.0–0.2)

## 2020-05-11 LAB — HEPARIN LEVEL (UNFRACTIONATED): Heparin Unfractionated: 0.34 IU/mL (ref 0.30–0.70)

## 2020-05-11 LAB — APTT
aPTT: 50 seconds — ABNORMAL HIGH (ref 24–36)
aPTT: 77 seconds — ABNORMAL HIGH (ref 24–36)

## 2020-05-11 MED ORDER — INSULIN ASPART 100 UNIT/ML ~~LOC~~ SOLN: 0.0000 [IU] | Freq: Every day | SUBCUTANEOUS | Status: DC

## 2020-05-11 MED ORDER — INSULIN ASPART 100 UNIT/ML ~~LOC~~ SOLN
0.0000 [IU] | Freq: Three times a day (TID) | SUBCUTANEOUS | Status: DC
  Administered 2020-05-12: 3 [IU] via SUBCUTANEOUS
  Administered 2020-05-12 – 2020-05-15 (×6): 2 [IU] via SUBCUTANEOUS
  Administered 2020-05-15: 3 [IU] via SUBCUTANEOUS
  Administered 2020-05-15 – 2020-05-16 (×2): 2 [IU] via SUBCUTANEOUS

## 2020-05-11 MED ORDER — AMLODIPINE BESYLATE 10 MG PO TABS
10.0000 mg | ORAL_TABLET | Freq: Every day | ORAL | Status: DC
  Administered 2020-05-11 – 2020-05-16 (×6): 10 mg via ORAL
  Filled 2020-05-11 (×6): qty 1

## 2020-05-11 NOTE — Progress Notes (Signed)
[facility] for Eliquis to Heparin Indication: atrial fibrillation  Allergies  Allergen Reactions  . Lisinopril Swelling    Throat swelling    Patient Measurements: Height: 6\' 1"  (185.4 cm) Weight: 82.9 kg (182 lb 11.2 oz) IBW/kg (Calculated) : 79.9  Heparin dosing wt: 82.9kg  Vital Signs: Temp: 97.6 F (36.4 C) (11/20 1205) Temp Source: Oral (11/20 1205) BP: 148/88 (11/20 1205) Pulse Rate: 83 (11/20 1205)  Labs: Recent Labs    05/10/20 1614 05/11/20 0707 05/11/20 1430  HGB 14.2 13.0  --   HCT 42.6 39.0  --   PLT 251 222  --   APTT  --  50* 77*  LABPROT 12.2  --   --   INR 0.9  --   --   HEPARINUNFRC  --  0.34  --   CREATININE 1.25* 1.17  --     Estimated Creatinine Clearance: 68.3 mL/min (by C-G formula based on SCr of 1.17 mg/dL).  Assessment: 69 year old male on Eliquis prior to admission for Afib. Pharmacy consulted for transition to heparin for pneumothorax surgery.  Last dose of Eliquis this morning at 11 am, will use aPTTs to guide heparin dosing until PTTs and heparin level correlate  Repeat aPTT therapeutic at 77 seconds.   Goal of Therapy:  aPTT 66-102 seconds Monitor platelets by anticoagulation protocol: Yes  Heparin level 0.3-0.7   Plan:  -Continue heparin 1300 units/h -Daily heparin level, aPTT, CBC   Arrie Senate, PharmD, BCPS Clinical Pharmacist [phone] Please check AMION for all [facility] numbers 05/11/2020

## 2020-05-11 NOTE — Progress Notes (Signed)
ANTICOAGULATION CONSULT NOTE - Initial Consult  Pharmacy Consult for Eliquis to Heparin Indication: atrial fibrillation  Allergies  Allergen Reactions  . Lisinopril Swelling    Throat swelling    Patient Measurements: Height: 6\' 1"  (185.4 cm) Weight: 82.9 kg (182 lb 11.2 oz) IBW/kg (Calculated) : 79.9  Heparin dosing wt: 82.9kg  Vital Signs: Temp: 97.8 F (36.6 C) (11/19 2149) Temp Source: Oral (11/19 2149) BP: 151/90 (11/19 2100) Pulse Rate: 72 (11/19 2100)  Labs: Recent Labs    05/10/20 1614 05/11/20 0707  HGB 14.2 13.0  HCT 42.6 39.0  PLT 251 222  APTT  --  50*  LABPROT 12.2  --   INR 0.9  --   HEPARINUNFRC  --  0.34  CREATININE 1.25*  --     Estimated Creatinine Clearance: 63.9 mL/min (A) (by C-G formula based on SCr of 1.25 mg/dL (H)).  Assessment: 69 year old male on Eliquis prior to admission for Afib. Pharmacy consulted for transition to heparin for pneumothorax surgery.  Last dose of Eliquis this morning at 11 am, will use aPTTs to guide heparin dosing until PTTs and heparin level correlate  11/20 heparin level 0.34, aPTT 50, below goal. CBC stable. Will increase rate and recheck aPTT in 6 hours.  Goal of Therapy:  aPTT 66-102 seconds Monitor platelets by anticoagulation protocol: Yes  Heparin level 0.3-0.7   Plan:  - Increase heparin rate to 1,300 units/hr - Recheck aPTT in 6 hours, follow until correlating with HL and then monitor HL daily - Monitor CBC, signs of bleeding, plans for restarting oral AC after surgery   Mercy Riding, PharmD PGY1 Acute Care Pharmacy Resident Please refer to [facility] for unit-specific pharmacist

## 2020-05-11 NOTE — Consult Note (Signed)
[address]       [place] [address]             [phone]                    Macallister C Hudler [place] Medical Record #[id number] Date of Birth: Nov 09, 1950  Referring: No ref. provider found Primary Care: [name], MD Primary Cardiologist: No primary care provider on file.  Chief Complaint:    Chief Complaint  Patient presents with  . Chest Injury    History of Present Illness:    [name] 69 y.o. male admitted from the emergency department with a 2-week history of progressive dyspnea, and pleuritic chest pain.  He was seen in his primary care physician's office where a chest x-ray was ordered that demonstrated a left-sided pneumothorax.  Cross-sectional imaging revealed significant bullous disease involving his left upper lobe.  CT surgery was consulted to assist with management.    Smoking Hx: History of heavy cannabis use in the past. Currently smokes 2 to 3 cigarettes a day. Has smoked a pack a day for about 5 years.   Zubrod Score: At the time of surgery this patient's most appropriate activity status/level should be described as: [x]     0    Normal activity, no symptoms []     1    Restricted in physical strenuous activity but ambulatory, able to do out light work []     2    Ambulatory and capable of self care, unable to do work activities, up and about               >50 % of waking hours                              []     3    Only limited self care, in bed greater than 50% of waking hours []     4    Completely disabled, no self care, confined to bed or chair []     5    Moribund   Past Medical History:  Diagnosis Date  . Abscess of anal and rectal regions 07/15/2007   Qualifier: Diagnosis of  By: Amil Amen MD, Benjamine Mola    . Atrial fibrillation ([place])   . Diabetes mellitus without complication ([place]) Dx 0973  . FISTULA, ANAL 01/27/2007   Qualifier: Diagnosis of  By: Amil Amen MD, Benjamine Mola    . Hypertension Dx 2008  .  Prostate cancer ([place])   . Stroke [facility])     Past Surgical History:  Procedure Laterality Date  . CATARACT EXTRACTION  06/2017  . CYSTOSCOPY  05/2017  . PROSTATE BIOPSY      Family History  Problem Relation Age of Onset  . Diabetes Mother   . Breast cancer Mother   . Diabetes Father   . Breast cancer Sister   . Prostate cancer Neg Hx   . Colon cancer Neg Hx   . Pancreatic cancer Neg Hx      Social History   Tobacco Use  Smoking Status Former Smoker  . Packs/day: 0.25  . Years: 5.00  . Pack years: 1.25  . Types: Cigarettes  . Quit date: 05/25/2014  . Years since quitting: 5.9  Smokeless Tobacco Never Used  Tobacco Comment   Quit 05/15/14    Social History   Substance and Sexual Activity  Alcohol Use No  .  Alcohol/week: 0.0 standard drinks     Allergies  Allergen Reactions  . Lisinopril Swelling    Throat swelling    Current Facility-Administered Medications  Medication Dose Route Frequency Provider Last Rate Last Admin  . 0.9 %  sodium chloride infusion   Intravenous Continuous Kayleen Memos, DO 50 mL/hr at 05/10/20 2340 New Bag at 05/10/20 2340  . acetaminophen (TYLENOL) tablet 650 mg  650 mg Oral Q6H PRN Irene Pap N, DO      . amLODipine (NORVASC) tablet 10 mg  10 mg Oral Daily Irene Pap N, DO   10 mg at 05/11/20 0835  . B-complex with vitamin C tablet 1 tablet  1 tablet Oral Daily Kayleen Memos, DO   1 tablet at 05/11/20 8315  . gabapentin (NEURONTIN) capsule 100 mg  100 mg Oral Q1200 Irene Pap N, DO   100 mg at 05/11/20 1156  . heparin ADULT infusion 100 units/mL (25000 units/264mL sodium chloride 0.45%)  1,300 Units/hr Intravenous Continuous Amedeo Plenty, [place] 13 mL/hr at 05/11/20 0836 1,300 Units/hr at 05/11/20 0836  . hydrOXYzine (ATARAX/VISTARIL) tablet 25 mg  25 mg Oral BID PRN Kayleen Memos, DO      . insulin aspart (novoLOG) injection 0-9 Units  0-9 Units Subcutaneous Q4H Irene Pap N, DO   1 Units at 05/11/20 1205  . multivitamin  with minerals tablet 1 tablet  1 tablet Oral Daily Kayleen Memos, DO   1 tablet at 05/11/20 1761  . ondansetron (ZOFRAN) injection 4 mg  4 mg Intravenous Q6H PRN Irene Pap N, DO      . pravastatin (PRAVACHOL) tablet 10 mg  10 mg Oral Daily Irene Pap N, DO   10 mg at 05/11/20 6073    Review of Systems  Constitutional: Negative.   Respiratory: Positive for cough and shortness of breath.   Cardiovascular: Positive for chest pain.  Neurological: Negative.      PHYSICAL EXAMINATION: BP (!) 148/88 (BP Location: Right Arm)   Pulse 83   Temp 97.6 F (36.4 C) (Oral)   Resp 16   Ht 6\' 1"  (1.854 m)   Wt 82.9 kg   SpO2 96%   BMI 24.10 kg/m  Physical Exam Constitutional:      Appearance: Normal appearance.  HENT:     Head: Normocephalic and atraumatic.  Cardiovascular:     Rate and Rhythm: Normal rate and regular rhythm.  Pulmonary:     Effort: Pulmonary effort is normal. No respiratory distress.  Musculoskeletal:        General: Normal range of motion.     Cervical back: Normal range of motion.  Neurological:     General: No focal deficit present.     Mental Status: He is alert and oriented to person, place, and time.     Diagnostic Studies & Laboratory data:     Recent Radiology Findings:   DG Chest 2 View  Addendum Date: 05/10/2020   ADDENDUM REPORT: 05/10/2020 14:32 ADDENDUM: Critical Value/emergent results were called by telephone at the time of interpretation on 05/10/2020 at 2:31 pm to provider CMA Rutherford Nail, who verbally acknowledged these results. She was informed of the patient's transfer to the emergency department. Electronically Signed   By: Richardean Sale M.D.   On: 05/10/2020 14:32   Result Date: 05/10/2020 CLINICAL DATA:  Left-sided chest pain with cough and shortness of breath for 2 weeks. No acute injury. EXAM: CHEST - 2 VIEW COMPARISON:  Chest radiographs 06/09/2014. FINDINGS: The  heart size and mediastinal contours are stable without evidence of  mediastinal shift. There is a new sizable left-sided pneumothorax estimated at approximately 40%. There is partial collapse of the left lung. The right lung is clear. There is no mediastinal shift or significant pleural effusion. No evidence of acute fracture. Mild thoracic spondylosis noted. IMPRESSION: New sizable left-sided pneumothorax estimated at approximately 40% with partial collapse of the left lung but no mediastinal shift. While attempting to reach the ordering provider with these results, this outpatient was taken to the emergency department at [facility]. Electronically Signed: By: Richardean Sale M.D. On: 05/10/2020 14:15   DG Wrist Complete Right  Result Date: 05/10/2020 CLINICAL DATA:  Ongoing wrist pain for years. EXAM: RIGHT WRIST - COMPLETE 3+ VIEW COMPARISON:  Radiographs 09/04/2017. 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 No evidence of developing avascular necrosis. There is a stable chronic fracture of the 5th metacarpal neck. Mild radiocarpal and intercarpal degenerative changes are stable. No evidence of acute fracture or dislocation. 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 Chronic fracture of the 5th metacarpal neck. No acute osseous findings. Electronically Signed   By: Richardean Sale M.D.   On: 05/10/2020 14:10   CT Chest Wo Contrast  Result Date: 05/10/2020 CLINICAL DATA:  Pneumothorax revealed on plain. EXAM: CT CHEST WITHOUT CONTRAST TECHNIQUE: Multidetector CT imaging of the chest was performed following the standard protocol without IV contrast. COMPARISON:  None. FINDINGS: Cardiovascular: No significant vascular findings. Normal heart size. No pericardial effusion. Mediastinum/Nodes: No enlarged mediastinal or axillary lymph nodes. Thyroid gland, trachea, and esophagus demonstrate no significant findings.  Lungs/Pleura: Mild emphysematous lung disease and bullous changes are seen involving predominantly the bilateral upper lobes. Moderate severity scarring and/or atelectasis is seen within the left lower lobe and posterior aspect of the left upper lobe. A 5 mm noncalcified lung nodule is seen within the lateral aspect of the right upper lobe (axial CT image 49, CT series number 4). A 6 mm noncalcified lung nodule is noted within the anterior aspect of the right middle lobe (axial CT image 100, CT series number 4). A 6 mm noncalcified lung nodule is seen within the posterior aspect of the left upper lobe (axial CT image 75, CT series number 4). A large anterior left-sided pneumothorax is seen. This measures approximately 10.2 cm in maximum AP measurement and extends from the left apex of the left lung base. A small amount of pleural fluid is seen on the left. Upper Abdomen: There is a small hiatal hernia. Musculoskeletal: Degenerative changes seen throughout the thoracic spine. IMPRESSION: 1. Large anterior left-sided pneumothorax. 2. Moderate severity left lower lobe and posterior left upper lobe scarring and/or atelectasis with bilateral subcentimeter noncalcified lung nodules. Correlation with three-month follow-up chest CT is recommended to determine stability, as an underlying neoplastic process cannot be excluded. 3. Small left pleural effusion. 4. Mild emphysematous lung disease and bullous changes are seen involving predominantly the bilateral upper lobes. 5. Small hiatal hernia. 6. Emphysema. Emphysema (ICD10-J43.9). Electronically Signed   By: Virgina Norfolk M.D.   On: 05/10/2020 19:39   DG CHEST PORT 1 VIEW  Result Date: 05/11/2020 CLINICAL DATA:  Evaluate left-sided pneumothorax. EXAM: PORTABLE CHEST 1 VIEW COMPARISON:  Chest x-ray May 10, 2020. Chest CT May 10, 2020. FINDINGS: The large left-sided pneumothorax is essentially stable. No right-sided pneumothorax. The right lung is clear.  Mild opacity in the partially collapsed left lung was better appreciated on recent CT imaging, possibly scar atelectasis. Stable cardiomegaly. The hila and mediastinum are unchanged. No other acute abnormalities. IMPRESSION: 1. A large left-sided pneumothorax remains, not significantly changed. No shift of the heart or mediastinum to the right to suggest tension. 2. No other changes. Persistent opacity in the partially collapsed left lung. Electronically Signed   By: Dorise Bullion III M.D   On: 05/11/2020 09:52       I have independently reviewed the above radiology studies  and reviewed the findings with the patient.   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  He also has a history of atrial fibrillation and has been on Eliquis.  On review of his cross-sectional imaging he does have some dominant bulla in the left upper lobe, and has a persistent pneumothorax.  Given that he is symptomatic I have recommended 2 options: Image guided chest tube placement versus robotic assisted wedge resection, apical pleurectomy and mechanical pleurodesis.  The risks and benefits of each were discussed in great detail and the patient would like to proceed with surgical resection.  Given that he has been on Eliquis he will need a 2 to 3-day washout.  And he is tentatively scheduled as a second case on 11/22.     I  spent 40  minutes with  the patient face to face and greater then 50% of the time was spent in counseling and coordination of care.    Lajuana Matte 05/11/2020 12:11 PM

## 2020-05-11 NOTE — Progress Notes (Signed)
Patient ID: [name], male   DOB: May 15, 1951, 69 y.o.   MRN: [id number]  PROGRESS NOTE    DONNALD TABAR  WYO:[id number] DOB: 03-24-1951 DOA: 05/10/2020 PCP: [name], MD   Brief Narrative:  69 y.o. male with medical history significant for paroxysmal A. fib on Eliquis, essential hypertension, prostate cancer, prior unspecified CVA with left-sided weakness who presented to [facility] ED at his PCPs request due to newly found pneumothorax on chest x-ray.  On presentation, CT chest showed large anterior left-sided pneumothorax.  Cardiothoracic surgery/Dr. [name] was consulted who recommended admission to [facility] service.   Assessment & Plan:   Spontaneous left sided anterior pneumothorax -Initially presented at his PCPs office with complaints of productive cough and chest congestion.  Around the same time had chest discomfort and shortness of breath.  Had a chest x-ray done outpatient which showed pneumothorax.  Was sent to the ED for further evaluation and management. -CT chest noncontrast revealed left-sided anterior pneumothorax. -Evaluation by CT surgery/Dr. [name] is pending.  Last dose of Eliquis was yesterday on 05/10/2020. -Currently on room air and not tachypneic.  Paroxysmal A. Fib -Currently rate controlled.  Eliquis on hold.  Continue heparin drip.  Hypertension -Blood pressure on the high side.  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  A1c 7.2.  Continue gabapentin  Hyperlipidemia -Continue home Pravachol  CKD stage II -Creatinine stable.  Monitor  Tobacco use disorder -Was counseled by admitting provider regarding tobacco cessation  Medical noncompliance -Apparently takes Eliquis only once a day. Patient stopped taking Pravachol >2 months ago because he ran out -He also stopped taking any diabetic medications months ago  DVT prophylaxis: Heparin drip Code Status: Full Family  Communication: None at bedside Disposition Plan: Status is: Inpatient  Remains inpatient appropriate because:Inpatient level of care appropriate due to severity of illness   Dispo:  Patient From: Home  Planned Disposition: Home  Expected discharge date: 05/13/20  Medically stable for discharge: No  Consultants: CT surgery  Procedures: None  Antimicrobials: None  Subjective: Patient seen and examined at bedside.  Denies worsening shortness of breath, chest pain, fever or vomiting.  Objective: Vitals:   05/10/20 2100 05/10/20 2110 05/10/20 2149 05/11/20 0827  BP: (!) 151/90   (!) 149/95  Pulse: 72   68  Resp: 20   16  Temp:  98 F (36.7 C) 97.8 F (36.6 C) 97.7 F (36.5 C)  TempSrc:  Oral Oral Oral  SpO2: 95%  95% 98%  Weight:   82.9 kg   Height:   6\' 1"  (1.854 m)    No intake or output data in the 24 hours ending 05/11/20 1012 Filed Weights   05/10/20 1452 05/10/20 2149  Weight: 86.6 kg 82.9 kg    Examination:  General exam: Appears calm and comfortable. Respiratory system: Bilateral decreased breath sounds at bases Cardiovascular system: S1 & S2 heard, Rate controlled Gastrointestinal system: Abdomen is nondistended, soft and nontender. Normal bowel sounds heard. Extremities: No cyanosis, clubbing, edema  Central nervous system: Alert and oriented. No focal neurological deficits. Moving extremities Skin: No rashes, lesions or ulcers Psychiatry: Flat affect    Data Reviewed: I have personally reviewed following labs and imaging studies  CBC: Recent Labs  Lab 05/10/20 1614 05/11/20 0707  WBC 5.0 4.3  NEUTROABS 3.1  --   HGB 14.2 13.0  HCT 42.6 39.0  MCV 90.6 90.7  PLT 251 774   Basic Metabolic  Panel: Recent Labs  Lab 05/10/20 1614 05/11/20 0707  NA 138 138  K 4.2 3.9  CL 105 108  CO2 23 21*  GLUCOSE 199* 123*  BUN 14 14  CREATININE 1.25* 1.17  CALCIUM 9.1 8.5*   GFR: Estimated Creatinine Clearance: 68.3 mL/min (by C-G formula based on  SCr of 1.17 mg/dL). Liver Function Tests: Recent Labs  Lab 05/10/20 1614  AST 19  ALT 13  ALKPHOS 72  BILITOT 0.8  PROT 7.0  ALBUMIN 3.6   No results for input(s): LIPASE, AMYLASE in the last 168 hours. No results for input(s): AMMONIA in the last 168 hours. Coagulation Profile: Recent Labs  Lab 05/10/20 1614  INR 0.9   Cardiac Enzymes: No results for input(s): CKTOTAL, CKMB, CKMBINDEX, TROPONINI in the last 168 hours. BNP (last 3 results) No results for input(s): PROBNP in the last 8760 hours. HbA1C: Recent Labs    05/10/20 1614  HGBA1C 7.2*   CBG: Recent Labs  Lab 05/10/20 2105  GLUCAP 138*   Lipid Profile: No results for input(s): CHOL, HDL, LDLCALC, TRIG, CHOLHDL, LDLDIRECT in the last 72 hours. Thyroid Function Tests: No results for input(s): TSH, T4TOTAL, FREET4, T3FREE, THYROIDAB in the last 72 hours. Anemia Panel: No results for input(s): VITAMINB12, FOLATE, FERRITIN, TIBC, IRON, RETICCTPCT in the last 72 hours. Sepsis Labs: No results for input(s): PROCALCITON, LATICACIDVEN in the last 168 hours.  Recent Results (from the past 240 hour(s))  Respiratory Panel by RT PCR (Flu A&B, Covid) - Nasopharyngeal Swab     Status: None   Collection Time: 05/10/20  4:40 PM   Specimen: Nasopharyngeal Swab; Nasopharyngeal(NP) swabs in vial transport medium  Result Value Ref Range Status   SARS Coronavirus 2 by RT PCR NEGATIVE NEGATIVE Final    Comment: (NOTE) SARS-CoV-2 target nucleic acids are NOT DETECTED.  The SARS-CoV-2 RNA is generally detectable in upper respiratoy specimens during the acute phase of infection. The lowest concentration of SARS-CoV-2 viral copies this assay can detect is 131 copies/mL. A negative result does not preclude SARS-Cov-2 infection and should not be used as the sole basis for treatment or other patient management decisions. A negative result may occur with  improper specimen collection/handling, submission of specimen other than  nasopharyngeal swab, presence of viral mutation(s) within the areas targeted by this assay, and inadequate number of viral copies (<131 copies/mL). A negative result must be combined with clinical observations, patient history, and epidemiological information. The expected result is Negative.  Fact Sheet for Patients:  PinkCheek.be  Fact Sheet for Healthcare Providers:  GravelBags.it  This test is no t yet approved or cleared by the [place] FDA and  has been authorized for detection and/or diagnosis of SARS-CoV-2 by FDA under an Emergency Use Authorization (EUA). This EUA will remain  in effect (meaning this test can be used) for the duration of the COVID-19 declaration under Section 564(b)(1) of the Act, 21 U.S.C. section 360bbb-3(b)(1), unless the authorization is terminated or revoked sooner.     Influenza A by PCR NEGATIVE NEGATIVE Final   Influenza B by PCR NEGATIVE NEGATIVE Final    Comment: (NOTE) The Xpert Xpress SARS-CoV-2/FLU/RSV assay is intended as an aid in  the diagnosis of influenza from Nasopharyngeal swab specimens and  should not be used as a sole basis for treatment. Nasal washings and  aspirates are unacceptable for Xpert Xpress SARS-CoV-2/FLU/RSV  testing.  Fact Sheet for Patients: PinkCheek.be  Fact Sheet for Healthcare Providers: GravelBags.it  This test is not  yet approved or cleared by the [place] and  has been authorized for detection and/or diagnosis of SARS-CoV-2 by  FDA under an Emergency Use Authorization (EUA). This EUA will remain  in effect (meaning this test can be used) for the duration of the  Covid-19 declaration under Section 564(b)(1) of the Act, 21  U.S.C. section 360bbb-3(b)(1), unless the authorization is  terminated or revoked. Performed at [facility], [place] [address] [place],  [place] [address]          Radiology Studies: DG Chest 2 View  Addendum Date: 05/10/2020   ADDENDUM REPORT: 05/10/2020 14:32 ADDENDUM: Critical Value/emergent results were called by telephone at the time of interpretation on 05/10/2020 at 2:31 pm to provider CMA Rutherford Nail, who verbally acknowledged these results. She was informed of the patient's transfer to the emergency department. Electronically Signed   By: Richardean Sale M.D.   On: 05/10/2020 14:32   Result Date: 05/10/2020 CLINICAL DATA:  Left-sided chest pain with cough and shortness of breath for 2 weeks. No acute injury. EXAM: CHEST - 2 VIEW COMPARISON:  Chest radiographs 06/09/2014. FINDINGS: The heart size and mediastinal contours are stable without evidence of mediastinal shift. There is a new sizable left-sided pneumothorax estimated at approximately 40%. There is partial collapse of the left lung. The right lung is clear. There is no mediastinal shift or significant pleural effusion. No evidence of acute fracture. Mild thoracic spondylosis noted. IMPRESSION: New sizable left-sided pneumothorax estimated at approximately 40% with partial collapse of the left lung but no mediastinal shift. While attempting to reach the ordering provider with these results, this outpatient was taken to the emergency department at [facility]. Electronically Signed: By: Richardean Sale M.D. On: 05/10/2020 14:15   DG Wrist Complete Right  Result Date: 05/10/2020 CLINICAL DATA:  Ongoing wrist pain for years. EXAM: RIGHT WRIST - COMPLETE 3+ VIEW COMPARISON:  Radiographs 09/04/2017. 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 No evidence of developing avascular necrosis. There is a stable chronic fracture of the 5th metacarpal neck. Mild radiocarpal and intercarpal degenerative changes are stable. No evidence of acute fracture or dislocation. 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 Chronic fracture of the 5th metacarpal neck. No acute osseous findings. Electronically Signed   By: Richardean Sale M.D.   On: 05/10/2020 14:10   CT Chest Wo Contrast  Result Date: 05/10/2020 CLINICAL DATA:  Pneumothorax revealed on plain. EXAM: CT CHEST WITHOUT CONTRAST TECHNIQUE: Multidetector CT imaging of the chest was performed following the standard protocol without IV contrast. COMPARISON:  None. FINDINGS: Cardiovascular: No significant vascular findings. Normal heart size. No pericardial effusion. Mediastinum/Nodes: No enlarged mediastinal or axillary lymph nodes. Thyroid gland, trachea, and esophagus demonstrate no significant findings. Lungs/Pleura: Mild emphysematous lung disease and bullous changes are seen involving predominantly the bilateral upper lobes. Moderate severity scarring and/or atelectasis is seen within the left lower lobe and posterior aspect of the left upper lobe. A 5 mm noncalcified lung nodule is seen within the lateral aspect of the right upper lobe (axial CT image 49, CT series number 4). A 6 mm noncalcified lung nodule is noted within the anterior aspect of the right middle lobe (axial CT image 100, CT series number 4). A 6 mm noncalcified lung nodule is seen within the posterior aspect of the left upper lobe (axial CT image 75, CT series number  4). A large anterior left-sided pneumothorax is seen. This measures approximately 10.2 cm in maximum AP measurement and extends from the left apex of the left lung base. A small amount of pleural fluid is seen on the left. Upper Abdomen: There is a small hiatal hernia. Musculoskeletal: Degenerative changes seen throughout the thoracic spine. IMPRESSION: 1. Large anterior left-sided pneumothorax. 2. Moderate severity left lower lobe and posterior left upper lobe scarring and/or atelectasis with bilateral subcentimeter noncalcified lung nodules. Correlation with  three-month follow-up chest CT is recommended to determine stability, as an underlying neoplastic process cannot be excluded. 3. Small left pleural effusion. 4. Mild emphysematous lung disease and bullous changes are seen involving predominantly the bilateral upper lobes. 5. Small hiatal hernia. 6. Emphysema. Emphysema (ICD10-J43.9). Electronically Signed   By: Virgina Norfolk M.D.   On: 05/10/2020 19:39   DG CHEST PORT 1 VIEW  Result Date: 05/11/2020 CLINICAL DATA:  Evaluate left-sided pneumothorax. EXAM: PORTABLE CHEST 1 VIEW COMPARISON:  Chest x-ray May 10, 2020. Chest CT May 10, 2020. FINDINGS: The large left-sided pneumothorax is essentially stable. No right-sided pneumothorax. The right lung is clear. Mild opacity in the partially collapsed left lung was better appreciated on recent CT imaging, possibly scar atelectasis. Stable cardiomegaly. The hila and mediastinum are unchanged. No other acute abnormalities. IMPRESSION: 1. A large left-sided pneumothorax remains, not significantly changed. No shift of the heart or mediastinum to the right to suggest tension. 2. No other changes. Persistent opacity in the partially collapsed left lung. Electronically Signed   By: Dorise Bullion III M.D   On: 05/11/2020 09:52        Scheduled Meds: . amLODipine  10 mg Oral Daily  . B-complex with vitamin C  1 tablet Oral Daily  . gabapentin  100 mg Oral Q1200  . insulin aspart  0-9 Units Subcutaneous Q4H  . multivitamin with minerals  1 tablet Oral Daily  . pravastatin  10 mg Oral Daily   Continuous Infusions: . sodium chloride 50 mL/hr at 05/10/20 2340  . heparin 1,300 Units/hr (05/11/20 0836)          Aline August, MD [facility] 05/11/2020, 10:12 AM

## 2020-05-12 DIAGNOSIS — N182 Chronic kidney disease, stage 2 (mild): Secondary | ICD-10-CM

## 2020-05-12 DIAGNOSIS — J939 Pneumothorax, unspecified: Secondary | ICD-10-CM | POA: Diagnosis not present

## 2020-05-12 DIAGNOSIS — I10 Essential (primary) hypertension: Secondary | ICD-10-CM | POA: Diagnosis not present

## 2020-05-12 DIAGNOSIS — J9311 Primary spontaneous pneumothorax: Secondary | ICD-10-CM | POA: Diagnosis not present

## 2020-05-12 LAB — SURGICAL PCR SCREEN
MRSA, PCR: NEGATIVE
Staphylococcus aureus: NEGATIVE

## 2020-05-12 LAB — BASIC METABOLIC PANEL
Anion gap: 9 (ref 5–15)
BUN: 12 mg/dL (ref 8–23)
CO2: 24 mmol/L (ref 22–32)
Calcium: 8.7 mg/dL — ABNORMAL LOW (ref 8.9–10.3)
Chloride: 105 mmol/L (ref 98–111)
Creatinine, Ser: 1.04 mg/dL (ref 0.61–1.24)
GFR, Estimated: >60 mL/min (ref >60)
Glucose, Bld: 139 mg/dL — ABNORMAL HIGH (ref 70–99)
Potassium: 4 mmol/L (ref 3.5–5.1)
Sodium: 138 mmol/L (ref 135–145)

## 2020-05-12 LAB — HEPARIN LEVEL (UNFRACTIONATED): Heparin Unfractionated: 0.66 IU/mL (ref 0.30–0.70)

## 2020-05-12 LAB — CBC
HCT: 38.3 % — ABNORMAL LOW (ref 39.0–52.0)
Hemoglobin: 12.8 g/dL — ABNORMAL LOW (ref 13.0–17.0)
MCH: 30 pg (ref 26.0–34.0)
MCHC: 33.4 g/dL (ref 30.0–36.0)
MCV: 89.9 fL (ref 80.0–100.0)
Platelets: 237 10*3/uL (ref 150–400)
RBC: 4.26 MIL/uL (ref 4.22–5.81)
RDW: 13.2 % (ref 11.5–15.5)
WBC: 3.9 10*3/uL — ABNORMAL LOW (ref 4.0–10.5)
nRBC: 0 % (ref 0.0–0.2)

## 2020-05-12 LAB — APTT: aPTT: 96 seconds — ABNORMAL HIGH (ref 24–36)

## 2020-05-12 LAB — MAGNESIUM: Magnesium: 2.1 mg/dL (ref 1.7–2.4)

## 2020-05-12 NOTE — Progress Notes (Addendum)
      [address]       [place] [address]             [phone]        Procedure(s) (LRB): XI ROBOTIC ASSISTED THORASCOPY-WEDGE RESECTION apical pleurectomy mechanical pleurodesis (Left) Subjective: Resting in bed, says he feels OK, denies shortness of breath.   Objective: Vital signs in last 24 hours: Temp:  [97.3 F (36.3 C)-98 F (36.7 C)] 97.3 F (36.3 C) (11/21 0751) Pulse Rate:  [64-83] 64 (11/21 0435) Cardiac Rhythm: Normal sinus rhythm (11/21 0755) Resp:  [16-20] 19 (11/21 0751) BP: (123-151)/(59-91) 123/59 (11/21 0751) SpO2:  [93 %-99 %] 96 % (11/21 0751)     Intake/Output from previous day: 11/20 0701 - 11/21 0700 In: 2377.6 [P.O.:480; I.V.:1897.6] Out: -  Intake/Output this shift: Total I/O In: 120 [P.O.:120] Out: -   General appearance: alert, cooperative and no distress Neurologic: intact Heart: regular rate and rhythm Lungs: Respirations unlabored, O2 saturation satisfactory on room air  Lab Results: Recent Labs    05/11/20 0707 05/12/20 0228  WBC 4.3 3.9*  HGB 13.0 12.8*  HCT 39.0 38.3*  PLT 222 237   BMET:  Recent Labs    05/11/20 0707 05/12/20 0228  NA 138 138  K 3.9 4.0  CL 108 105  CO2 21* 24  GLUCOSE 123* 139*  BUN 14 12  CREATININE 1.17 1.04  CALCIUM 8.5* 8.7*    PT/INR:  Recent Labs    05/10/20 1614  LABPROT 12.2  INR 0.9   ABG    Component Value Date/Time   HCO3 24.3 (H) 03/31/2007 0909   TCO2 24 05/01/2014 1956   CBG (last 3)  Recent Labs    05/10/20 2105  GLUCAP 138*    Assessment/Plan: S/P Procedure(s) (LRB): XI ROBOTIC ASSISTED THORASCOPY-WEDGE RESECTION apical pleurectomy mechanical pleurodesis (Left)  -69 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  On Eliquis chronically for atrial fibrillation.  Heparin infusion while Eliquis while she is out.  Plan robotic assisted wedge resection of pleural blebs, apical pleurectomy, and mechanical pleurodesis in the OR  tomorrow.  Mr. Massenburg agrees to proceed with surgery.  He had no further questions regards to the procedure.   LOS: 2 days    Antony Odea, [place] [phone] 05/12/2020  Agree with above. OR tomorrow for robotic assisted left thoracoscopy, wedge resection, apical pleurectomy and mechanical pleurodesis.  Heidy Mccubbin Bary Leriche

## 2020-05-12 NOTE — Anesthesia Preprocedure Evaluation (Addendum)
Anesthesia Evaluation  Patient identified by MRN, date of birth, ID band Patient awake    Reviewed: Allergy & Precautions, NPO status , Patient's Chart, lab work & pertinent test results  History of Anesthesia Complications Negative for: history of anesthetic complications  Airway Mallampati: II  TM Distance: >3 FB Neck ROM: Full    Dental  (+) Dental Advisory Given, Partial Lower, Partial Upper   Pulmonary former smoker,   Spontaneous PTX    Pulmonary exam normal        Cardiovascular hypertension, Pt. on medications Normal cardiovascular exam+ dysrhythmias Atrial Fibrillation      Neuro/Psych CVA, Residual Symptoms negative psych ROS   GI/Hepatic Neg liver ROS, GERD  Medicated and Controlled,  Endo/Other  diabetes, Type 2  Renal/GU Renal InsufficiencyRenal disease     Musculoskeletal  (+) Arthritis ,   Abdominal   Peds  Hematology  On eliquis    Anesthesia Other Findings Covid test negative   Reproductive/Obstetrics                           Anesthesia Physical Anesthesia Plan  ASA: III  Anesthesia Plan: General   Post-op Pain Management:    Induction: Intravenous  PONV Risk Score and Plan: 3 and Treatment may vary due to age or medical condition, Ondansetron and Dexamethasone  Airway Management Planned: Double Lumen EBT  Additional Equipment: Arterial line  Intra-op Plan:   Post-operative Plan: Possible Post-op intubation/ventilation  Informed Consent: I have reviewed the patients History and Physical, chart, labs and discussed the procedure including the risks, benefits and alternatives for the proposed anesthesia with the patient or authorized representative who has indicated his/her understanding and acceptance.     Dental advisory given  Plan Discussed with: CRNA and Anesthesiologist  Anesthesia Plan Comments: (Good peripheral IV access, will not place CVL)        Anesthesia Quick Evaluation

## 2020-05-12 NOTE — Progress Notes (Signed)
Patient ambulated in hallway independently will monitor  [name], Bettina Gavia RN

## 2020-05-12 NOTE — Progress Notes (Signed)
[place] for Eliquis to Heparin Indication: atrial fibrillation  Allergies  Allergen Reactions  . Lisinopril Swelling    Throat swelling    Patient Measurements: Height: 6\' 1"  (185.4 cm) Weight: 82.9 kg (182 lb 11.2 oz) IBW/kg (Calculated) : 79.9  Heparin dosing wt: 82.9kg  Vital Signs: Temp: 97.3 F (36.3 C) (11/21 0751) Temp Source: Oral (11/21 0751) BP: 123/59 (11/21 0751) Pulse Rate: 64 (11/21 0435)  Labs: Recent Labs    05/10/20 1614 05/10/20 1614 05/11/20 0707 05/11/20 1430 05/12/20 0228  HGB 14.2   < > 13.0  --  12.8*  HCT 42.6  --  39.0  --  38.3*  PLT 251  --  222  --  237  APTT  --   --  50* 77* 96*  LABPROT 12.2  --   --   --   --   INR 0.9  --   --   --   --   HEPARINUNFRC  --   --  0.34  --  0.66  CREATININE 1.25*  --  1.17  --  1.04   < > = values in this interval not displayed.    Estimated Creatinine Clearance: 76.8 mL/min (by C-G formula based on SCr of 1.04 mg/dL).  Assessment: 69 year old male on Eliquis prior to admission for Afib. Pharmacy consulted for transition to heparin for pneumothorax surgery.  Last dose of Eliquis 11/19 at 11 am. 11/21 HL 0.66, aPTT 96, both therapeutic. Hgb 12-13, PLT WNL.   Goal of Therapy:  Heparin level 0.3-0.7 Monitor platelets by anticoagulation protocol: Yes   Plan:  -Continue heparin 1300 units/h -Daily heparin level and CBC, monitor for bleeding -F/u with Eliquis restart after procedures   Mercy Riding, PharmD PGY1 Acute Care Pharmacy Resident Please refer to [facility] for unit-specific pharmacist

## 2020-05-12 NOTE — Progress Notes (Signed)
Let patient know that x-ray of his right wrist shows arthritis changes and chronic fracture of the wrist and fifth knuckles.  Let me know if he changes his mind about wanting to see the orthopedic specialist.

## 2020-05-12 NOTE — Progress Notes (Signed)
Patient ID: [name], male   DOB: 1950/12/09, 69 y.o.   MRN: [id number]  PROGRESS NOTE    [name]  YBO:[id number] DOB: 01-16-1951 DOA: 05/10/2020 PCP: [name], MD   Brief Narrative:  69 y.o. male with medical history significant for paroxysmal A. fib on Eliquis, essential hypertension, prostate cancer, prior unspecified CVA with left-sided weakness who presented to [facility] ED at his PCPs request due to newly found pneumothorax on chest x-ray.  On presentation, CT chest showed large anterior left-sided pneumothorax.  Cardiothoracic surgery/Dr. [name] was consulted who recommended admission to [facility] service.   Assessment & Plan:   Spontaneous left sided anterior pneumothorax -Initially presented at his PCPs office with complaints of productive cough and chest congestion.  Around the same time had chest discomfort and shortness of breath.  Had a chest x-ray done outpatient which showed pneumothorax.  Was sent to the ED for further evaluation and management. -CT chest noncontrast revealed left-sided anterior pneumothorax. -CT surgery/Dr. [name] following: Possible surgical intervention on 05/13/2020. -Currently on room air and not tachypneic.  Paroxysmal A. Fib -Currently rate controlled.  Eliquis on hold.  Continue heparin drip.  Hypertension -Blood pressure intermittently on the higher side.  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  A1c 7.2.  Continue gabapentin  Hyperlipidemia -Continue home Pravachol  CKD stage II -Creatinine stable.  Monitor  Tobacco use disorder -Was counseled by admitting provider regarding tobacco cessation  Medical noncompliance -Apparently takes Eliquis only once a day. Patient stopped taking Pravachol >2 months ago because he ran out -He also stopped taking any diabetic medications months ago  DVT prophylaxis: Heparin drip Code Status: Full Family  Communication: None at bedside Disposition Plan: Status is: Inpatient  Remains inpatient appropriate because:Inpatient level of care appropriate due to severity of illness   Dispo:  Patient From: Home  Planned Disposition: Home  Expected discharge date: 3 days  Medically stable for discharge: No  Consultants: CT surgery  Procedures: None  Antimicrobials: None  Subjective: Patient seen and examined at bedside.  No overnight fever, vomiting, worsening shortness of breath or chest pain reported.  Objective: Vitals:   05/11/20 1710 05/11/20 2129 05/12/20 0033 05/12/20 0435  BP: (!) 151/84 (!) 150/91 (!) 147/83 (!) 143/84  Pulse: 64 70 76 64  Resp: 20 20 20 18   Temp: 98 F (36.7 C) 97.7 F (36.5 C) 97.8 F (36.6 C) 97.7 F (36.5 C)  TempSrc: Oral Oral Oral Oral  SpO2: 96% 98% 93% 99%  Weight:      Height:        Intake/Output Summary (Last 24 hours) at 05/12/2020 0733 Last data filed at 05/12/2020 0600 Gross per 24 hour  Intake 2377.59 ml  Output --  Net 2377.59 ml   Filed Weights   05/10/20 1452 05/10/20 2149  Weight: 86.6 kg 82.9 kg    Examination:  General exam: No distress.   Respiratory system: Decreased breath sounds at bases bilaterally with some scattered crackles Cardiovascular system: Rate controlled, S1-S2 heard Gastrointestinal system: Abdomen is nondistended, soft and nontender.  Bowel sounds are heard  extremities: No edema or clubbing   Data Reviewed: I have personally reviewed following labs and imaging studies  CBC: Recent Labs  Lab 05/10/20 1614 05/11/20 0707 05/12/20 0228  WBC 5.0 4.3 3.9*  NEUTROABS 3.1  --   --   HGB 14.2 13.0 12.8*  HCT 42.6 39.0 38.3*  MCV 90.6 90.7 89.9  PLT 251 222 220   Basic Metabolic Panel: Recent Labs  Lab 05/10/20 1614 05/11/20 0707 05/12/20 0228  NA 138 138 138  K 4.2 3.9 4.0  CL 105 108 105  CO2 23 21* 24  GLUCOSE 199* 123* 139*  BUN 14 14 12   CREATININE 1.25* 1.17 1.04  CALCIUM 9.1  8.5* 8.7*  MG  --   --  2.1   GFR: Estimated Creatinine Clearance: 76.8 mL/min (by C-G formula based on SCr of 1.04 mg/dL). Liver Function Tests: Recent Labs  Lab 05/10/20 1614  AST 19  ALT 13  ALKPHOS 72  BILITOT 0.8  PROT 7.0  ALBUMIN 3.6   No results for input(s): LIPASE, AMYLASE in the last 168 hours. No results for input(s): AMMONIA in the last 168 hours. Coagulation Profile: Recent Labs  Lab 05/10/20 1614  INR 0.9   Cardiac Enzymes: No results for input(s): CKTOTAL, CKMB, CKMBINDEX, TROPONINI in the last 168 hours. BNP (last 3 results) No results for input(s): PROBNP in the last 8760 hours. HbA1C: Recent Labs    05/10/20 1614  HGBA1C 7.2*   CBG: Recent Labs  Lab 05/10/20 2105  GLUCAP 138*   Lipid Profile: No results for input(s): CHOL, HDL, LDLCALC, TRIG, CHOLHDL, LDLDIRECT in the last 72 hours. Thyroid Function Tests: No results for input(s): TSH, T4TOTAL, FREET4, T3FREE, THYROIDAB in the last 72 hours. Anemia Panel: No results for input(s): VITAMINB12, FOLATE, FERRITIN, TIBC, IRON, RETICCTPCT in the last 72 hours. Sepsis Labs: No results for input(s): PROCALCITON, LATICACIDVEN in the last 168 hours.  Recent Results (from the past 240 hour(s))  Respiratory Panel by RT PCR (Flu A&B, Covid) - Nasopharyngeal Swab     Status: None   Collection Time: 05/10/20  4:40 PM   Specimen: Nasopharyngeal Swab; Nasopharyngeal(NP) swabs in vial transport medium  Result Value Ref Range Status   SARS Coronavirus 2 by RT PCR NEGATIVE NEGATIVE Final    Comment: (NOTE) SARS-CoV-2 target nucleic acids are NOT DETECTED.  The SARS-CoV-2 RNA is generally detectable in upper respiratoy specimens during the acute phase of infection. The lowest concentration of SARS-CoV-2 viral copies this assay can detect is 131 copies/mL. A negative result does not preclude SARS-Cov-2 infection and should not be used as the sole basis for treatment or other patient management decisions. A  negative result may occur with  improper specimen collection/handling, submission of specimen other than nasopharyngeal swab, presence of viral mutation(s) within the areas targeted by this assay, and inadequate number of viral copies (<131 copies/mL). A negative result must be combined with clinical observations, patient history, and epidemiological information. The expected result is Negative.  Fact Sheet for Patients:  PinkCheek.be  Fact Sheet for Healthcare Providers:  GravelBags.it  This test is no t yet approved or cleared by the [place] FDA and  has been authorized for detection and/or diagnosis of SARS-CoV-2 by FDA under an Emergency Use Authorization (EUA). This EUA will remain  in effect (meaning this test can be used) for the duration of the COVID-19 declaration under Section 564(b)(1) of the Act, 21 U.S.C. section 360bbb-3(b)(1), unless the authorization is terminated or revoked sooner.     Influenza A by PCR NEGATIVE NEGATIVE Final   Influenza B by PCR NEGATIVE NEGATIVE Final    Comment: (NOTE) The Xpert Xpress SARS-CoV-2/FLU/RSV assay is intended as an aid in  the diagnosis of influenza from Nasopharyngeal swab specimens and  should not be used as a sole basis for treatment. Nasal washings and  aspirates are unacceptable for Xpert Xpress SARS-CoV-2/FLU/RSV  testing.  Fact Sheet for Patients: PinkCheek.be  Fact Sheet for Healthcare Providers: GravelBags.it  This test is not yet approved or cleared by the [place] FDA and  has been authorized for detection and/or diagnosis of SARS-CoV-2 by  FDA under an Emergency Use Authorization (EUA). This EUA will remain  in effect (meaning this test can be used) for the duration of the  Covid-19 declaration under Section 564(b)(1) of the Act, 21  U.S.C. section 360bbb-3(b)(1), unless the authorization  is  terminated or revoked. Performed at [facility], [address], [place], [place] [address]          Radiology Studies: DG Chest 2 View  Addendum Date: 05/10/2020   ADDENDUM REPORT: 05/10/2020 14:32 ADDENDUM: Critical Value/emergent results were called by telephone at the time of interpretation on 05/10/2020 at 2:31 pm to provider CMA Rutherford Nail, who verbally acknowledged these results. She was informed of the patient's transfer to the emergency department. Electronically Signed   By: Richardean Sale M.D.   On: 05/10/2020 14:32   Result Date: 05/10/2020 CLINICAL DATA:  Left-sided chest pain with cough and shortness of breath for 2 weeks. No acute injury. EXAM: CHEST - 2 VIEW COMPARISON:  Chest radiographs 06/09/2014. FINDINGS: The heart size and mediastinal contours are stable without evidence of mediastinal shift. There is a new sizable left-sided pneumothorax estimated at approximately 40%. There is partial collapse of the left lung. The right lung is clear. There is no mediastinal shift or significant pleural effusion. No evidence of acute fracture. Mild thoracic spondylosis noted. IMPRESSION: New sizable left-sided pneumothorax estimated at approximately 40% with partial collapse of the left lung but no mediastinal shift. While attempting to reach the ordering provider with these results, this outpatient was taken to the emergency department at [facility]. Electronically Signed: By: Richardean Sale M.D. On: 05/10/2020 14:15   DG Wrist Complete Right  Result Date: 05/10/2020 CLINICAL DATA:  Ongoing wrist pain for years. EXAM: RIGHT WRIST - COMPLETE 3+ VIEW COMPARISON:  Radiographs 09/04/2017. 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 No evidence of developing avascular necrosis. There is a stable chronic fracture of the 5th metacarpal neck. Mild radiocarpal and intercarpal degenerative changes  are stable. No evidence of acute fracture or dislocation. 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 Chronic fracture of the 5th metacarpal neck. No acute osseous findings. Electronically Signed   By: Richardean Sale M.D.   On: 05/10/2020 14:10   CT Chest Wo Contrast  Result Date: 05/10/2020 CLINICAL DATA:  Pneumothorax revealed on plain. EXAM: CT CHEST WITHOUT CONTRAST TECHNIQUE: Multidetector CT imaging of the chest was performed following the standard protocol without IV contrast. COMPARISON:  None. FINDINGS: Cardiovascular: No significant vascular findings. Normal heart size. No pericardial effusion. Mediastinum/Nodes: No enlarged mediastinal or axillary lymph nodes. Thyroid gland, trachea, and esophagus demonstrate no significant findings. Lungs/Pleura: Mild emphysematous lung disease and bullous changes are seen involving predominantly the bilateral upper lobes. Moderate severity scarring and/or atelectasis is seen within the left lower lobe and posterior aspect of the left upper lobe. A 5 mm noncalcified lung nodule is seen within the lateral aspect of the right upper lobe (axial CT image 49, CT series number 4). A 6 mm noncalcified lung nodule is noted within the anterior aspect of the right middle lobe (axial CT image 100, CT  series number 4). A 6 mm noncalcified lung nodule is seen within the posterior aspect of the left upper lobe (axial CT image 75, CT series number 4). A large anterior left-sided pneumothorax is seen. This measures approximately 10.2 cm in maximum AP measurement and extends from the left apex of the left lung base. A small amount of pleural fluid is seen on the left. Upper Abdomen: There is a small hiatal hernia. Musculoskeletal: Degenerative changes seen throughout the thoracic spine. IMPRESSION: 1. Large anterior left-sided pneumothorax. 2. Moderate severity left lower lobe and posterior left upper lobe scarring  and/or atelectasis with bilateral subcentimeter noncalcified lung nodules. Correlation with three-month follow-up chest CT is recommended to determine stability, as an underlying neoplastic process cannot be excluded. 3. Small left pleural effusion. 4. Mild emphysematous lung disease and bullous changes are seen involving predominantly the bilateral upper lobes. 5. Small hiatal hernia. 6. Emphysema. Emphysema (ICD10-J43.9). Electronically Signed   By: Virgina Norfolk M.D.   On: 05/10/2020 19:39   DG CHEST PORT 1 VIEW  Result Date: 05/11/2020 CLINICAL DATA:  Evaluate left-sided pneumothorax. EXAM: PORTABLE CHEST 1 VIEW COMPARISON:  Chest x-ray May 10, 2020. Chest CT May 10, 2020. FINDINGS: The large left-sided pneumothorax is essentially stable. No right-sided pneumothorax. The right lung is clear. Mild opacity in the partially collapsed left lung was better appreciated on recent CT imaging, possibly scar atelectasis. Stable cardiomegaly. The hila and mediastinum are unchanged. No other acute abnormalities. IMPRESSION: 1. A large left-sided pneumothorax remains, not significantly changed. No shift of the heart or mediastinum to the right to suggest tension. 2. No other changes. Persistent opacity in the partially collapsed left lung. Electronically Signed   By: Dorise Bullion III M.D   On: 05/11/2020 09:52        Scheduled Meds:  amLODipine  10 mg Oral Daily   B-complex with vitamin C  1 tablet Oral Daily   gabapentin  100 mg Oral Q1200   insulin aspart  0-5 Units Subcutaneous QHS   insulin aspart  0-9 Units Subcutaneous TID WC   multivitamin with minerals  1 tablet Oral Daily   pravastatin  10 mg Oral Daily   Continuous Infusions:  sodium chloride 50 mL/hr at 05/10/20 2340   heparin 1,300 Units/hr (05/11/20 0836)          Aline August, MD [facility] 05/12/2020, 7:33 AM

## 2020-05-13 ENCOUNTER — Encounter (HOSPITAL_COMMUNITY)
Admission: EM | Disposition: A | Discharge status: Home / Self Care | Payer: Self-pay | Source: Home / Self Care | Attending: Internal Medicine

## 2020-05-13 ENCOUNTER — Encounter (HOSPITAL_COMMUNITY): Payer: Self-pay | Admitting: Internal Medicine

## 2020-05-13 ENCOUNTER — Inpatient Hospital Stay (HOSPITAL_COMMUNITY): Payer: Medicare HMO

## 2020-05-13 ENCOUNTER — Inpatient Hospital Stay (HOSPITAL_COMMUNITY): Payer: Medicare HMO | Admitting: Anesthesiology

## 2020-05-13 DIAGNOSIS — I480 Paroxysmal atrial fibrillation: Secondary | ICD-10-CM | POA: Diagnosis not present

## 2020-05-13 DIAGNOSIS — Z9119 Patient's noncompliance with other medical treatment and regimen: Secondary | ICD-10-CM

## 2020-05-13 DIAGNOSIS — J9311 Primary spontaneous pneumothorax: Secondary | ICD-10-CM | POA: Diagnosis not present

## 2020-05-13 DIAGNOSIS — I10 Essential (primary) hypertension: Secondary | ICD-10-CM | POA: Diagnosis not present

## 2020-05-13 DIAGNOSIS — J939 Pneumothorax, unspecified: Secondary | ICD-10-CM

## 2020-05-13 HISTORY — PX: WEDGE RESECTION: SHX5070

## 2020-05-13 HISTORY — PX: INTERCOSTAL NERVE BLOCK: SHX5021

## 2020-05-13 LAB — TYPE AND SCREEN
ABO/RH(D): B POS
Antibody Screen: NEGATIVE

## 2020-05-13 LAB — BASIC METABOLIC PANEL
Anion gap: 8 (ref 5–15)
BUN: 15 mg/dL (ref 8–23)
CO2: 23 mmol/L (ref 22–32)
Calcium: 8.8 mg/dL — ABNORMAL LOW (ref 8.9–10.3)
Chloride: 107 mmol/L (ref 98–111)
Creatinine, Ser: 1.31 mg/dL — ABNORMAL HIGH (ref 0.61–1.24)
GFR, Estimated: 59 mL/min — ABNORMAL LOW (ref >60)
Glucose, Bld: 137 mg/dL — ABNORMAL HIGH (ref 70–99)
Potassium: 3.9 mmol/L (ref 3.5–5.1)
Sodium: 138 mmol/L (ref 135–145)

## 2020-05-13 LAB — CBC
HCT: 39.2 % (ref 39.0–52.0)
Hemoglobin: 13.4 g/dL (ref 13.0–17.0)
MCH: 30.5 pg (ref 26.0–34.0)
MCHC: 34.2 g/dL (ref 30.0–36.0)
MCV: 89.3 fL (ref 80.0–100.0)
Platelets: 232 10*3/uL (ref 150–400)
RBC: 4.39 MIL/uL (ref 4.22–5.81)
RDW: 13.2 % (ref 11.5–15.5)
WBC: 4.4 10*3/uL (ref 4.0–10.5)
nRBC: 0 % (ref 0.0–0.2)

## 2020-05-13 LAB — GLUCOSE, CAPILLARY
Glucose-Capillary: 159 mg/dL — ABNORMAL HIGH (ref 70–99)
Glucose-Capillary: 128 mg/dL — ABNORMAL HIGH (ref 70–99)
Glucose-Capillary: 130 mg/dL — ABNORMAL HIGH (ref 70–99)
Glucose-Capillary: 132 mg/dL — ABNORMAL HIGH (ref 70–99)
Glucose-Capillary: 169 mg/dL — ABNORMAL HIGH (ref 70–99)
Glucose-Capillary: 129 mg/dL — ABNORMAL HIGH (ref 70–99)
Glucose-Capillary: 161 mg/dL — ABNORMAL HIGH (ref 70–99)
Glucose-Capillary: 95 mg/dL (ref 70–99)
Glucose-Capillary: 211 mg/dL — ABNORMAL HIGH (ref 70–99)
Glucose-Capillary: 220 mg/dL — ABNORMAL HIGH (ref 70–99)
Glucose-Capillary: 123 mg/dL — ABNORMAL HIGH (ref 70–99)
Glucose-Capillary: 150 mg/dL — ABNORMAL HIGH (ref 70–99)
Glucose-Capillary: 168 mg/dL — ABNORMAL HIGH (ref 70–99)
Glucose-Capillary: 189 mg/dL — ABNORMAL HIGH (ref 70–99)

## 2020-05-13 LAB — HEPARIN LEVEL (UNFRACTIONATED): Heparin Unfractionated: 0.62 IU/mL (ref 0.30–0.70)

## 2020-05-13 LAB — ABO/RH: ABO/RH(D): B POS

## 2020-05-13 SURGERY — WEDGE RESECTION, LUNG, ROBOT-ASSISTED, THORACOSCOPIC
Anesthesia: General | Site: Chest | Laterality: Left

## 2020-05-13 MED ORDER — 0.9 % SODIUM CHLORIDE (POUR BTL) OPTIME
TOPICAL | Status: DC | PRN
  Administered 2020-05-13 (×2): 1000 mL

## 2020-05-13 MED ORDER — FENTANYL CITRATE (PF) 100 MCG/2ML IJ SOLN
INTRAMUSCULAR | Status: AC
  Filled 2020-05-13: qty 2

## 2020-05-13 MED ORDER — KETOROLAC TROMETHAMINE 15 MG/ML IJ SOLN
INTRAMUSCULAR | Status: AC
  Filled 2020-05-13: qty 1

## 2020-05-13 MED ORDER — DEXAMETHASONE SODIUM PHOSPHATE 10 MG/ML IJ SOLN
INTRAMUSCULAR | Status: AC
  Filled 2020-05-13: qty 1

## 2020-05-13 MED ORDER — CEFAZOLIN SODIUM-DEXTROSE 2-3 GM-%(50ML) IV SOLR
INTRAVENOUS | Status: DC | PRN
  Administered 2020-05-13: 2 g via INTRAVENOUS

## 2020-05-13 MED ORDER — ONDANSETRON HCL 4 MG/2ML IJ SOLN
INTRAMUSCULAR | Status: DC | PRN
  Administered 2020-05-13: 4 mg via INTRAVENOUS

## 2020-05-13 MED ORDER — ACETAMINOPHEN 500 MG PO TABS
1000.0000 mg | ORAL_TABLET | Freq: Four times a day (QID) | ORAL | Status: DC
  Administered 2020-05-13 – 2020-05-15 (×2): 1000 mg via ORAL
  Filled 2020-05-13 (×5): qty 2

## 2020-05-13 MED ORDER — ROCURONIUM BROMIDE 10 MG/ML (PF) SYRINGE
PREFILLED_SYRINGE | INTRAVENOUS | Status: DC | PRN
  Administered 2020-05-13: 80 mg via INTRAVENOUS

## 2020-05-13 MED ORDER — PHENYLEPHRINE HCL-NACL 10-0.9 MG/250ML-% IV SOLN
INTRAVENOUS | Status: DC | PRN
  Administered 2020-05-13: 25 ug/min via INTRAVENOUS

## 2020-05-13 MED ORDER — PROPOFOL 10 MG/ML IV BOLUS
INTRAVENOUS | Status: DC | PRN
  Administered 2020-05-13: 160 mg via INTRAVENOUS
  Administered 2020-05-13: 20 mg via INTRAVENOUS

## 2020-05-13 MED ORDER — CHLORHEXIDINE GLUCONATE 0.12 % MT SOLN
OROMUCOSAL | Status: AC
  Administered 2020-05-13: 15 mL via OROMUCOSAL
  Filled 2020-05-13: qty 15

## 2020-05-13 MED ORDER — APIXABAN 5 MG PO TABS
5.0000 mg | ORAL_TABLET | Freq: Two times a day (BID) | ORAL | Status: DC
  Administered 2020-05-14 – 2020-05-16 (×5): 5 mg via ORAL
  Filled 2020-05-13 (×5): qty 1

## 2020-05-13 MED ORDER — LACTATED RINGERS IV SOLN: INTRAVENOUS | Status: DC

## 2020-05-13 MED ORDER — FENTANYL CITRATE (PF) 250 MCG/5ML IJ SOLN
INTRAMUSCULAR | Status: AC
  Filled 2020-05-13: qty 5

## 2020-05-13 MED ORDER — CEFAZOLIN SODIUM-DEXTROSE 2-4 GM/100ML-% IV SOLN
INTRAVENOUS | Status: AC
  Filled 2020-05-13: qty 100

## 2020-05-13 MED ORDER — LACTATED RINGERS IV SOLN: INTRAVENOUS | Status: DC | PRN

## 2020-05-13 MED ORDER — TRAMADOL HCL 50 MG PO TABS
50.0000 mg | ORAL_TABLET | Freq: Four times a day (QID) | ORAL | Status: DC | PRN
  Administered 2020-05-13 – 2020-05-14 (×2): 100 mg via ORAL
  Filled 2020-05-13 (×2): qty 2
  Filled 2020-05-13: qty 1

## 2020-05-13 MED ORDER — SODIUM CHLORIDE FLUSH 0.9 % IV SOLN
INTRAVENOUS | Status: DC | PRN
  Administered 2020-05-13: 100 mL

## 2020-05-13 MED ORDER — MIDAZOLAM HCL 2 MG/2ML IJ SOLN
INTRAMUSCULAR | Status: AC
  Filled 2020-05-13: qty 2

## 2020-05-13 MED ORDER — PHENYLEPHRINE 40 MCG/ML (10ML) SYRINGE FOR IV PUSH (FOR BLOOD PRESSURE SUPPORT)
PREFILLED_SYRINGE | INTRAVENOUS | Status: DC | PRN
  Administered 2020-05-13 (×2): 80 ug via INTRAVENOUS
  Administered 2020-05-13: 40 ug via INTRAVENOUS
  Administered 2020-05-13: 80 ug via INTRAVENOUS
  Administered 2020-05-13 (×2): 120 ug via INTRAVENOUS

## 2020-05-13 MED ORDER — VASOPRESSIN 20 UNIT/ML IV SOLN
INTRAVENOUS | Status: AC
  Filled 2020-05-13: qty 1

## 2020-05-13 MED ORDER — CHLORHEXIDINE GLUCONATE 0.12 % MT SOLN: 15.0000 mL | Freq: Once | OROMUCOSAL | Status: AC

## 2020-05-13 MED ORDER — ALBUMIN HUMAN 5 % IV SOLN: INTRAVENOUS | Status: DC | PRN

## 2020-05-13 MED ORDER — BISACODYL 5 MG PO TBEC
10.0000 mg | DELAYED_RELEASE_TABLET | Freq: Every day | ORAL | Status: DC
  Administered 2020-05-13 – 2020-05-15 (×3): 10 mg via ORAL
  Filled 2020-05-13 (×3): qty 2

## 2020-05-13 MED ORDER — FENTANYL CITRATE (PF) 100 MCG/2ML IJ SOLN: 25.0000 ug | INTRAMUSCULAR | Status: DC | PRN

## 2020-05-13 MED ORDER — BUPIVACAINE LIPOSOME 1.3 % IJ SUSP
20.0000 mL | Freq: Once | INTRAMUSCULAR | Status: DC
  Filled 2020-05-13: qty 20

## 2020-05-13 MED ORDER — FENTANYL CITRATE (PF) 100 MCG/2ML IJ SOLN
INTRAMUSCULAR | Status: AC
  Administered 2020-05-13: 25 ug via INTRAVENOUS
  Filled 2020-05-13: qty 2

## 2020-05-13 MED ORDER — SENNOSIDES-DOCUSATE SODIUM 8.6-50 MG PO TABS
1.0000 | ORAL_TABLET | Freq: Every day | ORAL | Status: DC
  Administered 2020-05-13: 1 via ORAL
  Filled 2020-05-13 (×2): qty 1

## 2020-05-13 MED ORDER — ONDANSETRON HCL 4 MG/2ML IJ SOLN: 4.0000 mg | Freq: Once | INTRAMUSCULAR | Status: DC | PRN

## 2020-05-13 MED ORDER — BUPIVACAINE HCL (PF) 0.5 % IJ SOLN
INTRAMUSCULAR | Status: AC
  Filled 2020-05-13: qty 30

## 2020-05-13 MED ORDER — PROPOFOL 10 MG/ML IV BOLUS
INTRAVENOUS | Status: AC
  Filled 2020-05-13: qty 40

## 2020-05-13 MED ORDER — FENTANYL CITRATE (PF) 250 MCG/5ML IJ SOLN
INTRAMUSCULAR | Status: DC | PRN
  Administered 2020-05-13: 150 ug via INTRAVENOUS
  Administered 2020-05-13: 50 ug via INTRAVENOUS

## 2020-05-13 MED ORDER — DEXAMETHASONE SODIUM PHOSPHATE 10 MG/ML IJ SOLN
INTRAMUSCULAR | Status: DC | PRN
  Administered 2020-05-13: 4 mg via INTRAVENOUS

## 2020-05-13 MED ORDER — ACETAMINOPHEN 160 MG/5ML PO SOLN: 1000.0000 mg | Freq: Four times a day (QID) | ORAL | Status: DC

## 2020-05-13 MED ORDER — OXYCODONE HCL 5 MG PO TABS: 5.0000 mg | ORAL_TABLET | Freq: Once | ORAL | Status: DC | PRN

## 2020-05-13 MED ORDER — LIDOCAINE 2% (20 MG/ML) 5 ML SYRINGE
INTRAMUSCULAR | Status: DC | PRN
  Administered 2020-05-13: 60 mg via INTRAVENOUS

## 2020-05-13 MED ORDER — HEMOSTATIC AGENTS (NO CHARGE) OPTIME
TOPICAL | Status: DC | PRN
  Administered 2020-05-13: 1 via TOPICAL

## 2020-05-13 MED ORDER — KETOROLAC TROMETHAMINE 30 MG/ML IJ SOLN
15.0000 mg | Freq: Four times a day (QID) | INTRAMUSCULAR | Status: DC
  Administered 2020-05-13 (×2): 15 mg via INTRAVENOUS
  Filled 2020-05-13 (×4): qty 1

## 2020-05-13 MED ORDER — ONDANSETRON HCL 4 MG/2ML IJ SOLN
INTRAMUSCULAR | Status: AC
  Filled 2020-05-13: qty 2

## 2020-05-13 MED ORDER — SUGAMMADEX SODIUM 200 MG/2ML IV SOLN
INTRAVENOUS | Status: DC | PRN
  Administered 2020-05-13: 200 mg via INTRAVENOUS

## 2020-05-13 MED ORDER — OXYCODONE HCL 5 MG/5ML PO SOLN: 5.0000 mg | Freq: Once | ORAL | Status: DC | PRN

## 2020-05-13 MED ORDER — PHENYLEPHRINE 40 MCG/ML (10ML) SYRINGE FOR IV PUSH (FOR BLOOD PRESSURE SUPPORT)
PREFILLED_SYRINGE | INTRAVENOUS | Status: AC
  Filled 2020-05-13: qty 10

## 2020-05-13 SURGICAL SUPPLY — 114 items
BLADE CLIPPER SURG (BLADE) IMPLANT
BLADE SURG 11 STRL SS (BLADE) ×3 IMPLANT
BNDG COHESIVE 6X5 TAN STRL LF (GAUZE/BANDAGES/DRESSINGS) IMPLANT
CANISTER SUCT 3000ML PPV (MISCELLANEOUS) ×6 IMPLANT
CANNULA REDUC XI 12-8 STAPL (CANNULA) ×2
CANNULA REDUC XI 12-8MM STAPL (CANNULA) ×1
CANNULA REDUCER 12-8 DVNC XI (CANNULA) ×1 IMPLANT
CATH THORACIC 28FR (CATHETERS) ×3 IMPLANT
CATH THORACIC 28FR RT ANG (CATHETERS) IMPLANT
CATH THORACIC 36FR (CATHETERS) IMPLANT
CATH THORACIC 36FR RT ANG (CATHETERS) IMPLANT
CATH TROCAR 20FR (CATHETERS) IMPLANT
CHLORAPREP W/TINT 26 (MISCELLANEOUS) ×3 IMPLANT
CLEANER TIP ELECTROSURG 2X2 (MISCELLANEOUS) ×3 IMPLANT
CLIP VESOCCLUDE MED 6/CT (CLIP) IMPLANT
CNTNR URN SCR LID CUP LEK RST (MISCELLANEOUS) ×4 IMPLANT
CONN ST 1/4X3/8  BEN (MISCELLANEOUS)
CONN ST 1/4X3/8 BEN (MISCELLANEOUS) IMPLANT
CONT SPEC 4OZ STRL OR WHT (MISCELLANEOUS) ×12
COVER SURGICAL LIGHT HANDLE (MISCELLANEOUS) IMPLANT
DEFOGGER SCOPE WARMER CLEARIFY (MISCELLANEOUS) ×3 IMPLANT
DERMABOND ADVANCED (GAUZE/BANDAGES/DRESSINGS) ×2
DERMABOND ADVANCED .7 DNX12 (GAUZE/BANDAGES/DRESSINGS) ×1 IMPLANT
DRAIN CHANNEL 28F RND 3/8 FF (WOUND CARE) IMPLANT
DRAIN CHANNEL 32F RND 10.7 FF (WOUND CARE) IMPLANT
DRAPE ARM DVNC X/XI (DISPOSABLE) ×4 IMPLANT
DRAPE COLUMN DVNC XI (DISPOSABLE) ×1 IMPLANT
DRAPE CV SPLIT W-CLR ANES SCRN (DRAPES) ×3 IMPLANT
DRAPE DA VINCI XI ARM (DISPOSABLE) ×12
DRAPE DA VINCI XI COLUMN (DISPOSABLE) ×3
DRAPE HALF SHEET 40X57 (DRAPES) ×3 IMPLANT
DRAPE ORTHO SPLIT 77X108 STRL (DRAPES) ×3
DRAPE SURG ORHT 6 SPLT 77X108 (DRAPES) ×1 IMPLANT
DRAPE WARM FLUID 44X44 (DRAPES) IMPLANT
ELECT BLADE 6.5 EXT (BLADE) ×3 IMPLANT
ELECT REM PT RETURN 9FT ADLT (ELECTROSURGICAL) ×3
ELECTRODE REM PT RTRN 9FT ADLT (ELECTROSURGICAL) ×1 IMPLANT
GAUZE KITTNER 4X5 RF (MISCELLANEOUS) ×3 IMPLANT
GAUZE SPONGE 4X4 12PLY STRL (GAUZE/BANDAGES/DRESSINGS) IMPLANT
GLOVE BIO SURGEON STRL SZ 6.5 (GLOVE) ×2 IMPLANT
GLOVE BIO SURGEON STRL SZ7.5 (GLOVE) ×12 IMPLANT
GLOVE BIO SURGEONS STRL SZ 6.5 (GLOVE) ×1
GLOVE BIOGEL PI IND STRL 6.5 (GLOVE) ×1 IMPLANT
GLOVE BIOGEL PI INDICATOR 6.5 (GLOVE) ×2
GLOVE SURG SS PI 7.5 STRL IVOR (GLOVE) ×3 IMPLANT
GLOVE SURG SS PI 8.0 STRL IVOR (GLOVE) ×3 IMPLANT
GOWN STRL REUS W/ TWL LRG LVL3 (GOWN DISPOSABLE) ×2 IMPLANT
GOWN STRL REUS W/ TWL XL LVL3 (GOWN DISPOSABLE) ×3 IMPLANT
GOWN STRL REUS W/TWL 2XL LVL3 (GOWN DISPOSABLE) ×3 IMPLANT
GOWN STRL REUS W/TWL LRG LVL3 (GOWN DISPOSABLE) ×6
GOWN STRL REUS W/TWL XL LVL3 (GOWN DISPOSABLE) ×9
HEMOSTAT SURGICEL 2X14 (HEMOSTASIS) ×3 IMPLANT
KIT BASIN OR (CUSTOM PROCEDURE TRAY) ×3 IMPLANT
KIT SUCTION CATH 14FR (SUCTIONS) IMPLANT
KIT TURNOVER KIT B (KITS) ×3 IMPLANT
LOOP VESSEL SUPERMAXI WHITE (MISCELLANEOUS) IMPLANT
NEEDLE 22X1 1/2 (OR ONLY) (NEEDLE) ×3 IMPLANT
NEEDLE HYPO 25GX1X1/2 BEV (NEEDLE) IMPLANT
NS IRRIG 1000ML POUR BTL (IV SOLUTION) ×6 IMPLANT
OBTURATOR OPTICAL STANDARD 8MM (TROCAR)
OBTURATOR OPTICAL STND 8 DVNC (TROCAR)
OBTURATOR OPTICALSTD 8 DVNC (TROCAR) IMPLANT
PACK CHEST (CUSTOM PROCEDURE TRAY) ×3 IMPLANT
PAD ARMBOARD 7.5X6 YLW CONV (MISCELLANEOUS) ×15 IMPLANT
PORT ACCESS TROCAR AIRSEAL 12 (TROCAR) IMPLANT
PORT ACCESS TROCAR AIRSEAL 5M (TROCAR)
RELOAD STAPLER 3.5X45 BLU DVNC (STAPLE) ×6 IMPLANT
SEAL CANN UNIV 5-8 DVNC XI (MISCELLANEOUS) ×2 IMPLANT
SEAL XI 5MM-8MM UNIVERSAL (MISCELLANEOUS) ×6
SEALANT PROGEL (MISCELLANEOUS) IMPLANT
SEALANT SURG COSEAL 4ML (VASCULAR PRODUCTS) IMPLANT
SEALANT SURG COSEAL 8ML (VASCULAR PRODUCTS) IMPLANT
SET TRI-LUMEN FLTR TB AIRSEAL (TUBING) IMPLANT
SET TUBE SMOKE EVAC HIGH FLOW (TUBING) ×3 IMPLANT
SOLUTION ELECTROLUBE (MISCELLANEOUS) ×3 IMPLANT
SPONGE INTESTINAL PEANUT (DISPOSABLE) ×3 IMPLANT
STAPLER 45 DA VINCI SURE FORM (STAPLE) ×3
STAPLER 45 SUREFORM DVNC (STAPLE) ×1 IMPLANT
STAPLER CANNULA SEAL DVNC XI (STAPLE) ×1 IMPLANT
STAPLER CANNULA SEAL XI (STAPLE) ×3
STAPLER RELOAD 3.5X45 BLU DVNC (STAPLE) ×6
STAPLER RELOAD 3.5X45 BLUE (STAPLE) ×18
STOPCOCK 4 WAY LG BORE MALE ST (IV SETS) ×3 IMPLANT
SUT MON AB 2-0 CT1 36 (SUTURE) IMPLANT
SUT PDS AB 1 CTX 36 (SUTURE) IMPLANT
SUT PROLENE 4 0 RB 1 (SUTURE)
SUT PROLENE 4-0 RB1 .5 CRCL 36 (SUTURE) IMPLANT
SUT SILK  1 MH (SUTURE) ×3
SUT SILK 1 MH (SUTURE) ×1 IMPLANT
SUT SILK 1 TIES 10X30 (SUTURE) IMPLANT
SUT SILK 2 0 SH (SUTURE) IMPLANT
SUT SILK 2 0SH CR/8 30 (SUTURE) ×3 IMPLANT
SUT VIC AB 1 CTX 36 (SUTURE)
SUT VIC AB 1 CTX36XBRD ANBCTR (SUTURE) IMPLANT
SUT VIC AB 2-0 CT1 27 (SUTURE) ×3
SUT VIC AB 2-0 CT1 TAPERPNT 27 (SUTURE) ×1 IMPLANT
SUT VIC AB 3-0 SH 27 (SUTURE) ×6
SUT VIC AB 3-0 SH 27X BRD (SUTURE) ×2 IMPLANT
SUT VICRYL 0 TIES 12 18 (SUTURE) ×3 IMPLANT
SUT VICRYL 0 UR6 27IN ABS (SUTURE) ×6 IMPLANT
SUT VICRYL 2 TP 1 (SUTURE) IMPLANT
SYR 10ML LL (SYRINGE) ×3 IMPLANT
SYR 20ML LL LF (SYRINGE) ×3 IMPLANT
SYR 50ML LL SCALE MARK (SYRINGE) ×3 IMPLANT
SYR BULB IRRIG 60ML STRL (SYRINGE) ×3 IMPLANT
SYSTEM SAHARA CHEST DRAIN ATS (WOUND CARE) ×3 IMPLANT
TAPE CLOTH 4X10 WHT NS (GAUZE/BANDAGES/DRESSINGS) IMPLANT
TAPE CLOTH SURG 4X10 WHT LF (GAUZE/BANDAGES/DRESSINGS) ×3 IMPLANT
TIP APPLICATOR SPRAY EXTEND 16 (VASCULAR PRODUCTS) IMPLANT
TOWEL GREEN STERILE (TOWEL DISPOSABLE) ×3 IMPLANT
TRAY FOLEY MTR SLVR 16FR STAT (SET/KITS/TRAYS/PACK) ×3 IMPLANT
TROCAR BLADELESS 15MM (ENDOMECHANICALS) IMPLANT
TUBING EXTENTION W/L.L. (IV SETS) ×3 IMPLANT
WATER STERILE IRR 1000ML POUR (IV SOLUTION) ×3 IMPLANT

## 2020-05-13 NOTE — Op Note (Signed)
      [address]       [place] [address]             [phone]        05/13/2020  Patient:  [name] Last Pre-Op Dx:  Left spontaneous pneumothorax   Bullous emphysema Post-op Dx: Same Procedure: - Robotic assisted left video thoracoscopy - Wedge resection of the left upper lobe - Apical pleurectomy - Mechanical pleurodesis - Intercostal nerve block  Surgeon and Role:      * Leilynn Pilat, Lucile Crater, MD - Primary    *M. Roddenberry, PA-C- assisting  Anesthesia  general EBL: Minimal  Blood Administration: None Specimen: Left upper lobe wedge resection x2, apical pleura  Drains: 28 F argyle chest tube in left chest Counts: correct   Indications: This is a 69 year old gentleman with a history of bullous emphysema who was admitted to the hospital with pleuritic chest pain and shortness of breath.  He was noted to have a moderate sized pneumothorax.  Cross-sectional imaging did reveal significant bullous disease in his left upper lobe with a dominant bleb.  Findings: A left upper lobe had large bulla.  There are also several small bulla along the periphery of the lung.  Operative Technique: After the risks, benefits and alternatives were thoroughly discussed, the patient was brought to the operative theatre.  Anesthesia was induced, and the patient was then placed in a right lateral decubitus position and was prepped and draped in normal sterile fashion.  An appropriate surgical pause was performed, and pre-operative antibiotics were dosed accordingly.  We began by placing our 4 robotic ports in the the 7th intercostal space targeting the hilum of the lung.  A 44mm assistant port was placed in the 9th intercostal space in the anterior axillary line.  The robot was then docked and all instruments were passed under direct visualization.    The lung was then retracted superiorly, and the inferior pulmonary ligament was divided.  The lung was freed of all  pleural adhesions.  Wedge resections of the left upper lobe were performed.  The apical parietal pleural was removed with a combination of cautery and blunt dissection.  The chest was irrigated, and an air leak test was performed.  An intercostal nerve block was performed under direct visualization.  A mechanical pleurodesis was then performed.  A 28 F chest with then placed, and we watch the remaining lobes re-expand.  The skin and soft tissue were closed with absorbable suture.    The patient tolerated the procedure without any immediate complications, and was transferred to the PACU in stable condition.  Andrea Ferrer Bary Leriche

## 2020-05-13 NOTE — Anesthesia Postprocedure Evaluation (Signed)
Anesthesia Post Note  Patient: [name]  Procedure(s) Performed: XI ROBOTIC ASSISTED THORASCOPY-WEDGE RESECTION apical pleurectomy mechanical pleurodesis (Left Chest) INTERCOSTAL NERVE BLOCK (Left Chest)     Patient location during evaluation: PACU Anesthesia Type: General Level of consciousness: awake and alert Pain management: pain level controlled Vital Signs Assessment: post-procedure vital signs reviewed and stable Respiratory status: spontaneous breathing, nonlabored ventilation, respiratory function stable and patient connected to nasal cannula oxygen Cardiovascular status: blood pressure returned to baseline and stable Postop Assessment: no apparent nausea or vomiting Anesthetic complications: no   No complications documented.  Last Vitals:  Vitals:   05/13/20 1330 05/13/20 1346  BP: 133/79 (!) 146/80  Pulse: 72 61  Resp: 12 10  Temp: 36.4 C   SpO2: 98% 99%    Last Pain:  Vitals:   05/13/20 1346  TempSrc:   PainSc: 0-No pain                 Audry Pili

## 2020-05-13 NOTE — Progress Notes (Signed)
     [address]       [place] [address]             [phone]       No events overnight Today's Vitals   05/12/20 1920 05/13/20 0028 05/13/20 0457 05/13/20 0753  BP:  (!) 158/95 128/88 (!) 142/95  Pulse:  72 67 76  Resp:  18 16 17   Temp:  99.4 F (37.4 C) 97.8 F (36.6 C) 97.8 F (36.6 C)  TempSrc:  Oral Oral Oral  SpO2:  98% 98% 98%  Weight:      Height:      PainSc: 0-No pain      Body mass index is 24.1 kg/m.  Alert NAD Sinus EWOB  69 yo male with L spontaneous pneumothorax OR for L RATS, wedge resection, apical pleurectomy, mechanical pleurodesis  [name] O Amiliah Campisi

## 2020-05-13 NOTE — Anesthesia Procedure Notes (Signed)
Procedure Name: Intubation Date/Time: 05/13/2020 11:10 AM Performed by: Renato Shin, CRNA Pre-anesthesia Checklist: Patient identified, Emergency Drugs available, Suction available and Patient being monitored Patient Re-evaluated:Patient Re-evaluated prior to induction Oxygen Delivery Method: Circle system utilized Preoxygenation: Pre-oxygenation with 100% oxygen Induction Type: IV induction Ventilation: Mask ventilation without difficulty Laryngoscope Size: Miller and 3 Grade View: Grade I Tube type: Oral Endobronchial tube: Left, EBT position confirmed by auscultation and EBT position confirmed by fiberoptic bronchoscope and 41 Fr Number of attempts: 1 Airway Equipment and Method: Stylet and Oral airway Placement Confirmation: ETT inserted through vocal cords under direct vision,  positive ETCO2 and breath sounds checked- equal and bilateral Tube secured with: Tape Dental Injury: Teeth and Oropharynx as per pre-operative assessment

## 2020-05-13 NOTE — Progress Notes (Addendum)
Patient ID: [name], male   DOB: 1951-05-21, 69 y.o.   MRN: [id number]  PROGRESS NOTE    [name]  XFG:[id number] DOB: [birth year] DOA: 05/10/2020 PCP: [name], MD   Brief Narrative:  69 y.o. male with medical history significant for paroxysmal A. fib on Eliquis, essential hypertension, prostate cancer, prior unspecified CVA with left-sided weakness who presented to [facility] ED at his PCPs request due to newly found pneumothorax on chest x-ray.  On presentation, CT chest showed large anterior left-sided pneumothorax.  Cardiothoracic surgery/Dr. [name] was consulted who recommended admission to [facility] service.   Assessment & Plan:   Spontaneous left sided anterior pneumothorax -Initially presented at his PCPs office with complaints of productive cough and chest congestion.  Around the same time had chest discomfort and shortness of breath.  Had a chest x-ray done outpatient which showed pneumothorax.  Was sent to the ED for further evaluation and management. -CT chest noncontrast revealed left-sided anterior pneumothorax. -CT surgery/Dr. [name] following: Possible surgical intervention today. -Currently on room air and not tachypneic.  Paroxysmal A. Fib -Currently rate controlled.  Eliquis on hold.  Continue heparin drip an hour prior to surgery.  Hypertension -Blood pressure intermittently on the higher side.  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  A1c 7.2.  Continue gabapentin.  Patient refuses to have car modified diet and hence is on a regular diet.  Hyperlipidemia -Continue home Pravachol  CKD stage II -Creatinine stable.  Monitor  Tobacco use disorder -Was counseled by admitting provider regarding tobacco cessation  Medical noncompliance -Apparently takes Eliquis only once a day. Patient stopped taking Pravachol >2 months ago because he ran out -He also stopped taking any  diabetic medications months ago  DVT prophylaxis: Heparin drip Code Status: Full Family Communication: None at bedside Disposition Plan: Status is: Inpatient  Remains inpatient appropriate because:Inpatient level of care appropriate due to severity of illness   Dispo:  Patient From: Home  Planned Disposition: Home  Expected discharge date: 3 days  Medically stable for discharge: No  Consultants: CT surgery  Procedures: None  Antimicrobials: None  Subjective: Patient seen and examined at bedside.  Denies worsening shortness of breath or chest pain.  No overnight fever or vomiting reported.  Objective: Vitals:   05/12/20 1120 05/12/20 1654 05/13/20 0028 05/13/20 0457  BP: (!) 156/88 133/86 (!) 158/95 128/88  Pulse: 79 70 72 67  Resp: 20 17 18 16   Temp: 97.6 F (36.4 C) 97.7 F (36.5 C) 99.4 F (37.4 C) 97.8 F (36.6 C)  TempSrc: Oral Oral Oral Oral  SpO2: 94% 97% 98% 98%  Weight:      Height:        Intake/Output Summary (Last 24 hours) at 05/13/2020 0720 Last data filed at 05/12/2020 1800 Gross per 24 hour  Intake 515.95 ml  Output --  Net 515.95 ml   Filed Weights   05/10/20 1452 05/10/20 2149  Weight: 86.6 kg 82.9 kg    Examination:  General exam: No acute distress.   Respiratory system: Bilateral breath sounds at bases with no wheezing, some scattered crackles  cardiovascular system: S1-S2 heard, rate controlled  gastrointestinal system: Abdomen is nondistended, soft and nontender.  Normal bowel sounds heard  extremities: No clubbing or cyanosis   Data Reviewed: I have personally reviewed following labs and imaging studies  CBC: Recent Labs  Lab 05/10/20 1614 05/11/20 0707 05/12/20 0228 05/13/20 0148  WBC 5.0  4.3 3.9* 4.4  NEUTROABS 3.1  --   --   --   HGB 14.2 13.0 12.8* 13.4  HCT 42.6 39.0 38.3* 39.2  MCV 90.6 90.7 89.9 89.3  PLT 251 222 237 024   Basic Metabolic Panel: Recent Labs  Lab 05/10/20 1614 05/11/20 0707 05/12/20 0228  05/13/20 0148  NA 138 138 138 138  K 4.2 3.9 4.0 3.9  CL 105 108 105 107  CO2 23 21* 24 23  GLUCOSE 199* 123* 139* 137*  BUN 14 14 12 15   CREATININE 1.25* 1.17 1.04 1.31*  CALCIUM 9.1 8.5* 8.7* 8.8*  MG  --   --  2.1  --    GFR: Estimated Creatinine Clearance: 61 mL/min (A) (by C-G formula based on SCr of 1.31 mg/dL (H)). Liver Function Tests: Recent Labs  Lab 05/10/20 1614  AST 19  ALT 13  ALKPHOS 72  BILITOT 0.8  PROT 7.0  ALBUMIN 3.6   No results for input(s): LIPASE, AMYLASE in the last 168 hours. No results for input(s): AMMONIA in the last 168 hours. Coagulation Profile: Recent Labs  Lab 05/10/20 1614  INR 0.9   Cardiac Enzymes: No results for input(s): CKTOTAL, CKMB, CKMBINDEX, TROPONINI in the last 168 hours. BNP (last 3 results) No results for input(s): PROBNP in the last 8760 hours. HbA1C: Recent Labs    05/10/20 1614  HGBA1C 7.2*   CBG: Recent Labs  Lab 05/10/20 2105  GLUCAP 138*   Lipid Profile: No results for input(s): CHOL, HDL, LDLCALC, TRIG, CHOLHDL, LDLDIRECT in the last 72 hours. Thyroid Function Tests: No results for input(s): TSH, T4TOTAL, FREET4, T3FREE, THYROIDAB in the last 72 hours. Anemia Panel: No results for input(s): VITAMINB12, FOLATE, FERRITIN, TIBC, IRON, RETICCTPCT in the last 72 hours. Sepsis Labs: No results for input(s): PROCALCITON, LATICACIDVEN in the last 168 hours.  Recent Results (from the past 240 hour(s))  Respiratory Panel by RT PCR (Flu A&B, Covid) - Nasopharyngeal Swab     Status: None   Collection Time: 05/10/20  4:40 PM   Specimen: Nasopharyngeal Swab; Nasopharyngeal(NP) swabs in vial transport medium  Result Value Ref Range Status   SARS Coronavirus 2 by RT PCR NEGATIVE NEGATIVE Final    Comment: (NOTE) SARS-CoV-2 target nucleic acids are NOT DETECTED.  The SARS-CoV-2 RNA is generally detectable in upper respiratoy specimens during the acute phase of infection. The lowest concentration of SARS-CoV-2  viral copies this assay can detect is 131 copies/mL. A negative result does not preclude SARS-Cov-2 infection and should not be used as the sole basis for treatment or other patient management decisions. A negative result may occur with  improper specimen collection/handling, submission of specimen other than nasopharyngeal swab, presence of viral mutation(s) within the areas targeted by this assay, and inadequate number of viral copies (<131 copies/mL). A negative result must be combined with clinical observations, patient history, and epidemiological information. The expected result is Negative.  Fact Sheet for Patients:  PinkCheek.be  Fact Sheet for Healthcare Providers:  GravelBags.it  This test is no t yet approved or cleared by the [place] FDA and  has been authorized for detection and/or diagnosis of SARS-CoV-2 by FDA under an Emergency Use Authorization (EUA). This EUA will remain  in effect (meaning this test can be used) for the duration of the COVID-19 declaration under Section 564(b)(1) of the Act, 21 U.S.C. section 360bbb-3(b)(1), unless the authorization is terminated or revoked sooner.     Influenza A by PCR NEGATIVE NEGATIVE Final  Influenza B by PCR NEGATIVE NEGATIVE Final    Comment: (NOTE) The Xpert Xpress SARS-CoV-2/FLU/RSV assay is intended as an aid in  the diagnosis of influenza from Nasopharyngeal swab specimens and  should not be used as a sole basis for treatment. Nasal washings and  aspirates are unacceptable for Xpert Xpress SARS-CoV-2/FLU/RSV  testing.  Fact Sheet for Patients: PinkCheek.be  Fact Sheet for Healthcare Providers: GravelBags.it  This test is not yet approved or cleared by the [place] FDA and  has been authorized for detection and/or diagnosis of SARS-CoV-2 by  FDA under an Emergency Use Authorization (EUA).  This EUA will remain  in effect (meaning this test can be used) for the duration of the  Covid-19 declaration under Section 564(b)(1) of the Act, 21  U.S.C. section 360bbb-3(b)(1), unless the authorization is  terminated or revoked. Performed at [facility], [place] [address], [place], [place]  [address]   Surgical pcr screen     Status: None   Collection Time: 05/12/20  6:37 PM   Specimen: Nasal Mucosa; Nasal Swab  Result Value Ref Range Status   MRSA, PCR NEGATIVE NEGATIVE Final   Staphylococcus aureus NEGATIVE NEGATIVE Final    Comment: (NOTE) The Xpert SA Assay (FDA approved for NASAL specimens in patients 19 years of age and older), is one component of a comprehensive surveillance program. It is not intended to diagnose infection nor to guide or monitor treatment. Performed at [facility], [place] [address], [place], [place] [address]          Radiology Studies: No results found.      Scheduled Meds: . amLODipine  10 mg Oral Daily  . B-complex with vitamin C  1 tablet Oral Daily  . gabapentin  100 mg Oral Q1200  . insulin aspart  0-5 Units Subcutaneous QHS  . insulin aspart  0-9 Units Subcutaneous TID WC  . multivitamin with minerals  1 tablet Oral Daily  . pravastatin  10 mg Oral Daily   Continuous Infusions: . heparin 1,300 Units/hr (05/12/20 1800)          Aline August, MD [facility] 05/13/2020, 7:20 AM

## 2020-05-13 NOTE — Transfer of Care (Signed)
Immediate Anesthesia Transfer of Care Note  Patient: Juniper C Neider  Procedure(s) Performed: XI ROBOTIC ASSISTED THORASCOPY-WEDGE RESECTION apical pleurectomy mechanical pleurodesis (Left Chest) INTERCOSTAL NERVE BLOCK (Left Chest)  Patient Location: PACU  Anesthesia Type:General  Level of Consciousness: drowsy and patient cooperative  Airway & Oxygen Therapy: Patient Spontanous Breathing and Patient connected to face mask oxygen  Post-op Assessment: Report given to RN and Post -op Vital signs reviewed and stable  Post vital signs: Reviewed and stable  Last Vitals:  Vitals Value Taken Time  BP 151/79 05/13/20 1259  Temp    Pulse 78 05/13/20 1302  Resp 15 05/13/20 1302  SpO2 97 % 05/13/20 1302  Vitals shown include unvalidated device data.  Last Pain:  Vitals:   05/13/20 0810  TempSrc:   PainSc: 0-No pain      Patients Stated Pain Goal: 0 (96/72/89 7915)  Complications: No complications documented.

## 2020-05-13 NOTE — Brief Op Note (Signed)
05/10/2020 - 05/13/2020  12:41 PM  PATIENT:  Atlee C Polidore  69 y.o. male  PRE-OPERATIVE DIAGNOSIS:  spontaneous pneumothorax  POST-OPERATIVE DIAGNOSIS:  spontaneous pneumothorax, bullous emphysema  PROCEDURES:    -XI ROBOTIC ASSISTED THORASCOPY-WEDGE RESECTION OF RIGHT UPPER LOBE BLEBS  -LEFT APICAL PLEURECTOMY  -MECHANICAL PLEURODESIS  -INTERCOSTAL NERVE BLOCK (Left)   SURGEON: Lajuana Matte, MD - Primary  PHYSICIAN ASSISTANT: Prairie Stenberg  ANESTHESIA:   general  EBL:  50 mL   BLOOD ADMINISTERED:none  DRAINS: 44fr left pleural tube   LOCAL MEDICATIONS USED:  Left intercostal nerve block with Exparel  SPECIMEN:  Source of Specimen:  Left apical pleura, left upper lobe wedge resection x 2  DISPOSITION OF SPECIMEN:  PATHOLOGY  COUNTS:  YES  DICTATION: .Dragon Dictation  PLAN OF CARE: Admit to inpatient   PATIENT DISPOSITION:  PACU - hemodynamically stable.   Delay start of Pharmacological VTE agent (>24hrs) due to surgical blood loss or risk of bleeding: yes

## 2020-05-13 NOTE — Progress Notes (Signed)
ANTICOAGULATION CONSULT NOTE - Follow Up Consult  Pharmacy Consult for Heparin Indication: h/o afib and CVA  Allergies  Allergen Reactions  . Lisinopril Swelling    Throat swelling    Patient Measurements: Height: 6\' 1"  (185.4 cm) Weight: 82.9 kg (182 lb 11.2 oz) IBW/kg (Calculated) : 79.9 Heparin Dosing Weight:  82.9 kg  Vital Signs: Temp: 97.8 F (36.6 C) (11/22 0457) Temp Source: Oral (11/22 0457) BP: 128/88 (11/22 0457) Pulse Rate: 67 (11/22 0457)  Labs: Recent Labs    05/10/20 1614 05/10/20 1614 05/11/20 0707 05/11/20 0707 05/11/20 1430 05/12/20 0228 05/13/20 0148  HGB 14.2   < > 13.0   < >  --  12.8* 13.4  HCT 42.6   < > 39.0  --   --  38.3* 39.2  PLT 251   < > 222  --   --  237 232  APTT  --   --  50*  --  77* 96*  --   LABPROT 12.2  --   --   --   --   --   --   INR 0.9  --   --   --   --   --   --   HEPARINUNFRC  --   --  0.34  --   --  0.66 0.62  CREATININE 1.25*   < > 1.17  --   --  1.04 1.31*   < > = values in this interval not displayed.    Estimated Creatinine Clearance: 61 mL/min (A) (by C-G formula based on SCr of 1.31 mg/dL (H)).   Assessment: Anticoag: Eliquis PTA for Afib and CVA (LD 11/19), now on heparin. Hep level 0.62 in goal. Hgb 13.4 and plts 232 WNL - MD notes pt only taking Eliquis ONCE daily PTA.   Goal of Therapy:  Heparin level 0.3-0.7 units/ml Monitor platelets by anticoagulation protocol: Yes   Plan:  Continue heparin rate at 1,300 units/hr Daily HL and CBC   Ahsley Attwood S. Alford Highland, PharmD, BCPS Clinical Staff Pharmacist [email or web address] Alford Highland, [employer] 05/13/2020,7:52 AM

## 2020-05-13 NOTE — Anesthesia Procedure Notes (Signed)
Arterial Line Insertion Start/End11/22/2021 10:30 AM, 05/13/2020 10:40 AM Performed by: Renato Shin, CRNA, CRNA  Patient location: Pre-op. Preanesthetic checklist: patient identified, IV checked, site marked, risks and benefits discussed, surgical consent, monitors and equipment checked, pre-op evaluation, timeout performed and anesthesia consent Lidocaine 1% used for infiltration Left, radial was placed Catheter size: 20 G Hand hygiene performed , maximum sterile barriers used  and Seldinger technique used Allen's test indicative of satisfactory collateral circulation Attempts: 2 Procedure performed without using ultrasound guided technique. Following insertion, dressing applied and Biopatch. Post procedure assessment: normal  Post procedure complications: local hematoma. Patient tolerated the procedure well with no immediate complications.

## 2020-05-14 ENCOUNTER — Inpatient Hospital Stay (HOSPITAL_COMMUNITY): Payer: Medicare HMO

## 2020-05-14 ENCOUNTER — Encounter (HOSPITAL_COMMUNITY): Payer: Self-pay | Admitting: Thoracic Surgery (Cardiothoracic Vascular Surgery)

## 2020-05-14 DIAGNOSIS — J9311 Primary spontaneous pneumothorax: Secondary | ICD-10-CM | POA: Diagnosis not present

## 2020-05-14 DIAGNOSIS — I480 Paroxysmal atrial fibrillation: Secondary | ICD-10-CM | POA: Diagnosis not present

## 2020-05-14 DIAGNOSIS — I10 Essential (primary) hypertension: Secondary | ICD-10-CM | POA: Diagnosis not present

## 2020-05-14 LAB — GLUCOSE, CAPILLARY
Glucose-Capillary: 153 mg/dL — ABNORMAL HIGH (ref 70–99)
Glucose-Capillary: 151 mg/dL — ABNORMAL HIGH (ref 70–99)

## 2020-05-14 LAB — BASIC METABOLIC PANEL
Anion gap: 13 (ref 5–15)
BUN: 19 mg/dL (ref 8–23)
CO2: 22 mmol/L (ref 22–32)
Calcium: 9.1 mg/dL (ref 8.9–10.3)
Chloride: 100 mmol/L (ref 98–111)
Creatinine, Ser: 1.42 mg/dL — ABNORMAL HIGH (ref 0.61–1.24)
GFR, Estimated: 54 mL/min — ABNORMAL LOW (ref >60)
Glucose, Bld: 123 mg/dL — ABNORMAL HIGH (ref 70–99)
Potassium: 4.5 mmol/L (ref 3.5–5.1)
Sodium: 135 mmol/L (ref 135–145)

## 2020-05-14 LAB — MAGNESIUM: Magnesium: 2.2 mg/dL (ref 1.7–2.4)

## 2020-05-14 LAB — CBC
HCT: 41.6 % (ref 39.0–52.0)
Hemoglobin: 14 g/dL (ref 13.0–17.0)
MCH: 29.9 pg (ref 26.0–34.0)
MCHC: 33.7 g/dL (ref 30.0–36.0)
MCV: 88.7 fL (ref 80.0–100.0)
Platelets: 256 10*3/uL (ref 150–400)
RBC: 4.69 MIL/uL (ref 4.22–5.81)
RDW: 13.2 % (ref 11.5–15.5)
WBC: 9.7 10*3/uL (ref 4.0–10.5)
nRBC: 0 % (ref 0.0–0.2)

## 2020-05-14 MED ORDER — PROCHLORPERAZINE EDISYLATE 10 MG/2ML IJ SOLN
10.0000 mg | Freq: Four times a day (QID) | INTRAMUSCULAR | Status: DC | PRN
  Administered 2020-05-14 (×2): 10 mg via INTRAVENOUS
  Filled 2020-05-14 (×3): qty 2

## 2020-05-14 NOTE — Progress Notes (Addendum)
      [address]       [place] [address]             [phone]       1 Day Post-Op Procedure(s) (LRB): XI ROBOTIC ASSISTED THORASCOPY-WEDGE RESECTION apical pleurectomy mechanical pleurodesis (Left) INTERCOSTAL NERVE BLOCK (Left)  Subjective: Patient without complaints this am.  Objective: Vital signs in last 24 hours: Temp:  [97.4 F (36.3 C)-98.2 F (36.8 C)] 97.9 F (36.6 C) (11/23 0511) Pulse Rate:  [61-92] 76 (11/23 0511) Cardiac Rhythm: Normal sinus rhythm (11/23 0505) Resp:  [10-20] 16 (11/23 0511) BP: (117-151)/(76-97) 117/81 (11/23 0511) SpO2:  [92 %-99 %] 97 % (11/23 0511) Arterial Line BP: (154-166)/(65-78) 162/68 (11/22 1346)     Intake/Output from previous day: 11/22 0701 - 11/23 0700 In: 1698.5 [P.O.:240; I.V.:1158.5; IV Piggyback:300] Out: 1389 [Urine:1175; Blood:50; Chest Tube:164]   Physical Exam:  Cardiovascular: RRR Pulmonary: Clear to auscultation bilaterally Abdomen: Soft, non tender, bowel sounds present. Extremities: SCDs in place Wounds: Clean and dry.  No erythema or signs of infection. Chest Tube: to suction, no air leak  Lab Results: SVX:[id number] Labs    05/13/20 0148 05/14/20 0039  WBC 4.4 9.7  HGB 13.4 14.0  HCT 39.2 41.6  PLT 232 256   BMET:  Recent Labs    05/13/20 0148 05/14/20 0039  NA 138 135  K 3.9 4.5  CL 107 100  CO2 23 22  GLUCOSE 137* 123*  BUN 15 19  CREATININE 1.31* 1.42*  CALCIUM 8.8* 9.1    PT/INR: No results for input(s): LABPROT, INR in the last 72 hours. ABG:  INR: Will add last result for INR, ABG once components are confirmed Will add last 4 CBG results once components are confirmed  Assessment/Plan:  1. CV - SR with HR 70-80's 2.  Pulmonary - On room air. Chest tube is to suction. There is no air leak. Chest tube with 164 cc since surgery. CXR this am appears stable (no pneumothorax). Likely keep ct on suction today and water seal in am. Encourage incentive spirometer.  Check CXR 3. DM-CBGs 168/189/153. Diet controlled. Continue accu checks and SS PRN   Donielle M ZimmermanPA-C 05/14/2020,8:03 AM [phone]  Agree with above. Doing well. We will transition to waterseal tomorrow and consider removing chest tube tomorrow afternoon. Dispo planning for Thursday.  Emillia Weatherly Bary Leriche

## 2020-05-14 NOTE — Discharge Instructions (Signed)
Robot-Assisted Thoracic Surgery, Care After This sheet gives you information about how to care for yourself after your procedure. Your health care provider may also give you more specific instructions. If you have problems or questions, contact your health care provider. What can I expect after the procedure? After the procedure, it is common to have:  Some pain and aches in the area of your surgical cuts (incisions).  Pain when breathing in (inhaling) and coughing.  Tiredness (fatigue).  Trouble sleeping.  Constipation. Follow these instructions at home: Medicines  Take over-the-counter and prescription medicines only as told by your health care provider.  If you were prescribed an antibiotic medicine, take it as told by your health care provider. Do not stop taking the antibiotic even if you start to feel better.  Talk with your health care provider about safe and effective ways to manage pain after your procedure. Pain management should fit your specific health needs.  Take prescription pain medicine before pain becomes severe. Relieving and controlling your pain will make breathing easier for you. Activity  Return to your normal activities as told by your health care provider. Ask your health care provider what activities are safe for you.  Do not lift anything that is heavier than 10 lb (4.5 kg), or the limit that you are told, until your health care provider says that it is safe.  Avoid sitting for a long time without moving. Get up and move around one or more times every few hours. Bathing  Do not take baths, swim, or use a hot tub until your health care provider approves. You may take showers. Incision care  Follow instructions from your health care provider about how to take care of your incision(s). Make sure you: ? Wash your hands with soap and water before you change your bandage (dressing). If soap and water are not available, use hand sanitizer. ? Change your  dressing as told by your health care provider. ? Leave stitches (sutures), skin glue, or adhesive strips in place. These skin closures may need to stay in place for 2 weeks or longer. If adhesive strip edges start to loosen and curl up, you may trim the loose edges. Do not remove adhesive strips completely unless your health care provider tells you to do that.  Check your incision area every day for signs of infection. Check for: ? Redness, swelling, or pain. ? Fluid or blood. ? Warmth. ? Pus or a bad smell. Driving  Ask your health care provider when it is safe for you to drive.  Do not drive or use heavy machinery while taking prescription pain medicine. Eating and drinking  Follow instructions from your health care provider about eating or drinking restrictions. These will vary depending on what procedure you had. Your health care provider may recommend: ? A liquid diet or soft diet for the first few days. ? Meals that are smaller and more frequent. ? A diet of fruits, vegetables, whole grains, and low-fat proteins. ? Limiting foods that are high in processed sugar and fat, including fried and sweet foods. Pneumonia prevention   Do not use any products that contain nicotine or tobacco, such as cigarettes and e-cigarettes. If you need help quitting, ask your health care provider.  Avoid secondhand smoke.  Do deep breathing exercises and cough regularly as directed. This helps to clear mucus and prevent pneumonia. If it hurts to cough, try one of these methods to ease your pain when you cough: ? Hold a   pillow against your chest. ? Place the palms of both hands over your incisions (use splinting).  Use an incentive spirometer as directed. This device measures how much air your lungs are getting with each breath. Using this will improve your breathing.  Do pulmonary rehabilitation as directed. This is a program that includes exercise, education, and support. General  instructions  Wear compression stockings as told by your health care provider. These stockings help to prevent blood clots and reduce swelling in your legs.  If you have a drainage tube: ? Follow instructions from your health care provider about how to take care of it. ? Do not travel by airplane after your tube is removed until your health care provider tells you it is safe.  To prevent or treat constipation while you are taking prescription pain medicine, your health care provider may recommend that you: ? Drink enough fluid to keep your urine pale yellow. ? Take over-the-counter or prescription medicines. ? Eat foods that are high in fiber, such as fresh fruits and vegetables, whole grains, and beans. ? Limit foods that are high in fat and processed sugars, such as fried and sweet foods.  Keep all follow-up visits as told by your health care provider. This is important. Contact a health care provider if:  You have redness, swelling, or pain around an incision.  You have fluid or blood coming from an incision.  An incision feels warm to the touch.  You have pus or a bad smell coming from an incision.  You have a fever.  You cannot eat or drink without vomiting.  Your prescription pain medicine is not controlling your pain. Get help right away if:  You have chest pain.  Your heart is beating quickly.  You have trouble breathing.  You have trouble speaking.  You are confused.  You feel weak or dizzy, or you faint. These symptoms may represent a serious problem that is an emergency. Do not wait to see if the symptoms will go away. Get medical help right away. Call your local emergency services (911 in the [place] Do not drive yourself to the hospital. Summary  Talk with your health care provider about safe and effective ways to manage pain after your procedure. Pain management should fit your specific health needs.  Return to your normal activities as told by your health  care provider. Ask your health care provider what activities are safe for you.  Do deep breathing exercises and cough regularly as directed. This helps to clear mucus and prevent pneumonia. If it hurts to cough, ease pain by holding a pillow against your chest or by placing the palms of both hands over your incisions (splinting). This information is not intended to replace advice given to you by your health care provider. Make sure you discuss any questions you have with your health care provider. Document Revised: 04/07/2019 Document Reviewed: 10/12/2016 Elsevier Patient Education  2020 [employer]  

## 2020-05-14 NOTE — Progress Notes (Signed)
Patient ID: [name], male   DOB: Oct 26, 1950, 69 y.o.   MRN: [id number]  PROGRESS NOTE    [name]  EPP:[id number] DOB: April 27, 1951 DOA: 05/10/2020 PCP: [name], MD   Brief Narrative:  69 y.o. male with medical history significant for paroxysmal A. fib on Eliquis, essential hypertension, prostate cancer, prior unspecified CVA with left-sided weakness who presented to [facility] ED at his PCPs request due to newly found pneumothorax on chest x-ray.  On presentation, CT chest showed large anterior left-sided pneumothorax.  Cardiothoracic surgery/Dr. [name] was consulted who recommended admission to [facility] service.   Assessment & Plan:   Spontaneous left sided anterior pneumothorax -Initially presented at his PCPs office with complaints of productive cough and chest congestion.  Around the same time had chest discomfort and shortness of breath.  Had a chest x-ray done outpatient which showed pneumothorax.  Was sent to the ED for further evaluation and management. -CT chest noncontrast revealed left-sided anterior pneumothorax. -CT surgery/Dr. [name] following: Underwent VATS with wedge resection of left upper lobe, apical pleurectomy and mechanical pleurodesis on 05/13/2020 by CT surgery.  Currently has a chest tube.  Follow more recommendations from CT surgery. -Currently on room air  Paroxysmal A. Fib -Currently intermittently tachycardic.  Eliquis has been resumed by cardiothoracic surgery.  Hypertension -Blood pressure currently stable.  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  A1c 7.2.  Continue gabapentin.  Patient refuses to have car modified diet and hence is on a regular diet.  Hyperlipidemia -Continue home Pravachol  CKD stage II -Creatinine stable.  Monitor  Tobacco use disorder -Was counseled by admitting provider regarding tobacco cessation  Medical noncompliance -Apparently takes  Eliquis only once a day. Patient stopped taking Pravachol >2 months ago because he ran out -He also stopped taking any diabetic medications months ago  DVT prophylaxis: Eliquis Code Status: Full Family Communication: None at bedside Disposition Plan: Status is: Inpatient  Remains inpatient appropriate because:Inpatient level of care appropriate due to severity of illness   Dispo:  Patient From: Home  Planned Disposition: Home  Expected discharge date: 2 days  Medically stable for discharge: No  Consultants: CT surgery  Procedures:  Thoracic surgical procedure on 05/13/2020: - Robotic assisted left video thoracoscopy - Wedge resection of the left upper lobe - Apical pleurectomy - Mechanical pleurodesis - Intercostal nerve block  Antimicrobials: None  Subjective: Patient seen and examined at bedside.  Complains of some left chest wall pain.  Denies worsening shortness of breath, cough, fever or vomiting.  Objective: Vitals:   05/13/20 1643 05/13/20 2005 05/13/20 2333 05/14/20 0511  BP: 124/86 134/85 125/84 117/81  Pulse: 90 92 70 76  Resp: 18 20 16 16   Temp: 97.7 F (36.5 C) 98 F (36.7 C) 98.2 F (36.8 C) 97.9 F (36.6 C)  TempSrc: Oral Oral Oral Oral  SpO2: 96% 96% 96% 97%  Weight:      Height:        Intake/Output Summary (Last 24 hours) at 05/14/2020 0727 Last data filed at 05/14/2020 0515 Gross per 24 hour  Intake 1698.46 ml  Output 1389 ml  Net 309.46 ml   Filed Weights   05/10/20 1452 05/10/20 2149  Weight: 86.6 kg 82.9 kg    Examination:  General exam: No distress. Respiratory system: Bilateral breath sounds at bases with with some crackles, left-sided chest tube present.  Intermittently tachypneic cardiovascular system: Intermittently tachycardic, S1-S2 heard gastrointestinal system: Abdomen  is nondistended, soft and nontender.  Bowel sounds are heard  extremities: No lower extremity edema or cyanosis.   Data Reviewed: I have personally  reviewed following labs and imaging studies  CBC: Recent Labs  Lab 05/10/20 1614 05/11/20 0707 05/12/20 0228 05/13/20 0148 05/14/20 0039  WBC 5.0 4.3 3.9* 4.4 9.7  NEUTROABS 3.1  --   --   --   --   HGB 14.2 13.0 12.8* 13.4 14.0  HCT 42.6 39.0 38.3* 39.2 41.6  MCV 90.6 90.7 89.9 89.3 88.7  PLT 251 222 237 232 500   Basic Metabolic Panel: Recent Labs  Lab 05/10/20 1614 05/11/20 0707 05/12/20 0228 05/13/20 0148 05/14/20 0039  NA 138 138 138 138 135  K 4.2 3.9 4.0 3.9 4.5  CL 105 108 105 107 100  CO2 23 21* 24 23 22   GLUCOSE 199* 123* 139* 137* 123*  BUN 14 14 12 15 19   CREATININE 1.25* 1.17 1.04 1.31* 1.42*  CALCIUM 9.1 8.5* 8.7* 8.8* 9.1  MG  --   --  2.1  --  2.2   GFR: Estimated Creatinine Clearance: 56.3 mL/min (A) (by C-G formula based on SCr of 1.42 mg/dL (H)). Liver Function Tests: Recent Labs  Lab 05/10/20 1614  AST 19  ALT 13  ALKPHOS 72  BILITOT 0.8  PROT 7.0  ALBUMIN 3.6   No results for input(s): LIPASE, AMYLASE in the last 168 hours. No results for input(s): AMMONIA in the last 168 hours. Coagulation Profile: Recent Labs  Lab 05/10/20 1614  INR 0.9   Cardiac Enzymes: No results for input(s): CKTOTAL, CKMB, CKMBINDEX, TROPONINI in the last 168 hours. BNP (last 3 results) No results for input(s): PROBNP in the last 8760 hours. HbA1C: No results for input(s): HGBA1C in the last 72 hours. CBG: Recent Labs  Lab 05/12/20 2133 05/13/20 1005 05/13/20 1306 05/13/20 2131 05/14/20 0638  GLUCAP 123* 150* 168* 189* 153*   Lipid Profile: No results for input(s): CHOL, HDL, LDLCALC, TRIG, CHOLHDL, LDLDIRECT in the last 72 hours. Thyroid Function Tests: No results for input(s): TSH, T4TOTAL, FREET4, T3FREE, THYROIDAB in the last 72 hours. Anemia Panel: No results for input(s): VITAMINB12, FOLATE, FERRITIN, TIBC, IRON, RETICCTPCT in the last 72 hours. Sepsis Labs: No results for input(s): PROCALCITON, LATICACIDVEN in the last 168  hours.  Recent Results (from the past 240 hour(s))  Respiratory Panel by RT PCR (Flu A&B, Covid) - Nasopharyngeal Swab     Status: None   Collection Time: 05/10/20  4:40 PM   Specimen: Nasopharyngeal Swab; Nasopharyngeal(NP) swabs in vial transport medium  Result Value Ref Range Status   SARS Coronavirus 2 by RT PCR NEGATIVE NEGATIVE Final    Comment: (NOTE) SARS-CoV-2 target nucleic acids are NOT DETECTED.  The SARS-CoV-2 RNA is generally detectable in upper respiratoy specimens during the acute phase of infection. The lowest concentration of SARS-CoV-2 viral copies this assay can detect is 131 copies/mL. A negative result does not preclude SARS-Cov-2 infection and should not be used as the sole basis for treatment or other patient management decisions. A negative result may occur with  improper specimen collection/handling, submission of specimen other than nasopharyngeal swab, presence of viral mutation(s) within the areas targeted by this assay, and inadequate number of viral copies (<131 copies/mL). A negative result must be combined with clinical observations, patient history, and epidemiological information. The expected result is Negative.  Fact Sheet for Patients:  PinkCheek.be  Fact Sheet for Healthcare Providers:  GravelBags.it  This test is  no t yet approved or cleared by the [place] and  has been authorized for detection and/or diagnosis of SARS-CoV-2 by FDA under an Emergency Use Authorization (EUA). This EUA will remain  in effect (meaning this test can be used) for the duration of the COVID-19 declaration under Section 564(b)(1) of the Act, 21 U.S.C. section 360bbb-3(b)(1), unless the authorization is terminated or revoked sooner.     Influenza A by PCR NEGATIVE NEGATIVE Final   Influenza B by PCR NEGATIVE NEGATIVE Final    Comment: (NOTE) The Xpert Xpress SARS-CoV-2/FLU/RSV assay is intended  as an aid in  the diagnosis of influenza from Nasopharyngeal swab specimens and  should not be used as a sole basis for treatment. Nasal washings and  aspirates are unacceptable for Xpert Xpress SARS-CoV-2/FLU/RSV  testing.  Fact Sheet for Patients: PinkCheek.be  Fact Sheet for Healthcare Providers: GravelBags.it  This test is not yet approved or cleared by the [place] FDA and  has been authorized for detection and/or diagnosis of SARS-CoV-2 by  FDA under an Emergency Use Authorization (EUA). This EUA will remain  in effect (meaning this test can be used) for the duration of the  Covid-19 declaration under Section 564(b)(1) of the Act, 21  U.S.C. section 360bbb-3(b)(1), unless the authorization is  terminated or revoked. Performed at [facility], [place] [address], [employer], [place] [address]   Surgical pcr screen     Status: None   Collection Time: 05/12/20  6:37 PM   Specimen: Nasal Mucosa; Nasal Swab  Result Value Ref Range Status   MRSA, PCR NEGATIVE NEGATIVE Final   Staphylococcus aureus NEGATIVE NEGATIVE Final    Comment: (NOTE) The Xpert SA Assay (FDA approved for NASAL specimens in patients 44 years of age and older), is one component of a comprehensive surveillance program. It is not intended to diagnose infection nor to guide or monitor treatment. Performed at [facility], [place] [address] [place],  [address]          Radiology Studies: DG CHEST PORT 1 VIEW  Result Date: 05/14/2020 CLINICAL DATA:  Left chest tube. EXAM: PORTABLE CHEST 1 VIEW COMPARISON:  05/13/2020.  05/11/2020. FINDINGS: Mediastinum and hilar structures normal. Stable cardiomegaly. Postsurgical changes left upper lung again noted. Left chest tube in stable position. Interval resolution of large left pneumothorax noted on prior chest x-ray of 05/11/2020. Chest appears stable from prior study of 05/13/2020. No pleural  effusion. No acute bony abnormality. IMPRESSION: Postsurgical changes left upper lung again noted. Left chest tube in stable position. Interval resolution of large left pneumothorax noted on prior chest x-ray of 05/11/2020. Chest appears stable from prior study of 11 08/12/2019. Electronically Signed   By: Marcello Moores  Register   On: 05/14/2020 06:25   DG Chest Port 1 View  Result Date: 05/13/2020 CLINICAL DATA:  Status post apical pleurectomy with pleurodesis EXAM: PORTABLE CHEST 1 VIEW COMPARISON:  05/11/2020 FINDINGS: Cardiac shadow is stable. Previously seen left-sided pneumothorax has resolved following chest tube placement. Postsurgical changes in the apex are seen. Right lung remains well aerated. No acute bony abnormality is noted. IMPRESSION: Chest tube in place on the left with resolution of left-sided pneumothorax. No acute abnormality noted. Electronically Signed   By: Inez Catalina M.D.   On: 05/13/2020 13:34        Scheduled Meds:  acetaminophen  1,000 mg Oral Q6H   Or   acetaminophen (TYLENOL) oral liquid 160 mg/5 mL  1,000 mg Oral  Q6H   amLODipine  10 mg Oral Daily   apixaban  5 mg Oral BID   B-complex with vitamin C  1 tablet Oral Daily   bisacodyl  10 mg Oral Daily   gabapentin  100 mg Oral Q1200   insulin aspart  0-5 Units Subcutaneous QHS   insulin aspart  0-9 Units Subcutaneous TID WC   ketorolac  15 mg Intravenous Q6H   multivitamin with minerals  1 tablet Oral Daily   pravastatin  10 mg Oral Daily   senna-docusate  1 tablet Oral QHS   Continuous Infusions:         Scott August, MD [facility] 05/14/2020, 7:27 AM

## 2020-05-15 ENCOUNTER — Inpatient Hospital Stay (HOSPITAL_COMMUNITY): Payer: Medicare HMO

## 2020-05-15 DIAGNOSIS — J9311 Primary spontaneous pneumothorax: Secondary | ICD-10-CM | POA: Diagnosis not present

## 2020-05-15 LAB — COMPREHENSIVE METABOLIC PANEL
ALT: 41 U/L (ref 0–44)
AST: 47 U/L — ABNORMAL HIGH (ref 15–41)
Albumin: 3.3 g/dL — ABNORMAL LOW (ref 3.5–5.0)
Alkaline Phosphatase: 48 U/L (ref 38–126)
Anion gap: 8 (ref 5–15)
BUN: 23 mg/dL (ref 8–23)
CO2: 24 mmol/L (ref 22–32)
Calcium: 8.7 mg/dL — ABNORMAL LOW (ref 8.9–10.3)
Chloride: 104 mmol/L (ref 98–111)
Creatinine, Ser: 1.39 mg/dL — ABNORMAL HIGH (ref 0.61–1.24)
GFR, Estimated: 55 mL/min — ABNORMAL LOW (ref >60)
Glucose, Bld: 133 mg/dL — ABNORMAL HIGH (ref 70–99)
Potassium: 3.9 mmol/L (ref 3.5–5.1)
Sodium: 136 mmol/L (ref 135–145)
Total Bilirubin: 1 mg/dL (ref 0.3–1.2)
Total Protein: 6 g/dL — ABNORMAL LOW (ref 6.5–8.1)

## 2020-05-15 LAB — GLUCOSE, CAPILLARY
Glucose-Capillary: 127 mg/dL — ABNORMAL HIGH (ref 70–99)
Glucose-Capillary: 191 mg/dL — ABNORMAL HIGH (ref 70–99)
Glucose-Capillary: 151 mg/dL — ABNORMAL HIGH (ref 70–99)
Glucose-Capillary: 121 mg/dL — ABNORMAL HIGH (ref 70–99)
Glucose-Capillary: 169 mg/dL — ABNORMAL HIGH (ref 70–99)
Glucose-Capillary: 176 mg/dL — ABNORMAL HIGH (ref 70–99)
Glucose-Capillary: 201 mg/dL — ABNORMAL HIGH (ref 70–99)
Glucose-Capillary: 182 mg/dL — ABNORMAL HIGH (ref 70–99)

## 2020-05-15 LAB — CBC
HCT: 38.6 % — ABNORMAL LOW (ref 39.0–52.0)
Hemoglobin: 12.9 g/dL — ABNORMAL LOW (ref 13.0–17.0)
MCH: 29.9 pg (ref 26.0–34.0)
MCHC: 33.4 g/dL (ref 30.0–36.0)
MCV: 89.6 fL (ref 80.0–100.0)
Platelets: 226 10*3/uL (ref 150–400)
RBC: 4.31 MIL/uL (ref 4.22–5.81)
RDW: 13.3 % (ref 11.5–15.5)
WBC: 7.9 10*3/uL (ref 4.0–10.5)
nRBC: 0 % (ref 0.0–0.2)

## 2020-05-15 LAB — SURGICAL PATHOLOGY

## 2020-05-15 LAB — MAGNESIUM: Magnesium: 2 mg/dL (ref 1.7–2.4)

## 2020-05-15 NOTE — Progress Notes (Addendum)
      [address]       [place] [address]             [phone]      2 Days Post-Op Procedure(s) (LRB): XI ROBOTIC ASSISTED THORASCOPY-WEDGE RESECTION apical pleurectomy mechanical pleurodesis (Left) INTERCOSTAL NERVE BLOCK (Left) Subjective: Rested well.  No difficulty with breathing. Pain well controlled.   Objective: Vital signs in last 24 hours: Temp:  [97.5 F (36.4 C)-98.1 F (36.7 C)] 97.8 F (36.6 C) (11/24 0505) Pulse Rate:  [75] 75 (11/23 1523) Cardiac Rhythm: Normal sinus rhythm (11/23 1900) Resp:  [15-21] 15 (11/24 0505) BP: (123-140)/(77-90) 134/86 (11/24 0505) SpO2:  [95 %-100 %] 100 % (11/24 0505)     Intake/Output from previous day: 11/23 0701 - 11/24 0700 In: -  Out: 640 [Urine:500; Chest Tube:140] Intake/Output this shift: No intake/output data recorded.  General appearance: alert, cooperative and no distress Neurologic: intact Heart: regular rate and rhythm Lungs: Breath sounds are clear. No air leak. CXR shows good expansion of the left lung.  Wound: Port dressings are dry. CT secure.   Lab Results: Recent Labs    05/14/20 0039 05/15/20 0124  WBC 9.7 7.9  HGB 14.0 12.9*  HCT 41.6 38.6*  PLT 256 226   BMET:  Recent Labs    05/14/20 0039 05/15/20 0124  NA 135 136  K 4.5 3.9  CL 100 104  CO2 22 24  GLUCOSE 123* 133*  BUN 19 23  CREATININE 1.42* 1.39*  CALCIUM 9.1 8.7*    PT/INR: No results for input(s): LABPROT, INR in the last 72 hours. ABG    Component Value Date/Time   HCO3 24.3 (H) 03/31/2007 0909   TCO2 24 05/01/2014 1956   CBG (last 3)  Recent Labs    05/14/20 0638 05/14/20 1222 05/15/20 0619  GLUCAP 153* 151* 169*    Assessment/Plan: S/P Procedure(s) (LRB): XI ROBOTIC ASSISTED THORASCOPY-WEDGE RESECTION apical pleurectomy mechanical pleurodesis (Left) INTERCOSTAL NERVE BLOCK (Left)  -POD-2 left robot-assisted RUL bleb resection, apical pleurectomy and mechanical pleurodesis. Left lung  well expanded with no air leak. CT to water seal this morning and repeat the CXR at 12noon. If stable, will remove the CT later this afternoon and plan for discharge tomorrow.    LOS: 5 days    Antony Odea, [place] [phone] 05/15/2020   Overall doing well. We will remove chest tube this afternoon. Patient can go home tomorrow morning.  Nicanor Mendolia Bary Leriche

## 2020-05-15 NOTE — Care Management Important Message (Signed)
Important Message  Patient Details  Name: [name] MRN: [id number] Date of Birth: January 19, 1951   Medicare Important Message Given:  Yes     Shelda Altes 05/15/2020, 10:05 AM

## 2020-05-15 NOTE — Progress Notes (Signed)
PROGRESS NOTE  [name] KYH:[id number] DOB: 12/17/1950 DOA: 05/10/2020 PCP: [name], MD  HPI/Recap of past 24 hours: 69 y.o.malewith medical history significant forparoxysmal A. fib on Eliquis, essential hypertension, prostate cancer, prior unspecified CVAwith left-sided weaknesswho presented to [facility] at his PCPs request due to newly found pneumothorax on chest x-ray.  On presentation, CT chest showed large anterior left-sided pneumothorax.  Cardiothoracic surgery/Dr. [name] was consulted who recommended admission to [facility] service.  05/15/20: Seen and examined.  He has no new complaints.  He has a little bit of discomfort at the site of the chest tube that was removed today.  No chest pain.   Assessment/Plan: Active Problems:   Pneumothorax  Spontaneous left sided anterior pneumothorax -Initially presented at his PCPs office with complaints ofproductive cough and chest congestion. Around the same time hadchest discomfort and shortness of breath. Had a chest x-ray done outpatientwhich showed pneumothorax. Was sent to the ED for further evaluation and management. -CT chest noncontrast revealed left-sided anterior pneumothorax. -CT surgery/Dr. [name] following: Underwent VATS with wedge resection of left upper lobe, apical pleurectomy and mechanical pleurodesis on 05/13/2020 by CT surgery.   Left chest tube removed on 05/15/2020 by CTS.   Currently on room air. Closely monitor overnight.  AKI, slowly improving Baseline creatinine appears to be 1.0 with GFR greater than 60 Creatinine downtrending 1.3 from 1.42 Avoid nephrotoxins Monitor urine output Repeat renal panel in the morning.  Paroxysmal A. Fib Currently on Norvasc 10 mg daily. He is on Eliquis for CVA prevention.  Hypertension -Blood pressure currently stable.  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  A1c 7.2.   Continue gabapentin.  Patient refuses to have carb modified diet and hence is on a regular diet.  Hyperlipidemia -Continue home Pravachol  CKD stage II -Creatinine stable.  Monitor  Tobacco use disorder Tobacco cessation counseling at bedside. Patient states he is through with smoking.  Medical noncompliance -Apparently takes Eliquis only once a day. Patient stopped taking Pravachol>2 months ago because he ran out -He also stopped taking any diabetic medications months ago  DVT prophylaxis: Eliquis Code Status: Full Family Communication: None at bedside Disposition Plan:   Consultants: CT surgery  Procedures:  Thoracic surgical procedure on 05/13/2020: - Robotic assistedleftvideo thoracoscopy - Wedge resection of theleft upperlobe - Apical pleurectomy - Mechanical pleurodesis - Intercostal nerve block       Status is: Inpatient  Remains inpatient appropriate because:Inpatient level of care appropriate due to severity of illness  Dispo:  Patient From: Home  Planned Disposition: Home  Expected discharge date: 05/16/20  Medically stable for discharge: No         Objective: Vitals:   05/15/20 0851 05/15/20 1258 05/15/20 1300 05/15/20 1339  BP: 139/87 (!) 145/89    Pulse: 78     Resp: 11 13 18 16   Temp: (!) 97.5 F (36.4 C) (!) 97.3 F (36.3 C)    TempSrc: Oral Oral    SpO2: 95% 95%    Weight:      Height:        Intake/Output Summary (Last 24 hours) at 05/15/2020 1510 Last data filed at 05/15/2020 1305 Gross per 24 hour  Intake 150 ml  Output 340 ml  Net -190 ml   Filed Weights   05/10/20 1452 05/10/20 2149  Weight: 86.6 kg 82.9 kg    Exam:  . General: 69 y.o. year-old male well developed well nourished  in no acute distress.  Alert and oriented x3. . Cardiovascular: Regular rate and rhythm with no rubs or gallops.  No thyromegaly or JVD noted.   Marland Kitchen Respiratory: Clear to auscultation with no wheezes or rales. Good inspiratory  effort. . Abdomen: Soft nontender nondistended with normal bowel sounds x4 quadrants. . Musculoskeletal: No lower extremity edema. 2/4 pulses in all 4 extremities. Marland Kitchen Psychiatry: Mood is appropriate for condition and setting   Data Reviewed: CBC: Recent Labs  Lab 05/10/20 1614 05/10/20 1614 05/11/20 0707 05/12/20 0228 05/13/20 0148 05/14/20 0039 05/15/20 0124  WBC 5.0   < > 4.3 3.9* 4.4 9.7 7.9  NEUTROABS 3.1  --   --   --   --   --   --   HGB 14.2   < > 13.0 12.8* 13.4 14.0 12.9*  HCT 42.6   < > 39.0 38.3* 39.2 41.6 38.6*  MCV 90.6   < > 90.7 89.9 89.3 88.7 89.6  PLT 251   < > 222 237 232 256 226   < > = values in this interval not displayed.   Basic Metabolic Panel: Recent Labs  Lab 05/11/20 0707 05/12/20 0228 05/13/20 0148 05/14/20 0039 05/15/20 0124  NA 138 138 138 135 136  K 3.9 4.0 3.9 4.5 3.9  CL 108 105 107 100 104  CO2 21* 24 23 22 24   GLUCOSE 123* 139* 137* 123* 133*  BUN 14 12 15 19 23   CREATININE 1.17 1.04 1.31* 1.42* 1.39*  CALCIUM 8.5* 8.7* 8.8* 9.1 8.7*  MG  --  2.1  --  2.2 2.0   GFR: Estimated Creatinine Clearance: 57.5 mL/min (A) (by C-G formula based on SCr of 1.39 mg/dL (H)). Liver Function Tests: Recent Labs  Lab 05/10/20 1614 05/15/20 0124  AST 19 47*  ALT 13 41  ALKPHOS 72 48  BILITOT 0.8 1.0  PROT 7.0 6.0*  ALBUMIN 3.6 3.3*   No results for input(s): LIPASE, AMYLASE in the last 168 hours. No results for input(s): AMMONIA in the last 168 hours. Coagulation Profile: Recent Labs  Lab 05/10/20 1614  INR 0.9   Cardiac Enzymes: No results for input(s): CKTOTAL, CKMB, CKMBINDEX, TROPONINI in the last 168 hours. BNP (last 3 results) No results for input(s): PROBNP in the last 8760 hours. HbA1C: No results for input(s): HGBA1C in the last 72 hours. CBG: Recent Labs  Lab 05/14/20 1222 05/14/20 1722 05/14/20 2033 05/15/20 0619 05/15/20 1128  GLUCAP 151* 151* 121* 169* 176*   Lipid Profile: No results for input(s): CHOL,  HDL, LDLCALC, TRIG, CHOLHDL, LDLDIRECT in the last 72 hours. Thyroid Function Tests: No results for input(s): TSH, T4TOTAL, FREET4, T3FREE, THYROIDAB in the last 72 hours. Anemia Panel: No results for input(s): VITAMINB12, FOLATE, FERRITIN, TIBC, IRON, RETICCTPCT in the last 72 hours. Urine analysis:    Component Value Date/Time   COLORURINE YELLOW 05/30/2014 2020   APPEARANCEUR Clear 03/21/2018 1634   LABSPEC 1.023 05/30/2014 2020   PHURINE 5.0 05/30/2014 2020   GLUCOSEU Trace (A) 03/21/2018 1634   HGBUR NEGATIVE 05/30/2014 2020   BILIRUBINUR Negative 03/21/2018 [place] 05/30/2014 2020   PROTEINUR Negative 03/21/2018 1634   PROTEINUR NEGATIVE 05/30/2014 2020   UROBILINOGEN 0.2 12/04/2016 1212   UROBILINOGEN 0.2 05/30/2014 2020   NITRITE Negative 03/21/2018 1634   NITRITE NEGATIVE 05/30/2014 2020   LEUKOCYTESUR Negative 03/21/2018 1634   Sepsis Labs: @LABRCNTIP (procalcitonin:4,lacticidven:4)  ) Recent Results (from the past 240 hour(s))  Respiratory Panel by RT PCR (  Flu A&B, Covid) - Nasopharyngeal Swab     Status: None   Collection Time: 05/10/20  4:40 PM   Specimen: Nasopharyngeal Swab; Nasopharyngeal(NP) swabs in vial transport medium  Result Value Ref Range Status   SARS Coronavirus 2 by RT PCR NEGATIVE NEGATIVE Final    Comment: (NOTE) SARS-CoV-2 target nucleic acids are NOT DETECTED.  The SARS-CoV-2 RNA is generally detectable in upper respiratoy specimens during the acute phase of infection. The lowest concentration of SARS-CoV-2 viral copies this assay can detect is 131 copies/mL. A negative result does not preclude SARS-Cov-2 infection and should not be used as the sole basis for treatment or other patient management decisions. A negative result may occur with  improper specimen collection/handling, submission of specimen other than nasopharyngeal swab, presence of viral mutation(s) within the areas targeted by this assay, and inadequate number  of viral copies (<131 copies/mL). A negative result must be combined with clinical observations, patient history, and epidemiological information. The expected result is Negative.  Fact Sheet for Patients:  PinkCheek.be  Fact Sheet for Healthcare Providers:  GravelBags.it  This test is no t yet approved or cleared by the [place] FDA and  has been authorized for detection and/or diagnosis of SARS-CoV-2 by FDA under an Emergency Use Authorization (EUA). This EUA will remain  in effect (meaning this test can be used) for the duration of the COVID-19 declaration under Section 564(b)(1) of the Act, 21 U.S.C. section 360bbb-3(b)(1), unless the authorization is terminated or revoked sooner.     Influenza A by PCR NEGATIVE NEGATIVE Final   Influenza B by PCR NEGATIVE NEGATIVE Final    Comment: (NOTE) The Xpert Xpress SARS-CoV-2/FLU/RSV assay is intended as an aid in  the diagnosis of influenza from Nasopharyngeal swab specimens and  should not be used as a sole basis for treatment. Nasal washings and  aspirates are unacceptable for Xpert Xpress SARS-CoV-2/FLU/RSV  testing.  Fact Sheet for Patients: PinkCheek.be  Fact Sheet for Healthcare Providers: GravelBags.it  This test is not yet approved or cleared by the [place] FDA and  has been authorized for detection and/or diagnosis of SARS-CoV-2 by  FDA under an Emergency Use Authorization (EUA). This EUA will remain  in effect (meaning this test can be used) for the duration of the  Covid-19 declaration under Section 564(b)(1) of the Act, 21  U.S.C. section 360bbb-3(b)(1), unless the authorization is  terminated or revoked. Performed at [facility], [place] [address], [place], [place] [address]   Surgical pcr screen     Status: None   Collection Time: 05/12/20  6:37 PM   Specimen: Nasal Mucosa; Nasal  Swab  Result Value Ref Range Status   MRSA, PCR NEGATIVE NEGATIVE Final   Staphylococcus aureus NEGATIVE NEGATIVE Final    Comment: (NOTE) The Xpert SA Assay (FDA approved for NASAL specimens in patients 51 years of age and older), is one component of a comprehensive surveillance program. It is not intended to diagnose infection nor to guide or monitor treatment. Performed at [facility], [place] [address] [place], [place] [address]       Studies: DG CHEST PORT 1 VIEW  Result Date: 05/15/2020 CLINICAL DATA:  Pneumothorax. EXAM: PORTABLE CHEST 1 VIEW COMPARISON:  May 15, 2020. FINDINGS: The heart size and mediastinal contours are within normal limits. Both lungs are clear. Left-sided chest tube is unchanged in position. No pneumothorax is noted. The visualized skeletal structures are unremarkable. IMPRESSION: Left-sided chest tube is unchanged in  position. No pneumothorax is noted. Electronically Signed   By: Marijo Conception M.D.   On: 05/15/2020 12:35   DG CHEST PORT 1 VIEW  Result Date: 05/15/2020 CLINICAL DATA:  Chest tube in place.  Chest soreness. EXAM: PORTABLE CHEST 1 VIEW COMPARISON:  One-view chest x-ray 05/14/2020 FINDINGS: Heart size is normal, somewhat exaggerated by low lung volumes. Left-sided chest tube is in place. Postoperative changes are again noted in the left upper lobe. No significant pneumothorax is present. Mild dependent atelectasis is present at both bases. Lungs are otherwise clear. IMPRESSION: 1. Stable left-sided chest tube without significant pneumothorax. 2. Postoperative changes in the left upper lobe. 3. Mild dependent atelectasis. Electronically Signed   By: San Morelle M.D.   On: 05/15/2020 07:52    Scheduled Meds: . acetaminophen  1,000 mg Oral Q6H   Or  . acetaminophen (TYLENOL) oral liquid 160 mg/5 mL  1,000 mg Oral Q6H  . amLODipine  10 mg Oral Daily  . apixaban  5 mg Oral BID  . B-complex with vitamin C  1 tablet Oral Daily   . bisacodyl  10 mg Oral Daily  . gabapentin  100 mg Oral Q1200  . insulin aspart  0-5 Units Subcutaneous QHS  . insulin aspart  0-9 Units Subcutaneous TID WC  . ketorolac  15 mg Intravenous Q6H  . multivitamin with minerals  1 tablet Oral Daily  . pravastatin  10 mg Oral Daily  . senna-docusate  1 tablet Oral QHS    Continuous Infusions:   LOS: 5 days     Kayleen Memos, MD [facility] Pager [phone]  If 7PM-7AM, please contact night-coverage [email or web address] Password [email or web address] 05/15/2020, 3:10 PM

## 2020-05-15 NOTE — Progress Notes (Signed)
Chest tube removed per MD order without difficulty.  Will continue to monitor.

## 2020-05-15 NOTE — Progress Notes (Signed)
Pt walked hall independently this AM, approx 470 ft on room air. Tolerated well.

## 2020-05-16 ENCOUNTER — Inpatient Hospital Stay (HOSPITAL_COMMUNITY): Payer: Medicare HMO

## 2020-05-16 DIAGNOSIS — J9311 Primary spontaneous pneumothorax: Secondary | ICD-10-CM | POA: Diagnosis not present

## 2020-05-16 LAB — GLUCOSE, CAPILLARY: Glucose-Capillary: 192 mg/dL — ABNORMAL HIGH (ref 70–99)

## 2020-05-16 MED ORDER — APIXABAN 5 MG PO TABS: 5.0000 mg | ORAL_TABLET | Freq: Two times a day (BID) | ORAL | 0 refills | Status: DC

## 2020-05-16 MED ORDER — GABAPENTIN 100 MG PO CAPS: 100.0000 mg | ORAL_CAPSULE | Freq: Every day | ORAL | 0 refills | Status: DC

## 2020-05-16 MED ORDER — PRAVASTATIN SODIUM 10 MG PO TABS: 10.0000 mg | ORAL_TABLET | Freq: Every day | ORAL | 0 refills | Status: DC

## 2020-05-16 MED ORDER — AMLODIPINE BESYLATE 10 MG PO TABS: 10.0000 mg | ORAL_TABLET | Freq: Every day | ORAL | 0 refills | Status: DC

## 2020-05-16 MED ORDER — METFORMIN HCL 500 MG PO TABS: 500.0000 mg | ORAL_TABLET | Freq: Two times a day (BID) | ORAL | 0 refills | Status: DC

## 2020-05-16 NOTE — Progress Notes (Signed)
Pt discharged home with wife. IV and telemetry box removed. Pt received discharge instructions and all questions answered. Pt left with all of his belongings. Pt left via wheelchair and was accompanied by staff.   [place]

## 2020-05-16 NOTE — Discharge Summary (Signed)
Discharge Summary  [name]:[id number] DOB: [birth year]  PCP: [name], MD  Admit date: 05/10/2020 Discharge date: 05/16/2020  Time spent: 35 minutes  Recommendations for Outpatient Follow-up:  1. Follow-up with cardiothoracic surgery on 05/24/2020, appointment time is at 4:15 PM. 2. Follow-up with cardiology in 1 to 2 weeks 3. Follow-up with your primary care provider in 1 to 2 weeks 4. Thank you medications as prescribed 5. Completely abstain from tobacco use.  Discharge Diagnoses:  Active Hospital Problems   Diagnosis Date Noted  . Pneumothorax 05/10/2020    Resolved Hospital Problems  No resolved problems to display.    Discharge Condition: Stable  Diet recommendation: Heart healthy, carb modified diet.  Vitals:   05/16/20 0405 05/16/20 0743  BP: (!) 159/82 (!) 150/85  Pulse: 82 81  Resp: 18 20  Temp: 98.4 F (36.9 C) 98.1 F (36.7 C)  SpO2: 97% 98%    History of present illness:   69 y.o.malewith medical history significant forparoxysmal A. fib on Eliquis, essential hypertension, prostate cancer, prior unspecified CVAwith left-sided weaknesswho presented [facility]Ps [facility] due to newly found pneumothorax on chest x-ray. On presentation, CT chest showed large anterior left-sided pneumothorax.  Cardiothoracic surgery/Dr. [name] was consulted.   3 Days Post-Op Procedure(s) (LRB): XI ROBOTIC ASSISTED THORASCOPY-WEDGE RESECTION apical pleurectomy mechanical pleurodesis (Left) INTERCOSTAL NERVE BLOCK (Left).  Chest tube removed on 05/15/20.    05/16/20:  No acute events overnight. He has no new complaints. O2 saturation 98% on room air.   Repeated chest x-ray on 05/16/2020 showing:  Interval removal left chest tube. No definite pneumothorax.  Bibasilar atelectasis.   Hospital Course:  Active Problems:   Pneumothorax  Spontaneous large left sided anterior pneumothorax -Initially presented at his PCPs office with  complaints ofproductive cough and chest congestion. Around the same time hadchest discomfort and shortness of breath. Had a chest x-ray done outpatientwhich showed pneumothorax. Was sent to the ED for further evaluation and management. -CT chest noncontrast revealed large left-sided anterior pneumothorax. -CT surgery/Dr. [name] consulted:Underwent VATS with wedge resection of left upper lobe, apical pleurectomy and mechanical pleurodesis on 05/13/2020 by cardiothoracic surgery.  Left chest tube removed on 05/15/2020 by cardiothoracic surgery.   Currently on room air with O2 saturation 98%. Follow-up with cardiothoracic surgery on 05/24/2020.  AKI, slowly improving Baseline creatinine appears to be 1.0 with GFR greater than 60 Creatinine downtrending 1.3 from 1.42 Continue to avoid nephrotoxins Follow-up with your PCP in 1 to 2 weeks  Paroxysmal A. Fib Currently in sinus rhythm, on Norvasc 10 mg daily. He is on Eliquis for CVA prevention. 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 Continue gabapentin.  -Start Metformin 500 mg twice daily -Follow with your PCP  Hyperlipidemia -Continue home Pravachol  Tobacco use disorder Tobacco cessation counseling done at bedside. Patient states he is through with smoking. Follow-up with your PCP  Medical noncompliance -Please take your medications as prescribed.  DVT prophylaxis:Eliquis Code Status:Full    Consultants:Cardiothoracic surgery  Procedures: Thoracic surgical procedure on 05/13/2020: - Robotic assistedleftvideo thoracoscopy - Wedge resection of theleft upperlobe - Apical pleurectomy - Mechanical pleurodesis - Intercostal nerve block    Discharge Exam: BP (!) 150/85 (BP Location: Left Arm)    Pulse 81   Temp 98.1 F (36.7 C) (Oral)   Resp  20   Ht 6' 1" (1.854 m)   Wt 82.9 kg   SpO2 98%   BMI 24.10 kg/m  . General: 69 y.o. year-old male well developed well nourished in no acute distress.  Alert and oriented x3. . Cardiovascular: Regular rate and rhythm with no rubs or gallops.  No thyromegaly or JVD noted.   Marland Kitchen Respiratory: Clear to auscultation with no wheezes or rales. Good inspiratory effort. . Abdomen: Soft nontender nondistended with normal bowel sounds x4 quadrants. . Musculoskeletal: No lower extremity edema bilaterally.  Marland Kitchen Psychiatry: Mood is appropriate for condition and setting  Discharge Instructions You were cared for by a hospitalist during your hospital stay. If you have any questions about your discharge medications or the care you received while you were in the hospital after you are discharged, you can call the unit and asked to speak with the hospitalist on call if the hospitalist that took care of you is not available. Once you are discharged, your primary care physician will handle any further medical issues. Please note that NO REFILLS for any discharge medications will be authorized once you are discharged, as it is imperative that you return to your primary care physician (or establish a relationship with a primary care physician if you do not have one) for your aftercare needs so that they can reassess your need for medications and monitor your lab values.   Allergies as of 05/16/2020      Reactions   Lisinopril Swelling   Throat swelling      Medication List    TAKE these medications   amLODipine 10 MG tablet Commonly known as: NORVASC Take 1 tablet (10 mg total) by mouth daily.   apixaban 5 MG Tabs tablet Commonly known as: Eliquis Take 1 tablet (5 mg total) by mouth 2 (two) times daily.   bisacodyl 5 MG EC tablet Commonly known as: DULCOLAX Take 5 mg by mouth daily as needed (constipation).   fluticasone 50 MCG/ACT nasal spray Commonly  known as: FLONASE Place 1 spray into both nostrils daily as needed for allergies or rhinitis.   gabapentin 100 MG capsule Commonly known as: NEURONTIN Take 1 capsule (100 mg total) by mouth daily at 12 noon. What changed:   when to take this  additional instructions   GlucoCom Lancets Misc 100 Units by Does not apply route once.   glucose blood test strip Commonly known as: True Metrix Blood Glucose Test Use as instructed   HYDROcodone-acetaminophen 10-325 MG tablet Commonly known as: NORCO Take 1 tablet by mouth daily as needed (pain).   hydrOXYzine 25 MG tablet Commonly known as: ATARAX/VISTARIL TAKE 1 TABLET (25 MG TOTAL) BY MOUTH 2 (TWO) TIMES DAILY AS NEEDED. What changed: reasons to take this   metFORMIN 500 MG tablet Commonly known as: Glucophage Take 1 tablet (500 mg total) by mouth 2 (two) times daily with a meal.   multivitamin with minerals Tabs tablet Take 1 tablet by mouth daily.   pravastatin 10 MG tablet Commonly known as: PRAVACHOL Take 1 tablet (10 mg total) by mouth daily.   True Metrix Meter w/Device Kit Use as directed   TUSSIN EXPECTORANT PO Take 20 mLs by mouth every 6 (six) hours as needed (congestion).   Vitamin B Complex Tabs Take 1 tablet by mouth daily.   Voltaren 1 % Gel Generic drug: diclofenac Sodium Apply 1 application topically 3 (three) times daily as needed (pain).      Allergies  Allergen Reactions  .  Lisinopril Swelling    Throat swelling    Follow-up Information    Lajuana Matte, MD. Go on 05/24/2020.   Specialty: Cardiothoracic Surgery Why: Appointment time is at 4:15 pm Contact information: [address] [place] [address] [phone]        [name], MD. Call in 1 day(s).   Specialty: Internal Medicine Why: call for a post hospital follow up appointment Contact information: [place] [address] [phone]        Liliane Shi, PA-C. Call in 1  day(s).   Specialties: Cardiology, Physician Assistant Why: call for a post hospital follow up appointment Contact information: [address] [place] [address] [phone]                The results of significant diagnostics from this hospitalization (including imaging, microbiology, ancillary and laboratory) are listed below for reference.    Significant Diagnostic Studies: DG Chest 2 View  Result Date: 05/16/2020 CLINICAL DATA:  History of pneumothorax. EXAM: CHEST - 2 VIEW COMPARISON:  Chest radiograph 05/15/2020. FINDINGS: Monitoring leads overlie the patient. Stable cardiac and mediastinal contours. Minimal heterogeneous opacities right greater than left lung bases may represent atelectasis. Interval removal left chest tube. No definite pneumothorax. Thoracic spine degenerative changes. IMPRESSION: Interval removal left chest tube. No definite pneumothorax. Bibasilar atelectasis. Electronically Signed   By: Lovey Newcomer M.D.   On: 05/16/2020 08:45   DG Chest 2 View  Addendum Date: 05/10/2020   ADDENDUM REPORT: 05/10/2020 14:32 ADDENDUM: Critical Value/emergent results were called by telephone at the time of interpretation on 05/10/2020 at 2:31 pm to provider CMA Rutherford Nail, who verbally acknowledged these results. She was informed of the patient's transfer to the emergency department. Electronically Signed   By: Richardean Sale M.D.   On: 05/10/2020 14:32   Result Date: 05/10/2020 CLINICAL DATA:  Left-sided chest pain with cough and shortness of breath for 2 weeks. No acute injury. EXAM: CHEST - 2 VIEW COMPARISON:  Chest radiographs 06/09/2014. FINDINGS: The heart size and mediastinal contours are stable without evidence of mediastinal shift. There is a new sizable left-sided pneumothorax estimated at approximately 40%. There is partial collapse of the left lung. The right lung is clear. There is no mediastinal shift or significant pleural effusion. No evidence  of acute fracture. Mild thoracic spondylosis noted. IMPRESSION: New sizable left-sided pneumothorax estimated at approximately 40% with partial collapse of the left lung but no mediastinal shift. While attempting to reach the ordering provider with these results, this outpatient was taken to the emergency department at [facility]. Electronically Signed: By: Richardean Sale M.D. On: 05/10/2020 14:15   DG Wrist Complete Right  Result Date: 05/10/2020 CLINICAL DATA:  Ongoing wrist pain for years. EXAM: RIGHT WRIST - COMPLETE 3+ VIEW COMPARISON:  Radiographs 09/04/2017. 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 No evidence of developing avascular necrosis. There is a stable chronic fracture of the 5th metacarpal neck. Mild radiocarpal and intercarpal degenerative changes are stable. No evidence of acute fracture or dislocation. 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 Chronic fracture of the 5th metacarpal neck. No acute osseous findings. Electronically Signed   By: Richardean Sale M.D.   On: 05/10/2020 14:10   CT Chest Wo Contrast  Result Date: 05/10/2020 CLINICAL DATA:  Pneumothorax revealed on plain. EXAM: CT CHEST  WITHOUT CONTRAST TECHNIQUE: Multidetector CT imaging of the chest was performed following the standard protocol without IV contrast. COMPARISON:  None. FINDINGS: Cardiovascular: No significant vascular findings. Normal heart size. No pericardial effusion. Mediastinum/Nodes: No enlarged mediastinal or axillary lymph nodes. Thyroid gland, trachea, and esophagus demonstrate no significant findings. Lungs/Pleura: Mild emphysematous lung disease and bullous changes are seen involving predominantly the bilateral upper lobes. Moderate severity scarring and/or atelectasis is seen within the left lower lobe and posterior aspect of the left upper  lobe. A 5 mm noncalcified lung nodule is seen within the lateral aspect of the right upper lobe (axial CT image 49, CT series number 4). A 6 mm noncalcified lung nodule is noted within the anterior aspect of the right middle lobe (axial CT image 100, CT series number 4). A 6 mm noncalcified lung nodule is seen within the posterior aspect of the left upper lobe (axial CT image 75, CT series number 4). A large anterior left-sided pneumothorax is seen. This measures approximately 10.2 cm in maximum AP measurement and extends from the left apex of the left lung base. A small amount of pleural fluid is seen on the left. Upper Abdomen: There is a small hiatal hernia. Musculoskeletal: Degenerative changes seen throughout the thoracic spine. IMPRESSION: 1. Large anterior left-sided pneumothorax. 2. Moderate severity left lower lobe and posterior left upper lobe scarring and/or atelectasis with bilateral subcentimeter noncalcified lung nodules. Correlation with three-month follow-up chest CT is recommended to determine stability, as an underlying neoplastic process cannot be excluded. 3. Small left pleural effusion. 4. Mild emphysematous lung disease and bullous changes are seen involving predominantly the bilateral upper lobes. 5. Small hiatal hernia. 6. Emphysema. Emphysema (ICD10-J43.9). Electronically Signed   By: Virgina Norfolk M.D.   On: 05/10/2020 19:39   DG CHEST PORT 1 VIEW  Result Date: 05/15/2020 CLINICAL DATA:  Pneumothorax. EXAM: PORTABLE CHEST 1 VIEW COMPARISON:  May 15, 2020. FINDINGS: The heart size and mediastinal contours are within normal limits. Both lungs are clear. Left-sided chest tube is unchanged in position. No pneumothorax is noted. The visualized skeletal structures are unremarkable. IMPRESSION: Left-sided chest tube is unchanged in position. No pneumothorax is noted. Electronically Signed   By: Marijo Conception M.D.   On: 05/15/2020 12:35   DG CHEST PORT 1 VIEW  Result Date:  05/15/2020 CLINICAL DATA:  Chest tube in place.  Chest soreness. EXAM: PORTABLE CHEST 1 VIEW COMPARISON:  One-view chest x-ray 05/14/2020 FINDINGS: Heart size is normal, somewhat exaggerated by low lung volumes. Left-sided chest tube is in place. Postoperative changes are again noted in the left upper lobe. No significant pneumothorax is present. Mild dependent atelectasis is present at both bases. Lungs are otherwise clear. IMPRESSION: 1. Stable left-sided chest tube without significant pneumothorax. 2. Postoperative changes in the left upper lobe. 3. Mild dependent atelectasis. Electronically Signed   By: San Morelle M.D.   On: 05/15/2020 07:52   DG CHEST PORT 1 VIEW  Result Date: 05/14/2020 CLINICAL DATA:  Left chest tube. EXAM: PORTABLE CHEST 1 VIEW COMPARISON:  05/13/2020.  05/11/2020. FINDINGS: Mediastinum and hilar structures normal. Stable cardiomegaly. Postsurgical changes left upper lung again noted. Left chest tube in stable position. Interval resolution of large left pneumothorax noted on prior chest x-ray of 05/11/2020. Chest appears stable from prior study of 05/13/2020. No pleural effusion. No acute bony abnormality. IMPRESSION: Postsurgical changes left upper lung again noted. Left chest tube in stable position. Interval resolution of large left pneumothorax noted on prior chest  x-ray of 05/11/2020. Chest appears stable from prior study of 11 08/12/2019. Electronically Signed   By: Marcello Moores  Register   On: 05/14/2020 06:25   DG Chest Port 1 View  Result Date: 05/13/2020 CLINICAL DATA:  Status post apical pleurectomy with pleurodesis EXAM: PORTABLE CHEST 1 VIEW COMPARISON:  05/11/2020 FINDINGS: Cardiac shadow is stable. Previously seen left-sided pneumothorax has resolved following chest tube placement. Postsurgical changes in the apex are seen. Right lung remains well aerated. No acute bony abnormality is noted. IMPRESSION: Chest tube in place on the left with resolution of  left-sided pneumothorax. No acute abnormality noted. Electronically Signed   By: Inez Catalina M.D.   On: 05/13/2020 13:34   DG CHEST PORT 1 VIEW  Result Date: 05/11/2020 CLINICAL DATA:  Evaluate left-sided pneumothorax. EXAM: PORTABLE CHEST 1 VIEW COMPARISON:  Chest x-ray May 10, 2020. Chest CT May 10, 2020. FINDINGS: The large left-sided pneumothorax is essentially stable. No right-sided pneumothorax. The right lung is clear. Mild opacity in the partially collapsed left lung was better appreciated on recent CT imaging, possibly scar atelectasis. Stable cardiomegaly. The hila and mediastinum are unchanged. No other acute abnormalities. IMPRESSION: 1. A large left-sided pneumothorax remains, not significantly changed. No shift of the heart or mediastinum to the right to suggest tension. 2. No other changes. Persistent opacity in the partially collapsed left lung. Electronically Signed   By: Dorise Bullion III M.D   On: 05/11/2020 09:52    Microbiology: Recent Results (from the past 240 hour(s))  Respiratory Panel by RT PCR (Flu A&B, Covid) - Nasopharyngeal Swab     Status: None   Collection Time: 05/10/20  4:40 PM   Specimen: Nasopharyngeal Swab; Nasopharyngeal(NP) swabs in vial transport medium  Result Value Ref Range Status   SARS Coronavirus 2 by RT PCR NEGATIVE NEGATIVE Final    Comment: (NOTE) SARS-CoV-2 target nucleic acids are NOT DETECTED.  The SARS-CoV-2 RNA is generally detectable in upper respiratoy specimens during the acute phase of infection. The lowest concentration of SARS-CoV-2 viral copies this assay can detect is 131 copies/mL. A negative result does not preclude SARS-Cov-2 infection and should not be used as the sole basis for treatment or other patient management decisions. A negative result may occur with  improper specimen collection/handling, submission of specimen other than nasopharyngeal swab, presence of viral mutation(s) within the areas targeted by  this assay, and inadequate number of viral copies (<131 copies/mL). A negative result must be combined with clinical observations, patient history, and epidemiological information. The expected result is Negative.  Fact Sheet for Patients:  PinkCheek.be  Fact Sheet for Healthcare Providers:  GravelBags.it  This test is no t yet approved or cleared by the [place] FDA and  has been authorized for detection and/or diagnosis of SARS-CoV-2 by FDA under an Emergency Use Authorization (EUA). This EUA will remain  in effect (meaning this test can be used) for the duration of the COVID-19 declaration under Section 564(b)(1) of the Act, 21 U.S.C. section 360bbb-3(b)(1), unless the authorization is terminated or revoked sooner.     Influenza A by PCR NEGATIVE NEGATIVE Final   Influenza B by PCR NEGATIVE NEGATIVE Final    Comment: (NOTE) The Xpert Xpress SARS-CoV-2/FLU/RSV assay is intended as an aid in  the diagnosis of influenza from Nasopharyngeal swab specimens and  should not be used as a sole basis for treatment. Nasal washings and  aspirates are unacceptable for Xpert Xpress SARS-CoV-2/FLU/RSV  testing.  Fact Sheet for Patients: PinkCheek.be  Fact Sheet for Healthcare Providers: GravelBags.it  This test is not yet approved or cleared by the [place] FDA and  has been authorized for detection and/or diagnosis of SARS-CoV-2 by  FDA under an Emergency Use Authorization (EUA). This EUA will remain  in effect (meaning this test can be used) for the duration of the  Covid-19 declaration under Section 564(b)(1) of the Act, 21  U.S.C. section 360bbb-3(b)(1), unless the authorization is  terminated or revoked. Performed at [facility], [place] [address] [place], [place] [address]   Surgical pcr screen     Status: None   Collection Time: 05/12/20  6:37 PM    Specimen: Nasal Mucosa; Nasal Swab  Result Value Ref Range Status   MRSA, PCR NEGATIVE NEGATIVE Final   Staphylococcus aureus NEGATIVE NEGATIVE Final    Comment: (NOTE) The Xpert SA Assay (FDA approved for NASAL specimens in patients 19 years of age and older), is one component of a comprehensive surveillance program. It is not intended to diagnose infection nor to guide or monitor treatment. Performed at [facility], [place] [address] [place], [place] [address]      Labs: Basic Metabolic Panel: Recent Labs  Lab 05/11/20 0707 05/12/20 0228 05/13/20 0148 05/14/20 0039 05/15/20 0124  NA 138 138 138 135 136  K 3.9 4.0 3.9 4.5 3.9  CL 108 105 107 100 104  CO2 21* _0 GLUCOSE 123* 139* 137* 123* 133*  BUN _1 CREATININE 1.17 1.04 1.31* 1.42* 1.39*  CALCIUM 8.5* 8.7* 8.8* 9.1 8.7*  MG  --  2.1  --  2.2 2.0   Liver Function Tests: Recent Labs  Lab 05/10/20 1614 05/15/20 0124  AST 19 47*  ALT 13 41  ALKPHOS 72 48  BILITOT 0.8 1.0  PROT 7.0 6.0*  ALBUMIN 3.6 3.3*   No results for input(s): LIPASE, AMYLASE in the last 168 hours. No results for input(s): AMMONIA in the last 168 hours. CBC: Recent Labs  Lab 05/10/20 1614 05/10/20 1614 05/11/20 0707 05/12/20 0228 05/13/20 0148 05/14/20 0039 05/15/20 0124  WBC 5.0   < > 4.3 3.9* 4.4 9.7 7.9  NEUTROABS 3.1  --   --   --   --   --   --   HGB 14.2   < > 13.0 12.8* 13.4 14.0 12.9*  HCT 42.6   < > 39.0 38.3* 39.2 41.6 38.6*  MCV 90.6   < > 90.7 89.9 89.3 88.7 89.6  PLT 251   < > 222 237 232 256 226   < > = values in this interval not displayed.   Cardiac Enzymes: No results for input(s): CKTOTAL, CKMB, CKMBINDEX, TROPONINI in the last 168 hours. BNP: BNP (last 3 results) No results for input(s): BNP in the last 8760 hours.  ProBNP (last 3 results) No results for input(s): PROBNP in the last 8760 hours.  CBG: Recent Labs  Lab 05/15/20 0619 05/15/20 1128 05/15/20 1632 05/15/20 2133  05/16/20 0541  GLUCAP 169* 176* 201* 182* 192*       Signed:  Kayleen Memos, MD [facility] 05/16/2020, 10:58 AM

## 2020-05-20 ENCOUNTER — Telehealth: Payer: Self-pay

## 2020-05-20 NOTE — Telephone Encounter (Signed)
Transition Care Management Follow-up Telephone Call  Date of discharge and from where: 05/16/2020, [facility]  How have you been since you were released from the hospital? He stated that he is recuperating.   Any questions or concerns? No  Items Reviewed:  Did the pt receive and understand the discharge instructions provided? Yes   Medications obtained and verified? he said he has the metformin but needs to check his medication list for what needs to be refilled.  No questions about his med regime at this time,   Other? No   Any new allergies since your discharge? No   Do you have support at home? Yes   Home Care and Equipment/Supplies: Were home health services ordered? no If so, what is the name of the agency? n/a Has the agency set up a time to come to the patient's home?n/a Were any new equipment or medical supplies ordered?  No What is the name of the medical supply agency? n/a Were you able to get the supplies/equipment?n/a Do you have any questions related to the use of the equipment or supplies? No   He already has a walker and tubseat. Has glucometer and said that he checks his blood sugar every other day. Did not check it today  Functional Questionnaire: (I = Independent and D = Dependent) ADLs:independent  Follow up appointments reviewed:   PCP Hospital f/u appt confirmed? rescheduled appoointment with Dr [name] for 05/27/2020   Specialist Hospital f/u appt confirmed? Yes , Cardiothoracic Surgery - 05/24/2020  Are transportation arrangements needed? he has been arranging rides. informed him that he can contact [employer] to enroll with their transportation services  If their condition worsens, is the pt aware to call PCP or go to the Emergency Dept.? yes  Was the patient provided with contact information for the PCP's office or ED? He has the phone number for the clinic  Was to pt encouraged to call back with questions or concerns? yes

## 2020-05-24 ENCOUNTER — Ambulatory Visit (INDEPENDENT_AMBULATORY_CARE_PROVIDER_SITE_OTHER): Payer: Self-pay | Admitting: Thoracic Surgery (Cardiothoracic Vascular Surgery)

## 2020-05-24 ENCOUNTER — Other Ambulatory Visit: Payer: Self-pay

## 2020-05-24 ENCOUNTER — Encounter: Payer: Self-pay | Admitting: Thoracic Surgery (Cardiothoracic Vascular Surgery)

## 2020-05-24 VITALS — BP 149/82 | HR 77 | Temp 97.2°F | Resp 18 | Ht 73.0 in | Wt 189.0 lb

## 2020-05-24 DIAGNOSIS — Z9889 Other specified postprocedural states: Secondary | ICD-10-CM

## 2020-05-24 NOTE — Progress Notes (Signed)
      [address]       [place] [address]             [phone]        Trace C Hecker [place] Medical Record #[id number] Date of Birth: 1951/01/19  Referring: Aline August, MD Primary Care: [name], MD Primary Cardiologist:No primary care provider on file.  Reason for visit:   follow-up  History of Present Illness:     Mr. Sopp comes in for his 1 week appointment. He has no complaints. He does notice some mild incisional pain. His respiratory status is much improved Physical Exam: BP (!) 149/82 (BP Location: Right Arm, Patient Position: Sitting)   Pulse 77   Temp (!) 97.2 F (36.2 C)   Resp 18   Ht 6\' 1"  (1.854 m)   Wt 189 lb (85.7 kg)   SpO2 96% Comment: RA with mask on  BMI 24.94 kg/m   Alert NAD Incision clean stitch removed.   Abdomen soft, ND No peripheral edema   Diagnostic Studies & Laboratory data:  Path: FINAL MICROSCOPIC DIAGNOSIS:   A. LUNG, BLEB, RIGHT UPPER LOBE:  - Lung wedge biopsy with pleural bleb, 2.5 cm   B. LUNG, BLEB, RIGHT UPPER LOBE #2:  - Lung wedge biopsy with pleural blebs, largest measuring 1.5 cm   C. PLEURA, APICAL:  - Pleura with mild chronic inflammation     Assessment / Plan:   69 year old male status post robotic assisted apical pleurectomy, wedge resection, and mechanical pleurodesis  Overall doing well. Will return in 1 month with a follow-up chest x-ray.   Lajuana Matte 05/24/2020 5:14 PM

## 2020-05-27 ENCOUNTER — Ambulatory Visit: Payer: Medicare HMO | Attending: Internal Medicine | Admitting: Internal Medicine

## 2020-05-27 ENCOUNTER — Other Ambulatory Visit: Payer: Self-pay

## 2020-05-27 ENCOUNTER — Ambulatory Visit (HOSPITAL_COMMUNITY)
Admission: RE | Admit: 2020-05-27 | Discharge: 2020-05-27 | Disposition: A | Discharge status: Home / Self Care | Payer: Medicare HMO | Source: Ambulatory Visit | Attending: Internal Medicine | Admitting: Internal Medicine

## 2020-05-27 ENCOUNTER — Encounter: Payer: Self-pay | Admitting: Internal Medicine

## 2020-05-27 VITALS — BP 132/79 | HR 86 | Temp 98.2°F | Resp 16 | Wt 189.4 lb

## 2020-05-27 DIAGNOSIS — J939 Pneumothorax, unspecified: Secondary | ICD-10-CM | POA: Diagnosis not present

## 2020-05-27 DIAGNOSIS — Z8709 Personal history of other diseases of the respiratory system: Secondary | ICD-10-CM | POA: Insufficient documentation

## 2020-05-27 DIAGNOSIS — N289 Disorder of kidney and ureter, unspecified: Secondary | ICD-10-CM | POA: Diagnosis not present

## 2020-05-27 DIAGNOSIS — F17200 Nicotine dependence, unspecified, uncomplicated: Secondary | ICD-10-CM

## 2020-05-27 DIAGNOSIS — R918 Other nonspecific abnormal finding of lung field: Secondary | ICD-10-CM | POA: Diagnosis not present

## 2020-05-27 DIAGNOSIS — Z09 Encounter for follow-up examination after completed treatment for conditions other than malignant neoplasm: Secondary | ICD-10-CM

## 2020-05-27 DIAGNOSIS — E1149 Type 2 diabetes mellitus with other diabetic neurological complication: Secondary | ICD-10-CM

## 2020-05-27 MED ORDER — NICOTINE 7 MG/24HR TD PT24: 7.0000 mg | MEDICATED_PATCH | Freq: Every day | TRANSDERMAL | 1 refills | Status: DC

## 2020-05-27 MED FILL — AMLODIPINE BESYLATE 10 MG T: 10 | 90 days supply | Qty: 90 | Fill #1

## 2020-05-27 NOTE — Progress Notes (Signed)
Patient ID: [name], male    DOB: April 20, 1951  MRN: [id number]  CC: Transition of Care Patient hospitalized 11/19-25/2021   Subjective: [name] is a 69 y.o. male who presents for transition of care His concerns today include:  Pt with hx of HTN, DM,CKD 2,CVA with Lt sided weakness 04/2014, a.fib on DOAG, HL,prostate CA.cocaine abusein remission since 2005.   Patient hospitalized with spontaneous large left-sided anterior pneumothorax.  He was seen by CT surgeon Dr. [name] and underwent VATS with wedge resection of the left upper lobe, apical pleurectomy and mechanical pleurodesis on 05/13/2020.  Chest tube was removed.  He had AKA that was improving by the time of discharge.  Baseline creatinine is 1.  During hospitalization it was 1.4 and by the time of discharge it was 1.3.  His A1c was 7.2.  He was started on Metformin 500 mg twice a day.  Today: Since hospitalization, he has seen Dr. [name] in follow-up last week.  He was deemed to be stable.  Plan was for him to follow-up again in 1 month and have repeat chest x-ray done at that time.  He tells me that he is breathing better than prior to surgery but he still feels some pain in the chest depending on what side he lays on at nights.  He also feels that he is not able to take as deep of breath on the left side as before but denies shortness of breath with ambulation. -He was smoking about 2 cigarettes a day prior to hospitalization.  Since hospitalization he states he has been smoking the same 1 cigarette for the past 4 days.  He feels that he should quit and wants to quit.  DM: He is taking the Metformin twice a day.  He checks his blood sugars about every 3 days.  He states that his range has stayed a little above or below 100 with the range being about 100.  Patient Active Problem List   Diagnosis Date Noted  . Pneumothorax 05/10/2020  . Blood clotting disorder ([place]) 04/23/2020  . Stressful life event  affecting family 01/29/2020  . Hyperlipidemia associated with type 2 diabetes mellitus ([place]) 01/29/2020  . Personal history of fall 01/29/2020  . 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 Unintended weight loss 01/29/2020  . Pain due to onychomycosis of toenails of both feet 08/01/2019  . Porokeratosis 08/01/2019  . Influenza vaccine needed 02/24/2019  . Diabetes mellitus ([place]) 02/24/2019  . Malignant neoplasm of prostate ([facility] [facility]) 09/23/2018  . Hyperlipemia 11/24/2016  . History of CVA with residual deficit 11/24/2016  . GERD (gastroesophageal reflux disease) 08/09/2014  . Atrial fibrillation ([place])   . Hemiparesis affecting left side as late effect of stroke ([facility]) 05/01/2014  . 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 DEGENERATIVE JOINT DISEASE, CERVICAL SPINE 04/24/2009  . DM2 (diabetes mellitus, type 2) ([facility]) 07/03/2006  . Former smoker 07/03/2006  . HIATAL HERNIA WITH REFLUX 07/03/2006  . Essential hypertension 06/08/2006     Current Outpatient Medications on File Prior to Visit  Medication Sig Dispense Refill  . amLODipine (NORVASC) 10 MG tablet Take 1 tablet (10 mg total) by mouth daily. 90 tablet 0  . apixaban (ELIQUIS) 5 MG TABS tablet Take 1 tablet (5 mg total) by mouth 2 (two) times daily. 180 tablet 0  . B Complex Vitamins (VITAMIN B COMPLEX) TABS Take 1 tablet by mouth daily.    . bisacodyl (DULCOLAX) 5 MG EC tablet Take 5 mg by  mouth daily as needed (constipation).    . Blood Glucose Monitoring Suppl (TRUE METRIX METER) w/Device KIT Use as directed 1 kit 0  . diclofenac Sodium (VOLTAREN) 1 % GEL Apply 1 application topically 3 (three) times daily as needed (pain).     . fluticasone (FLONASE) 50 MCG/ACT nasal spray Place 1 spray into both nostrils daily as needed for allergies or rhinitis. 16 g 1  . gabapentin (NEURONTIN) 100 MG capsule Take 1 capsule (100 mg total) by mouth daily at 12 noon. 90 capsule 0  . GlucoCom Lancets MISC 100 Units by Does not  apply route once. 100 each 0  . glucose blood (TRUE METRIX BLOOD GLUCOSE TEST) test strip Use as instructed 100 each 12  . guaiFENesin (TUSSIN EXPECTORANT PO) Take 20 mLs by mouth every 6 (six) hours as needed (congestion).    Marland Kitchen HYDROcodone-acetaminophen (NORCO) 10-325 MG tablet Take 1 tablet by mouth daily as needed (pain).    . hydrOXYzine (ATARAX/VISTARIL) 25 MG tablet TAKE 1 TABLET (25 MG TOTAL) BY MOUTH 2 (TWO) TIMES DAILY AS NEEDED. (Patient taking differently: Take 25 mg by mouth 2 (two) times daily as needed (tingling pain). ) 30 tablet 2  . metFORMIN (GLUCOPHAGE) 500 MG tablet Take 1 tablet (500 mg total) by mouth 2 (two) times daily with a meal. 180 tablet 0  . Multiple Vitamin (MULTIVITAMIN WITH MINERALS) TABS tablet Take 1 tablet by mouth daily.    . pravastatin (PRAVACHOL) 10 MG tablet Take 1 tablet (10 mg total) by mouth daily. 90 tablet 0  . traMADol (ULTRAM) 50 MG tablet Take by mouth every 6 (six) hours as needed.     No current facility-administered medications on file prior to visit.    Allergies  Allergen Reactions  . Lisinopril Swelling    Throat swelling    Social History   Socioeconomic History  . Marital status: Married    Spouse name: Not on file  . Number of children: 5  . Years of education: Not on file  . Highest education level: Not on file  Occupational History  . Occupation: Caregiver    Comment: retired  Tobacco Use  . Smoking status: Former Smoker    Packs/day: 0.25    Years: 5.00    Pack years: 1.25    Types: Cigarettes    Quit date: 05/25/2014    Years since quitting: 6.0  . Smokeless tobacco: Never Used  . Tobacco comment: Quit 05/15/14  Vaping Use  . Vaping Use: Never used  Substance and Sexual Activity  . Alcohol use: No    Alcohol/week: 0.0 standard drinks  . Drug use: No  . Sexual activity: Not Currently  Other Topics Concern  . Not on file  Social History Narrative   Lives with his wife   5 children, grown.   7 grandchildren.     Social Determinants of Health   Financial Resource Strain:   . Difficulty of Paying Living Expenses: Not on file  Food Insecurity:   . Worried About Charity fundraiser in the Last Year: Not on file  . Ran Out of Food in the Last Year: Not on file  Transportation Needs:   . Lack of Transportation (Medical): Not on file  . Lack of Transportation (Non-Medical): Not on file  Physical Activity:   . Days of Exercise per Week: Not on file  . Minutes of Exercise per Session: Not on file  Stress:   . Feeling of Stress : Not on  file  Social Connections:   . Frequency of Communication with Friends and Family: Not on file  . Frequency of Social Gatherings with Friends and Family: Not on file  . Attends Religious Services: Not on file  . Active Member of Clubs or Organizations: Not on file  . Attends Archivist Meetings: Not on file  . Marital Status: Not on file  Intimate Partner Violence:   . Fear of Current or Ex-Partner: Not on file  . Emotionally Abused: Not on file  . Physically Abused: Not on file  . Sexually Abused: Not on file    Family History  Problem Relation Age of Onset  . Diabetes Mother   . Breast cancer Mother   . Diabetes Father   . Breast cancer Sister   . Prostate cancer Neg Hx   . Colon cancer Neg Hx   . Pancreatic cancer Neg Hx     Past Surgical History:  Procedure Laterality Date  . CATARACT EXTRACTION  06/2017  . CYSTOSCOPY  05/2017  . INTERCOSTAL NERVE BLOCK Left 05/13/2020   Procedure: INTERCOSTAL NERVE BLOCK;  Surgeon: Lajuana Matte, MD;  Location: [facility];  Service: Thoracic;  Laterality: Left;  . PROSTATE BIOPSY      ROS: Review of Systems Negative except as stated above  PHYSICAL EXAM: BP 132/79   Pulse 86   Temp 98.2 F (36.8 C)   Resp 16   Wt 189 lb 6.4 oz (85.9 kg)   SpO2 96%   BMI 24.99 kg/m   Wt Readings from Last 3 Encounters:  05/27/20 189 lb 6.4 oz (85.9 kg)  05/24/20 189 lb (85.7 kg)  05/10/20 182 lb 11.2  oz (82.9 kg)    Physical Exam  General appearance - alert, well appearing, elderly African-American male and in no distress Mental status - normal mood, behavior, speech, dress, motor activity, and thought processes Neck - supple, no significant adenopathy Chest -breath sounds are clear in both lung fields.  No crackles or wheezes heard Heart - normal rate, regular rhythm, normal S1, S2, no murmurs, rubs, clicks or gallops Extremities -no lower extremity edema  CMP Latest Ref Rng & Units 05/15/2020 05/14/2020 05/13/2020  Glucose 70 - 99 mg/dL 133(H) 123(H) 137(H)  BUN 8 - 23 mg/dL 23 19 15   Creatinine 0.61 - 1.24 mg/dL 1.39(H) 1.42(H) 1.31(H)  Sodium 135 - 145 mmol/L 136 135 138  Potassium 3.5 - 5.1 mmol/L 3.9 4.5 3.9  Chloride 98 - 111 mmol/L 104 100 107  CO2 22 - 32 mmol/L 24 22 23   Calcium 8.9 - 10.3 mg/dL 8.7(L) 9.1 8.8(L)  Total Protein 6.5 - 8.1 g/dL 6.0(L) - -  Total Bilirubin 0.3 - 1.2 mg/dL 1.0 - -  Alkaline Phos 38 - 126 U/L 48 - -  AST 15 - 41 U/L 47(H) - -  ALT 0 - 44 U/L 41 - -   Lipid Panel     Component Value Date/Time   CHOL 189 01/29/2020 1022   TRIG 123 01/29/2020 1022   HDL 58 01/29/2020 1022   CHOLHDL 3.3 01/29/2020 1022   CHOLHDL 4.2 05/02/2014 0000   VLDL 72 (H) 05/02/2014 0000   LDLCALC 109 (H) 01/29/2020 1022    CBC    Component Value Date/Time   WBC 7.9 05/15/2020 0124   RBC 4.31 05/15/2020 0124   HGB 12.9 (L) 05/15/2020 0124   HGB 13.8 01/29/2020 1022   HCT 38.6 (L) 05/15/2020 0124   HCT 40.6 01/29/2020 1022  PLT 226 05/15/2020 0124   PLT 268 01/29/2020 1022   MCV 89.6 05/15/2020 0124   MCV 89 01/29/2020 1022   MCH 29.9 05/15/2020 0124   MCHC 33.4 05/15/2020 0124   RDW 13.3 05/15/2020 0124   RDW 13.8 01/29/2020 1022   LYMPHSABS 1.5 05/10/2020 1614   MONOABS 0.3 05/10/2020 1614   EOSABS 0.1 05/10/2020 1614   BASOSABS 0.0 05/10/2020 1614    ASSESSMENT AND PLAN: 1. Hospital discharge follow-up   2. History of  pneumothorax Inform patient that exam findings today does not suggest any recurrent pneumothorax as lung sounds are clear in all lung fields.  He is a little anxious and would like to have a chest x-ray done to make sure.  Order placed.  He will keep his follow-up appointment with the CT surgeon again in about 1 month. - DG Chest 2 View; Future  3. Tobacco dependence Advised to quit.  Discussed health risks associated with smoking.  Patient wanting to quit.  We discussed methods to help him quit.  He is agreeable to trying the 7 mg nicotine patch to help with smoking cessation.  Prescription sent to his pharmacy.  Less than 5 minutes spent on counseling.  4. Type 2 diabetes mellitus with other neurologic complication, without long-term current use of insulin (HCC) Continue Metformin.  We will check chemistry today.  Encourage healthy eating habits.  5. Renal insufficiency - Basic Metabolic Panel    Patient was given the opportunity to ask questions.  Patient verbalized understanding of the plan and was able to repeat key elements of the plan.   No orders of the defined types were placed in this encounter.    Requested Prescriptions    No prescriptions requested or ordered in this encounter    No follow-ups on file.  Karle Plumber, MD, FACP

## 2020-05-28 LAB — BASIC METABOLIC PANEL
BUN/Creatinine Ratio: 16 (ref 10–24)
BUN: 19 mg/dL (ref 8–27)
CO2: 24 mmol/L (ref 20–29)
Calcium: 9.3 mg/dL (ref 8.6–10.2)
Chloride: 102 mmol/L (ref 96–106)
Creatinine, Ser: 1.16 mg/dL (ref 0.76–1.27)
GFR calc Af Amer: 74 mL/min/{1.73_m2} (ref >59)
GFR calc non Af Amer: 64 mL/min/{1.73_m2} (ref >59)
Glucose: 174 mg/dL — ABNORMAL HIGH (ref 65–99)
Potassium: 4.7 mmol/L (ref 3.5–5.2)
Sodium: 139 mmol/L (ref 134–144)

## 2020-05-28 NOTE — Progress Notes (Signed)
Let patient know that his kidney function has improved back to his baseline.

## 2020-05-29 ENCOUNTER — Telehealth: Payer: Self-pay | Admitting: Internal Medicine

## 2020-05-29 ENCOUNTER — Telehealth (INDEPENDENT_AMBULATORY_CARE_PROVIDER_SITE_OTHER): Payer: Self-pay

## 2020-05-29 NOTE — Telephone Encounter (Signed)
Phone call placed to patient today to review results of his chest x-ray.  Patient advised that the chest x-ray did not reveal any recurrence of the pneumothorax.  It did show some scarring and atelectasis at the bases.  I explained to him what atelectasis is.  Recommend that he intermittently during the day try to stop and take some deep breaths for 1 minute.  Patient expressed understanding and had no further questions.

## 2020-05-29 NOTE — Telephone Encounter (Signed)
Contacted pt to go over lab results pt didn't answer lvm  

## 2020-06-12 MED FILL — hydrOXYzine HCL 25 MG TABS: 25 | 15 days supply | Qty: 30 | Fill #2

## 2020-06-13 ENCOUNTER — Ambulatory Visit: Payer: Medicare HMO | Admitting: Internal Medicine

## 2020-06-18 MED FILL — ELIQUIS 5 MG TABLET: 5 | 30 days supply | Qty: 60 | Fill #1

## 2020-06-19 ENCOUNTER — Telehealth: Payer: Self-pay | Admitting: Internal Medicine

## 2020-06-19 NOTE — Telephone Encounter (Signed)
I have looked in Dr. Laural Benes last encounter and I don't see anything regarding Ibuprofen. Would you be able to assist with this

## 2020-06-19 NOTE — Telephone Encounter (Signed)
Copied from CRM [id number]. Topic: General - Other >> Jun 19, 2020  3:48 PM Jaquita Rector A wrote: Reason for CRM: Patient called in to inquire of Dr Laural Benes if she will be sending the Rx to his pharmacy for the Ibuprofen 800 MG say that it was discussed at the last visit but that he never received the medication. Please call Ph# [phone]   Did not see in Dr. Henriette Combs note where medication had been discussed. Please follow up.

## 2020-06-20 MED ORDER — DICLOFENAC SODIUM 1 % EX GEL
2.0000 g | Freq: Three times a day (TID) | CUTANEOUS | 0 refills | Status: DC | PRN
Start: 2020-06-20 — End: 2021-11-14
  Filled 2021-04-16: qty 100, 26d supply, fill #0

## 2020-06-20 MED FILL — DICLOFENAC SODIUM 1% GEL: 1 | 16 days supply | Qty: 100 | Fill #0

## 2020-06-20 NOTE — Telephone Encounter (Signed)
Contacted pt and went over Dr. Alvis Lemmings message pt is aware and doesn't have any questions or concerns

## 2020-06-20 NOTE — Telephone Encounter (Signed)
He is on Eliquis and medications like ibuprofen will increase his risk of bleeding.  I have sent a prescription for Voltaren gel to the pharmacy for him which should help with pain

## 2020-06-27 ENCOUNTER — Other Ambulatory Visit: Payer: Self-pay | Admitting: Thoracic Surgery (Cardiothoracic Vascular Surgery)

## 2020-06-27 DIAGNOSIS — Z8709 Personal history of other diseases of the respiratory system: Secondary | ICD-10-CM

## 2020-06-28 ENCOUNTER — Ambulatory Visit: Payer: Self-pay | Admitting: Thoracic Surgery (Cardiothoracic Vascular Surgery)

## 2020-07-12 ENCOUNTER — Ambulatory Visit: Payer: Self-pay | Admitting: Thoracic Surgery (Cardiothoracic Vascular Surgery)

## 2020-07-19 ENCOUNTER — Ambulatory Visit: Payer: Self-pay | Admitting: Thoracic Surgery (Cardiothoracic Vascular Surgery)

## 2020-07-22 MED FILL — FLUTICASONE PROP 50 MCG SPR: 50 | 30 days supply | Qty: 16 | Fill #1

## 2020-07-26 ENCOUNTER — Ambulatory Visit (INDEPENDENT_AMBULATORY_CARE_PROVIDER_SITE_OTHER): Payer: Self-pay | Admitting: Thoracic Surgery (Cardiothoracic Vascular Surgery)

## 2020-07-26 ENCOUNTER — Other Ambulatory Visit: Payer: Self-pay

## 2020-07-26 ENCOUNTER — Encounter: Payer: Self-pay | Admitting: Thoracic Surgery (Cardiothoracic Vascular Surgery)

## 2020-07-26 ENCOUNTER — Ambulatory Visit
Admission: RE | Admit: 2020-07-26 | Discharge: 2020-07-26 | Disposition: A | Discharge status: Home / Self Care | Payer: Medicare HMO | Source: Ambulatory Visit | Attending: Thoracic Surgery (Cardiothoracic Vascular Surgery) | Admitting: Thoracic Surgery (Cardiothoracic Vascular Surgery)

## 2020-07-26 VITALS — BP 150/94 | HR 110 | Resp 20 | Ht 73.0 in | Wt 192.2 lb

## 2020-07-26 DIAGNOSIS — Z8709 Personal history of other diseases of the respiratory system: Secondary | ICD-10-CM

## 2020-07-26 DIAGNOSIS — J939 Pneumothorax, unspecified: Secondary | ICD-10-CM | POA: Diagnosis not present

## 2020-07-26 NOTE — Progress Notes (Signed)
      [address]       [place] [address]             [phone]        [name] Medical Record #[id number] Date of Birth: 1950-12-01  Referring: Aline August, MD Primary Care: [name], MD Primary Cardiologist:No primary care provider on file.  Reason for visit:   follow-up  History of Present Illness:     [name] comes in for his 1 month appointment Overall doing well He denies any respiratory issues  Physical Exam: BP (!) 150/94 (BP Location: Right Arm, Patient Position: Sitting, Cuff Size: Normal)   Pulse (!) 110   Resp 20   Ht 6\' 1"  (1.854 m)   Wt 192 lb 3.2 oz (87.2 kg)   SpO2 95% Comment: RA  BMI 25.36 kg/m   Alert NAD Incision clean Abdomen soft, ND no peripheral edema   Diagnostic Studies & Laboratory data: CXR: clear     Assessment / Plan:   70 yo male s/p RATS wedge resection, apical pleurectomy, and mechanical pleurodesis Doing well  RTC PRN   [name] 07/26/2020 5:17 PM

## 2020-07-30 ENCOUNTER — Other Ambulatory Visit: Payer: Self-pay

## 2020-07-30 ENCOUNTER — Ambulatory Visit (INDEPENDENT_AMBULATORY_CARE_PROVIDER_SITE_OTHER): Payer: Medicare HMO | Admitting: Podiatry

## 2020-07-30 ENCOUNTER — Encounter: Payer: Self-pay | Admitting: Podiatry

## 2020-07-30 DIAGNOSIS — M79674 Pain in right toe(s): Secondary | ICD-10-CM

## 2020-07-30 DIAGNOSIS — M216X1 Other acquired deformities of right foot: Secondary | ICD-10-CM | POA: Insufficient documentation

## 2020-07-30 DIAGNOSIS — D689 Coagulation defect, unspecified: Secondary | ICD-10-CM

## 2020-07-30 DIAGNOSIS — Q828 Other specified congenital malformations of skin: Secondary | ICD-10-CM

## 2020-07-30 DIAGNOSIS — B351 Tinea unguium: Secondary | ICD-10-CM

## 2020-07-30 DIAGNOSIS — M216X2 Other acquired deformities of left foot: Secondary | ICD-10-CM | POA: Insufficient documentation

## 2020-07-30 DIAGNOSIS — E1149 Type 2 diabetes mellitus with other diabetic neurological complication: Secondary | ICD-10-CM

## 2020-07-30 DIAGNOSIS — M79675 Pain in left toe(s): Secondary | ICD-10-CM | POA: Diagnosis not present

## 2020-07-30 NOTE — Progress Notes (Signed)
This patient returns to my office for at risk foot care.  This patient requires this care by a professional since this patient will be at risk due to having diabetes. Patient has neuropathy and is taking gabapentin. This patient is unable to cut nails himself since the patient cannot reach his nails.These nails are painful walking and wearing shoes. Patient is interested in diabetic shoes. This patient presents for at risk foot care today.  General Appearance  Alert, conversant and in no acute stress.  Vascular  Dorsalis pedis and posterior tibial  pulses are palpable  bilaterally.  Capillary return is within normal limits  bilaterally. Temperature is within normal limits  bilaterally.  Neurologic  Senn-Weinstein monofilament wire test  diminished  bilaterally. Muscle power within normal limits bilaterally.  Nails Thick disfigured discolored nails with subungual debris  from hallux to fifth toes bilaterally. No evidence of bacterial infection or drainage bilaterally.  Orthopedic  No limitations of motion  feet .  No crepitus or effusions noted.  No bony pathology or digital deformities noted. HAV  B/L. Plantar flexed fifth metatarsals  B/L.  Skin  normotropic skin  noted bilaterally.  No signs of infections or ulcers noted.  Corn medial aspect second toe  DIPJ.  Callus sub 5th  B/L.  Onychomycosis  Pain in right toes  Pain in left toes  Porokeratosis sub5th met  B/L asymptomatic.  Consent was obtained for treatment procedures.   Mechanical debridement of nails 1-5  bilaterally performed with a nail nipper.  Filed with dremel without incident.  Debride callus with # 15 blade and dremel tool usage.  Patient qualifies for diabetic shoes due to DPN , HAV  B/L and plantar flexed fifth metatarsals  B/L.   Return office visit   3 months                  Told patient to return for periodic foot care and evaluation due to potential at risk complications.     DPM  

## 2020-08-01 ENCOUNTER — Telehealth: Payer: Self-pay | Admitting: Orthotics

## 2020-08-15 ENCOUNTER — Other Ambulatory Visit: Payer: Self-pay | Admitting: Internal Medicine

## 2020-08-15 DIAGNOSIS — L309 Dermatitis, unspecified: Secondary | ICD-10-CM

## 2020-08-15 MED FILL — hydrOXYzine HCL 25 MG TABS: 25 | 30 days supply | Qty: 30 | Fill #0

## 2020-09-09 ENCOUNTER — Other Ambulatory Visit: Payer: Medicare HMO

## 2020-09-10 ENCOUNTER — Other Ambulatory Visit: Payer: Self-pay

## 2020-09-10 ENCOUNTER — Ambulatory Visit (INDEPENDENT_AMBULATORY_CARE_PROVIDER_SITE_OTHER): Payer: Medicare HMO | Admitting: *Deleted

## 2020-09-10 DIAGNOSIS — M216X1 Other acquired deformities of right foot: Secondary | ICD-10-CM

## 2020-09-10 DIAGNOSIS — M216X2 Other acquired deformities of left foot: Secondary | ICD-10-CM | POA: Diagnosis not present

## 2020-09-10 DIAGNOSIS — E1149 Type 2 diabetes mellitus with other diabetic neurological complication: Secondary | ICD-10-CM

## 2020-09-10 NOTE — Progress Notes (Signed)
Patient presents today to pick up diabetic shoes and insoles.  Patient was dispensed 1 pair of diabetic shoes and 3 pairs of foam casted diabetic insoles. Fit was satisfactory. Instructions for break-in and wear was reviewed and a copy was given to the patient.   Re-appointment for regularly scheduled diabetic foot care visits or if they should experience any trouble with the shoes or insoles.  

## 2020-09-16 ENCOUNTER — Ambulatory Visit: Payer: Medicare HMO | Admitting: Dermatology

## 2020-09-21 ENCOUNTER — Other Ambulatory Visit: Payer: Self-pay

## 2020-09-23 ENCOUNTER — Other Ambulatory Visit: Payer: Self-pay

## 2020-09-23 MED FILL — Apixaban Tab 5 MG: ORAL | 30 days supply | Qty: 60 | Fill #0 | Status: AC

## 2020-09-25 ENCOUNTER — Other Ambulatory Visit: Payer: Self-pay

## 2020-10-28 ENCOUNTER — Encounter: Payer: Self-pay | Admitting: Internal Medicine

## 2020-10-28 ENCOUNTER — Other Ambulatory Visit: Payer: Self-pay

## 2020-10-28 ENCOUNTER — Ambulatory Visit: Payer: Medicare HMO | Attending: Internal Medicine | Admitting: Internal Medicine

## 2020-10-28 VITALS — BP 136/74 | HR 94 | Ht 73.0 in | Wt 192.0 lb

## 2020-10-28 DIAGNOSIS — L309 Dermatitis, unspecified: Secondary | ICD-10-CM | POA: Diagnosis not present

## 2020-10-28 DIAGNOSIS — E1149 Type 2 diabetes mellitus with other diabetic neurological complication: Secondary | ICD-10-CM | POA: Diagnosis not present

## 2020-10-28 DIAGNOSIS — I10 Essential (primary) hypertension: Secondary | ICD-10-CM

## 2020-10-28 DIAGNOSIS — C61 Malignant neoplasm of prostate: Secondary | ICD-10-CM | POA: Diagnosis not present

## 2020-10-28 DIAGNOSIS — I69354 Hemiplegia and hemiparesis following cerebral infarction affecting left non-dominant side: Secondary | ICD-10-CM | POA: Diagnosis not present

## 2020-10-28 DIAGNOSIS — I480 Paroxysmal atrial fibrillation: Secondary | ICD-10-CM | POA: Diagnosis not present

## 2020-10-28 LAB — POCT GLYCOSYLATED HEMOGLOBIN (HGB A1C): HbA1c, POC (controlled diabetic range): 7.1 % — AB (ref 0.0–7.0)

## 2020-10-28 LAB — GLUCOSE, POCT (MANUAL RESULT ENTRY): POC Glucose: 187 mg/dl — AB (ref 70–99)

## 2020-10-28 MED ORDER — HYDROXYZINE HCL 25 MG PO TABS
ORAL_TABLET | ORAL | 2 refills | Status: AC
  Filled 2020-10-28 – 2021-10-06 (×2): qty 30, 15d supply, fill #0

## 2020-10-28 MED ORDER — AMLODIPINE BESYLATE 10 MG PO TABS
10.0000 mg | ORAL_TABLET | Freq: Every day | ORAL | 1 refills | Status: DC
  Filled 2020-10-28: qty 90, 90d supply, fill #0
  Filled 2021-04-14: qty 90, 90d supply, fill #1

## 2020-10-28 NOTE — Progress Notes (Signed)
Patient ID: [name], male    DOB: 1951-03-22  MRN: [id number]  CC: Diabetes   Subjective: [name] is a 70 y.o. 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   HTN:  Out Norvasc x2 days.  Try to get it refilled through the pharmacy but states he was told that it looks like the medicine was discontinued when he was discharged from the hospital..   Limits salt.  Denies any chest pains, shortness of breath, lower extremity edema  Request RF on Hydroxyzine which she uses as needed for hive-like rash. Small outbreak today on rash on LT upper arm, and right forearm.  Not sure of what causes it.  DM: Taking and tolerating Metformin Doing well with eating habits.  Has a veggie garden Checks BS QOD range 100-120 Results for orders placed or performed in visit on 10/28/20  POCT glucose (manual entry)  Result Value Ref Range   POC Glucose 187 (A) 70 - 99 mg/dl  POCT glycosylated hemoglobin (Hb A1C)  Result Value Ref Range   Hemoglobin A1C     HbA1c POC (<> result, manual entry)     HbA1c, POC (prediabetic range)     HbA1c, POC (controlled diabetic range) 7.1 (A) 0.0 - 7.0 %    Sometimes uses Flonase and takes over-the-counter Minerva Ends if he has a cough with allergies.  Wants to know whether either of these medicines can cause him to get another pneumothorax.    A.fib: Reports no palpitations.  No bruising or bleeding on Eliquis.  He is taking the medicine consistently.    History of prostate cancer: As appt on 11/13/2020 with the urologist to discuss surgery.    Hx CVA:  Ambulates with cane.  No falls.  Taking Pravachol and amlodipine Patient Active Problem List   Diagnosis Date Noted  . Plantar flexed metatarsal, left 07/30/2020  . Plantar flexed metatarsal, right 07/30/2020  .  Tobacco dependence 05/27/2020  . History of pneumothorax 05/27/2020  . Pneumothorax 05/10/2020  . Blood clotting disorder ([place]) 04/23/2020  . Stressful life event affecting family 01/29/2020  . Hyperlipidemia associated with type 2 diabetes mellitus ([place]) 01/29/2020  . Personal history of fall 01/29/2020  . 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 Unintended weight loss 01/29/2020  . Pain due to onychomycosis of toenails of both feet 08/01/2019  . Porokeratosis 08/01/2019  . Influenza vaccine needed 02/24/2019  . Diabetes mellitus ([place]) 02/24/2019  . Malignant neoplasm of prostate ([place]) 09/23/2018  . Hyperlipemia 11/24/2016  . History of CVA with residual deficit 11/24/2016  . GERD (gastroesophageal reflux disease) 08/09/2014  . Atrial fibrillation ([place])   . Hemiparesis affecting left side as late effect of stroke ([place]) 05/01/2014  . 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 DEGENERATIVE JOINT DISEASE, CERVICAL SPINE 04/24/2009  . DM2 (diabetes mellitus, type 2) ([place]) 07/03/2006  . Former smoker 07/03/2006  . HIATAL HERNIA WITH REFLUX 07/03/2006  . Essential hypertension 06/08/2006     Current Outpatient Medications on File Prior to Visit  Medication Sig Dispense Refill  . apixaban (ELIQUIS) 5 MG TABS tablet TAKE 1 TABLET BY MOUTH TWICE DAILY. (Patient taking differently: Take 5 mg by mouth 2 (two) times daily.) 60 tablet 2  . B Complex Vitamins (VITAMIN B COMPLEX) TABS Take 1 tablet by mouth daily.    Marland Kitchen  bisacodyl (DULCOLAX) 5 MG EC tablet Take 5 mg by mouth daily as needed (constipation).    . Blood Glucose Monitoring Suppl (TRUE METRIX METER) w/Device KIT Use as directed 1 kit 0  . diclofenac Sodium (VOLTAREN) 1 % GEL Apply 2 g topically 3 (three) times daily as needed (pain). 100 g 1  . GlucoCom Lancets MISC 100 Units by Does not apply route once. 100 each 0  . glucose blood (TRUE METRIX BLOOD GLUCOSE TEST) test strip Use as instructed 100 each 12  .  HYDROcodone-acetaminophen (NORCO) 10-325 MG tablet Take 1 tablet by mouth daily as needed (pain).    . Multiple Vitamin (MULTIVITAMIN WITH MINERALS) TABS tablet Take 1 tablet by mouth daily.    . nicotine (NICODERM CQ - DOSED IN MG/24 HR) 7 mg/24hr patch Place 1 patch (7 mg total) onto the skin daily. 28 patch 1  . traMADol (ULTRAM) 50 MG tablet Take by mouth every 6 (six) hours as needed.    Marland Kitchen apixaban (ELIQUIS) 5 MG TABS tablet Take 1 tablet (5 mg total) by mouth 2 (two) times daily. 180 tablet 0  . fluticasone (FLONASE) 50 MCG/ACT nasal spray PLACE 1 SPRAY INTO BOTH NOSTRILS DAILY AS NEEDED FOR ALLERGIES OR RHINITIS. 16 g 1  . gabapentin (NEURONTIN) 100 MG capsule Take 1 capsule (100 mg total) by mouth daily at 12 noon. 90 capsule 0  . metFORMIN (GLUCOPHAGE) 500 MG tablet Take 1 tablet (500 mg total) by mouth 2 (two) times daily with a meal. 180 tablet 0  . pravastatin (PRAVACHOL) 10 MG tablet Take 1 tablet (10 mg total) by mouth daily. 90 tablet 0   No current facility-administered medications on file prior to visit.    Allergies  Allergen Reactions  . Lisinopril Swelling    Throat swelling    Social History   Socioeconomic History  . Marital status: Married    Spouse name: Not on file  . Number of children: 5  . Years of education: Not on file  . Highest education level: Not on file  Occupational History  . Occupation: Caregiver    Comment: retired  Tobacco Use  . Smoking status: Former Smoker    Packs/day: 0.25    Years: 5.00    Pack years: 1.25    Types: Cigarettes    Quit date: 05/25/2014    Years since quitting: 6.4  . Smokeless tobacco: Never Used  . Tobacco comment: Quit 05/15/14  Vaping Use  . Vaping Use: Never used  Substance and Sexual Activity  . Alcohol use: No    Alcohol/week: 0.0 standard drinks  . Drug use: No  . Sexual activity: Not Currently  Other Topics Concern  . Not on file  Social History Narrative   Lives with his wife   5 children, grown.    7 grandchildren.    Social Determinants of Health   Financial Resource Strain: Not on file  Food Insecurity: Not on file  Transportation Needs: Not on file  Physical Activity: Not on file  Stress: Not on file  Social Connections: Not on file  Intimate Partner Violence: Not on file    Family History  Problem Relation Age of Onset  . Diabetes Mother   . Breast cancer Mother   . Diabetes Father   . Breast cancer Sister   . Prostate cancer Neg Hx   . Colon cancer Neg Hx   . Pancreatic cancer Neg Hx     Past Surgical History:  Procedure Laterality  Date  . CATARACT EXTRACTION  06/2017  . CYSTOSCOPY  05/2017  . INTERCOSTAL NERVE BLOCK Left 05/13/2020   Procedure: INTERCOSTAL NERVE BLOCK;  Surgeon: Lajuana Matte, MD;  Location: [facility];  Service: Thoracic;  Laterality: Left;  . PROSTATE BIOPSY      ROS: Review of Systems Negative except as stated above  PHYSICAL EXAM: BP 136/74   Pulse 94   Ht 6' 1"  (1.854 m)   Wt 192 lb (87.1 kg)   SpO2 97%   BMI 25.33 kg/m   Wt Readings from Last 3 Encounters:  10/28/20 192 lb (87.1 kg)  07/26/20 192 lb 3.2 oz (87.2 kg)  05/27/20 189 lb 6.4 oz (85.9 kg)    Physical Exam  General appearance - alert, well appearing, and in no distress Mental status - normal mood, behavior, speech, dress, motor activity, and thought processes Neck - supple, no significant adenopathy Chest - clear to auscultation, no wheezes, rales or rhonchi, symmetric air entry Heart - normal rate, regular rhythm, normal S1, S2, no murmurs, rubs, clicks or gallops Extremities - peripheral pulses normal, no pedal edema, no clubbing or cyanosis Skin: Fine papular patchy rash noted on the left upper arm. CMP Latest Ref Rng & Units 05/27/2020 05/15/2020 05/14/2020  Glucose 65 - 99 mg/dL 174(H) 133(H) 123(H)  BUN 8 - 27 mg/dL 19 23 19   Creatinine 0.76 - 1.27 mg/dL 1.16 1.39(H) 1.42(H)  Sodium 134 - 144 mmol/L 139 136 135  Potassium 3.5 - 5.2 mmol/L 4.7 3.9  4.5  Chloride 96 - 106 mmol/L 102 104 100  CO2 20 - 29 mmol/L 24 24 22   Calcium 8.6 - 10.2 mg/dL 9.3 8.7(L) 9.1  Total Protein 6.5 - 8.1 g/dL - 6.0(L) -  Total Bilirubin 0.3 - 1.2 mg/dL - 1.0 -  Alkaline Phos 38 - 126 U/L - 48 -  AST 15 - 41 U/L - 47(H) -  ALT 0 - 44 U/L - 41 -   Lipid Panel     Component Value Date/Time   CHOL 189 01/29/2020 1022   TRIG 123 01/29/2020 1022   HDL 58 01/29/2020 1022   CHOLHDL 3.3 01/29/2020 1022   CHOLHDL 4.2 05/02/2014 0000   VLDL 72 (H) 05/02/2014 0000   LDLCALC 109 (H) 01/29/2020 1022    CBC    Component Value Date/Time   WBC 7.9 05/15/2020 0124   RBC 4.31 05/15/2020 0124   HGB 12.9 (L) 05/15/2020 0124   HGB 13.8 01/29/2020 1022   HCT 38.6 (L) 05/15/2020 0124   HCT 40.6 01/29/2020 1022   PLT 226 05/15/2020 0124   PLT 268 01/29/2020 1022   MCV 89.6 05/15/2020 0124   MCV 89 01/29/2020 1022   MCH 29.9 05/15/2020 0124   MCHC 33.4 05/15/2020 0124   RDW 13.3 05/15/2020 0124   RDW 13.8 01/29/2020 1022   LYMPHSABS 1.5 05/10/2020 1614   MONOABS 0.3 05/10/2020 1614   EOSABS 0.1 05/10/2020 1614   BASOSABS 0.0 05/10/2020 1614    ASSESSMENT AND PLAN:  1. Type 2 diabetes mellitus with other neurologic complication, without long-term current use of insulin (HCC) A1c close to goal. We decided not to do any medication changes at this time.  He will continue metformin.  Encouraged him to move as much as he can.  Continue healthy eating habits.  We discussed fruits that have higher sugar content versus ones that are lower. - POCT glucose (manual entry) - POCT glycosylated hemoglobin (Hb A1C) - Microalbumin / creatinine urine  ratio; Future  2. Essential hypertension Not at goal but he has been out of Norvasc for 2 days.  Refill given - amLODipine (NORVASC) 10 MG tablet; Take 1 tablet (10 mg total) by mouth daily.  Dispense: 90 tablet; Refill: 1  3. Paroxysmal atrial fibrillation (HCC) Stable on Eliquis.  No palpitations.  4. Malignant  neoplasm of prostate [facility]) He will keep follow-up appointment with the urologist later this month to discuss management options.  5. Hemiparesis affecting left side as late effect of stroke (HCC) Stable.  Continue Eliquis, Norvasc, and pravastatin  6. Dermatitis - hydrOXYzine (ATARAX/VISTARIL) 25 MG tablet; TAKE 1 TABLET (25 MG TOTAL) BY MOUTH 2 (TWO) TIMES DAILY AS NEEDED.  Dispense: 30 tablet; Refill: 2      Patient was given the opportunity to ask questions.  Patient verbalized understanding of the plan and was able to repeat key elements of the plan.   Orders Placed This Encounter  Procedures  . Microalbumin / creatinine urine ratio  . POCT glucose (manual entry)  . POCT glycosylated hemoglobin (Hb A1C)     Requested Prescriptions   Signed Prescriptions Disp Refills  . amLODipine (NORVASC) 10 MG tablet 90 tablet 1    Sig: Take 1 tablet (10 mg total) by mouth daily.  . hydrOXYzine (ATARAX/VISTARIL) 25 MG tablet 30 tablet 2    Sig: TAKE 1 TABLET (25 MG TOTAL) BY MOUTH 2 (TWO) TIMES DAILY AS NEEDED.    Follow-up with me in 4 months.  Give appointment with clinical pharmacist in 1 month for Medicare wellness exam.  Karle Plumber, MD, Rosalita Chessman

## 2020-10-29 ENCOUNTER — Encounter: Payer: Self-pay | Admitting: Podiatry

## 2020-10-29 ENCOUNTER — Other Ambulatory Visit: Payer: Self-pay

## 2020-10-29 ENCOUNTER — Ambulatory Visit (INDEPENDENT_AMBULATORY_CARE_PROVIDER_SITE_OTHER): Payer: Medicare HMO | Admitting: Podiatry

## 2020-10-29 DIAGNOSIS — B351 Tinea unguium: Secondary | ICD-10-CM

## 2020-10-29 DIAGNOSIS — D689 Coagulation defect, unspecified: Secondary | ICD-10-CM

## 2020-10-29 DIAGNOSIS — M79675 Pain in left toe(s): Secondary | ICD-10-CM | POA: Diagnosis not present

## 2020-10-29 DIAGNOSIS — E1149 Type 2 diabetes mellitus with other diabetic neurological complication: Secondary | ICD-10-CM

## 2020-10-29 DIAGNOSIS — Z23 Encounter for immunization: Secondary | ICD-10-CM | POA: Diagnosis not present

## 2020-10-29 DIAGNOSIS — M79674 Pain in right toe(s): Secondary | ICD-10-CM

## 2020-10-29 NOTE — Progress Notes (Signed)
This patient returns to my office for at risk foot care.  This patient requires this care by a professional since this patient will be at risk due to having diabetes.  This patient is unable to cut nails himself since the patient cannot reach his nails.These nails are painful walking and wearing shoes.  This patient presents for at risk foot care today.  General Appearance  Alert, conversant and in no acute stress.  Vascular  Dorsalis pedis and posterior tibial  pulses are palpable  bilaterally.  Capillary return is within normal limits  bilaterally. Temperature is within normal limits  bilaterally.  Neurologic  Senn-Weinstein monofilament wire test within normal limits  bilaterally. Muscle power within normal limits bilaterally.  Nails Thick disfigured discolored nails with subungual debris  from hallux to fifth toes bilaterally. No evidence of bacterial infection or drainage bilaterally.  Orthopedic  No limitations of motion  feet .  No crepitus or effusions noted.  No bony pathology or digital deformities noted.  Skin  normotropic skin with no porokeratosis noted bilaterally.  No signs of infections or ulcers noted.     Onychomycosis  Pain in right toes  Pain in left toes  Porokeratosis sub5th met  B/L asymptomatic.  Consent was obtained for treatment procedures.   Mechanical debridement of nails 1-5  bilaterally performed with a nail nipper.  Filed with dremel without incident.     Return office visit   3 months                  Told patient to return for periodic foot care and evaluation due to potential at risk complications.   Gardiner Barefoot DPM

## 2020-10-29 NOTE — Addendum Note (Signed)
Addended bySteffanie Dunn on: 10/29/2020 03:19 PM   Modules accepted: Orders

## 2020-10-30 LAB — MICROALBUMIN / CREATININE URINE RATIO
Creatinine, Urine: 24.1 mg/dL
Microalb/Creat Ratio: <12 mg/g creat (ref 0–29)
Microalbumin, Urine: <3 ug/mL

## 2020-11-08 DIAGNOSIS — C61 Malignant neoplasm of prostate: Secondary | ICD-10-CM | POA: Diagnosis not present

## 2020-11-21 ENCOUNTER — Telehealth: Payer: Self-pay | Admitting: Internal Medicine

## 2020-11-21 NOTE — Telephone Encounter (Signed)
Patient would like the nurse or doctor to call as soon as his blood test results come in.  CB# [phone]

## 2020-11-21 NOTE — Telephone Encounter (Signed)
Per nurse ok to let patient know his urine was normal. Patient concerned about his PSA and I let him know that was not checked. Patient would like orders placed for his PSA to checked and would like a call from nurse when orders are placed. Please advise.

## 2020-11-22 NOTE — Telephone Encounter (Signed)
Pt has an upcoming appt with luke on 6/10

## 2020-11-22 NOTE — Telephone Encounter (Signed)
Contacted pt and went over provider response pt is aware and doesn't have any questions or concerns  

## 2020-11-25 ENCOUNTER — Other Ambulatory Visit (HOSPITAL_COMMUNITY): Payer: Self-pay | Admitting: Urology

## 2020-11-25 DIAGNOSIS — C61 Malignant neoplasm of prostate: Secondary | ICD-10-CM

## 2020-11-29 ENCOUNTER — Other Ambulatory Visit: Payer: Self-pay

## 2020-11-29 ENCOUNTER — Ambulatory Visit: Payer: Medicare HMO | Attending: Internal Medicine | Admitting: Pharmacist

## 2020-11-29 VITALS — BP 160/84 | HR 85 | Resp 16 | Ht 73.0 in | Wt 191.2 lb

## 2020-11-29 DIAGNOSIS — Z0000 Encounter for general adult medical examination without abnormal findings: Secondary | ICD-10-CM

## 2020-11-29 NOTE — Progress Notes (Signed)
Subjective:   [name] is a 70 y.o. male who presents for Medicare Annual/Subsequent preventive examination.    Objective:    Today's Vitals   11/29/20 1546  BP: (!) 160/84  Pulse: 85  Resp: 16  SpO2: 98%  Weight: 191 lb 3.2 oz (86.7 kg)  Height: 6' 1"  (1.854 m)   Body mass index is 25.23 kg/m.  Advanced Directives 11/29/2020 11/13/2019 09/04/2017 04/19/2017 03/23/2017 12/04/2016 11/24/2016  Does Patient Have a Medical Advance Directive? No No No No No No No  Would patient like information on creating a medical advance directive? Yes (ED - Information included in AVS) No - Patient declined No - Patient declined No - Patient declined - No - Patient declined -    Current Medications (verified) Outpatient Encounter Medications as of 11/29/2020  Medication Sig   amLODipine (NORVASC) 10 MG tablet Take 1 tablet (10 mg total) by mouth daily.   apixaban (ELIQUIS) 5 MG TABS tablet Take 1 tablet (5 mg total) by mouth 2 (two) times daily.   apixaban (ELIQUIS) 5 MG TABS tablet TAKE 1 TABLET BY MOUTH TWICE DAILY. (Patient taking differently: Take 5 mg by mouth 2 (two) times daily.)   B Complex Vitamins (VITAMIN B COMPLEX) TABS Take 1 tablet by mouth daily.   bisacodyl (DULCOLAX) 5 MG EC tablet Take 5 mg by mouth daily as needed (constipation).   Blood Glucose Monitoring Suppl (TRUE METRIX METER) w/Device KIT Use as directed   diclofenac Sodium (VOLTAREN) 1 % GEL Apply 2 g topically 3 (three) times daily as needed (pain).   fluticasone (FLONASE) 50 MCG/ACT nasal spray PLACE 1 SPRAY INTO BOTH NOSTRILS DAILY AS NEEDED FOR ALLERGIES OR RHINITIS.   gabapentin (NEURONTIN) 100 MG capsule Take 1 capsule (100 mg total) by mouth daily at 12 noon.   GlucoCom Lancets MISC 100 Units by Does not apply route once.   glucose blood (TRUE METRIX BLOOD GLUCOSE TEST) test strip Use as instructed   HYDROcodone-acetaminophen (NORCO) 10-325 MG tablet Take 1 tablet by mouth daily as needed (pain).    hydrOXYzine (ATARAX/VISTARIL) 25 MG tablet TAKE 1 TABLET (25 MG TOTAL) BY MOUTH 2 (TWO) TIMES DAILY AS NEEDED.   metFORMIN (GLUCOPHAGE) 500 MG tablet Take 1 tablet (500 mg total) by mouth 2 (two) times daily with a meal.   Multiple Vitamin (MULTIVITAMIN WITH MINERALS) TABS tablet Take 1 tablet by mouth daily.   nicotine (NICODERM CQ - DOSED IN MG/24 HR) 7 mg/24hr patch Place 1 patch (7 mg total) onto the skin daily.   pravastatin (PRAVACHOL) 10 MG tablet Take 1 tablet (10 mg total) by mouth daily.   traMADol (ULTRAM) 50 MG tablet Take by mouth every 6 (six) hours as needed.   No facility-administered encounter medications on file as of 11/29/2020.    Allergies (verified) Lisinopril   History: Past Medical History:  Diagnosis Date   Abscess of anal and rectal regions 07/15/2007   Qualifier: Diagnosis of  By: Amil Amen MD, Elizabeth     Atrial fibrillation [facility])    Diabetes mellitus without complication [facility]) Dx [id number]   FISTULA, ANAL 01/27/2007   Qualifier: Diagnosis of  By: Amil Amen MD, Elizabeth     Hypertension Dx 2008   Prostate cancer [facility])    Stroke [facility])    Past Surgical History:  Procedure Laterality Date   CATARACT EXTRACTION  06/2017   CYSTOSCOPY  05/2017   INTERCOSTAL NERVE BLOCK Left 05/13/2020   Procedure: INTERCOSTAL NERVE BLOCK;  Surgeon: Melodie Bouillon  O, MD;  Location: [facility] OR;  Service: Thoracic;  Laterality: Left;   PROSTATE BIOPSY     Family History  Problem Relation Age of Onset   Diabetes Mother    Breast cancer Mother    Diabetes Father    Breast cancer Sister    Prostate cancer Neg Hx    Colon cancer Neg Hx    Pancreatic cancer Neg Hx    Social History   Socioeconomic History   Marital status: Married    Spouse name: Not on file   Number of children: 5   Years of education: Not on file   Highest education level: Not on file  Occupational History   Occupation: Caregiver    Comment: retired  Tobacco Use   Smoking status: Former    Packs/day:  0.25    Years: 5.00    Pack years: 1.25    Types: Cigarettes    Quit date: 05/25/2014    Years since quitting: 6.5   Smokeless tobacco: Never   Tobacco comments:    Quit 05/15/14  Vaping Use   Vaping Use: Never used  Substance and Sexual Activity   Alcohol use: No    Alcohol/week: 0.0 standard drinks   Drug use: No   Sexual activity: Not Currently  Other Topics Concern   Not on file  Social History Narrative   Lives with his wife   5 children, grown.   7 grandchildren.    Social Determinants of Health   Financial Resource Strain: Not on file  Food Insecurity: Not on file  Transportation Needs: Not on file  Physical Activity: Not on file  Stress: Not on file  Social Connections: Not on file    Tobacco Counseling Counseling given: Not Answered Tobacco comments: Quit 05/15/14   Clinical Intake:     Pain : No/denies pain     Diabetes: Yes CBG done?: No     Diabetic?Yes         Activities of Daily Living In your present state of health, do you have any difficulty performing the following activities: 11/29/2020  Hearing? N  Vision? N  Difficulty concentrating or making decisions? N  Walking or climbing stairs? Y  Dressing or bathing? N  Doing errands, shopping? N  Preparing Food and eating ? N  Using the Toilet? N  In the past six months, have you accidently leaked urine? N  Do you have problems with loss of bowel control? Y  Managing your Medications? N  Managing your Finances? N  Housekeeping or managing your Housekeeping? N  Some recent data might be hidden    Patient Care Team: [name], MD as PCP - General (Internal Medicine) Cira Rue, RN Nurse Navigator as Registered Nurse (Medical Oncology)  Indicate any recent Medical Services you may have received from other than [facility] providers in the past year (date may be approximate).     Assessment:   This is a routine wellness examination for [name].  Hearing/Vision screen No  results found.  Dietary issues and exercise activities discussed:     Goals Addressed   None   Depression Screen PHQ 2/9 Scores 11/29/2020 10/28/2020 05/27/2020 04/08/2020 01/29/2020 11/13/2019 10/24/2019  PHQ - 2 Score - - 0 0 0 0 0  PHQ- 9 Score - - - - - - -  Exception Documentation Patient refusal Patient refusal - - - - -    Fall Risk Fall Risk  11/29/2020 10/28/2020 04/08/2020 01/29/2020 11/13/2019  Falls in the  past year? 1 0 0 1 1  Number falls in past yr: 1 0 0 1 1  Injury with Fall? 0 0 0 1 1  Comment - - - - Minor injury; no head injury of LOC  Risk for fall due to : History of fall(s) - - - History of fall(s);Impaired mobility;Impaired vision  Follow up Falls evaluation completed;Education provided;Falls prevention discussed - - Education provided Falls evaluation completed;Education provided;Falls prevention discussed    FALL RISK PREVENTION PERTAINING TO THE HOME:  Any stairs in or around the home? Yes  If so, are there any without handrails? No  Home free of loose throw rugs in walkways, pet beds, electrical cords, etc? Yes  Adequate lighting in your home to reduce risk of falls? Yes   ASSISTIVE DEVICES UTILIZED TO PREVENT FALLS:  Life alert? No  Use of a cane, walker or w/c? Yes  Grab bars in the bathroom? Yes  Shower chair or bench in shower? No  Elevated toilet seat or a handicapped toilet? No   TIMED UP AND GO:  Was the test performed? Yes .  Length of time to ambulate 10 feet: 6 sec.   Gait slow and steady with assistive device  Cognitive Function: MMSE - Mini Mental State Exam 11/29/2020 11/13/2019  Orientation to time 5 5  Orientation to Place 5 5  Registration 3 3  Attention/ Calculation 5 5  Recall 3 3  Language- name 2 objects 2 2  Language- repeat 1 1  Language- follow 3 step command 3 3  Language- read & follow direction 1 1  Write a sentence 1 1  Copy design 0 1  Total score 29 30        Immunizations Immunization History  Administered  Date(s) Administered   Influenza Whole 03/27/2009, 07/17/2010   Influenza,inj,Quad PF,6+ Mos 06/09/2018, 03/01/2019, 04/25/2020   PFIZER(Purple Top)SARS-COV-2 Vaccination 07/29/2019, 08/21/2019, 04/08/2020   Pneumococcal Conjugate-13 03/23/2017   Pneumococcal Polysaccharide-23 07/17/2010, 06/09/2018   Td 06/22/2005   Tdap 01/29/2020    TDAP status: Up to date  Flu Vaccine status: Up to date  Pneumococcal vaccine status: Up to date  Covid-19 vaccine status: Completed vaccines - has not received 2nd booster   Qualifies for Shingles Vaccine? Yes   Zostavax completed No   Shingrix Completed?: No.    Education has been provided regarding the importance of this vaccine. Patient has been advised to call insurance company to determine out of pocket expense if they have not yet received this vaccine. Advised may also receive vaccine at local pharmacy or Health Dept. Verbalized acceptance and understanding.  Screening Tests Health Maintenance  Topic Date Due   Zoster Vaccines- Shingrix (1 of 2) Never done   COVID-19 Vaccine (4 - Booster for Pfizer series) 07/09/2020   OPHTHALMOLOGY EXAM  11/12/2020   INFLUENZA VACCINE  01/20/2021   HEMOGLOBIN A1C  04/30/2021   FOOT EXAM  07/30/2021   URINE MICROALBUMIN  10/29/2021   Fecal DNA (Cologuard)  03/22/2023   TETANUS/TDAP  01/28/2030   Hepatitis C Screening  Completed   PNA vac Low Risk Adult  Completed   HPV VACCINES  Aged Out    Health Maintenance  Health Maintenance Due  Topic Date Due   Zoster Vaccines- Shingrix (1 of 2) Never done   COVID-19 Vaccine (4 - Booster for Pfizer series) 07/09/2020   OPHTHALMOLOGY EXAM  11/12/2020    Colorectal cancer screening: Type of screening: Cologuard. Completed 03/21/2020. Repeat every 3 years  Lung Cancer  Screening: (Low Dose CT Chest recommended if Age 25-80 years, 30 pack-year currently smoking OR have quit w/in 15years.) does not qualify.   Additional Screening:  Hepatitis C Screening:  does qualify; Completed 11/13/2019  Vision Screening: Recommended annual ophthalmology exams for early detection of glaucoma and other disorders of the eye. Is the patient up to date with their annual eye exam?  Yes   Dental Screening: Recommended annual dental exams for proper oral hygiene  Community Resource Referral / Chronic Care Management: CRR required this visit?  No   CCM required this visit?  No      Plan:     I have personally reviewed and noted the following in the patient's chart:   Medical and social history Use of alcohol, tobacco or illicit drugs  Current medications and supplements including opioid prescriptions. Patient is currently taking opioid prescriptions. Information provided to patient regarding non-opioid alternatives. Patient advised to discuss non-opioid treatment plan with their provider. Functional ability and status Nutritional status Physical activity Advanced directives List of other physicians Hospitalizations, surgeries, and ER visits in previous 12 months Vitals Screenings to include cognitive, depression, and falls Referrals and appointments  In addition, I have reviewed and discussed with patient certain preventive protocols, quality metrics, and best practice recommendations. A written personalized care plan for preventive services as well as general preventive health recommendations were provided to patient.     Tresa Endo, RPH-CPP   11/29/2020

## 2020-12-05 ENCOUNTER — Other Ambulatory Visit: Payer: Self-pay

## 2020-12-05 DIAGNOSIS — E119 Type 2 diabetes mellitus without complications: Secondary | ICD-10-CM | POA: Diagnosis not present

## 2020-12-05 DIAGNOSIS — H26492 Other secondary cataract, left eye: Secondary | ICD-10-CM | POA: Diagnosis not present

## 2020-12-05 DIAGNOSIS — H401131 Primary open-angle glaucoma, bilateral, mild stage: Secondary | ICD-10-CM | POA: Diagnosis not present

## 2020-12-05 DIAGNOSIS — Z961 Presence of intraocular lens: Secondary | ICD-10-CM | POA: Diagnosis not present

## 2020-12-05 LAB — HM DIABETES EYE EXAM

## 2020-12-05 MED ORDER — LATANOPROST 0.005 % OP SOLN
OPHTHALMIC | 3 refills | Status: DC
  Filled 2020-12-05: qty 2.5, 25d supply, fill #0

## 2020-12-06 ENCOUNTER — Other Ambulatory Visit: Payer: Self-pay

## 2020-12-16 ENCOUNTER — Other Ambulatory Visit: Payer: Self-pay

## 2020-12-16 ENCOUNTER — Ambulatory Visit (HOSPITAL_COMMUNITY)
Admission: RE | Admit: 2020-12-16 | Discharge: 2020-12-16 | Disposition: A | Discharge status: Home / Self Care | Payer: Medicare HMO | Source: Ambulatory Visit | Attending: Urology | Admitting: Urology

## 2020-12-16 DIAGNOSIS — C61 Malignant neoplasm of prostate: Secondary | ICD-10-CM | POA: Diagnosis not present

## 2020-12-16 MED ORDER — PIFLIFOLASTAT F 18 (PYLARIFY) INJECTION
9.0000 | Freq: Once | INTRAVENOUS | Status: AC
  Administered 2020-12-16: 9.9 via INTRAVENOUS

## 2021-01-01 DIAGNOSIS — H26492 Other secondary cataract, left eye: Secondary | ICD-10-CM | POA: Diagnosis not present

## 2021-01-10 ENCOUNTER — Ambulatory Visit (HOSPITAL_COMMUNITY)
Admission: EM | Admit: 2021-01-10 | Discharge: 2021-01-10 | Disposition: A | Discharge status: Home / Self Care | Payer: Medicare HMO

## 2021-01-10 ENCOUNTER — Emergency Department (HOSPITAL_COMMUNITY): Payer: Medicare HMO

## 2021-01-10 ENCOUNTER — Emergency Department (HOSPITAL_BASED_OUTPATIENT_CLINIC_OR_DEPARTMENT_OTHER)
Admission: EM | Admit: 2021-01-10 | Discharge: 2021-01-10 | Disposition: A | Discharge status: Home / Self Care | Payer: Medicare HMO | Source: Home / Self Care | Attending: Emergency Medicine | Admitting: Emergency Medicine

## 2021-01-10 ENCOUNTER — Encounter (HOSPITAL_COMMUNITY): Payer: Self-pay

## 2021-01-10 ENCOUNTER — Telehealth: Payer: Self-pay | Admitting: *Deleted

## 2021-01-10 ENCOUNTER — Other Ambulatory Visit: Payer: Self-pay

## 2021-01-10 ENCOUNTER — Ambulatory Visit
Admission: RE | Admit: 2021-01-10 | Discharge: 2021-01-10 | Disposition: A | Discharge status: Home / Self Care | Payer: Medicare HMO | Source: Ambulatory Visit | Attending: Urology | Admitting: Urology

## 2021-01-10 ENCOUNTER — Emergency Department (HOSPITAL_COMMUNITY)
Admission: EM | Admit: 2021-01-10 | Discharge: 2021-01-10 | Disposition: A | Discharge status: Home / Self Care | Payer: Medicare HMO | Attending: Emergency Medicine | Admitting: Emergency Medicine

## 2021-01-10 ENCOUNTER — Encounter: Payer: Self-pay | Admitting: Urology

## 2021-01-10 DIAGNOSIS — M545 Low back pain, unspecified: Secondary | ICD-10-CM | POA: Diagnosis not present

## 2021-01-10 DIAGNOSIS — R1030 Lower abdominal pain, unspecified: Secondary | ICD-10-CM | POA: Insufficient documentation

## 2021-01-10 DIAGNOSIS — Z8546 Personal history of malignant neoplasm of prostate: Secondary | ICD-10-CM | POA: Insufficient documentation

## 2021-01-10 DIAGNOSIS — Z7901 Long term (current) use of anticoagulants: Secondary | ICD-10-CM | POA: Insufficient documentation

## 2021-01-10 DIAGNOSIS — C61 Malignant neoplasm of prostate: Secondary | ICD-10-CM | POA: Diagnosis not present

## 2021-01-10 DIAGNOSIS — M461 Sacroiliitis, not elsewhere classified: Secondary | ICD-10-CM | POA: Insufficient documentation

## 2021-01-10 DIAGNOSIS — Z79899 Other long term (current) drug therapy: Secondary | ICD-10-CM | POA: Insufficient documentation

## 2021-01-10 DIAGNOSIS — I10 Essential (primary) hypertension: Secondary | ICD-10-CM | POA: Insufficient documentation

## 2021-01-10 DIAGNOSIS — Z87891 Personal history of nicotine dependence: Secondary | ICD-10-CM | POA: Insufficient documentation

## 2021-01-10 DIAGNOSIS — Z7984 Long term (current) use of oral hypoglycemic drugs: Secondary | ICD-10-CM | POA: Insufficient documentation

## 2021-01-10 DIAGNOSIS — Z5321 Procedure and treatment not carried out due to patient leaving prior to being seen by health care provider: Secondary | ICD-10-CM | POA: Insufficient documentation

## 2021-01-10 DIAGNOSIS — E119 Type 2 diabetes mellitus without complications: Secondary | ICD-10-CM | POA: Insufficient documentation

## 2021-01-10 DIAGNOSIS — R972 Elevated prostate specific antigen [PSA]: Secondary | ICD-10-CM | POA: Diagnosis not present

## 2021-01-10 LAB — CBC WITH DIFFERENTIAL/PLATELET
Abs Immature Granulocytes: 0.01 10*3/uL (ref 0.00–0.07)
Basophils Absolute: 0 10*3/uL (ref 0.0–0.1)
Basophils Relative: 1 %
Eosinophils Absolute: 0.1 10*3/uL (ref 0.0–0.5)
Eosinophils Relative: 1 %
HCT: 40 % (ref 39.0–52.0)
Hemoglobin: 13.5 g/dL (ref 13.0–17.0)
Immature Granulocytes: 0 %
Lymphocytes Relative: 30 %
Lymphs Abs: 1.3 10*3/uL (ref 0.7–4.0)
MCH: 30.7 pg (ref 26.0–34.0)
MCHC: 33.8 g/dL (ref 30.0–36.0)
MCV: 90.9 fL (ref 80.0–100.0)
Monocytes Absolute: 0.3 10*3/uL (ref 0.1–1.0)
Monocytes Relative: 6 %
Neutro Abs: 2.7 10*3/uL (ref 1.7–7.7)
Neutrophils Relative %: 62 %
Platelets: 248 10*3/uL (ref 150–400)
RBC: 4.4 MIL/uL (ref 4.22–5.81)
RDW: 14.2 % (ref 11.5–15.5)
WBC: 4.4 10*3/uL (ref 4.0–10.5)
nRBC: 0 % (ref 0.0–0.2)

## 2021-01-10 LAB — BASIC METABOLIC PANEL
Anion gap: 9 (ref 5–15)
BUN: 23 mg/dL (ref 8–23)
CO2: 21 mmol/L — ABNORMAL LOW (ref 22–32)
Calcium: 8.9 mg/dL (ref 8.9–10.3)
Chloride: 104 mmol/L (ref 98–111)
Creatinine, Ser: 0.96 mg/dL (ref 0.61–1.24)
GFR, Estimated: >60 mL/min (ref >60)
Glucose, Bld: 147 mg/dL — ABNORMAL HIGH (ref 70–99)
Potassium: 3.9 mmol/L (ref 3.5–5.1)
Sodium: 134 mmol/L — ABNORMAL LOW (ref 135–145)

## 2021-01-10 MED ORDER — OXYCODONE-ACETAMINOPHEN 5-325 MG PO TABS
1.0000 | ORAL_TABLET | Freq: Once | ORAL | Status: AC
  Administered 2021-01-10: 1 via ORAL
  Filled 2021-01-10: qty 1

## 2021-01-10 MED ORDER — HYDROCODONE-ACETAMINOPHEN 5-325 MG PO TABS: 1.0000 | ORAL_TABLET | Freq: Four times a day (QID) | ORAL | 0 refills | Status: DC | PRN

## 2021-01-10 MED ORDER — MELOXICAM 7.5 MG PO TABS: ORAL_TABLET | ORAL | 0 refills | Status: DC

## 2021-01-10 MED ORDER — NAPROXEN 250 MG PO TABS: 500.0000 mg | ORAL_TABLET | Freq: Once | ORAL | Status: DC

## 2021-01-10 NOTE — ED Triage Notes (Signed)
Pt to ED from home with c/o back pain due to chronic spinal stenosis. Pt states he has had bouts with this before but never like this. Pt states his right leg gave out from the pain.

## 2021-01-10 NOTE — ED Provider Notes (Signed)
[facility] EMERGENC[facility] Provider Note: Georgena Spurling, MD, FACEP  CSN: [id number] MRN: [id number] ARRIVAL: 01/10/21 at 1932 ROOM: DB010/DB010   CHIEF COMPLAINT  Back Pain   HISTORY OF PRESENT ILLNESS  01/10/21 11:04 PM [name] is a 70 y.o. male who is currently undergoing treatment for prostate cancer.  He states that the cancer has not metastasized.  Yesterday evening he developed pain in his right sacroiliac joint.  This morning the pain had worsened and he now rates it as a 20 out of 10.  It radiates around to his groin.  It is aching in quality and worse with certain movements of his leg.  He has no new numbness or weakness but states his right leg did "give out" because of severe pain.  He has not recently injured it.   Past Medical History:  Diagnosis Date   Abscess of anal and rectal regions 07/15/2007   Qualifier: Diagnosis of  By: Amil Amen MD, Elizabeth     Atrial fibrillation [facility])    Diabetes mellitus without complication [facility]) Dx 3329   FISTULA, ANAL 01/27/2007   Qualifier: Diagnosis of  By: Amil Amen MD, Elizabeth     Hypertension Dx 2008   Prostate cancer [facility])    Stroke [facility])     Past Surgical History:  Procedure Laterality Date   CATARACT EXTRACTION  06/2017   CYSTOSCOPY  05/2017   INTERCOSTAL NERVE BLOCK Left 05/13/2020   Procedure: INTERCOSTAL NERVE BLOCK;  Surgeon: Lajuana Matte, MD;  Location: [facility] OR;  Service: Thoracic;  Laterality: Left;   PROSTATE BIOPSY      Family History  Problem Relation Age of Onset   Diabetes Mother    Breast cancer Mother    Diabetes Father    Breast cancer Sister    Prostate cancer Neg Hx    Colon cancer Neg Hx    Pancreatic cancer Neg Hx     Social History   Tobacco Use   Smoking status: Former    Packs/day: 0.25    Years: 5.00    Pack years: 1.25    Types: Cigarettes    Quit date: 05/25/2014    Years since quitting: 6.6   Smokeless tobacco: Never   Tobacco comments:    Quit 05/15/14  Vaping Use    Vaping Use: Never used  Substance Use Topics   Alcohol use: No    Alcohol/week: 0.0 standard drinks   Drug use: No    Prior to Admission medications   Medication Sig Start Date End Date Taking? Authorizing Provider  HYDROcodone-acetaminophen (NORCO) 5-325 MG tablet Take 1 tablet by mouth every 6 (six) hours as needed for severe pain. 01/10/21  Yes Estill Llerena, MD  meloxicam (MOBIC) 7.5 MG tablet Take 1 tablet daily for sacroiliac pain. 01/10/21  Yes Halayna Blane, MD  amLODipine (NORVASC) 10 MG tablet Take 1 tablet (10 mg total) by mouth daily. 10/28/20   [name], MD  apixaban (ELIQUIS) 5 MG TABS tablet Take 1 tablet (5 mg total) by mouth 2 (two) times daily. 05/16/20 08/14/20  Kayleen Memos, DO  apixaban (ELIQUIS) 5 MG TABS tablet TAKE 1 TABLET BY MOUTH TWICE DAILY. Patient taking differently: Take 5 mg by mouth 2 (two) times daily. 02/19/20 02/18/21  [name], MD  B Complex Vitamins (VITAMIN B COMPLEX) TABS Take 1 tablet by mouth daily.    [provider]  bisacodyl (DULCOLAX) 5 MG EC tablet Take 5 mg by mouth daily as needed (constipation).  [provider]  Blood Glucose Monitoring Suppl (TRUE METRIX METER) w/Device KIT Use as directed 05/11/16   Boykin Nearing, MD  diclofenac Sodium (VOLTAREN) 1 % GEL Apply 2 g topically 3 (three) times daily as needed (pain). 06/20/20   Charlott Rakes, MD  fluticasone (FLONASE) 50 MCG/ACT nasal spray PLACE 1 SPRAY INTO BOTH NOSTRILS DAILY AS NEEDED FOR ALLERGIES OR RHINITIS. 07/27/19 07/26/20  [name], MD  gabapentin (NEURONTIN) 100 MG capsule Take 1 capsule (100 mg total) by mouth daily at 12 noon. 05/16/20 08/14/20  Kayleen Memos, DO  GlucoCom Lancets MISC 100 Units by Does not apply route once. 03/13/15   Funches, Adriana Mccallum, MD  glucose blood (TRUE METRIX BLOOD GLUCOSE TEST) test strip Use as instructed 05/11/16   Boykin Nearing, MD  hydrOXYzine (ATARAX/VISTARIL) 25 MG tablet TAKE 1 TABLET (25 MG TOTAL)  BY MOUTH 2 (TWO) TIMES DAILY AS NEEDED. 10/28/20 10/28/21  [name], MD  metFORMIN (GLUCOPHAGE) 500 MG tablet Take 500 mg by mouth once. 05/16/20   [provider]  Multiple Vitamin (MULTIVITAMIN WITH MINERALS) TABS tablet Take 1 tablet by mouth daily.    [provider]    Allergies Lisinopril   REVIEW OF SYSTEMS  Negative except as noted here or in the History of Present Illness.   PHYSICAL EXAMINATION  Initial Vital Signs Blood pressure (!) 162/90, pulse 60, temperature 98.2 F (36.8 C), temperature source Oral, resp. rate 18, height 6' 1"  (1.854 m), weight 88 kg, SpO2 98 %.  Examination General: Well-developed, well-nourished male in no acute distress; appearance consistent with age of record HENT: normocephalic; atraumatic Eyes: Normal appearance Neck: supple Heart: regular rate and rhythm Lungs: clear to auscultation bilaterally Abdomen: soft; nondistended; nontender; bowel sounds present Back: Right SI tenderness Extremities: No acute deformity; normal range of motion except right hip limited due to pain  Neurologic: Awake, alert and oriented; motor function intact in all extremities and symmetric; no facial droop Skin: Warm and dry Psychiatric: Normal mood and affect   RESULTS  Summary of this visit's results, reviewed and interpreted by myself:   EKG Interpretation  Date/Time:    Ventricular Rate:    PR Interval:    QRS Duration:   QT Interval:    QTC Calculation:   R Axis:     Text Interpretation:         Laboratory Studies: Results for orders placed or performed during the hospital encounter of 01/10/21 (from the past 24 hour(s))  CBC with Differential     Status: None   Collection Time: 01/10/21  1:57 PM  Result Value Ref Range   WBC 4.4 4.0 - 10.5 K/uL   RBC 4.40 4.22 - 5.81 MIL/uL   Hemoglobin 13.5 13.0 - 17.0 g/dL   HCT 40.0 39.0 - 52.0 %   MCV 90.9 80.0 - 100.0 fL   MCH 30.7 26.0 - 34.0 pg   MCHC 33.8 30.0 - 36.0  g/dL   RDW 14.2 11.5 - 15.5 %   Platelets 248 150 - 400 K/uL   nRBC 0.0 0.0 - 0.2 %   Neutrophils Relative % 62 %   Neutro Abs 2.7 1.7 - 7.7 K/uL   Lymphocytes Relative 30 %   Lymphs Abs 1.3 0.7 - 4.0 K/uL   Monocytes Relative 6 %   Monocytes Absolute 0.3 0.1 - 1.0 K/uL   Eosinophils Relative 1 %   Eosinophils Absolute 0.1 0.0 - 0.5 K/uL   Basophils Relative 1 %  Basophils Absolute 0.0 0.0 - 0.1 K/uL   Immature Granulocytes 0 %   Abs Immature Granulocytes 0.01 0.00 - 0.07 K/uL  Basic metabolic panel     Status: Abnormal   Collection Time: 01/10/21  1:57 PM  Result Value Ref Range   Sodium 134 (L) 135 - 145 mmol/L   Potassium 3.9 3.5 - 5.1 mmol/L   Chloride 104 98 - 111 mmol/L   CO2 21 (L) 22 - 32 mmol/L   Glucose, Bld 147 (H) 70 - 99 mg/dL   BUN 23 8 - 23 mg/dL   Creatinine, Ser 0.96 0.61 - 1.24 mg/dL   Calcium 8.9 8.9 - 10.3 mg/dL   GFR, Estimated >60 >60 mL/min   Anion gap 9 5 - 15   Imaging Studies: No results found.  ED COURSE and MDM  Nursing notes, initial and subsequent vitals signs, including pulse oximetry, reviewed and interpreted by myself.  Vitals:   01/10/21 2006 01/10/21 2008  BP: (!) 162/90   Pulse: 60   Resp: 18   Temp: 98.2 F (36.8 C)   TempSrc: Oral   SpO2: 98%   Weight:  88 kg  Height:  6' 1"  (1.854 m)   Medications  oxyCODONE-acetaminophen (PERCOCET/ROXICET) 5-325 MG per tablet 1 tablet (has no administration in time range)    Presentation consistent with acute sacroiliitis.  An MRI was ordered at [facility] long ED earlier today but he left without being seen after an approximately 6.5-hour wait.  The order is still pending and we will try to arrange for him to follow-up as an outpatient.  PROCEDURES  Procedures   ED DIAGNOSES     ICD-10-CM   1. Sacroiliitis ([place])  M46.1          Fernanda Twaddell, MD 01/10/21 [phone]

## 2021-01-10 NOTE — ED Triage Notes (Signed)
Patient states he has right groin pain that radiates into his right lower back since this AM.  Patient denies any urinary issues or swelling of his scrotum.

## 2021-01-10 NOTE — ED Provider Notes (Signed)
Emergency Medicine Provider Triage Evaluation Note  [name] , a 70 y.o. male  was evaluated in triage.  Pt complains of right lower back pain. Hx of spinal stenosis. Pain worse with movement. Radiates into his right inguinal crease and down leg. No bowel or bladder incontinence. Feels numb to right GU region and inguinal crease. Known prostate cancer  Review of Systems  Positive: Right lower back pain, numbness Negative: Fever, chills, emesis  Physical Exam  BP 140/72 (BP Location: Left Arm)   Pulse 62   Temp 98 F (36.7 C) (Oral)   Resp 18   Ht '6\' 1"'$  (1.854 m)   Wt 88 kg   SpO2 97%   BMI 25.60 kg/m  Gen:   Awake, no distress   Resp:  Normal effort  MSK:   Moves extremities without difficulty, diffuse tenderness to right lower back Other:    Medical Decision Making  Medically screening exam initiated at 1:44 PM.  Appropriate orders placed.  [name] was informed that the remainder of the evaluation will be completed by another provider, this initial triage assessment does not replace that evaluation, and the importance of remaining in the ED until their evaluation is complete.  Right lower back pain  Subjective numbness to right GU region, known CA. Will obtain MR   Nettie Elm, PA-C 01/10/21 1346    Regan Lemming, MD 01/11/21 1447

## 2021-01-10 NOTE — Progress Notes (Signed)
AUA score is 4. Patient states he has intermittent pain from his back due to spinal stenosis. He takes tramadol for this which generally relieves the pain to a tolerable level. Patient has not had radiation before and he does take methotrexate.

## 2021-01-10 NOTE — Progress Notes (Signed)
Radiation Oncology         [phone] ________________________________  Outpatient Re-Consultation - Conducted via telephone due to current COVID-19 concerns for limiting patient exposure  Name: [name] MRN: [id number]  Date: 01/10/2021  DOB: 1950-06-30  OI:[id number], Dalbert Batman, MD  Festus Aloe, MD   REFERRING PHYSICIAN: Festus Aloe, MD  DIAGNOSIS: 70 y.o. gentleman with Stage T1c adenocarcinoma of the prostate with Gleason score of 4+3, and PSA of 33.50.    ICD-10-CM   1. Malignant neoplasm of prostate ([place])  C61       HISTORY OF PRESENT ILLNESS: [name] is a 70 y.o. male with a diagnosis of prostate cancer.  We initially met by telephone in April 2020 to discuss treatment options for his newly diagnosed prostate cancer at that time.   In brief summary, he had an office cystoscopy and a CT abdomen in November 2019 both of which were negative but during his work-up he was incidentally noted to have an elevated PSA at 15.8 on November 6.  Repeat PSA on 05/10/2018 was further elevated at 16.4 and remained elevated at 15.2 when repeated on May 31, 2018.   Digital rectal exam was performed at that time and did not reveal any concerning prostate nodules.  The patient proceeded to transrectal ultrasound with 12 biopsies of the prostate on 07/29/2018.  The prostate volume measured 33 cc.  Out of 12 core biopsies, 7 were positive.  The maximum Gleason score was 4+3, and this was seen in right apex and right apex lateral.  Additionally, Gleason 3+4 disease was seen in right mid, right base lateral, and right mid lateral and Gleason 3+3 disease was seen in right base and left mid lateral.  A CT A/P was performed on 08/15/2018 for disease staging and showed no findings to suggest nodal or solid organ metastasis. A bone scan performed the same day showed no scintigraphic evidence of skeletal metastasis.  When we met with him initially, the patient had decided that he  wanted to proceed with prostatectomy but unfortunately did not follow through with surgery and had not continued to follow with urology since May 2020.  He did follow-up with Dr. Junious Silk on 11/08/2020 and digital rectal exam remained normal but a repeat PSA that day had increased to 33.50.  He had a PSMA PET scan to restage his disease on 12/16/2020 and this shows no evidence of metastatic disease.  He is no longer interested in prostatectomy.  The patient reviewed the biopsy, PSA and PSMA scan results with his urologist and he has kindly been referred back to [place] today for further discussion of potential radiation treatment options.  PREVIOUS RADIATION THERAPY: No  PAST MEDICAL HISTORY:  Past Medical History:  Diagnosis Date   Abscess of anal and rectal regions 07/15/2007   Qualifier: Diagnosis of  By: Amil Amen MD, Elizabeth     Atrial fibrillation [facility])    Diabetes mellitus without complication ([place]) Dx 9169   FISTULA, ANAL 01/27/2007   Qualifier: Diagnosis of  By: Amil Amen MD, Elizabeth     Hypertension Dx 2008   Prostate cancer [facility])    Stroke [facility])       PAST SURGICAL HISTORY: Past Surgical History:  Procedure Laterality Date   CATARACT EXTRACTION  06/2017   CYSTOSCOPY  05/2017   INTERCOSTAL NERVE BLOCK Left 05/13/2020   Procedure: INTERCOSTAL NERVE BLOCK;  Surgeon: Lajuana Matte, MD;  Location: [facility];  Service: Thoracic;  Laterality: Left;   PROSTATE  BIOPSY      FAMILY HISTORY:  Family History  Problem Relation Age of Onset   Diabetes Mother    Breast cancer Mother    Diabetes Father    Breast cancer Sister    Prostate cancer Neg Hx    Colon cancer Neg Hx    Pancreatic cancer Neg Hx     SOCIAL HISTORY:  Social History   Socioeconomic History   Marital status: Married    Spouse name: Not on file   Number of children: 5   Years of education: Not on file   Highest education level: Not on file  Occupational History   Occupation: Caregiver    Comment: retired   Tobacco Use   Smoking status: Former    Packs/day: 0.25    Years: 5.00    Pack years: 1.25    Types: Cigarettes    Quit date: 05/25/2014    Years since quitting: 6.6   Smokeless tobacco: Never   Tobacco comments:    Quit 05/15/14  Vaping Use   Vaping Use: Never used  Substance and Sexual Activity   Alcohol use: No    Alcohol/week: 0.0 standard drinks   Drug use: No   Sexual activity: Not Currently  Other Topics Concern   Not on file  Social History Narrative   Lives with his wife   5 children, grown.   7 grandchildren.    Social Determinants of Health   Financial Resource Strain: Not on file  Food Insecurity: Not on file  Transportation Needs: Not on file  Physical Activity: Not on file  Stress: Not on file  Social Connections: Not on file  Intimate Partner Violence: Not on file    ALLERGIES: Lisinopril  MEDICATIONS:  Current Outpatient Medications  Medication Sig Dispense Refill   apixaban (ELIQUIS) 5 MG TABS tablet TAKE 1 TABLET BY MOUTH TWICE DAILY. (Patient taking differently: Take 5 mg by mouth 2 (two) times daily.) 60 tablet 2   B Complex Vitamins (VITAMIN B COMPLEX) TABS Take 1 tablet by mouth daily.     bisacodyl (DULCOLAX) 5 MG EC tablet Take 5 mg by mouth daily as needed (constipation).     Blood Glucose Monitoring Suppl (TRUE METRIX METER) w/Device KIT Use as directed 1 kit 0   diclofenac Sodium (VOLTAREN) 1 % GEL Apply 2 g topically 3 (three) times daily as needed (pain). 100 g 1   GlucoCom Lancets MISC 100 Units by Does not apply route once. 100 each 0   glucose blood (TRUE METRIX BLOOD GLUCOSE TEST) test strip Use as instructed 100 each 12   HYDROcodone-acetaminophen (NORCO) 10-325 MG tablet Take 1 tablet by mouth daily as needed (pain).     hydrOXYzine (ATARAX/VISTARIL) 25 MG tablet TAKE 1 TABLET (25 MG TOTAL) BY MOUTH 2 (TWO) TIMES DAILY AS NEEDED. 30 tablet 2   metFORMIN (GLUCOPHAGE) 500 MG tablet Take 500 mg by mouth once.     Multiple Vitamin  (MULTIVITAMIN WITH MINERALS) TABS tablet Take 1 tablet by mouth daily.     traMADol (ULTRAM) 50 MG tablet Take by mouth every 6 (six) hours as needed.     amLODipine (NORVASC) 10 MG tablet Take 1 tablet (10 mg total) by mouth daily. 90 tablet 1   apixaban (ELIQUIS) 5 MG TABS tablet Take 1 tablet (5 mg total) by mouth 2 (two) times daily. 180 tablet 0   fluticasone (FLONASE) 50 MCG/ACT nasal spray PLACE 1 SPRAY INTO BOTH NOSTRILS DAILY AS NEEDED FOR ALLERGIES  OR RHINITIS. 16 g 1   gabapentin (NEURONTIN) 100 MG capsule Take 1 capsule (100 mg total) by mouth daily at 12 noon. 90 capsule 0   latanoprost (XALATAN) 0.005 % ophthalmic solution Place 1 drop into both eyes nightly. (Patient not taking: Reported on 01/10/2021) 2.5 mL 3   nicotine (NICODERM CQ - DOSED IN MG/24 HR) 7 mg/24hr patch Place 1 patch (7 mg total) onto the skin daily. (Patient not taking: Reported on 01/10/2021) 28 patch 1   No current facility-administered medications for this encounter.    REVIEW OF SYSTEMS:  On review of systems, the patient reports that he is doing well overall. He denies any chest pain, shortness of breath, cough, fevers, chills, night sweats, unintended weight changes. He denies any bowel disturbances, and denies abdominal pain, nausea or vomiting. He denies any new musculoskeletal or joint aches or pains. His IPSS was 4, indicating mild urinary symptoms. His SHIM was 1, indicating he has severe erectile dysfunction. A complete review of systems is obtained and is otherwise negative.    PHYSICAL EXAM:  Wt Readings from Last 3 Encounters:  11/29/20 191 lb 3.2 oz (86.7 kg)  10/28/20 192 lb (87.1 kg)  07/26/20 192 lb 3.2 oz (87.2 kg)   Temp Readings from Last 3 Encounters:  05/27/20 98.2 F (36.8 C)  05/24/20 (!) 97.2 F (36.2 C)  05/16/20 98.1 F (36.7 C) (Oral)   BP Readings from Last 3 Encounters:  11/29/20 (!) 160/84  10/28/20 136/74  07/26/20 (!) 150/94   Pulse Readings from Last 3 Encounters:   11/29/20 85  10/28/20 94  07/26/20 (!) 110   Pain Assessment Pain Score: 7  Pain Frequency: Intermittent Pain Loc: Back/10  Not performed due to telephone re-consult visit format.   KPS = 90  100 - Normal; no complaints; no evidence of disease. 90   - Able to carry on normal activity; minor signs or symptoms of disease. 80   - Normal activity with effort; some signs or symptoms of disease. 68   - Cares for self; unable to carry on normal activity or to do active work. 60   - Requires occasional assistance, but is able to care for most of his personal needs. 50   - Requires considerable assistance and frequent medical care. 16   - Disabled; requires special care and assistance. 59   - Severely disabled; hospital admission is indicated although death not imminent. 17   - Very sick; hospital admission necessary; active supportive treatment necessary. 10   - Moribund; fatal processes progressing rapidly. 0     - Dead  Karnofsky DA, Abelmann [place], Craver LS and Burchenal [facility] [phone]) The use of the nitrogen mustards in the palliative treatment of carcinoma: with particular reference to bronchogenic carcinoma Cancer 1 634-56  LABORATORY DATA:  Lab Results  Component Value Date   WBC 7.9 05/15/2020   HGB 12.9 (L) 05/15/2020   HCT 38.6 (L) 05/15/2020   MCV 89.6 05/15/2020   PLT 226 05/15/2020   Lab Results  Component Value Date   NA 139 05/27/2020   K 4.7 05/27/2020   CL 102 05/27/2020   CO2 24 05/27/2020   Lab Results  Component Value Date   ALT 41 05/15/2020   AST 47 (H) 05/15/2020   ALKPHOS 48 05/15/2020   BILITOT 1.0 05/15/2020     RADIOGRAPHY: NM PET (PSMA) SKULL TO MID THIGH  Result Date: 12/16/2020 CLINICAL DATA:  Prostate carcinoma with biochemical recurrence. PSA equal 33.5.  EXAM: NUCLEAR MEDICINE PET SKULL BASE TO THIGH TECHNIQUE: 9.9 mCi F18 Piflufolastat (Pylarify) was injected intravenously. Full-ring PET imaging was performed from the skull base to thigh after  the radiotracer. CT data was obtained and used for attenuation correction and anatomic localization. COMPARISON:  CT 08/15/2018 FINDINGS: NECK No radiotracer activity in neck lymph nodes. Incidental CT finding: None CHEST No radiotracer accumulation within mediastinal or hilar lymph nodes. No suspicious pulmonary nodules on the CT scan. Incidental CT finding: None ABDOMEN/PELVIS Prostate: Intense radiotracer activity involving the near entirety of the RIGHT lobe of the prostate gland with SUV max equal 49. Lymph nodes: No abnormal radiotracer accumulation within pelvic or abdominal nodes. Liver: No evidence of liver metastasis Incidental CT finding: None SKELETON No focal  activity to suggest skeletal metastasis. IMPRESSION: 1. Intense activity involving the near entirety of the RIGHT lobe of the prostate gland consistent with residual/recurrent prostate carcinoma. 2. No evidence of metastatic adenopathy in the pelvis or periaortic retroperitoneum. 3. No evidence skeletal metastasis or visceral metastasis. Electronically Signed   By: Suzy Bouchard M.D.   On: 12/16/2020 17:11      IMPRESSION/PLAN: This visit was conducted via telephone to spare the patient unnecessary potential exposure in the healthcare setting during the current COVID-19 pandemic.   1. 70 y.o. gentleman with Stage T1c adenocarcinoma of the prostate with Gleason Score of 4+3, and PSA of 15.2. Today, we discussed the patient's workup and outlined the nature of prostate cancer in this setting. The patient's T stage, Gleason's score, and PSA put him into the high risk group. Accordingly, he is eligible for a variety of potential treatment options including prostatectomy or ADT in combination with either 8 weeks of external radiation or an upfront seed boost procedure followed by 5 weeks of daily radiation. We discussed and outlined the risks, benefits, short and long-term effects associated with radiotherapy and compared and contrasted these  with prostatectomy. We discussed the role of SpaceOAR gel in reducing the rectal toxicity associated with radiotherapy. We also detailed the role of ADT in the treatment of high risk prostate cancer and outlined the associated side effects that could be expected with this therapy. He was encouraged to ask questions that were answered to his stated satisfaction.  At the conclusion of the conversation, the patient elects to proceed with 8 weeks of external beam therapy in combination with ADT. We will share our discussion with Dr. Junious Silk and make arrangements for a follow up visit, first available to start ADT now.  We will also coordinate for fiducial markers and SpaceOAR gel placement, in late September 2022, prior to simulation, to reduce rectal toxicity from radiotherapy. The patient appears to have a good understanding of his disease and our treatment recommendations which are of curative intent and is in agreement with the stated plan.  Therefore, we will move forward with treatment planning accordingly, in anticipation of beginning IMRT approximately 2 months after starting ADT.  We enjoyed meeting with him today, and look forward to continuing to participate in the care of this very nice gentleman.   Given current concerns for patient exposure during the COVID-19 pandemic, this encounter was conducted via telephone. The patient was notified in advance and was offered a [facility] meeting to allow for face to face communication but unfortunately reported that he did not have the appropriate resources/technology to support such a visit and instead preferred to proceed with telephone consult. The attendants for this meeting include Tyler Pita MD, [address] PA-C,  and patient, Bradie Crumble. During the encounter, Tyler Pita, MD and Freeman Caldron PA-C, were located at [facility] Radiation Oncology Department.  Patient Samarion Howes was located at home.   We personally  spent 60 minutes in this encounter including chart review, reviewing radiological studies, meeting face-to-face with the patient, entering orders and completing documentation.    Nicholos Johns, MMS, PA-C    Tyler Pita, MD  [place] [facility] [place]: [phone]  Fax: [phone] [email or web address]  Skype  [email or web address]  This document serves as a record of services personally performed by [employer], PA-C. It was created on her behalf by Wilburn Mylar, a trained medical scribe. The creation of this record is based on the scribe's personal observations and the provider's statements to them. This document has been checked and approved by the attending provider.

## 2021-01-10 NOTE — Telephone Encounter (Signed)
CALLED PATIENT TO INFORM OF ADT APPT. FOR 01-15-21- ARRIVAL TIME- 9:45 AM @ DR. Lyndal Rainbow OFFICE, SPOKE WITH PATIENT AND HE IS AWARE OF THIS APPT.

## 2021-01-13 ENCOUNTER — Telehealth: Payer: Self-pay | Admitting: Internal Medicine

## 2021-01-13 NOTE — Telephone Encounter (Signed)
Pt is calling to speak to [facility] regarding scheduling an MRI after following up from the hospital. Also wanting to know the Right Leg is still numb from last week. Pt went to Emergency Room on Friday. Please advise CB- [phone]

## 2021-01-14 ENCOUNTER — Emergency Department (HOSPITAL_COMMUNITY): Payer: Medicare HMO

## 2021-01-14 ENCOUNTER — Other Ambulatory Visit: Payer: Self-pay

## 2021-01-14 ENCOUNTER — Encounter: Payer: Self-pay | Admitting: Internal Medicine

## 2021-01-14 ENCOUNTER — Emergency Department (HOSPITAL_BASED_OUTPATIENT_CLINIC_OR_DEPARTMENT_OTHER)
Admission: EM | Admit: 2021-01-14 | Discharge: 2021-01-14 | Disposition: A | Discharge status: Home / Self Care | Payer: Medicare HMO | Attending: Emergency Medicine | Admitting: Emergency Medicine

## 2021-01-14 ENCOUNTER — Encounter (HOSPITAL_BASED_OUTPATIENT_CLINIC_OR_DEPARTMENT_OTHER): Payer: Self-pay

## 2021-01-14 ENCOUNTER — Ambulatory Visit: Payer: Medicare HMO | Attending: Internal Medicine | Admitting: Internal Medicine

## 2021-01-14 VITALS — BP 176/98 | HR 85 | Resp 16

## 2021-01-14 DIAGNOSIS — E119 Type 2 diabetes mellitus without complications: Secondary | ICD-10-CM | POA: Insufficient documentation

## 2021-01-14 DIAGNOSIS — Z8546 Personal history of malignant neoplasm of prostate: Secondary | ICD-10-CM | POA: Diagnosis not present

## 2021-01-14 DIAGNOSIS — M5441 Lumbago with sciatica, right side: Secondary | ICD-10-CM | POA: Diagnosis not present

## 2021-01-14 DIAGNOSIS — I10 Essential (primary) hypertension: Secondary | ICD-10-CM | POA: Insufficient documentation

## 2021-01-14 DIAGNOSIS — M5416 Radiculopathy, lumbar region: Secondary | ICD-10-CM | POA: Diagnosis not present

## 2021-01-14 DIAGNOSIS — R200 Anesthesia of skin: Secondary | ICD-10-CM

## 2021-01-14 DIAGNOSIS — M545 Low back pain, unspecified: Secondary | ICD-10-CM | POA: Diagnosis present

## 2021-01-14 DIAGNOSIS — Z87891 Personal history of nicotine dependence: Secondary | ICD-10-CM | POA: Diagnosis not present

## 2021-01-14 MED ORDER — HYDROCODONE-ACETAMINOPHEN 5-325 MG PO TABS: 1.0000 | ORAL_TABLET | Freq: Three times a day (TID) | ORAL | 0 refills | Status: DC | PRN

## 2021-01-14 NOTE — ED Notes (Signed)
Patient transported to MRI 

## 2021-01-14 NOTE — ED Provider Notes (Signed)
[facility] Emergency Department Provider Note MRN:  [id number]  Arrival date & time: 01/14/21     Chief Complaint   Back Pain   History of Present Illness   [name] is a 70 y.o. year-old male with a history of prostate cancer, stroke, diabetes presenting to the ED with chief complaint of back pain.  Worsening back pain over the past 2 days.  Stumbled and fell the day prior and had some mild back pain.  Becoming severe, 10 out of 10, located in the right lower back with radiation down the back of the leg.  Noticing worsening numbness to the medial aspect of the right leg, cannot feel it.  Denies any weakness, no fever, no bowel or bladder dysfunction, no abdominal pain, no chest pain, no other complaints.  Review of Systems  A complete 10 system review of systems was obtained and all systems are negative except as noted in the HPI and PMH.   Patient's Health History    Past Medical History:  Diagnosis Date   Abscess of anal and rectal regions 07/15/2007   Qualifier: Diagnosis of  By: Amil Amen MD, Elizabeth     Atrial fibrillation [facility])    Diabetes mellitus without complication [facility]) Dx [id number]   FISTULA, ANAL 01/27/2007   Qualifier: Diagnosis of  By: Amil Amen MD, Elizabeth     Hypertension Dx 2008   Prostate cancer [facility])    Stroke [facility])     Past Surgical History:  Procedure Laterality Date   CATARACT EXTRACTION  06/2017   CYSTOSCOPY  05/2017   INTERCOSTAL NERVE BLOCK Left 05/13/2020   Procedure: INTERCOSTAL NERVE BLOCK;  Surgeon: Lajuana Matte, MD;  Location: [facility] OR;  Service: Thoracic;  Laterality: Left;   PROSTATE BIOPSY      Family History  Problem Relation Age of Onset   Diabetes Mother    Breast cancer Mother    Diabetes Father    Breast cancer Sister    Prostate cancer Neg Hx    Colon cancer Neg Hx    Pancreatic cancer Neg Hx     Social History   Socioeconomic History   Marital status: Married    Spouse name: Not on file    Number of children: 5   Years of education: Not on file   Highest education level: Not on file  Occupational History   Occupation: Caregiver    Comment: retired  Tobacco Use   Smoking status: Former    Packs/day: 0.25    Years: 5.00    Pack years: 1.25    Types: Cigarettes    Quit date: 05/25/2014    Years since quitting: 6.6   Smokeless tobacco: Never   Tobacco comments:    Quit 05/15/14  Vaping Use   Vaping Use: Never used  Substance and Sexual Activity   Alcohol use: No    Alcohol/week: 0.0 standard drinks   Drug use: No   Sexual activity: Not Currently  Other Topics Concern   Not on file  Social History Narrative   Lives with his wife   5 children, grown.   7 grandchildren.    Social Determinants of Health   Financial Resource Strain: Not on file  Food Insecurity: Not on file  Transportation Needs: Not on file  Physical Activity: Not on file  Stress: Not on file  Social Connections: Not on file  Intimate Partner Violence: Not on file     Physical Exam   Vitals:  01/14/21 0336  BP: (!) 156/98  Pulse: 78  Resp: 16  Temp: 98.6 F (37 C)  SpO2: 98%    CONSTITUTIONAL: Well-appearing, NAD NEURO:  Alert and oriented x 3, preserved strength of the legs bilaterally, decreased sensation to the medial aspect of the right lower leg EYES:  eyes equal and reactive ENT/NECK:  no LAD, no JVD CARDIO: Regular rate, well-perfused, normal S1 and S2 PULM:  CTAB no wheezing or rhonchi GI/GU:  normal bowel sounds, non-distended, non-tender MSK/SPINE:  No gross deformities, no edema SKIN:  no rash, atraumatic PSYCH:  Appropriate speech and behavior  *Additional and/or pertinent findings included in MDM below  Diagnostic and Interventional Summary    EKG Interpretation  Date/Time:    Ventricular Rate:    PR Interval:    QRS Duration:   QT Interval:    QTC Calculation:   R Axis:     Text Interpretation:         Labs Reviewed - No data to display  MR  LUMBAR SPINE WO CONTRAST    (Results Pending)    Medications - No data to display   Procedures  /  Critical Care Procedures  ED Course and Medical Decision Making  I have reviewed the triage vital signs, the nursing notes, and pertinent available records from the EMR.  Listed above are laboratory and imaging tests that I personally ordered, reviewed, and interpreted and then considered in my medical decision making (see below for details).  History prostate cancer, presenting with a sciatic type back pain but with progressive neurological deficit.  Needs MRI to exclude myelopathy.  Accepted for transfer to [facility] emergency department, Dr. Stark Jock accepting.       Barth Kirks. Sedonia Small, [place] [email or web address]  Final Clinical Impressions(s) / ED Diagnoses     ICD-10-CM   1. Acute right-sided low back pain with right-sided sciatica  M54.41     2. Leg numbness  R20.0       ED Discharge Orders     None        Discharge Instructions Discussed with and Provided to Patient:    Discharge Instructions      Go directly to [facility] emergency department.  Your MRI of your back is ordered.       Maudie Flakes, MD 01/14/21 [phone]

## 2021-01-14 NOTE — ED Notes (Signed)
Pt refused vitals. When approached and asked if I could take pt's vitals, he responded "No. I am about to leave."

## 2021-01-14 NOTE — ED Notes (Signed)
Patient to [facility] from [place] for MRI, full set of VS obtained and MRI aware patient has arrived

## 2021-01-14 NOTE — Progress Notes (Signed)
2nd attempt at MRI for this pt. Pt states cannot lay flat for exam. Tech spoke to pt and tried all measures to get pt in comfortable position for scan. Pt still states cannot stay flat for exam. RN aware.

## 2021-01-14 NOTE — Telephone Encounter (Signed)
Noted.   Pt was seen by pcp today 01/14/21

## 2021-01-14 NOTE — Progress Notes (Signed)
MRI attempted.  Pt in too much pain to lay flat and hold still.  Pt has not yet been seen by MD and was advised to send pt back to ER at this time.  Please call MRI when pt able to tolerate the exam.

## 2021-01-14 NOTE — ED Notes (Signed)
Called report to [facility] ED charge - pt was informed  about 17 hrs waiting time - pt was transferred to  [facility] ED via POV - pt is stable

## 2021-01-14 NOTE — ED Triage Notes (Signed)
Patient here POV from home with Lower Back Pain and R. Leg Pain.  Patient was here for Lower Back Pain 3 days PTA but since has had no Relief with prescribed Medications.   Patient endorses new Leg Pain that radiates from Lower Back to R. Foot.  BIB Wheelchair. NAD Noted. GCS 15.

## 2021-01-14 NOTE — Patient Instructions (Signed)
I recommend that you return to the ER to get MRI of lumbar spine done based on your symptoms.   Once discharge from the ER, you should not do any lifting, bending, pushing or pulling.  Use a heating pad to the back.

## 2021-01-14 NOTE — Progress Notes (Signed)
Patient ID: [name], male    DOB: [birth year]  MRN: [id number]  CC: Leg Pain   Subjective: [name] is a 70 y.o. male who presents for UC visit His concerns today include:  Pt with hx of HTN, DM, CKD 2, CVA with Lt sided weakness 04/2014, a.fib on DOAG, HL, prostate CA. cocaine abuse in remission since 2005, LT pneumothorax.   Patient presents today complaining of pain in the right lower back that started 01/09/2021.  There was no initiating factors.  The pain radiated down the right leg into the toes and into the right groin.  Pain was severe and feels like lightning pain.  It is associated with weakness in the leg.  He fell the day after the pain started when trying to get out of bed.  Subsequently seen in the emergency room.  Waited for 6 hours and then left and went to the emergency room on [address].  Diagnosed with sacroiliitis and discharged with hydrocodone.  Most of the time the pain has been 8 out of 10.  When he takes the hydrocodone every 6-8 hours, it knocks it down to about a 6.    Early this morning the pain increased so he went back to the emergency room.  He was transferred to radiology department at [facility] to have an MRI done.  However when he got there, the tech had to partially elevate his right leg on a block for the MRI study to be done.  This intensified his pain.  He took 2 hydrocodone's and then an additional 2 about an hour later but still was not able to lie flat with the right knee partially elevated on the block.  He was told that he would need to be seen by the ER physician to be given pain medication to allow for the study to be done.  He left the ER and decided to come here.   The pain is still radiating to the right groin and down the right leg.  He now has numbness in the lower inner aspect of the right leg from the knee down.  He reports an episode of urinary incontinence and endorses saddle anesthesia.  It hurts to walk.  He has been using a  wheelchair that he purchased over the weekend.  He lives alone.  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   Unintended weight loss 01/29/2020   Pain due to onychomycosis of toenails of both feet 08/01/2019   Porokeratosis 08/01/2019   Influenza vaccine needed 02/24/2019   Diabetes mellitus ([place]) 02/24/2019   Malignant neoplasm of prostate ([place]) 09/23/2018   Hyperlipemia 11/24/2016   History of CVA with residual deficit 11/24/2016   GERD (gastroesophageal reflux disease) 08/09/2014   Atrial fibrillation ([place])    Hemiparesis affecting left side as late effect of stroke ([place]) 05/01/2014   ERECTILE DYSFUNCTION, 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 90 tablet 1   apixaban (ELIQUIS) 5 MG TABS tablet Take 1 tablet (5 mg total) by mouth 2 (two) times daily. 180 tablet 0   apixaban (ELIQUIS) 5 MG TABS tablet TAKE 1 TABLET BY MOUTH TWICE DAILY. (Patient taking differently: Take 5 mg by mouth 2 (two) times daily.) 60 tablet 2   B Complex Vitamins (VITAMIN B COMPLEX) TABS Take 1 tablet by mouth daily.     bisacodyl (DULCOLAX) 5 MG EC tablet Take 5 mg by mouth daily as needed  (constipation).     Blood Glucose Monitoring Suppl (TRUE METRIX METER) w/Device KIT Use as directed 1 kit 0   diclofenac Sodium (VOLTAREN) 1 % GEL Apply 2 g topically 3 (three) times daily as needed (pain). 100 g 1   fluticasone (FLONASE) 50 MCG/ACT nasal spray PLACE 1 SPRAY INTO BOTH NOSTRILS DAILY AS NEEDED FOR ALLERGIES OR RHINITIS. 16 g 1   gabapentin (NEURONTIN) 100 MG capsule Take 1 capsule (100 mg total) by mouth daily at 12 noon. 90 capsule 0   GlucoCom Lancets MISC 100 Units by Does not apply route once. 100 each 0   glucose blood (TRUE METRIX BLOOD GLUCOSE TEST) test strip Use as instructed 100 each 12   HYDROcodone-acetaminophen (NORCO) 5-325 MG tablet Take 1 tablet by mouth every 6 (six) hours as needed for severe pain. 20 tablet 0   hydrOXYzine (ATARAX/VISTARIL) 25 MG tablet TAKE 1 TABLET (25 MG TOTAL) BY MOUTH 2 (TWO) TIMES DAILY AS NEEDED. 30 tablet 2   meloxicam (MOBIC) 7.5 MG tablet Take 1 tablet daily for sacroiliac pain. 15 tablet 0   metFORMIN (GLUCOPHAGE) 500 MG tablet Take 500 mg by mouth once.     Multiple Vitamin (MULTIVITAMIN WITH MINERALS) TABS tablet Take 1 tablet by mouth daily.     No current facility-administered medications on file prior to visit.    Allergies  Allergen Reactions   Lisinopril Swelling    Throat swelling    Social History   Socioeconomic History   Marital status: Married    Spouse name: Not on file   Number of children: 5   Years of education: Not on file   Highest education level: Not on file  Occupational History   Occupation: Caregiver    Comment: retired  Tobacco Use   Smoking status: Former    Packs/day: 0.25    Years: 5.00    Pack years: 1.25    Types: Cigarettes    Quit date: 05/25/2014    Years since quitting: 6.6   Smokeless tobacco: Never   Tobacco comments:    Quit 05/15/14  Vaping Use   Vaping Use: Never used  Substance and Sexual Activity   Alcohol use: No    Alcohol/week: 0.0 standard drinks   Drug use: No    Sexual activity: Not Currently  Other Topics Concern   Not on file  Social History Narrative   Lives with his wife   5 children, grown.   7 grandchildren.    Social Determinants of Health   Financial Resource Strain: Not on file  Food Insecurity: Not on file  Transportation Needs: Not on file  Physical Activity: Not on file  Stress: Not on file  Social Connections: Not on file  Intimate Partner Violence: Not on file  Family History  Problem Relation Age of Onset   Diabetes Mother    Breast cancer Mother    Diabetes Father    Breast cancer Sister    Prostate cancer Neg Hx    Colon cancer Neg Hx    Pancreatic cancer Neg Hx     Past Surgical History:  Procedure Laterality Date   CATARACT EXTRACTION  06/2017   CYSTOSCOPY  05/2017   INTERCOSTAL NERVE BLOCK Left 05/13/2020   Procedure: INTERCOSTAL NERVE BLOCK;  Surgeon: Lajuana Matte, MD;  Location: [facility] OR;  Service: Thoracic;  Laterality: Left;   PROSTATE BIOPSY      ROS: Review of Systems Negative except as stated above  PHYSICAL EXAM: BP (!) 176/98   Pulse 85   Resp 16   SpO2 98%   Physical Exam  General appearance - alert, well appearing, older African-American male and in no distress.  Patient sitting in a wheelchair. Mental status - normal mood, behavior, speech, dress, motor activity, and thought processes Neurological -patient examined in the wheelchair as he states it was too painful to stand up to get on the exam table.  Power in the lower extremities 5/5 proximally and distally on the left.  4/5 proximally and distally on the right.  Decreased gross sensation on the medial aspect of the right lower leg below the knee.  CMP Latest Ref Rng & Units 01/10/2021 05/27/2020 05/15/2020  Glucose 70 - 99 mg/dL 147(H) 174(H) 133(H)  BUN 8 - 23 mg/dL _0 Creatinine 0.61 - 1.24 mg/dL 0.96 1.16 1.39(H)  Sodium 135 - 145 mmol/L 134(L) 139 136  Potassium 3.5 - 5.1 mmol/L 3.9 4.7 3.9  Chloride 98 - 111  mmol/L 104 102 104  CO2 22 - 32 mmol/L 21(L) 24 24  Calcium 8.9 - 10.3 mg/dL 8.9 9.3 8.7(L)  Total Protein 6.5 - 8.1 g/dL - - 6.0(L)  Total Bilirubin 0.3 - 1.2 mg/dL - - 1.0  Alkaline Phos 38 - 126 U/L - - 48  AST 15 - 41 U/L - - 47(H)  ALT 0 - 44 U/L - - 41   Lipid Panel     Component Value Date/Time   CHOL 189 01/29/2020 1022   TRIG 123 01/29/2020 1022   HDL 58 01/29/2020 1022   CHOLHDL 3.3 01/29/2020 1022   CHOLHDL 4.2 05/02/2014 0000   VLDL 72 (H) 05/02/2014 0000   LDLCALC 109 (H) 01/29/2020 1022    CBC    Component Value Date/Time   WBC 4.4 01/10/2021 1357   RBC 4.40 01/10/2021 1357   HGB 13.5 01/10/2021 1357   HGB 13.8 01/29/2020 1022   HCT 40.0 01/10/2021 1357   HCT 40.6 01/29/2020 1022   PLT 248 01/10/2021 1357   PLT 268 01/29/2020 1022   MCV 90.9 01/10/2021 1357   MCV 89 01/29/2020 1022   MCH 30.7 01/10/2021 1357   MCHC 33.8 01/10/2021 1357   RDW 14.2 01/10/2021 1357   RDW 13.8 01/29/2020 1022   LYMPHSABS 1.3 01/10/2021 1357   MONOABS 0.3 01/10/2021 1357   EOSABS 0.1 01/10/2021 1357   BASOSABS 0.0 01/10/2021 1357    ASSESSMENT AND PLAN:  1. Lumbar radiculopathy Patient with acute lumbar radiculopathy. He is reporting red flag symptoms at this time of saddle anesthesia and an episode of full urinary incontinence which is new. Given this history, I recommend that he return to the emergency room at [facility] or [place] to have urgent MRI of the lumbar spine.  If he is unable to tolerate having the right knee slightly elevated for the procedure, he can be given low-dose of IV or IM pain medication. -I have refilled the hydrocodone as he has 2 pills left.  Advised to take the medicine as needed as prescribed.  If he is discharged from the ED, patient advised not to do any lifting, pushing or pulling and to use a heating pad to the lower back.  Further management will be based on results of the study. He does have history of prostate cancer for which  nonsurgical treatment is planned.  PET scan done 12/16/2020 which did not reveal any metastasis.  2. Essential hypertension Elevated likely due to pain.  Advised patient to take his amlodipine whenever he returns home.   Patient was given the opportunity to ask questions.  Patient verbalized understanding of the plan and was able to repeat key elements of the plan.   No orders of the defined types were placed in this encounter.    Requested Prescriptions    No prescriptions requested or ordered in this encounter    No follow-ups on file.  Karle Plumber, MD, FACP

## 2021-01-14 NOTE — ED Notes (Signed)
Patient decided to leave.   

## 2021-01-14 NOTE — Discharge Instructions (Signed)
Go directly to [facility] emergency department.  Your MRI of your back is ordered.

## 2021-01-15 ENCOUNTER — Other Ambulatory Visit: Payer: Self-pay

## 2021-01-15 ENCOUNTER — Other Ambulatory Visit: Payer: Self-pay | Admitting: Internal Medicine

## 2021-01-15 MED ORDER — APIXABAN 5 MG PO TABS
ORAL_TABLET | Freq: Two times a day (BID) | ORAL | 2 refills | Status: DC
  Filled 2021-01-15: qty 60, 30d supply, fill #0
  Filled 2021-04-14: qty 60, 30d supply, fill #1
  Filled 2021-07-31: qty 60, 30d supply, fill #2
  Filled 2021-07-31: qty 60, 30d supply, fill #0

## 2021-01-15 NOTE — Telephone Encounter (Signed)
    Notes to clinic:  Requested script has expired  Review for continued use and refill    Requested Prescriptions  Pending Prescriptions Disp Refills   apixaban (ELIQUIS) 5 MG TABS tablet 60 tablet 2    Sig: TAKE 1 TABLET BY MOUTH TWICE DAILY.      Hematology:  Anticoagulants Passed - 01/15/2021 11:53 AM      Passed - HGB in normal range and within 360 days    Hemoglobin  Date Value Ref Range Status  01/10/2021 13.5 13.0 - 17.0 g/dL Final  01/29/2020 13.8 13.0 - 17.7 g/dL Final          Passed - PLT in normal range and within 360 days    Platelets  Date Value Ref Range Status  01/10/2021 248 150 - 400 K/uL Final  01/29/2020 268 150 - 450 x10E3/uL Final          Passed - HCT in normal range and within 360 days    HCT  Date Value Ref Range Status  01/10/2021 40.0 39.0 - 52.0 % Final   Hematocrit  Date Value Ref Range Status  01/29/2020 40.6 37.5 - 51.0 % Final          Passed - Cr in normal range and within 360 days    Creatinine, Ser  Date Value Ref Range Status  01/10/2021 0.96 0.61 - 1.24 mg/dL Final   Creatinine, POC  Date Value Ref Range Status  12/04/2016 50 mg/dL Final   Creatinine, Urine  Date Value Ref Range Status  10/16/2014 565.1 mg/dL Final    Comment:    Result repeated and verified. Result confirmed by automatic dilution. No reference range established.           Passed - Valid encounter within last 12 months    Recent Outpatient Visits           Yesterday Lumbar radiculopathy   [place] Karle Plumber B, MD   2 months ago Type 2 diabetes mellitus with other neurologic complication, without long-term current use of insulin [facility])   [place], Deborah B, MD   7 months ago Hospital discharge follow-up   [place], MD   8 months ago Cough productive of purulent sputum   [place], Darrick Penna, MD   8 months ago URI with cough and congestion   [place], MD       Future Appointments             In 1 month [name], Dalbert Batman, MD [place]

## 2021-01-16 ENCOUNTER — Other Ambulatory Visit: Payer: Self-pay

## 2021-01-20 ENCOUNTER — Other Ambulatory Visit: Payer: Self-pay

## 2021-01-22 ENCOUNTER — Other Ambulatory Visit: Payer: Self-pay

## 2021-01-22 DIAGNOSIS — M5416 Radiculopathy, lumbar region: Secondary | ICD-10-CM | POA: Diagnosis not present

## 2021-01-22 MED ORDER — DIAZEPAM 5 MG PO TABS: ORAL_TABLET | ORAL | 1 refills | Status: DC

## 2021-01-22 MED ORDER — METHYLPREDNISOLONE 4 MG PO TBPK
ORAL_TABLET | ORAL | 0 refills | Status: DC
  Filled 2021-01-22: qty 21, 6d supply, fill #0

## 2021-01-22 MED ORDER — OXYCODONE HCL 10 MG PO TABS: ORAL_TABLET | ORAL | 0 refills | Status: DC

## 2021-01-23 ENCOUNTER — Other Ambulatory Visit: Payer: Self-pay | Admitting: Neurosurgery

## 2021-01-23 ENCOUNTER — Other Ambulatory Visit (HOSPITAL_COMMUNITY): Payer: Self-pay | Admitting: Neurosurgery

## 2021-01-23 DIAGNOSIS — M5416 Radiculopathy, lumbar region: Secondary | ICD-10-CM

## 2021-01-24 ENCOUNTER — Other Ambulatory Visit: Payer: Self-pay

## 2021-01-24 DIAGNOSIS — C61 Malignant neoplasm of prostate: Secondary | ICD-10-CM | POA: Diagnosis not present

## 2021-01-29 ENCOUNTER — Ambulatory Visit (INDEPENDENT_AMBULATORY_CARE_PROVIDER_SITE_OTHER): Payer: Medicare HMO | Admitting: Podiatry

## 2021-01-29 ENCOUNTER — Other Ambulatory Visit: Payer: Self-pay

## 2021-01-29 ENCOUNTER — Encounter: Payer: Self-pay | Admitting: Podiatry

## 2021-01-29 DIAGNOSIS — M79675 Pain in left toe(s): Secondary | ICD-10-CM | POA: Diagnosis not present

## 2021-01-29 DIAGNOSIS — D689 Coagulation defect, unspecified: Secondary | ICD-10-CM | POA: Diagnosis not present

## 2021-01-29 DIAGNOSIS — B351 Tinea unguium: Secondary | ICD-10-CM | POA: Diagnosis not present

## 2021-01-29 DIAGNOSIS — E1149 Type 2 diabetes mellitus with other diabetic neurological complication: Secondary | ICD-10-CM | POA: Diagnosis not present

## 2021-01-29 DIAGNOSIS — M79674 Pain in right toe(s): Secondary | ICD-10-CM

## 2021-01-29 NOTE — Progress Notes (Signed)
This patient returns to my office for at risk foot care.  This patient requires this care by a professional since this patient will be at risk due to having diabetes.  This patient is unable to cut nails himself since the patient cannot reach his nails.These nails are painful walking and wearing shoes.  This patient presents for at risk foot care today.  General Appearance  Alert, conversant and in no acute stress.  Vascular  Dorsalis pedis and posterior tibial  pulses are palpable  bilaterally.  Capillary return is within normal limits  bilaterally. Temperature is within normal limits  bilaterally.  Neurologic  Senn-Weinstein monofilament wire test within normal limits  bilaterally. Muscle power within normal limits bilaterally.  Nails Thick disfigured discolored nails with subungual debris  from hallux to fifth toes bilaterally. No evidence of bacterial infection or drainage bilaterally.  Orthopedic  No limitations of motion  feet .  No crepitus or effusions noted.  No bony pathology or digital deformities noted.  Skin  normotropic skin with no porokeratosis noted bilaterally.  No signs of infections or ulcers noted.     Onychomycosis  Pain in right toes  Pain in left toes  Porokeratosis sub5th met  B/L asymptomatic.  Consent was obtained for treatment procedures.   Mechanical debridement of nails 1-5  bilaterally performed with a nail nipper.  Filed with dremel without incident.     Return office visit   3 months                  Told patient to return for periodic foot care and evaluation due to potential at risk complications.   Gardiner Barefoot DPM

## 2021-01-31 ENCOUNTER — Other Ambulatory Visit: Payer: Self-pay

## 2021-01-31 ENCOUNTER — Ambulatory Visit (HOSPITAL_COMMUNITY)
Admission: RE | Admit: 2021-01-31 | Discharge: 2021-01-31 | Disposition: A | Discharge status: Home / Self Care | Payer: Medicare HMO | Source: Ambulatory Visit | Attending: Neurosurgery | Admitting: Neurosurgery

## 2021-01-31 ENCOUNTER — Telehealth: Payer: Self-pay | Admitting: *Deleted

## 2021-01-31 DIAGNOSIS — M5416 Radiculopathy, lumbar region: Secondary | ICD-10-CM | POA: Insufficient documentation

## 2021-01-31 DIAGNOSIS — M545 Low back pain, unspecified: Secondary | ICD-10-CM | POA: Diagnosis not present

## 2021-01-31 NOTE — Telephone Encounter (Signed)
CALLED PATIENT TO INFORM OF FID. MARKERS AND SPACE OAR ON 02-25-21 @ [facility] AND HIS SIM ON 03-04-21@ 2:30 PM @ [place], SPOKE WITH PATIENT AND HE IS AWARE OF THESE APPTS.

## 2021-02-03 ENCOUNTER — Telehealth: Payer: Self-pay | Admitting: *Deleted

## 2021-02-03 NOTE — Telephone Encounter (Signed)
CALLED PATIENT TO INFORM OF FID. MARKERS AND SPACE OAR GEL TO BE PLACED ON 02-25-21 @ [facility] AND HIS SIM ON 03-04-21- ARRIVAL TIME- 2:15 PM @ [place], SPOKE WITH PATIENT AND HE IS AWARE OF THESE APPTS.

## 2021-02-17 ENCOUNTER — Other Ambulatory Visit: Payer: Self-pay

## 2021-02-17 MED ORDER — SULFAMETHOXAZOLE-TRIMETHOPRIM 800-160 MG PO TABS
ORAL_TABLET | ORAL | 0 refills | Status: DC
  Filled 2021-02-17: qty 6, 3d supply, fill #0

## 2021-02-19 ENCOUNTER — Other Ambulatory Visit: Payer: Self-pay

## 2021-02-19 DIAGNOSIS — I10 Essential (primary) hypertension: Secondary | ICD-10-CM | POA: Diagnosis not present

## 2021-02-19 DIAGNOSIS — M5416 Radiculopathy, lumbar region: Secondary | ICD-10-CM | POA: Diagnosis not present

## 2021-02-20 ENCOUNTER — Other Ambulatory Visit: Payer: Self-pay

## 2021-02-20 ENCOUNTER — Other Ambulatory Visit: Payer: Self-pay | Admitting: Neurosurgery

## 2021-02-20 ENCOUNTER — Telehealth: Payer: Self-pay | Admitting: Internal Medicine

## 2021-02-20 NOTE — Telephone Encounter (Signed)
Copied from [place] [id number]. Topic: General - Other >> Feb 18, 2021  3:17 PM Pawlus, Brayton Layman A wrote: Reason for CRM: [facility] called to follow up on a medical clearance form that was faxed over, please advise if you have received this form. >> Feb 19, 2021 10:04 AM Yvette Rack wrote: Pt stated his Urologist needs Dr. [name] to provide details on how long pt should be off of the apixaban (ELIQUIS) 5 MG TABS tablet .

## 2021-02-20 NOTE — Telephone Encounter (Signed)
Medical clearance was faxed back on 8/31

## 2021-02-21 ENCOUNTER — Other Ambulatory Visit: Payer: Self-pay

## 2021-02-21 ENCOUNTER — Other Ambulatory Visit: Payer: Self-pay | Admitting: Internal Medicine

## 2021-02-21 DIAGNOSIS — R200 Anesthesia of skin: Secondary | ICD-10-CM

## 2021-02-21 DIAGNOSIS — M5416 Radiculopathy, lumbar region: Secondary | ICD-10-CM

## 2021-02-21 MED ORDER — GABAPENTIN 100 MG PO CAPS
ORAL_CAPSULE | ORAL | 0 refills | Status: DC
  Filled 2021-02-21 – 2021-03-03 (×2): qty 90, 30d supply, fill #0

## 2021-02-25 ENCOUNTER — Telehealth: Payer: Self-pay | Admitting: *Deleted

## 2021-02-25 DIAGNOSIS — C61 Malignant neoplasm of prostate: Secondary | ICD-10-CM | POA: Diagnosis not present

## 2021-02-25 NOTE — Telephone Encounter (Signed)
Patient called to question if he should still go through with markers and space oar gel placement at [facility] today.  He states he is due ot have back surgery on 03/11/2021 which the patient feels is priority due to the level of pain he is experiencing.    Spoke with Romie Jumper with [facility] and she confirmed she has reached out to Dr Tammi Klippel to see if the timetable needs to be adjusted to allow for his back surgery assuming that patient went through with appointment today.  Enid Derry to follow up with patient with response after she receives it.

## 2021-02-28 ENCOUNTER — Other Ambulatory Visit: Payer: Self-pay

## 2021-03-03 ENCOUNTER — Other Ambulatory Visit: Payer: Self-pay

## 2021-03-03 ENCOUNTER — Ambulatory Visit: Payer: Medicare HMO | Attending: Internal Medicine | Admitting: Internal Medicine

## 2021-03-03 ENCOUNTER — Encounter: Payer: Self-pay | Admitting: Internal Medicine

## 2021-03-03 VITALS — BP 140/70 | HR 72 | Resp 16

## 2021-03-03 DIAGNOSIS — Z8673 Personal history of transient ischemic attack (TIA), and cerebral infarction without residual deficits: Secondary | ICD-10-CM | POA: Insufficient documentation

## 2021-03-03 DIAGNOSIS — Z01818 Encounter for other preprocedural examination: Secondary | ICD-10-CM

## 2021-03-03 DIAGNOSIS — I129 Hypertensive chronic kidney disease with stage 1 through stage 4 chronic kidney disease, or unspecified chronic kidney disease: Secondary | ICD-10-CM | POA: Diagnosis not present

## 2021-03-03 DIAGNOSIS — Z79899 Other long term (current) drug therapy: Secondary | ICD-10-CM | POA: Diagnosis not present

## 2021-03-03 DIAGNOSIS — R9431 Abnormal electrocardiogram [ECG] [EKG]: Secondary | ICD-10-CM | POA: Diagnosis not present

## 2021-03-03 DIAGNOSIS — Z87891 Personal history of nicotine dependence: Secondary | ICD-10-CM | POA: Diagnosis not present

## 2021-03-03 DIAGNOSIS — Z888 Allergy status to other drugs, medicaments and biological substances status: Secondary | ICD-10-CM | POA: Insufficient documentation

## 2021-03-03 DIAGNOSIS — E1122 Type 2 diabetes mellitus with diabetic chronic kidney disease: Secondary | ICD-10-CM | POA: Insufficient documentation

## 2021-03-03 DIAGNOSIS — I10 Essential (primary) hypertension: Secondary | ICD-10-CM | POA: Diagnosis not present

## 2021-03-03 DIAGNOSIS — E1149 Type 2 diabetes mellitus with other diabetic neurological complication: Secondary | ICD-10-CM | POA: Diagnosis not present

## 2021-03-03 DIAGNOSIS — I480 Paroxysmal atrial fibrillation: Secondary | ICD-10-CM

## 2021-03-03 DIAGNOSIS — Z7901 Long term (current) use of anticoagulants: Secondary | ICD-10-CM | POA: Insufficient documentation

## 2021-03-03 DIAGNOSIS — N182 Chronic kidney disease, stage 2 (mild): Secondary | ICD-10-CM | POA: Insufficient documentation

## 2021-03-03 DIAGNOSIS — Z7984 Long term (current) use of oral hypoglycemic drugs: Secondary | ICD-10-CM | POA: Diagnosis not present

## 2021-03-03 DIAGNOSIS — M5416 Radiculopathy, lumbar region: Secondary | ICD-10-CM

## 2021-03-03 NOTE — Progress Notes (Signed)
Patient ID: [name], male    DOB: October 18, 1950  MRN: [id number]  CC: preop eval  Subjective: [name] is a 70 y.o. 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 cocaine abuse in remission since 2005, LT pneumothorax.   Pt will be having microdiscectomy surgery on lumbar 4-5 03/11/21 by Dr. Trenton Gammon to relieve his lumbar radiculopathy symptoms. Prior to having this acute issue with his back, he reports that he was able to walk about 2-3 blocks with his walker if he takes his time. No CP/SOB with walking.  He has 8 stairs in his house.  He can get up and down the stairs without chest pains or shortness of breath.   Checking BS QOD.  Gives range in low 100s. Takes Metformin PRN.  Last took it 2 mths ago Checking BP regularly. Gives range 130s/80.  He has not had any swelling in his legs.  Denies any palpitations.  No bruising or bleeding on Eliquis. He underwent robotic assisted wedge resection, apical pleurectomy and mechanical pleurodesis under general anesthesia in November of last year for spontaneous pneumothorax.  He reports no major issues waking up from anesthesia.  Patient Active Problem List   Diagnosis Date Noted   Plantar flexed metatarsal, left 07/30/2020   Plantar flexed metatarsal, right 07/30/2020   Tobacco dependence 05/27/2020   History of pneumothorax 05/27/2020   Pneumothorax 05/10/2020   Blood clotting disorder ([place]) 04/23/2020   Stressful life event affecting family 01/29/2020   Hyperlipidemia associated with type 2 diabetes mellitus ([place]) 01/29/2020   Personal history of fall 01/29/2020   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   Unintended weight loss 01/29/2020   Pain due to onychomycosis of toenails of both feet 08/01/2019   Porokeratosis 08/01/2019    Influenza vaccine needed 02/24/2019   Diabetes mellitus ([place]) 02/24/2019   Malignant neoplasm of prostate ([place]) 09/23/2018   Hyperlipemia 11/24/2016   History of CVA with residual deficit 11/24/2016   GERD (gastroesophageal reflux disease) 08/09/2014   Atrial fibrillation ([place])    Hemiparesis affecting left side as late effect of stroke ([place]) 05/01/2014   ERECTILE DYSFUNCTION, 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 90 tablet 1   apixaban (ELIQUIS) 5 MG TABS tablet TAKE 1 TABLET BY MOUTH TWICE DAILY. 60 tablet 2   B Complex Vitamins (VITAMIN B COMPLEX) TABS Take 1 tablet by mouth daily.     Blood Glucose Monitoring Suppl (TRUE METRIX METER) w/Device KIT Use as directed 1 kit 0   diclofenac Sodium (VOLTAREN) 1 % GEL Apply 2 g topically 3 (three) times daily as needed (pain). 100 g 1   fluticasone (FLONASE) 50 MCG/ACT nasal spray Place 1 spray into both nostrils daily as needed for allergies or rhinitis.     gabapentin (NEURONTIN) 100 MG capsule TAKE 1 CAPSULE (100 MG TOTAL) BY MOUTH 3 (THREE) TIMES DAILY. NEEDED FOR NUMBNESS 90 capsule 0   GlucoCom Lancets MISC 100 Units  by Does not apply route once. 100 each 0   glucose blood (TRUE METRIX BLOOD GLUCOSE TEST) test strip Use as instructed 100 each 12   hydrOXYzine (ATARAX/VISTARIL) 25 MG tablet TAKE 1 TABLET (25 MG TOTAL) BY MOUTH 2 (TWO) TIMES DAILY AS NEEDED. 30 tablet 2   meloxicam (MOBIC) 7.5 MG tablet Take 1 tablet daily for sacroiliac pain. (Patient taking differently: Take 7.5 mg by mouth daily as needed for pain. for sacroiliac pain.) 15 tablet 0   metFORMIN (GLUCOPHAGE) 500 MG  tablet Take 500 mg by mouth daily with breakfast.     Multiple Vitamin (MULTIVITAMIN WITH MINERALS) TABS tablet Take 1 tablet by mouth daily.     No current facility-administered medications on file prior to visit.    Allergies  Allergen Reactions   Lisinopril Swelling    Throat swelling    Social History   Socioeconomic History   Marital status: Married    Spouse name: Not on file   Number of children: 5   Years of education: Not on file   Highest education level: Not on file  Occupational History   Occupation: Caregiver    Comment: retired  Tobacco Use   Smoking status: Former    Packs/day: 0.25    Years: 5.00    Pack years: 1.25    Types: Cigarettes    Quit date: 05/25/2014    Years since quitting: 6.7   Smokeless tobacco: Never   Tobacco comments:    Quit 05/15/14  Vaping Use   Vaping Use: Never used  Substance and Sexual Activity   Alcohol use: No    Alcohol/week: 0.0 standard drinks   Drug use: No   Sexual activity: Not Currently  Other Topics Concern   Not on file  Social History Narrative   Lives with his wife   5 children, grown.   7 grandchildren.    Social Determinants of Health   Financial Resource Strain: Not on file  Food Insecurity: Not on file  Transportation Needs: Not on file  Physical Activity: Not on file  Stress: Not on file  Social Connections: Not on file  Intimate Partner Violence: Not on file    Family History  Problem Relation Age of Onset   Diabetes Mother    Breast cancer Mother    Diabetes Father    Breast cancer Sister    Prostate cancer Neg Hx    Colon cancer Neg Hx    Pancreatic cancer Neg Hx     Past Surgical History:  Procedure Laterality Date   CATARACT EXTRACTION  06/2017   CYSTOSCOPY  05/2017   INTERCOSTAL NERVE BLOCK Left 05/13/2020   Procedure: INTERCOSTAL NERVE BLOCK;  Surgeon: Lajuana Matte, MD;  Location: [facility];  Service: Thoracic;  Laterality: Left;   PROSTATE BIOPSY      ROS: Review of  Systems Negative except as stated above  PHYSICAL EXAM: BP 140/70   Pulse 72   Resp 16   SpO2 96%   Physical Exam BP 140/70  General appearance - alert, well appearing, elderly African-American male sitting in wheelchair and in no distress Mental status - normal mood, behavior, speech, dress, motor activity, and thought processes Eyes - pupils equal and reactive, extraocular eye movements intact Nose - normal and patent, no erythema, discharge or polyps Mouth - mucous membranes moist, pharynx normal without lesions Neck - supple, no significant adenopathy Chest - clear to auscultation, no wheezes, rales or rhonchi, symmetric air entry Heart - normal  rate, regular rhythm, normal S1, S2, no murmurs, rubs, clicks or gallops Abdomen - soft, nontender, nondistended, no masses or organomegaly Musculoskeletal -patient is in wheelchair.  He self propels  EKG reveals sinus bradycardia with rate of 57, Q waves in leads II and aVF foot and T wave inversions in V5/V6 that are new compared to previous EKG done in November of last year. CMP Latest Ref Rng & Units 01/10/2021 05/27/2020 05/15/2020  Glucose 70 - 99 mg/dL 147(H) 174(H) 133(H)  BUN 8 - 23 mg/dL 23 19 23   Creatinine 0.61 - 1.24 mg/dL 0.96 1.16 1.39(H)  Sodium 135 - 145 mmol/L 134(L) 139 136  Potassium 3.5 - 5.1 mmol/L 3.9 4.7 3.9  Chloride 98 - 111 mmol/L 104 102 104  CO2 22 - 32 mmol/L 21(L) 24 24  Calcium 8.9 - 10.3 mg/dL 8.9 9.3 8.7(L)  Total Protein 6.5 - 8.1 g/dL - - 6.0(L)  Total Bilirubin 0.3 - 1.2 mg/dL - - 1.0  Alkaline Phos 38 - 126 U/L - - 48  AST 15 - 41 U/L - - 47(H)  ALT 0 - 44 U/L - - 41   Lipid Panel     Component Value Date/Time   CHOL 189 01/29/2020 1022   TRIG 123 01/29/2020 1022   HDL 58 01/29/2020 1022   CHOLHDL 3.3 01/29/2020 1022   CHOLHDL 4.2 05/02/2014 0000   VLDL 72 (H) 05/02/2014 0000   LDLCALC 109 (H) 01/29/2020 1022    CBC    Component Value Date/Time   WBC 4.4 01/10/2021 1357   RBC 4.40  01/10/2021 1357   HGB 13.5 01/10/2021 1357   HGB 13.8 01/29/2020 1022   HCT 40.0 01/10/2021 1357   HCT 40.6 01/29/2020 1022   PLT 248 01/10/2021 1357   PLT 268 01/29/2020 1022   MCV 90.9 01/10/2021 1357   MCV 89 01/29/2020 1022   MCH 30.7 01/10/2021 1357   MCHC 33.8 01/10/2021 1357   RDW 14.2 01/10/2021 1357   RDW 13.8 01/29/2020 1022   LYMPHSABS 1.3 01/10/2021 1357   MONOABS 0.3 01/10/2021 1357   EOSABS 0.1 01/10/2021 1357   BASOSABS 0.0 01/10/2021 1357    ASSESSMENT AND PLAN: 1. Preoperative evaluation to rule out surgical contraindication Patient scheduled for discectomy on the lumbar spine.  Patient considered low to moderate risk.  EKG is abnormal and changed from previous.  I will refer him to cardiology and see if they can get him in prior to his surgery.  Patient advised to hold Eliquis for 2 full days prior to his surgery. - EKG 12-Lead - Ambulatory referral to Cardiology  2. Lumbar radiculopathy  3. Type 2 diabetes mellitus with other neurologic complication, without long-term current use of insulin (HCC) We will check his A1c today and we will let him know whether he needs to get back on the metformin consistently. - Hemoglobin A1c - Comprehensive metabolic panel  4. Essential hypertension Systolic blood pressure above goal today.  However patient reports that his blood pressures have been good at home.  He will continue Norvasc.  5. PAF (paroxysmal atrial fibrillation) (HCC) Currently in sinus rhythm.  Continue Eliquis.  Advised to stop Eliquis for 2 full days prior to his surgery.  6. Abnormal EKG - Ambulatory referral to Cardiology  Patient was given the opportunity to ask questions.  Patient verbalized understanding of the plan and was able to repeat key elements of the plan.   Orders Placed This Encounter  Procedures   Hemoglobin A1c  Comprehensive metabolic panel   Ambulatory referral to Cardiology   EKG 12-Lead      Requested Prescriptions     No prescriptions requested or ordered in this encounter    No follow-ups on file.  Karle Plumber, MD, FACP

## 2021-03-03 NOTE — Patient Instructions (Addendum)
Stop Eliquis for 2 full days prior to surgery. I will send you to see the cardiologist prior to your surgery.  They will call you with the appointment.

## 2021-03-04 ENCOUNTER — Telehealth: Payer: Self-pay | Admitting: Internal Medicine

## 2021-03-04 ENCOUNTER — Ambulatory Visit: Payer: Medicare HMO | Admitting: Radiation Oncology

## 2021-03-04 ENCOUNTER — Other Ambulatory Visit: Payer: Self-pay

## 2021-03-04 ENCOUNTER — Telehealth: Payer: Self-pay | Admitting: *Deleted

## 2021-03-04 ENCOUNTER — Other Ambulatory Visit: Payer: Self-pay | Admitting: Pharmacist

## 2021-03-04 LAB — COMPREHENSIVE METABOLIC PANEL
ALT: 16 IU/L (ref 0–44)
AST: 15 IU/L (ref 0–40)
Albumin/Globulin Ratio: 1.6 (ref 1.2–2.2)
Albumin: 4.2 g/dL (ref 3.8–4.8)
Alkaline Phosphatase: 76 IU/L (ref 44–121)
BUN/Creatinine Ratio: 24 (ref 10–24)
BUN: 28 mg/dL — ABNORMAL HIGH (ref 8–27)
Bilirubin Total: 0.2 mg/dL (ref 0.0–1.2)
CO2: 21 mmol/L (ref 20–29)
Calcium: 9 mg/dL (ref 8.6–10.2)
Chloride: 104 mmol/L (ref 96–106)
Creatinine, Ser: 1.15 mg/dL (ref 0.76–1.27)
Globulin, Total: 2.7 g/dL (ref 1.5–4.5)
Glucose: 111 mg/dL — ABNORMAL HIGH (ref 65–99)
Potassium: 4.6 mmol/L (ref 3.5–5.2)
Sodium: 138 mmol/L (ref 134–144)
Total Protein: 6.9 g/dL (ref 6.0–8.5)
eGFR: 69 mL/min/{1.73_m2} (ref >59)

## 2021-03-04 LAB — HEMOGLOBIN A1C
Est. average glucose Bld gHb Est-mCnc: 171 mg/dL
Hgb A1c MFr Bld: 7.6 % — ABNORMAL HIGH (ref 4.8–5.6)

## 2021-03-04 MED ORDER — METFORMIN HCL 500 MG PO TABS
500.0000 mg | ORAL_TABLET | Freq: Every day | ORAL | 2 refills | Status: DC
  Filled 2021-03-04: qty 30, 30d supply, fill #0

## 2021-03-04 NOTE — Telephone Encounter (Signed)
Called pt made aware of MD message

## 2021-03-04 NOTE — Telephone Encounter (Signed)
Returned patient's phone call, spoke with patient 

## 2021-03-04 NOTE — Telephone Encounter (Signed)
Phone call placed to patient this morning.  Patient informed that his A1c is 7.6.  BUN slightly elevated.  I recommend that he resumes taking metformin 1 tablet daily.  Encouraged him to drink several glasses of water daily.  Patient expressed understanding and thanked me for the call.

## 2021-03-04 NOTE — Telephone Encounter (Signed)
Pt had ekg yesterday with dr [name] and the ekg was compared to nov  2021 ekg and pt had a collapsed lung at that time. Pt would like to know if he still needs to see cardiologist. Please advise

## 2021-03-05 ENCOUNTER — Other Ambulatory Visit: Payer: Self-pay

## 2021-03-05 ENCOUNTER — Telehealth: Payer: Self-pay | Admitting: Internal Medicine

## 2021-03-05 NOTE — Telephone Encounter (Signed)
Copied from [place] [id number]. Topic: General - Inquiry >> Mar 04, 2021  8:41 AM Loma [place] wrote: Reason for CRM: Pt was in for lab work yesterday and is very anxious about his A1C. Pt requesting to speak with Jada. Results are not release for NT, Pt states does not do MyChart, wants a fu call from [facility]. Cb [phone]

## 2021-03-05 NOTE — Telephone Encounter (Signed)
Provider hasn't seen results yet. Once provider sees them pt will be contacted   Dr. [name] can please results labs

## 2021-03-06 NOTE — Pre-Procedure Instructions (Signed)
Surgical Instructions    Your procedure is scheduled on Tuesday 03/11/21.   Report to [facility] Main Entrance "A" at 10:30 A.M., then check in with the Admitting office.  Call this number if you have problems the morning of surgery:  [phone]   If you have any questions prior to your surgery date call [phone]: Open Monday-Friday 8am-4pm    Remember:  Do not eat or drink after midnight the night before your surgery     Take these medicines the morning of surgery with A SIP OF WATER   amLODipine (NORVASC)   Take these medicines if needed:   fluticasone (FLONASE)  gabapentin (NEURONTIN)   hydrOXYzine (ATARAX/VISTARIL)   Please follow your surgeon's instructions regarding apixaban (ELIQUIS). If you have not received instructions then you need to contact your surgeon's office for instructions.   As of today, STOP taking any Aspirin (unless otherwise instructed by your surgeon) meloxicam (MOBIC), Aleve, Naproxen, Ibuprofen, Motrin, Advil, Goody's, BC's, all herbal medications, fish oil, and all vitamins.  WHAT DO I DO ABOUT MY DIABETES MEDICATION?   Do not take oral diabetes medicines (pills) the morning of surgery. Do not take metFORMIN (GLUCOPHAGE) the morning of surgery.   The day of surgery, do not take other diabetes injectables, including Byetta (exenatide), Bydureon (exenatide ER), Victoza (liraglutide), or Trulicity (dulaglutide).   HOW TO MANAGE YOUR DIABETES BEFORE AND AFTER SURGERY  Why is it important to control my blood sugar before and after surgery? Improving blood sugar levels before and after surgery helps healing and can limit problems. A way of improving blood sugar control is eating a healthy diet by:  Eating less sugar and carbohydrates  Increasing activity/exercise  Talking with your doctor about reaching your blood sugar goals High blood sugars (greater than 180 mg/dL) can raise your risk of infections and slow your recovery, so you will need to  focus on controlling your diabetes during the weeks before surgery. Make sure that the doctor who takes care of your diabetes knows about your planned surgery including the date and location.  How do I manage my blood sugar before surgery? Check your blood sugar at least 4 times a day, starting 2 days before surgery, to make sure that the level is not too high or low.  Check your blood sugar the morning of your surgery when you wake up and every 2 hours until you get to the Short Stay unit.  If your blood sugar is less than 70 mg/dL, you will need to treat for low blood sugar: Do not take insulin. Treat a low blood sugar (less than 70 mg/dL) with  cup of clear juice (cranberry or apple), 4 glucose tablets, OR glucose gel. Recheck blood sugar in 15 minutes after treatment (to make sure it is greater than 70 mg/dL). If your blood sugar is not greater than 70 mg/dL on recheck, call [phone] for further instructions. Report your blood sugar to the short stay nurse when you get to Short Stay.  If you are admitted to the hospital after surgery: Your blood sugar will be checked by the staff and you will probably be given insulin after surgery (instead of oral diabetes medicines) to make sure you have good blood sugar levels. The goal for blood sugar control after surgery is 80-180 mg/dL.           Do not wear jewelry or makeup Do not wear lotions, powders, perfumes/colognes, or deodorant. Do not shave 48 hours prior to surgery.  Men may shave face and neck. Do not bring valuables to the hospital. DO Not wear nail polish, gel polish, artificial nails, or any other type of covering on natural nails including finger and toenails. If patients have artificial nails, gel coating, etc. that need to be removed by a nail salon please have this removed prior to surgery or surgery may need to be canceled/delayed if the surgeon/ anesthesia feels like the patient is unable to be adequately monitored.              [facility] is not responsible for any belongings or valuables.  Do NOT Smoke (Tobacco/Vaping)  24 hours prior to your procedure If you use a CPAP at night, you may bring your mask for your overnight stay.   Contacts, glasses, dentures or bridgework may not be worn into surgery, please bring cases for these belongings   For patients admitted to the hospital, discharge time will be determined by your treatment team.   Patients discharged the day of surgery will not be allowed to drive home, and someone needs to stay with them for 24 hours.  NO VISITORS WILL BE ALLOWED IN PRE-OP WHERE PATIENTS GET READY FOR SURGERY.  ONLY 1 SUPPORT PERSON MAY BE PRESENT WHILE YOU ARE IN SURGERY.  IF YOU ARE TO BE ADMITTED, ONCE YOU ARE IN YOUR ROOM YOU WILL BE ALLOWED TWO (2) VISITORS.  Minor children may have two parents present. Special consideration for safety and communication needs will be reviewed on a case by case basis.  Special instructions:    Oral Hygiene is also important to reduce your risk of infection.  Remember - BRUSH YOUR TEETH THE MORNING OF SURGERY WITH YOUR REGULAR TOOTHPASTE   [facility]- Preparing For Surgery  Before surgery, you can play an important role. Because skin is not sterile, your skin needs to be as free of germs as possible. You can reduce the number of germs on your skin by washing with CHG (chlorahexidine gluconate) Soap before surgery.  CHG is an antiseptic cleaner which kills germs and bonds with the skin to continue killing germs even after washing.     Please do not use if you have an allergy to CHG or antibacterial soaps. If your skin becomes reddened/irritated stop using the CHG.  Do not shave (including legs and underarms) for at least 48 hours prior to first CHG shower. It is OK to shave your face.  Please follow these instructions carefully.     Shower the NIGHT BEFORE SURGERY and the MORNING OF SURGERY with CHG Soap.   If you chose to wash your hair,  wash your hair first as usual with your normal shampoo. After you shampoo, rinse your hair and body thoroughly to remove the shampoo.  Then [employer] and genitals (private parts) with your normal soap and rinse thoroughly to remove soap.  After that Use CHG Soap as you would any other liquid soap. You can apply CHG directly to the skin and wash gently with a scrungie or a clean washcloth.   Apply the CHG Soap to your body ONLY FROM THE NECK DOWN.  Do not use on open wounds or open sores. Avoid contact with your eyes, ears, mouth and genitals (private parts). Wash Face and genitals (private parts)  with your normal soap.   Wash thoroughly, paying special attention to the area where your surgery will be performed.  Thoroughly rinse your body with warm water from the neck down.  DO NOT  shower/wash with your normal soap after using and rinsing off the CHG Soap.  Pat yourself dry with a CLEAN TOWEL.  Wear CLEAN PAJAMAS to bed the night before surgery  Place CLEAN SHEETS on your bed the night before your surgery  DO NOT SLEEP WITH PETS.   Day of Surgery:  Take a shower with CHG soap. Wear Clean/Comfortable clothing the morning of surgery Do not apply any deodorants/lotions.   Remember to brush your teeth WITH YOUR REGULAR TOOTHPASTE.   Please read over the following fact sheets that you were given.

## 2021-03-07 ENCOUNTER — Other Ambulatory Visit: Payer: Self-pay

## 2021-03-07 ENCOUNTER — Ambulatory Visit (INDEPENDENT_AMBULATORY_CARE_PROVIDER_SITE_OTHER): Payer: Medicare HMO | Admitting: Internal Medicine

## 2021-03-07 ENCOUNTER — Encounter (HOSPITAL_COMMUNITY): Payer: Self-pay

## 2021-03-07 ENCOUNTER — Encounter (HOSPITAL_COMMUNITY)
Admission: RE | Admit: 2021-03-07 | Discharge: 2021-03-07 | Disposition: A | Discharge status: Home / Self Care | Payer: Medicare HMO | Source: Ambulatory Visit | Attending: Neurosurgery | Admitting: Neurosurgery

## 2021-03-07 ENCOUNTER — Encounter: Payer: Self-pay | Admitting: Internal Medicine

## 2021-03-07 VITALS — BP 126/72 | HR 74 | Ht 73.0 in | Wt 188.0 lb

## 2021-03-07 DIAGNOSIS — E119 Type 2 diabetes mellitus without complications: Secondary | ICD-10-CM | POA: Diagnosis not present

## 2021-03-07 DIAGNOSIS — Z7901 Long term (current) use of anticoagulants: Secondary | ICD-10-CM | POA: Diagnosis not present

## 2021-03-07 DIAGNOSIS — I517 Cardiomegaly: Secondary | ICD-10-CM | POA: Diagnosis not present

## 2021-03-07 DIAGNOSIS — Z01810 Encounter for preprocedural cardiovascular examination: Secondary | ICD-10-CM

## 2021-03-07 DIAGNOSIS — Z8673 Personal history of transient ischemic attack (TIA), and cerebral infarction without residual deficits: Secondary | ICD-10-CM | POA: Diagnosis not present

## 2021-03-07 DIAGNOSIS — R9431 Abnormal electrocardiogram [ECG] [EKG]: Secondary | ICD-10-CM | POA: Diagnosis not present

## 2021-03-07 DIAGNOSIS — Z20822 Contact with and (suspected) exposure to covid-19: Secondary | ICD-10-CM | POA: Diagnosis not present

## 2021-03-07 DIAGNOSIS — M5416 Radiculopathy, lumbar region: Secondary | ICD-10-CM | POA: Insufficient documentation

## 2021-03-07 DIAGNOSIS — Z01812 Encounter for preprocedural laboratory examination: Secondary | ICD-10-CM | POA: Diagnosis not present

## 2021-03-07 LAB — SURGICAL PCR SCREEN
MRSA, PCR: NEGATIVE
Staphylococcus aureus: NEGATIVE

## 2021-03-07 LAB — CBC WITH DIFFERENTIAL/PLATELET
Abs Immature Granulocytes: 0.01 10*3/uL (ref 0.00–0.07)
Basophils Absolute: 0 10*3/uL (ref 0.0–0.1)
Basophils Relative: 1 %
Eosinophils Absolute: 0.1 10*3/uL (ref 0.0–0.5)
Eosinophils Relative: 1 %
HCT: 38.9 % — ABNORMAL LOW (ref 39.0–52.0)
Hemoglobin: 13.1 g/dL (ref 13.0–17.0)
Immature Granulocytes: 0 %
Lymphocytes Relative: 29 %
Lymphs Abs: 1.3 10*3/uL (ref 0.7–4.0)
MCH: 30.6 pg (ref 26.0–34.0)
MCHC: 33.7 g/dL (ref 30.0–36.0)
MCV: 90.9 fL (ref 80.0–100.0)
Monocytes Absolute: 0.3 10*3/uL (ref 0.1–1.0)
Monocytes Relative: 6 %
Neutro Abs: 2.7 10*3/uL (ref 1.7–7.7)
Neutrophils Relative %: 63 %
Platelets: 274 10*3/uL (ref 150–400)
RBC: 4.28 MIL/uL (ref 4.22–5.81)
RDW: 13.1 % (ref 11.5–15.5)
WBC: 4.3 10*3/uL (ref 4.0–10.5)
nRBC: 0 % (ref 0.0–0.2)

## 2021-03-07 LAB — BASIC METABOLIC PANEL
Anion gap: 8 (ref 5–15)
BUN: 27 mg/dL — ABNORMAL HIGH (ref 8–23)
CO2: 25 mmol/L (ref 22–32)
Calcium: 9.5 mg/dL (ref 8.9–10.3)
Chloride: 103 mmol/L (ref 98–111)
Creatinine, Ser: 1.01 mg/dL (ref 0.61–1.24)
GFR, Estimated: >60 mL/min (ref >60)
Glucose, Bld: 151 mg/dL — ABNORMAL HIGH (ref 70–99)
Potassium: 3.8 mmol/L (ref 3.5–5.1)
Sodium: 136 mmol/L (ref 135–145)

## 2021-03-07 LAB — SARS CORONAVIRUS 2 (TAT 6-24 HRS): SARS Coronavirus 2: NEGATIVE

## 2021-03-07 LAB — GLUCOSE, CAPILLARY: Glucose-Capillary: 182 mg/dL — ABNORMAL HIGH (ref 70–99)

## 2021-03-07 NOTE — Progress Notes (Signed)
PCP - Dr. Karle Plumber Cardiologist - Dr. Lyman Bishop- Patient is scheduled to see Dr. Debara Pickett for the first time today.  PPM/ICD - n/a Device Orders - n/a Rep Notified - n/a  Chest x-ray - 07/26/20 EKG - 03/03/21 Stress Test - 2005 ECHO - 05/02/14 Cardiac Cath - 2007  Sleep Study - denies CPAP - n/a  CBG today- 182. Patient states he ate a large breakfast. Cheese, eggs, fruit, ensure, bacon,and toast.  Fasting blood sugar- low 100's per patient  Checks Blood Sugar every 2-3 days   Blood Thinner Instructions: Patient states he was instructed to take his last dose of apixaban Arne Cleveland) on Saturday 03/08/21.   Aspirin Instructions: n/a  ERAS Protcol - No PRE-SURGERY Ensure or G2- n/a  COVID TEST- 03/07/21 at PAT appointment. Pending.    Anesthesia review: Yes. Spoke with Karoline Caldwell, PA to notify that patient has an appointment with Dr. Debara Pickett this afternoon for cardiac clearance.   Patient denies shortness of breath, fever, cough and chest pain at PAT appointment   All instructions explained to the patient, with a verbal understanding of the material. Patient agrees to go over the instructions while at home for a better understanding. Patient also instructed to self quarantine after being tested for COVID-19. The opportunity to ask questions was provided.

## 2021-03-07 NOTE — Patient Instructions (Signed)
Medication Instructions:  Your physician recommends that you continue on your current medications as directed. Please refer to the Current Medication list given to you today.  *If you need a refill on your cardiac medications before your next appointment, please call your pharmacy*   Follow-Up: At [facility], you and your health needs are our priority.  As part of our continuing mission to provide you with exceptional heart care, we have created designated Provider Care Teams.  These Care Teams include your primary Cardiologist (physician) and Advanced Practice Providers (APPs -  Physician Assistants and Nurse Practitioners) who all work together to provide you with the care you need, when you need it.  We recommend signing up for the patient portal called "MyChart".  Sign up information is provided on this After Visit Summary.  MyChart is used to connect with patients for Virtual Visits (Telemedicine).  Patients are able to view lab/test results, encounter notes, upcoming appointments, etc.  Non-urgent messages can be sent to your provider as well.   To learn more about what you can do with MyChart, go to [email or web address].    Your next appointment:   AS NEEDED with Dr. Debara Pickett   Other Instructions Dr. Debara Pickett said you are cleared for surgery

## 2021-03-07 NOTE — Progress Notes (Signed)
OFFICE CONSULT NOTE  Chief Complaint:  Preoperative risk assessment  Primary Care Physician: [name], MD  HPI:  [name] is a 70 y.o. male who is being seen today for the evaluation of preoperative risk at the request of [name], MD. this is a pleasant 70 year old male kindly referred for evaluation management of preoperative risk.  He has 3-year history of prostate cancer and is scheduled to have some radiation therapy.  Was also struggling with back issues and is in a wheelchair today.  He was found to have a disc issue and a scheduled for microdiscectomy with Dr. Trenton Gammon on September 20.  He saw his PCP Dr. [name] who performed an EKG that showed some lateral T wave changes/inversions.  He was referred for cardiology evaluation due to this.  He does have a family history of heart disease in his father who had bypass and redo bypass.  He also has diabetes and multiple prior strokes on Eliquis.  He reports he is somewhat active but limited by his back.  He is able to riding mow his lawn and does some weed eating.  I am not convinced he does 4 metabolic equivalents of activity but he denies any chest pain, shortness of breath, worsening fatigue, syncope presyncope or palpitations.  PMHx:  Past Medical History:  Diagnosis Date   Abscess of anal and rectal regions 07/15/2007   Qualifier: Diagnosis of  By: Amil Amen MD, Elizabeth     Atrial fibrillation [facility])    Diabetes mellitus without complication [facility]) Dx 9741   FISTULA, ANAL 01/27/2007   Qualifier: Diagnosis of  By: Amil Amen MD, Elizabeth     Hypertension Dx 2008   Prostate cancer [facility])    Stroke [facility])     Past Surgical History:  Procedure Laterality Date   CATARACT EXTRACTION  06/2017   CYSTOSCOPY  05/2017   INTERCOSTAL NERVE BLOCK Left 05/13/2020   Procedure: INTERCOSTAL NERVE BLOCK;  Surgeon: Lajuana Matte, MD;  Location: [facility] OR;  Service: Thoracic;  Laterality: Left;   PROSTATE BIOPSY       FAMHx:  Family History  Problem Relation Age of Onset   Diabetes Mother    Breast cancer Mother    Diabetes Father    Breast cancer Sister    Prostate cancer Neg Hx    Colon cancer Neg Hx    Pancreatic cancer Neg Hx     SOCHx:   reports that he has been smoking cigarettes. He has a 2.50 pack-year smoking history. He has never used smokeless tobacco. He reports that he does not drink alcohol and does not use drugs.  ALLERGIES:  Allergies  Allergen Reactions   Lisinopril Swelling    Throat swelling    ROS: Pertinent items noted in HPI and remainder of comprehensive ROS otherwise negative.  HOME MEDS: Current Outpatient Medications on File Prior to Visit  Medication Sig Dispense Refill   amLODipine (NORVASC) 10 MG tablet Take 1 tablet (10 mg total) by mouth daily. 90 tablet 1   apixaban (ELIQUIS) 5 MG TABS tablet TAKE 1 TABLET BY MOUTH TWICE DAILY. 60 tablet 2   B Complex Vitamins (VITAMIN B COMPLEX) TABS Take 1 tablet by mouth daily.     Blood Glucose Monitoring Suppl (TRUE METRIX METER) w/Device KIT Use as directed 1 kit 0   diclofenac Sodium (VOLTAREN) 1 % GEL Apply 2 g topically 3 (three) times daily as needed (pain). 100 g 1   fluticasone (FLONASE) 50 MCG/ACT  nasal spray Place 1 spray into both nostrils daily as needed for allergies or rhinitis.     gabapentin (NEURONTIN) 100 MG capsule TAKE 1 CAPSULE (100 MG TOTAL) BY MOUTH 3 (THREE) TIMES DAILY. NEEDED FOR NUMBNESS 90 capsule 0   GlucoCom Lancets MISC 100 Units by Does not apply route once. 100 each 0   glucose blood (TRUE METRIX BLOOD GLUCOSE TEST) test strip Use as instructed 100 each 12   hydrOXYzine (ATARAX/VISTARIL) 25 MG tablet TAKE 1 TABLET (25 MG TOTAL) BY MOUTH 2 (TWO) TIMES DAILY AS NEEDED. 30 tablet 2   metFORMIN (GLUCOPHAGE) 500 MG tablet Take 1 tablet (500 mg total) by mouth daily with breakfast. 30 tablet 2   Multiple Vitamin (MULTIVITAMIN WITH MINERALS) TABS tablet Take 1 tablet by mouth daily.     No  current facility-administered medications on file prior to visit.    LABS/IMAGING: Results for orders placed or performed during the hospital encounter of 03/07/21 (from the past 48 hour(s))  Glucose, capillary     Status: Abnormal   Collection Time: 03/07/21  8:20 AM  Result Value Ref Range   Glucose-Capillary 182 (H) 70 - 99 mg/dL    Comment: Glucose reference range applies only to samples taken after fasting for at least 8 hours.  Surgical pcr screen     Status: None   Collection Time: 03/07/21  8:40 AM   Specimen: Nasal Mucosa; Nasal Swab  Result Value Ref Range   MRSA, PCR NEGATIVE NEGATIVE   Staphylococcus aureus NEGATIVE NEGATIVE    Comment: (NOTE) The Xpert SA Assay (FDA approved for NASAL specimens in patients 33 years of age and older), is one component of a comprehensive surveillance program. It is not intended to diagnose infection nor to guide or monitor treatment. Performed at [facility], [place] [address] [place], [place] [address]   SARS CORONAVIRUS 2 (TAT 6-24 HRS) Nasopharyngeal Nasopharyngeal Swab     Status: None   Collection Time: 03/07/21  8:41 AM   Specimen: Nasopharyngeal Swab  Result Value Ref Range   SARS Coronavirus 2 NEGATIVE NEGATIVE    Comment: (NOTE) SARS-CoV-2 target nucleic acids are NOT DETECTED.  The SARS-CoV-2 RNA is generally detectable in upper and lower respiratory specimens during the acute phase of infection. Negative results do not preclude SARS-CoV-2 infection, do not rule out co-infections with other pathogens, and should not be used as the sole basis for treatment or other patient management decisions. Negative results must be combined with clinical observations, patient history, and epidemiological information. The expected result is Negative.  Fact Sheet for Patients: SugarRoll.be  Fact Sheet for Healthcare Providers: https://www.woods-mathews.com/  This test is not yet approved  or cleared by the [place] FDA and  has been authorized for detection and/or diagnosis of SARS-CoV-2 by FDA under an Emergency Use Authorization (EUA). This EUA will remain  in effect (meaning this test can be used) for the duration of the COVID-19 declaration under Se ction 564(b)(1) of the Act, 21 U.S.C. section 360bbb-3(b)(1), unless the authorization is terminated or revoked sooner.  Performed at [facility], [place] [address], [place], [place] [address]   CBC WITH DIFFERENTIAL     Status: Abnormal   Collection Time: 03/07/21  9:07 AM  Result Value Ref Range   WBC 4.3 4.0 - 10.5 K/uL   RBC 4.28 4.22 - 5.81 MIL/uL   Hemoglobin 13.1 13.0 - 17.0 g/dL   HCT 38.9 (L) 39.0 - 52.0 %   MCV  90.9 80.0 - 100.0 fL   MCH 30.6 26.0 - 34.0 pg   MCHC 33.7 30.0 - 36.0 g/dL   RDW 13.1 11.5 - 15.5 %   Platelets 274 150 - 400 K/uL   nRBC 0.0 0.0 - 0.2 %   Neutrophils Relative % 63 %   Neutro Abs 2.7 1.7 - 7.7 K/uL   Lymphocytes Relative 29 %   Lymphs Abs 1.3 0.7 - 4.0 K/uL   Monocytes Relative 6 %   Monocytes Absolute 0.3 0.1 - 1.0 K/uL   Eosinophils Relative 1 %   Eosinophils Absolute 0.1 0.0 - 0.5 K/uL   Basophils Relative 1 %   Basophils Absolute 0.0 0.0 - 0.1 K/uL   Immature Granulocytes 0 %   Abs Immature Granulocytes 0.01 0.00 - 0.07 K/uL    Comment: Performed at [facility] [address], [place], [place] [address]  Basic metabolic panel     Status: Abnormal   Collection Time: 03/07/21  9:07 AM  Result Value Ref Range   Sodium 136 135 - 145 mmol/L   Potassium 3.8 3.5 - 5.1 mmol/L   Chloride 103 98 - 111 mmol/L   CO2 25 22 - 32 mmol/L   Glucose, Bld 151 (H) 70 - 99 mg/dL    Comment: Glucose reference range applies only to samples taken after fasting for at least 8 hours.   BUN 27 (H) 8 - 23 mg/dL   Creatinine, Ser 1.01 0.61 - 1.24 mg/dL   Calcium 9.5 8.9 - 10.3 mg/dL   GFR, Estimated >60 >60 mL/min    Comment: (NOTE) Calculated using the CKD-EPI Creatinine  Equation (2021)    Anion gap 8 5 - 15    Comment: Performed at [facility] [address], [place], [place] [address]   No results found.  LIPID PANEL:    Component Value Date/Time   CHOL 189 01/29/2020 1022   TRIG 123 01/29/2020 1022   HDL 58 01/29/2020 1022   CHOLHDL 3.3 01/29/2020 1022   CHOLHDL 4.2 05/02/2014 0000   VLDL 72 (H) 05/02/2014 0000   LDLCALC 109 (H) 01/29/2020 1022    WEIGHTS: Wt Readings from Last 3 Encounters:  03/07/21 188 lb (85.3 kg)  03/07/21 189 lb 14.4 oz (86.1 kg)  01/14/21 194 lb (88 kg)    VITALS: BP 126/72   Pulse 74   Ht 6' 1"  (1.854 m)   Wt 188 lb (85.3 kg)   SpO2 97%   BMI 24.80 kg/m   EXAM: General appearance: alert, no distress, and in a wheelchair Neck: no carotid bruit, no JVD, and thyroid not enlarged, symmetric, no tenderness/mass/nodules Lungs: clear to auscultation bilaterally Heart: regular rate and rhythm, S1, S2 normal, no murmur, click, rub or gallop Abdomen: soft, non-tender; bowel sounds normal; no masses,  no organomegaly Extremities: extremities normal, atraumatic, no cyanosis or edema Pulses: 2+ and symmetric Skin: Skin color, texture, turgor normal. 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   Mr. Carfagno has a history of recurrent stroke on Eliquis, type 2 diabetes and family history of coronary disease.  I am not convinced he could do 4 metabolic equivalents of activity because of his back issue.  I offered stress testing  however he did not think that it was necessary.  He is really not having any unstable anginal symptoms.  EKG previously had shown lateral T wave inversions however those have resolved  today.  This could be ischemia or up to 50-60 other causes of T wave changes.  Lead placement could have been an issue as well. His cardiac exam is benign.  He wishes to proceed with surgery which is already scheduled on Tuesday.  Is probably unlikely we can get stress testing any had done before then.  He chooses to proceed with surgery and understands that there may be an increased risk but he is excepting of that.  I would tend to agree with that assessment.  From my standpoint then cleared for surgery.  Can follow-up with me as needed and will continue to address his cardiovascular risk factors.  For the kind referral.  Follow-up with me as needed.  Pixie Casino, MD, [facility], [place] Director of the [facility]  [facility] Diplomate of the American Board of Clinical Lipidology Attending Cardiologist  Direct Dial: [phone]  Fax: [phone]  Website:  [email or web address]  Nadean Corwin Caelen Higinbotham 03/07/2021, 2:11 PM

## 2021-03-10 ENCOUNTER — Telehealth: Payer: Self-pay | Admitting: Internal Medicine

## 2021-03-10 MED ORDER — GLIMEPIRIDE 1 MG PO TABS
1.0000 mg | ORAL_TABLET | Freq: Every day | ORAL | 3 refills | Status: DC
  Filled 2021-03-10: qty 30, 30d supply, fill #0
  Filled 2021-03-25: qty 30, 30d supply, fill #1

## 2021-03-10 NOTE — Progress Notes (Signed)
Anesthesia Chart Review:  Patient had preop cardiac eval by Dr. Debara Pickett on 03/07/2021.  Per note, " [name] has a history of recurrent stroke on Eliquis, type 2 diabetes and family history of coronary disease.  I am not convinced he could do 4 metabolic equivalents of activity because of his back issue.  I offered stress testing however he did not think that it was necessary.  He is really not having any unstable anginal symptoms.  EKG previously had shown lateral T wave inversions however those have resolved today.  This could be ischemia or up to 50-60 other causes of T wave changes.  Lead placement could have been an issue as well. His cardiac exam is benign.  He wishes to proceed with surgery which is already scheduled on Tuesday.  Is probably unlikely we can get stress testing any had done before then.  He chooses to proceed with surgery and understands that there may be an increased risk but he is excepting of that.  I would tend to agree with that assessment.  From my standpoint then cleared for surgery.  Can follow-up with me as needed and will continue to address his cardiovascular risk factors."  Patient reported last dose Eliquis 03/08/2021.  Preop labs reviewed, unremarkable.  EKG 03/07/2021: Sinus bradycardia. Rate 57. Inferior infarct , age undetermined. T wave abnormality, consider lateral ischemia  CHEST - 2 VIEW 07/26/20:   COMPARISON:  Chest radiograph dated 05/27/2020   FINDINGS: No focal consolidation, pleural effusion or pneumothorax. The cardiac silhouette is within limits. No acute osseous pathology.   IMPRESSION: No active cardiopulmonary disease.   TTE 05/02/2014: - Procedure narrative: Transthoracic echocardiography. Image    quality was adequate. The study was technically difficult due to    variations in respirations.  - Left ventricle: The cavity size was normal. Wall thickness was    increased in a pattern of mild LVH. Systolic function was normal.    The  estimated ejection fraction was in the range of 60% to 65%.    Wall motion was normal; there were no regional wall motion    abnormalities. Doppler parameters are consistent with abnormal    left ventricular relaxation (grade 1 diastolic dysfunction).  - Aortic valve: There was no stenosis.  - Mitral valve: There was no significant regurgitation.  - Right ventricle: The cavity size was normal. Systolic function    was normal.  - Pulmonary arteries: No complete TR doppler jet so unable to    estimate PA systolic pressure.  - Inferior vena cava: The vessel was normal in size. The    respirophasic diameter changes were in the normal range (>= 50%),    consistent with normal central venous pressure.   Impressions:   - Normal LV size and systolic function, EF [id number]. Mild LVH. Normal    RV size and systolic function. No significant valvular    abnormalities.     Wynonia Musty [facility] Short Stay Center/Anesthesiology Phone [phone] 03/10/2021 11:34 AM

## 2021-03-10 NOTE — Telephone Encounter (Signed)
Copied from [place] [phone]. Topic: General - Inquiry >> Mar 10, 2021  9:34 AM Loma Boston wrote: Reason for CRM: metFORMIN (GLUCOPHAGE) 500 MG tablet Medication Date: 03/04/2021 Department: [place] Ordering/Authorizing: [name], MD  Pt has called and states that needs something to replace Metformin. Pt states is making very constipated and is having to take a laxative/stool oftener, Sinicot. This is giving him bad cramps. Pt wants a fu from nurse [phone]

## 2021-03-10 NOTE — Telephone Encounter (Signed)
Will forward to provider  

## 2021-03-10 NOTE — Anesthesia Preprocedure Evaluation (Addendum)
Anesthesia Evaluation  Patient identified by MRN, date of birth, ID band Patient awake    Reviewed: Allergy & Precautions, NPO status , Patient's Chart, lab work & pertinent test results  Airway Mallampati: III  TM Distance: >3 FB Neck ROM: Full    Dental  (+) Edentulous Upper, Missing   Pulmonary Current Smoker and Patient abstained from smoking.,    Pulmonary exam normal breath sounds clear to auscultation       Cardiovascular hypertension, Pt. on medications Normal cardiovascular exam+ dysrhythmias Atrial Fibrillation  Rhythm:Regular Rate:Normal  ECG: SR, rate 74   Neuro/Psych Hemiparesis affecting left side as late effect of stroke  CVA, Residual Symptoms negative psych ROS   GI/Hepatic negative GI ROS, Neg liver ROS,   Endo/Other  diabetes, Oral Hypoglycemic Agents  Renal/GU negative Renal ROS     Musculoskeletal  (+) Arthritis , Wheelchair bound due to pain   Abdominal   Peds  Hematology  (+) REFUSES BLOOD PRODUCTS,   Anesthesia Other Findings Lumbar radiculopathy  Reproductive/Obstetrics                          Anesthesia Physical Anesthesia Plan  ASA: 3  Anesthesia Plan: General   Post-op Pain Management:    Induction: Intravenous  PONV Risk Score and Plan: 1 and Ondansetron, Dexamethasone, Midazolam and Treatment may vary due to age or medical condition  Airway Management Planned: Oral ETT  Additional Equipment:   Intra-op Plan:   Post-operative Plan: Extubation in OR  Informed Consent: I have reviewed the patients History and Physical, chart, labs and discussed the procedure including the risks, benefits and alternatives for the proposed anesthesia with the patient or authorized representative who has indicated his/her understanding and acceptance.     Dental advisory given  Plan Discussed with: CRNA  Anesthesia Plan Comments: (PAT note by Karoline Caldwell,  PA-C: Patient had preop cardiac eval by Dr. Debara Pickett on 03/07/2021.  Per note, "Mr. Compean has a history of recurrent stroke on Eliquis, type 2 diabetes and family history of coronary disease. I am not convinced he could do 4 metabolic equivalents of activity because of his back issue. I offered stress testing however he did not think that it was necessary. He is really not having any unstable anginal symptoms. EKG previously had shown lateral T wave inversions however those have resolved today. This could be ischemia or up to 50-60 other causes of T wave changes. Lead placement could have been an issue as well. His cardiac exam is benign. He wishes to proceed with surgery which is already scheduled on Tuesday. Is probably unlikely we can get stress testing any had done before then. He chooses to proceed with surgery and understands that there may be an increased risk but he is excepting of that. I would tend to agree with that assessment. From my standpoint then cleared for surgery. Can follow-up with me as needed and will continue to address his cardiovascular risk factors."  Patient reported last dose Eliquis 03/08/2021.  Preop labs reviewed, unremarkable.  EKG 03/07/2021: Sinus bradycardia. Rate 57. Inferior infarct , age undetermined. T wave abnormality, consider lateral ischemia  CHEST - 2 VIEW 07/26/20:  COMPARISON: Chest radiograph dated 05/27/2020  FINDINGS: No focal consolidation, pleural effusion or pneumothorax. The cardiac silhouette is within limits. No acute osseous pathology.  IMPRESSION: No active cardiopulmonary disease.  TTE 05/02/2014: - Procedure narrative: Transthoracic echocardiography. Image  quality was adequate. The study was technically difficult due  to  variations in respirations.  - Left ventricle: The cavity size was normal. Wall thickness was  increased in a pattern of mild LVH. Systolic function was normal.  The estimated ejection  fraction was in the range of 60% to 65%.  Wall motion was normal; there were no regional wall motion  abnormalities. Doppler parameters are consistent with abnormal  left ventricular relaxation (grade 1 diastolic dysfunction).  - Aortic valve: There was no stenosis.  - Mitral valve: There was no significant regurgitation.  - Right ventricle: The cavity size was normal. Systolic function  was normal.  - Pulmonary arteries: No complete TR doppler jet so unable to  estimate PA systolic pressure.  - Inferior vena cava: The vessel was normal in size. The  respirophasic diameter changes were in the normal range (>= 50%),  consistent with normal central venous pressure.   Impressions:   - Normal LV size and systolic function, EF 16-10%. Mild LVH. Normal  RV size and systolic function. No significant valvular  abnormalities.    )      Anesthesia Quick Evaluation

## 2021-03-11 ENCOUNTER — Ambulatory Visit (HOSPITAL_COMMUNITY): Payer: Medicare HMO

## 2021-03-11 ENCOUNTER — Other Ambulatory Visit: Payer: Self-pay

## 2021-03-11 ENCOUNTER — Ambulatory Visit (HOSPITAL_COMMUNITY): Payer: Medicare HMO | Admitting: Physician Assistant

## 2021-03-11 ENCOUNTER — Ambulatory Visit (HOSPITAL_COMMUNITY): Payer: Medicare HMO | Admitting: Anesthesiology

## 2021-03-11 ENCOUNTER — Encounter (HOSPITAL_COMMUNITY): Payer: Self-pay | Admitting: Neurosurgery

## 2021-03-11 ENCOUNTER — Observation Stay (HOSPITAL_COMMUNITY)
Admission: RE | Admit: 2021-03-11 | Discharge: 2021-03-11 | Disposition: A | Discharge status: Home / Self Care | Payer: Medicare HMO | Attending: Neurosurgery | Admitting: Neurosurgery

## 2021-03-11 ENCOUNTER — Encounter (HOSPITAL_COMMUNITY)
Admission: RE | Disposition: A | Discharge status: Home / Self Care | Payer: Self-pay | Source: Home / Self Care | Attending: Neurosurgery

## 2021-03-11 DIAGNOSIS — Z8673 Personal history of transient ischemic attack (TIA), and cerebral infarction without residual deficits: Secondary | ICD-10-CM | POA: Diagnosis not present

## 2021-03-11 DIAGNOSIS — M5116 Intervertebral disc disorders with radiculopathy, lumbar region: Principal | ICD-10-CM | POA: Insufficient documentation

## 2021-03-11 DIAGNOSIS — E1169 Type 2 diabetes mellitus with other specified complication: Secondary | ICD-10-CM | POA: Diagnosis not present

## 2021-03-11 DIAGNOSIS — Z79899 Other long term (current) drug therapy: Secondary | ICD-10-CM | POA: Insufficient documentation

## 2021-03-11 DIAGNOSIS — Z7984 Long term (current) use of oral hypoglycemic drugs: Secondary | ICD-10-CM | POA: Diagnosis not present

## 2021-03-11 DIAGNOSIS — F17210 Nicotine dependence, cigarettes, uncomplicated: Secondary | ICD-10-CM | POA: Insufficient documentation

## 2021-03-11 DIAGNOSIS — E785 Hyperlipidemia, unspecified: Secondary | ICD-10-CM | POA: Diagnosis not present

## 2021-03-11 DIAGNOSIS — E119 Type 2 diabetes mellitus without complications: Secondary | ICD-10-CM | POA: Insufficient documentation

## 2021-03-11 DIAGNOSIS — Z7901 Long term (current) use of anticoagulants: Secondary | ICD-10-CM | POA: Insufficient documentation

## 2021-03-11 DIAGNOSIS — I10 Essential (primary) hypertension: Secondary | ICD-10-CM | POA: Insufficient documentation

## 2021-03-11 DIAGNOSIS — M5416 Radiculopathy, lumbar region: Secondary | ICD-10-CM | POA: Diagnosis present

## 2021-03-11 DIAGNOSIS — I4891 Unspecified atrial fibrillation: Secondary | ICD-10-CM | POA: Insufficient documentation

## 2021-03-11 DIAGNOSIS — Z981 Arthrodesis status: Secondary | ICD-10-CM | POA: Diagnosis not present

## 2021-03-11 DIAGNOSIS — Z419 Encounter for procedure for purposes other than remedying health state, unspecified: Secondary | ICD-10-CM

## 2021-03-11 DIAGNOSIS — Z8546 Personal history of malignant neoplasm of prostate: Secondary | ICD-10-CM | POA: Diagnosis not present

## 2021-03-11 HISTORY — PX: LUMBAR LAMINECTOMY/DECOMPRESSION MICRODISCECTOMY: SHX5026

## 2021-03-11 LAB — GLUCOSE, CAPILLARY
Glucose-Capillary: 152 mg/dL — ABNORMAL HIGH (ref 70–99)
Glucose-Capillary: 145 mg/dL — ABNORMAL HIGH (ref 70–99)

## 2021-03-11 SURGERY — LUMBAR LAMINECTOMY/DECOMPRESSION MICRODISCECTOMY 1 LEVEL
Anesthesia: General | Site: Back | Laterality: Right

## 2021-03-11 MED ORDER — PHENYLEPHRINE 40 MCG/ML (10ML) SYRINGE FOR IV PUSH (FOR BLOOD PRESSURE SUPPORT)
PREFILLED_SYRINGE | INTRAVENOUS | Status: AC
  Filled 2021-03-11: qty 10

## 2021-03-11 MED ORDER — KETOROLAC TROMETHAMINE 30 MG/ML IJ SOLN
INTRAMUSCULAR | Status: AC
  Filled 2021-03-11: qty 1

## 2021-03-11 MED ORDER — FENTANYL CITRATE (PF) 250 MCG/5ML IJ SOLN
INTRAMUSCULAR | Status: DC | PRN
  Administered 2021-03-11: 150 ug via INTRAVENOUS

## 2021-03-11 MED ORDER — KETOROLAC TROMETHAMINE 15 MG/ML IJ SOLN: 15.0000 mg | Freq: Four times a day (QID) | INTRAMUSCULAR | Status: DC

## 2021-03-11 MED ORDER — FENTANYL CITRATE (PF) 100 MCG/2ML IJ SOLN
INTRAMUSCULAR | Status: AC
  Filled 2021-03-11: qty 2

## 2021-03-11 MED ORDER — 0.9 % SODIUM CHLORIDE (POUR BTL) OPTIME
TOPICAL | Status: DC | PRN
  Administered 2021-03-11: 1000 mL

## 2021-03-11 MED ORDER — PROPOFOL 10 MG/ML IV BOLUS
INTRAVENOUS | Status: AC
  Filled 2021-03-11: qty 20

## 2021-03-11 MED ORDER — FLUTICASONE PROPIONATE 50 MCG/ACT NA SUSP: 1.0000 | Freq: Every day | NASAL | Status: DC | PRN

## 2021-03-11 MED ORDER — SODIUM CHLORIDE 0.9% FLUSH: 3.0000 mL | INTRAVENOUS | Status: DC | PRN

## 2021-03-11 MED ORDER — BUPIVACAINE HCL (PF) 0.25 % IJ SOLN
INTRAMUSCULAR | Status: DC | PRN
  Administered 2021-03-11: 20 mL

## 2021-03-11 MED ORDER — GABAPENTIN 100 MG PO CAPS
100.0000 mg | ORAL_CAPSULE | Freq: Three times a day (TID) | ORAL | Status: DC
  Administered 2021-03-11: 100 mg via ORAL
  Filled 2021-03-11: qty 1

## 2021-03-11 MED ORDER — BUPIVACAINE HCL (PF) 0.25 % IJ SOLN
INTRAMUSCULAR | Status: AC
  Filled 2021-03-11: qty 30

## 2021-03-11 MED ORDER — CHLORHEXIDINE GLUCONATE CLOTH 2 % EX PADS: 6.0000 | MEDICATED_PAD | Freq: Once | CUTANEOUS | Status: DC

## 2021-03-11 MED ORDER — HYDROCODONE-ACETAMINOPHEN 5-325 MG PO TABS: 1.0000 | ORAL_TABLET | ORAL | 0 refills | Status: DC | PRN

## 2021-03-11 MED ORDER — ONDANSETRON HCL 4 MG/2ML IJ SOLN
INTRAMUSCULAR | Status: AC
  Filled 2021-03-11: qty 2

## 2021-03-11 MED ORDER — CYCLOBENZAPRINE HCL 10 MG PO TABS: 10.0000 mg | ORAL_TABLET | Freq: Three times a day (TID) | ORAL | 0 refills | Status: DC | PRN

## 2021-03-11 MED ORDER — PROPOFOL 10 MG/ML IV BOLUS
INTRAVENOUS | Status: DC | PRN
  Administered 2021-03-11: 150 mg via INTRAVENOUS

## 2021-03-11 MED ORDER — INSULIN ASPART 100 UNIT/ML IJ SOLN: 0.0000 [IU] | Freq: Three times a day (TID) | INTRAMUSCULAR | Status: DC

## 2021-03-11 MED ORDER — LACTATED RINGERS IV SOLN: INTRAVENOUS | Status: DC

## 2021-03-11 MED ORDER — B COMPLEX-C PO TABS: 1.0000 | ORAL_TABLET | Freq: Every day | ORAL | Status: DC

## 2021-03-11 MED ORDER — FENTANYL CITRATE (PF) 100 MCG/2ML IJ SOLN
25.0000 ug | INTRAMUSCULAR | Status: DC | PRN
  Administered 2021-03-11 (×2): 50 ug via INTRAVENOUS

## 2021-03-11 MED ORDER — ONDANSETRON HCL 4 MG/2ML IJ SOLN: 4.0000 mg | Freq: Once | INTRAMUSCULAR | Status: DC | PRN

## 2021-03-11 MED ORDER — ROCURONIUM BROMIDE 10 MG/ML (PF) SYRINGE
PREFILLED_SYRINGE | INTRAVENOUS | Status: AC
  Filled 2021-03-11: qty 10

## 2021-03-11 MED ORDER — ACETAMINOPHEN 325 MG PO TABS: 650.0000 mg | ORAL_TABLET | ORAL | Status: DC | PRN

## 2021-03-11 MED ORDER — FENTANYL CITRATE (PF) 250 MCG/5ML IJ SOLN
INTRAMUSCULAR | Status: AC
  Filled 2021-03-11: qty 5

## 2021-03-11 MED ORDER — DEXAMETHASONE SODIUM PHOSPHATE 10 MG/ML IJ SOLN
10.0000 mg | Freq: Once | INTRAMUSCULAR | Status: AC
  Administered 2021-03-11: 10 mg via INTRAVENOUS
  Filled 2021-03-11: qty 1

## 2021-03-11 MED ORDER — THROMBIN 5000 UNITS EX SOLR
CUTANEOUS | Status: DC | PRN
Start: 2021-03-11 — End: 2021-03-11
  Administered 2021-03-11 (×2): 5000 [IU] via TOPICAL

## 2021-03-11 MED ORDER — THROMBIN 5000 UNITS EX SOLR
CUTANEOUS | Status: AC
  Filled 2021-03-11: qty 10000

## 2021-03-11 MED ORDER — ORAL CARE MOUTH RINSE: 15.0000 mL | Freq: Once | OROMUCOSAL | Status: AC

## 2021-03-11 MED ORDER — ONDANSETRON HCL 4 MG PO TABS: 4.0000 mg | ORAL_TABLET | Freq: Four times a day (QID) | ORAL | Status: DC | PRN

## 2021-03-11 MED ORDER — HEMOSTATIC AGENTS (NO CHARGE) OPTIME
TOPICAL | Status: DC | PRN
  Administered 2021-03-11: 1 via TOPICAL

## 2021-03-11 MED ORDER — PHENYLEPHRINE HCL-NACL 20-0.9 MG/250ML-% IV SOLN
INTRAVENOUS | Status: DC | PRN
  Administered 2021-03-11: 25 ug/min via INTRAVENOUS

## 2021-03-11 MED ORDER — PHENOL 1.4 % MT LIQD: 1.0000 | OROMUCOSAL | Status: DC | PRN

## 2021-03-11 MED ORDER — GLIMEPIRIDE 2 MG PO TABS: 1.0000 mg | ORAL_TABLET | Freq: Every day | ORAL | Status: DC

## 2021-03-11 MED ORDER — KETOROLAC TROMETHAMINE 30 MG/ML IJ SOLN
INTRAMUSCULAR | Status: DC | PRN
  Administered 2021-03-11: 15 mg via INTRAVENOUS

## 2021-03-11 MED ORDER — ACETAMINOPHEN 650 MG RE SUPP: 650.0000 mg | RECTAL | Status: DC | PRN

## 2021-03-11 MED ORDER — FENTANYL CITRATE (PF) 100 MCG/2ML IJ SOLN
50.0000 ug | INTRAMUSCULAR | Status: DC | PRN
  Administered 2021-03-11: 50 ug via INTRAVENOUS

## 2021-03-11 MED ORDER — SUGAMMADEX SODIUM 200 MG/2ML IV SOLN
INTRAVENOUS | Status: DC | PRN
  Administered 2021-03-11: 200 mg via INTRAVENOUS

## 2021-03-11 MED ORDER — CHLORHEXIDINE GLUCONATE 0.12 % MT SOLN
15.0000 mL | Freq: Once | OROMUCOSAL | Status: AC
  Administered 2021-03-11: 15 mL via OROMUCOSAL
  Filled 2021-03-11: qty 15

## 2021-03-11 MED ORDER — AMLODIPINE BESYLATE 10 MG PO TABS: 10.0000 mg | ORAL_TABLET | Freq: Every day | ORAL | Status: DC

## 2021-03-11 MED ORDER — HYDROCODONE-ACETAMINOPHEN 10-325 MG PO TABS
2.0000 | ORAL_TABLET | ORAL | Status: DC | PRN
Start: 2021-03-11 — End: 2021-03-11
  Administered 2021-03-11: 2 via ORAL
  Filled 2021-03-11: qty 2

## 2021-03-11 MED ORDER — SODIUM CHLORIDE 0.9% FLUSH
3.0000 mL | Freq: Two times a day (BID) | INTRAVENOUS | Status: DC
  Administered 2021-03-11: 3 mL via INTRAVENOUS

## 2021-03-11 MED ORDER — PHENYLEPHRINE 40 MCG/ML (10ML) SYRINGE FOR IV PUSH (FOR BLOOD PRESSURE SUPPORT)
PREFILLED_SYRINGE | INTRAVENOUS | Status: DC | PRN
  Administered 2021-03-11: 120 ug via INTRAVENOUS
  Administered 2021-03-11: 40 ug via INTRAVENOUS
  Administered 2021-03-11 (×2): 80 ug via INTRAVENOUS

## 2021-03-11 MED ORDER — SODIUM CHLORIDE 0.9 % IV SOLN
250.0000 mL | INTRAVENOUS | Status: DC
  Administered 2021-03-11: 250 mL via INTRAVENOUS

## 2021-03-11 MED ORDER — CYCLOBENZAPRINE HCL 10 MG PO TABS: 10.0000 mg | ORAL_TABLET | Freq: Three times a day (TID) | ORAL | Status: DC | PRN

## 2021-03-11 MED ORDER — HYDROMORPHONE HCL 1 MG/ML IJ SOLN: 1.0000 mg | INTRAMUSCULAR | Status: DC | PRN

## 2021-03-11 MED ORDER — ONDANSETRON HCL 4 MG/2ML IJ SOLN
INTRAMUSCULAR | Status: DC | PRN
  Administered 2021-03-11: 4 mg via INTRAVENOUS

## 2021-03-11 MED ORDER — ADULT MULTIVITAMIN W/MINERALS CH: 1.0000 | ORAL_TABLET | Freq: Every day | ORAL | Status: DC

## 2021-03-11 MED ORDER — MENTHOL 3 MG MT LOZG: 1.0000 | LOZENGE | OROMUCOSAL | Status: DC | PRN

## 2021-03-11 MED ORDER — CEFAZOLIN SODIUM-DEXTROSE 1-4 GM/50ML-% IV SOLN: 1.0000 g | Freq: Three times a day (TID) | INTRAVENOUS | Status: DC

## 2021-03-11 MED ORDER — ACETAMINOPHEN 10 MG/ML IV SOLN: 1000.0000 mg | Freq: Once | INTRAVENOUS | Status: DC | PRN

## 2021-03-11 MED ORDER — VITAMIN B COMPLEX PO TABS: 1.0000 | ORAL_TABLET | Freq: Every day | ORAL | Status: DC

## 2021-03-11 MED ORDER — CEFAZOLIN SODIUM-DEXTROSE 2-4 GM/100ML-% IV SOLN
2.0000 g | INTRAVENOUS | Status: AC
  Administered 2021-03-11: 2 g via INTRAVENOUS

## 2021-03-11 MED ORDER — ONDANSETRON HCL 4 MG/2ML IJ SOLN: 4.0000 mg | Freq: Four times a day (QID) | INTRAMUSCULAR | Status: DC | PRN

## 2021-03-11 MED ORDER — LIDOCAINE 2% (20 MG/ML) 5 ML SYRINGE
INTRAMUSCULAR | Status: AC
  Filled 2021-03-11: qty 5

## 2021-03-11 MED ORDER — AMISULPRIDE (ANTIEMETIC) 5 MG/2ML IV SOLN: 10.0000 mg | Freq: Once | INTRAVENOUS | Status: DC | PRN

## 2021-03-11 MED ORDER — ROCURONIUM BROMIDE 10 MG/ML (PF) SYRINGE
PREFILLED_SYRINGE | INTRAVENOUS | Status: DC | PRN
  Administered 2021-03-11: 50 mg via INTRAVENOUS
  Administered 2021-03-11: 20 mg via INTRAVENOUS

## 2021-03-11 MED ORDER — HYDROXYZINE HCL 25 MG PO TABS: 50.0000 mg | ORAL_TABLET | Freq: Four times a day (QID) | ORAL | Status: DC | PRN

## 2021-03-11 MED ORDER — LIDOCAINE 2% (20 MG/ML) 5 ML SYRINGE
INTRAMUSCULAR | Status: DC | PRN
  Administered 2021-03-11: 60 mg via INTRAVENOUS

## 2021-03-11 MED ORDER — HYDROCODONE-ACETAMINOPHEN 5-325 MG PO TABS: 1.0000 | ORAL_TABLET | ORAL | Status: DC | PRN

## 2021-03-11 SURGICAL SUPPLY — 51 items
ADH SKN CLS APL DERMABOND .7 (GAUZE/BANDAGES/DRESSINGS) ×1
APL SKNCLS STERI-STRIP NONHPOA (GAUZE/BANDAGES/DRESSINGS) ×1
BAG COUNTER SPONGE SURGICOUNT (BAG) ×4 IMPLANT
BAG DECANTER FOR FLEXI CONT (MISCELLANEOUS) ×2 IMPLANT
BAG SPNG CNTER NS LX DISP (BAG) ×2
BAND INSRT 18 STRL LF DISP RB (MISCELLANEOUS) ×2
BAND RUBBER #18 3X1/16 STRL (MISCELLANEOUS) ×4 IMPLANT
BENZOIN TINCTURE PRP APPL 2/3 (GAUZE/BANDAGES/DRESSINGS) ×2 IMPLANT
BLADE CLIPPER SURG (BLADE) IMPLANT
BUR CUTTER 7.0 ROUND (BURR) ×2 IMPLANT
CANISTER SUCT 3000ML PPV (MISCELLANEOUS) ×2 IMPLANT
CARTRIDGE OIL MAESTRO DRILL (MISCELLANEOUS) ×1 IMPLANT
CLSR STERI-STRIP ANTIMIC 1/2X4 (GAUZE/BANDAGES/DRESSINGS) ×2 IMPLANT
DECANTER SPIKE VIAL GLASS SM (MISCELLANEOUS) ×2 IMPLANT
DERMABOND ADVANCED (GAUZE/BANDAGES/DRESSINGS) ×1
DERMABOND ADVANCED .7 DNX12 (GAUZE/BANDAGES/DRESSINGS) ×1 IMPLANT
DIFFUSER DRILL AIR PNEUMATIC (MISCELLANEOUS) ×2 IMPLANT
DRAPE HALF SHEET 40X57 (DRAPES) IMPLANT
DRAPE LAPAROTOMY 100X72X124 (DRAPES) ×2 IMPLANT
DRAPE MICROSCOPE LEICA (MISCELLANEOUS) ×2 IMPLANT
DRAPE SURG 17X23 STRL (DRAPES) ×4 IMPLANT
DRSG OPSITE POSTOP 3X4 (GAUZE/BANDAGES/DRESSINGS) ×2 IMPLANT
DURAPREP 26ML APPLICATOR (WOUND CARE) ×2 IMPLANT
ELECT REM PT RETURN 9FT ADLT (ELECTROSURGICAL) ×2
ELECTRODE REM PT RTRN 9FT ADLT (ELECTROSURGICAL) ×1 IMPLANT
GAUZE 4X4 16PLY ~~LOC~~+RFID DBL (SPONGE) ×2 IMPLANT
GAUZE SPONGE 4X4 12PLY STRL (GAUZE/BANDAGES/DRESSINGS) ×2 IMPLANT
GLOVE EXAM NITRILE XL STR (GLOVE) IMPLANT
GLOVE SURG ENC MOIS LTX SZ6.5 (GLOVE) ×2 IMPLANT
GLOVE SURG LTX SZ9 (GLOVE) ×2 IMPLANT
GLOVE SURG UNDER POLY LF SZ6.5 (GLOVE) ×2 IMPLANT
GOWN STRL REUS W/ TWL LRG LVL3 (GOWN DISPOSABLE) IMPLANT
GOWN STRL REUS W/ TWL XL LVL3 (GOWN DISPOSABLE) ×3 IMPLANT
GOWN STRL REUS W/TWL 2XL LVL3 (GOWN DISPOSABLE) IMPLANT
GOWN STRL REUS W/TWL LRG LVL3 (GOWN DISPOSABLE)
GOWN STRL REUS W/TWL XL LVL3 (GOWN DISPOSABLE) ×6
KIT BASIN OR (CUSTOM PROCEDURE TRAY) ×2 IMPLANT
KIT TURNOVER KIT B (KITS) ×2 IMPLANT
NEEDLE HYPO 22GX1.5 SAFETY (NEEDLE) ×2 IMPLANT
NEEDLE SPNL 22GX3.5 QUINCKE BK (NEEDLE) IMPLANT
NS IRRIG 1000ML POUR BTL (IV SOLUTION) ×2 IMPLANT
OIL CARTRIDGE MAESTRO DRILL (MISCELLANEOUS) ×2
PACK LAMINECTOMY NEURO (CUSTOM PROCEDURE TRAY) ×2 IMPLANT
PAD ARMBOARD 7.5X6 YLW CONV (MISCELLANEOUS) ×4 IMPLANT
SPONGE SURGIFOAM ABS GEL SZ50 (HEMOSTASIS) ×2 IMPLANT
STRIP CLOSURE SKIN 1/2X4 (GAUZE/BANDAGES/DRESSINGS) ×2 IMPLANT
SUT VIC AB 2-0 CT1 18 (SUTURE) ×2 IMPLANT
SUT VIC AB 3-0 SH 8-18 (SUTURE) ×2 IMPLANT
TOWEL GREEN STERILE (TOWEL DISPOSABLE) ×2 IMPLANT
TOWEL GREEN STERILE FF (TOWEL DISPOSABLE) ×2 IMPLANT
WATER STERILE IRR 1000ML POUR (IV SOLUTION) ×2 IMPLANT

## 2021-03-11 NOTE — Discharge Summary (Signed)
Physician Discharge Summary  Patient ID: DAMYEN KNOLL MRN: [id number] DOB/AGE: [birth year] 69 y.o.  Admit date: 03/11/2021 Discharge date: 03/11/2021  Admission Diagnoses:  Discharge Diagnoses:  Active Problems:   Lumbar radiculopathy   Discharged Condition: good  Hospital Course: Patient admitted to the hospital where he underwent uncomplicated right-sided J1-2 microdiscectomy.  Postoperatively doing very well.  Preoperative back and radicular pain completely resolved.  Standing ambulating and voiding without difficulty.  Ready for discharge home.  Consults:   Significant Diagnostic Studies:   Treatments:   Discharge Exam: Blood pressure (!) 154/83, pulse 70, temperature 97.7 F (36.5 C), temperature source Oral, resp. rate 20, height 6' 1"  (1.854 m), weight 84.8 kg, SpO2 95 %. Awake and alert.  Oriented and appropriate.  Motor and sensory function intact.  Wound clean and dry.  Chest and abdomen benign.   Disposition: Discharge disposition: 01-Home or Self Care        Allergies as of 03/11/2021       Reactions   Lisinopril Swelling   Throat swelling        Medication List     TAKE these medications    amLODipine 10 MG tablet Commonly known as: NORVASC Take 1 tablet (10 mg total) by mouth daily.   cyclobenzaprine 10 MG tablet Commonly known as: FLEXERIL Take 1 tablet (10 mg total) by mouth 3 (three) times daily as needed for muscle spasms.   diclofenac Sodium 1 % Gel Commonly known as: Voltaren Apply 2 g topically 3 (three) times daily as needed (pain).   Eliquis 5 MG Tabs tablet Generic drug: apixaban TAKE 1 TABLET BY MOUTH TWICE DAILY.   fluticasone 50 MCG/ACT nasal spray Commonly known as: FLONASE Place 1 spray into both nostrils daily as needed for allergies or rhinitis.   gabapentin 100 MG capsule Commonly known as: NEURONTIN TAKE 1 CAPSULE (100 MG TOTAL) BY MOUTH 3 (THREE) TIMES DAILY. NEEDED FOR NUMBNESS   glimepiride 1 MG  tablet Commonly known as: AMARYL Take 1 tablet (1 mg total) by mouth daily with breakfast.   GlucoCom Lancets Misc 100 Units by Does not apply route once.   glucose blood test strip Commonly known as: True Metrix Blood Glucose Test Use as instructed   HYDROcodone-acetaminophen 5-325 MG tablet Commonly known as: NORCO/VICODIN Take 1 tablet by mouth every 4 (four) hours as needed for moderate pain ((score 4 to 6)).   hydrOXYzine 25 MG tablet Commonly known as: ATARAX/VISTARIL TAKE 1 TABLET (25 MG TOTAL) BY MOUTH 2 (TWO) TIMES DAILY AS NEEDED.   multivitamin with minerals Tabs tablet Take 1 tablet by mouth daily.   True Metrix Meter w/Device Kit Use as directed   Vitamin B Complex Tabs Take 1 tablet by mouth daily.         Signed: Cooper Render Keondria Siever 03/11/2021, 2:47 PM

## 2021-03-11 NOTE — Plan of Care (Signed)
Adequately Ready for discharge

## 2021-03-11 NOTE — Evaluation (Signed)
Physical Therapy Evaluation Patient Details Name: FINNIAN HUSTED MRN: [id number] DOB: 1950-08-08 Today's Date: 03/11/2021  History of Present Illness  Pt is 70 yo male s/p R L4-5 extraforaminal microdiscectomy on 03/11/21.  Pt with hx including afib, DM, HTN, prostate CA, stroke.  Pt reports hormone treatment for prostate CA that make him sweat.  Clinical Impression   Pt admitted with above diagnosis.  At arrival, pt already ambulating in hallway independently.  Pt has DME (well prepared) and support at home.   He was educated on and provided teach back of back precautions.  Pt ambulated 300' safely and with good adherence to back precautions with transfers     Recommendations for follow up therapy are one component of a multi-disciplinary discharge planning process, led by the attending physician.  Recommendations may be updated based on patient status, additional functional criteria and insurance authorization.  Follow Up Recommendations No PT follow up    Equipment Recommendations  None recommended by PT    Recommendations for Other Services       Precautions / Restrictions Precautions Precautions: Back Precaution Booklet Issued: Yes (comment) Precaution Comments: Provided back handout and pt provided teach back Restrictions Weight Bearing Restrictions: No      Mobility  Bed Mobility Overal bed mobility: Needs Assistance Bed Mobility: Supine to Sit;Sit to Supine     Supine to sit: Supervision Sit to supine: Supervision   General bed mobility comments: At arrival pt up and ambulating in hall independently.  Returned to room at end of session and pt educated on log roll technique performing with supervision    Transfers Overall transfer level: Needs assistance Equipment used: None Transfers: Sit to/from Stand Sit to Stand: Supervision;Independent         General transfer comment: Had supervision but at independent level.  Stood multiple times from elevated bed  and lower chair.  Ambulation/Gait Ambulation/Gait assistance: Supervision Gait Distance (Feet): 300 Feet Assistive device: IV Pole;None Gait Pattern/deviations: Step-through pattern;Decreased stride length Gait velocity: decreased   General Gait Details: Decreased stride length but with good foot clearance.  Pt able to push IV pole or ambulate without.  States he feels more supported with IV pole - discussed use of RW or cane at home if needed. Steady gait without LOB  Stairs            Wheelchair Mobility    Modified Rankin (Stroke Patients Only)       Balance Overall balance assessment: Independent Sitting-balance support: No upper extremity supported Sitting balance-Leahy Scale: Normal     Standing balance support: No upper extremity supported Standing balance-Leahy Scale: Good                               Pertinent Vitals/Pain Pain Assessment: 0-10 Pain Score: 3  Pain Location: back (reports R leg feels tremendously better) Pain Descriptors / Indicators: Discomfort Pain Intervention(s): Limited activity within patient's tolerance;Monitored during session    Home Living Family/patient expects to be discharged to:: Private residence Living Arrangements: Alone (but will be staying with his ex-wife) Available Help at Discharge: Family;Available 24 hours/day Type of Home: House Home Access: Stairs to enter Entrance Stairs-Rails: None Entrance Stairs-Number of Steps: 1+1 Home Layout: One level Home Equipment: Cane - single point;Walker - 2 wheels;Grab bars - toilet;Grab bars - tub/shower;Bedside commode;Shower seat Additional Comments: Reports "I'm very prepared"    Prior Function Level of Independence: Needs assistance   Gait /  Transfers Assistance Needed: Ambulating with AD due to pain  ADL's / Homemaking Assistance Needed: Difficulty with ADLs due to pain but typically was independent        Hand Dominance        Extremity/Trunk  Assessment   Upper Extremity Assessment Upper Extremity Assessment: Overall WFL for tasks assessed    Lower Extremity Assessment Lower Extremity Assessment: Overall WFL for tasks assessed    Cervical / Trunk Assessment Cervical / Trunk Assessment: Normal  Communication   Communication: No difficulties  Cognition Arousal/Alertness: Awake/alert Behavior During Therapy: WFL for tasks assessed/performed Overall Cognitive Status: Within Functional Limits for tasks assessed                                        General Comments General comments (skin integrity, edema, etc.): Pt educated on PT role and POC.  Educated on back precautions and back precautions with transfers and ADLS. Pt providing teachback of precautions.  At arrival pt was at nursing station ambulating independently - noted pt diaphoretic.  Asked pt if he was lightheaded and he explained that he is on hormone medication for prostate CA that makes him sweat - he was not lightheaded.    Exercises     Assessment/Plan    PT Assessment Patent does not need any further PT services  PT Problem List         PT Treatment Interventions      PT Goals (Current goals can be found in the Care Plan section)  Acute Rehab PT Goals Patient Stated Goal: home tonight PT Goal Formulation: All assessment and education complete, DC therapy    Frequency     Barriers to discharge        Co-evaluation               AM-PAC PT "6 Clicks" Mobility  Outcome Measure Help needed turning from your back to your side while in a flat bed without using bedrails?: None Help needed moving from lying on your back to sitting on the side of a flat bed without using bedrails?: A Little Help needed moving to and from a bed to a chair (including a wheelchair)?: None Help needed standing up from a chair using your arms (e.g., wheelchair or bedside chair)?: None Help needed to walk in hospital room?: None Help needed climbing  3-5 steps with a railing? : A Little 6 Click Score: 22    End of Session   Activity Tolerance: Patient tolerated treatment well Patient left: in chair;with call bell/phone within reach;with family/visitor present Nurse Communication: Mobility status      Time: 1438-1500 PT Time Calculation (min) (ACUTE ONLY): 22 min   Charges:   PT Evaluation $PT Eval Low Complexity: 1 Low          Emy Angevine, PT Acute Rehab Services Pager [phone] [facility] [phone]   Karlton Lemon 03/11/2021, 3:18 PM

## 2021-03-11 NOTE — H&P (Signed)
[name] is an 70 y.o. male.   Chief Complaint: Right lower extremity pain HPI: 70 year old male with lower back pain radiating to his right groin right anterior thigh and right anterior medial leg consistent with a right-sided L4 radiculopathy which is failed conservative management.  Work-up demonstrates evidence of a foraminal/extraforaminal disc herniation on the right at L4-5 with marked compression of his right L4 nerve root.  Patient presents now for extraforaminal microdiscectomy in hopes of improving his symptoms.  Past Medical History:  Diagnosis Date   Abscess of anal and rectal regions 07/15/2007   Qualifier: Diagnosis of  By: Amil Amen MD, Elizabeth     Atrial fibrillation [facility])    Diabetes mellitus without complication [facility]) Dx 7824   FISTULA, ANAL 01/27/2007   Qualifier: Diagnosis of  By: Amil Amen MD, Elizabeth     Hypertension Dx 2008   Prostate cancer [facility])    Stroke [facility])     Past Surgical History:  Procedure Laterality Date   CATARACT EXTRACTION  06/2017   CYSTOSCOPY  05/2017   INTERCOSTAL NERVE BLOCK Left 05/13/2020   Procedure: INTERCOSTAL NERVE BLOCK;  Surgeon: Lajuana Matte, MD;  Location: [facility] OR;  Service: Thoracic;  Laterality: Left;   PROSTATE BIOPSY      Family History  Problem Relation Age of Onset   Diabetes Mother    Breast cancer Mother    Diabetes Father    Breast cancer Sister    Prostate cancer Neg Hx    Colon cancer Neg Hx    Pancreatic cancer Neg Hx    Social History:  reports that he has been smoking cigarettes. He has a 2.50 pack-year smoking history. He has never used smokeless tobacco. He reports that he does not drink alcohol and does not use drugs.  Allergies:  Allergies  Allergen Reactions   Lisinopril Swelling    Throat swelling    Medications Prior to Admission  Medication Sig Dispense Refill   amLODipine (NORVASC) 10 MG tablet Take 1 tablet (10 mg total) by mouth daily. 90 tablet 1   apixaban (ELIQUIS) 5 MG  TABS tablet TAKE 1 TABLET BY MOUTH TWICE DAILY. 60 tablet 2   B Complex Vitamins (VITAMIN B COMPLEX) TABS Take 1 tablet by mouth daily.     gabapentin (NEURONTIN) 100 MG capsule TAKE 1 CAPSULE (100 MG TOTAL) BY MOUTH 3 (THREE) TIMES DAILY. NEEDED FOR NUMBNESS 90 capsule 0   Multiple Vitamin (MULTIVITAMIN WITH MINERALS) TABS tablet Take 1 tablet by mouth daily.     Blood Glucose Monitoring Suppl (TRUE METRIX METER) w/Device KIT Use as directed 1 kit 0   diclofenac Sodium (VOLTAREN) 1 % GEL Apply 2 g topically 3 (three) times daily as needed (pain). 100 g 1   fluticasone (FLONASE) 50 MCG/ACT nasal spray Place 1 spray into both nostrils daily as needed for allergies or rhinitis.     glimepiride (AMARYL) 1 MG tablet Take 1 tablet (1 mg total) by mouth daily with breakfast. 30 tablet 3   GlucoCom Lancets MISC 100 Units by Does not apply route once. 100 each 0   glucose blood (TRUE METRIX BLOOD GLUCOSE TEST) test strip Use as instructed 100 each 12   hydrOXYzine (ATARAX/VISTARIL) 25 MG tablet TAKE 1 TABLET (25 MG TOTAL) BY MOUTH 2 (TWO) TIMES DAILY AS NEEDED. 30 tablet 2    No results found for this or any previous visit (from the past 48 hour(s)). No results found.  Pertinent items noted in HPI and remainder  of comprehensive ROS otherwise negative.  Blood pressure (!) 165/87, pulse 72, temperature (!) 97.5 F (36.4 C), temperature source Oral, resp. rate 18, height 6' 1"  (1.854 m), weight 84.8 kg, SpO2 98 %.  Patient is awake and alert.  He is oriented and appropriate.  Speech is fluent.  Judgment insight are intact.  Cranial nerve function normal bilateral.  Motor examination reveals weakness of his right quadriceps muscle group grading out at 4/5.  Sensory examination with decrease sensation pinprick light touch in his right L4 dermatome.  Deep tendon versus normal active scepter his right patellar reflexes diminished and his Achilles reflexes are absent bilaterally.  No evidence of long track  signs.  Gait is antalgic.  Posture is mildly flexed peer examination head ears eyes nose throat is unremarked.  Chest and abdomen are benign.  Extremities are free from injury or deformity. Assessment/Plan Right L4-5 foraminal/extraforaminal disc herniation with ongoing radiculopathy.  Plan right L4-5 extraforaminal microdiscectomy.  Risks and benefits of been explained.  Patient wishes to proceed.  Mallie Mussel A Janazia Schreier 03/11/2021, 9:35 AM

## 2021-03-11 NOTE — Progress Notes (Signed)
OT Cancellation Note  Patient Details Name: [name] MRN: [id number] DOB: 1951-02-20   Cancelled Treatment:    Reason Eval/Treat Not Completed: OT screened, no needs identified, will sign off (Pt dressed and ready to d/c home; wife assisted with ADLs. Per PT note, pt has grea support at home. No OT needs identified at this time.)  [name] 03/11/2021, 4:32 PM

## 2021-03-11 NOTE — Anesthesia Postprocedure Evaluation (Signed)
Anesthesia Post Note  Patient: Josiah C Hallisey  Procedure(s) Performed: Microdiscectomy - right - Lumbar four-Lumbar five extraforaminal (Right: Back)     Patient location during evaluation: PACU Anesthesia Type: General Level of consciousness: awake Pain management: pain level controlled Vital Signs Assessment: post-procedure vital signs reviewed and stable Respiratory status: spontaneous breathing, nonlabored ventilation, respiratory function stable and patient connected to nasal cannula oxygen Cardiovascular status: blood pressure returned to baseline and stable Postop Assessment: no apparent nausea or vomiting Anesthetic complications: no   No notable events documented.  Last Vitals:  Vitals:   03/11/21 1215 03/11/21 1405  BP:  (!) 154/83  Pulse:  70  Resp:  20  Temp: 36.5 C 36.5 C  SpO2:  95%    Last Pain:  Vitals:   03/11/21 1645  TempSrc:   PainSc: 3                  Candela Krul P Karman [place]

## 2021-03-11 NOTE — Discharge Instructions (Addendum)
Wound Care Keep incision covered and dry for two days.  If you shower, cover incision with plastic wrap.  Do not put any creams, lotions, or ointments on incision. Leave steri-strips on back.  They will fall off by themselves.  Activity Walk each and every day, increasing distance each day. No lifting greater than 5 lbs.  Avoid excessive neck motion. No driving for 2 weeks; may ride as a passenger locally.  Diet Resume your normal diet.   Call Your Doctor If Any of These Occur Redness, drainage, or swelling at the wound.  Temperature greater than 101 degrees. Severe pain not relieved by pain medication. Incision starts to come apart.  Follow Up Appt Call today for appointment in 1-2 weeks ([phone]) or for problems.  If you have any hardware placed in your spine, you will need an x-ray before your appointment.   Resume Eliquis on Saturday

## 2021-03-11 NOTE — Op Note (Signed)
Date of procedure: 03/11/2021  Date of dictation: Same  Service: Neurosurgery  Preoperative diagnosis: Right L4-5 extraforaminal herniated nucleus pulposus with radiculopathy  Postoperative diagnosis: Same  Procedure Name: Right L4-5 extraforaminal microdiscectomy  Surgeon:Merci Walthers A.Viola Kinnick, M.D.  Asst. Surgeon: Milas Gain, NP  Anesthesia: General  Indication: 70 year old male with severe right lower extremity pain paresthesias and weakness consistent with a right-sided L4 radiculopathy which failed conservative management work-up demonstrates evidence of a right-sided foraminal/extraforaminal disc herniation at L4-5 with marked compression of his right L4 nerve root.  Patient presents now for extraforaminal microdiscectomy in hopes of improving his symptoms.  Operative note: After induction anesthesia, patient edition prone on the Wilson frame appropriate padded.  Lumbar region prepped and draped sterilely.  Incision made overlying L4-5.  Dissection performed on the right.  Retractor placed.  X-ray taken.  Level confirmed.  Extraforaminal approach was then performed using high-speed drill and Kerrison rongeurs to remove the superior aspect of the superior articulating process of L5 and the lateral aspect of the pars interarticularis.  Intertransverse ligament was elevated and resected.  Microscope brought in field used micro section spinal canal.  Right L4 nerve root was identified as was the disc space.  Disc herniation was dissected free and removed in multiple large fragments.  The disc herniation was further removed as it tracked medially along the course of the right L4 nerve root.  All elements of the disc herniation were resected.  Is no evidence of any residual compression.  The disc space itself was not entered.  Wound was then irrigated.  Gelfoam was placed topically.  Wound is then closed in layers with Vicryl sutures.  Steri-Strips and sterile dressing were applied.  No apparent  complications.  Patient tolerated the procedure well and he returns to the recovery room postop.

## 2021-03-11 NOTE — Anesthesia Procedure Notes (Signed)
Procedure Name: Intubation Date/Time: 03/11/2021 10:55 AM Performed by: Myna Bright, CRNA Pre-anesthesia Checklist: Patient identified, Emergency Drugs available, Suction available and Patient being monitored Patient Re-evaluated:Patient Re-evaluated prior to induction Oxygen Delivery Method: Circle system utilized Preoxygenation: Pre-oxygenation with 100% oxygen Induction Type: IV induction Ventilation: Mask ventilation without difficulty Laryngoscope Size: Mac and 4 Grade View: Grade I Tube type: Oral Tube size: 7.5 mm Number of attempts: 1 Airway Equipment and Method: Stylet Placement Confirmation: ETT inserted through vocal cords under direct vision, positive ETCO2 and breath sounds checked- equal and bilateral Secured at: 22 cm Tube secured with: Tape Dental Injury: Teeth and Oropharynx as per pre-operative assessment

## 2021-03-11 NOTE — Transfer of Care (Signed)
Immediate Anesthesia Transfer of Care Note  Patient: [name]  Procedure(s) Performed: Microdiscectomy - right - Lumbar four-Lumbar five extraforaminal (Right: Back)  Patient Location: PACU  Anesthesia Type:General  Level of Consciousness: awake, alert , oriented and patient cooperative  Airway & Oxygen Therapy: Patient Spontanous Breathing and Patient connected to face mask oxygen  Post-op Assessment: Report given to RN, Post -op Vital signs reviewed and stable and Patient moving all extremities  Post vital signs: Reviewed and stable  Last Vitals:  Vitals Value Taken Time  BP 168/105 03/11/21 1216  Temp    Pulse 87 03/11/21 1216  Resp 15 03/11/21 1216  SpO2 100 % 03/11/21 1216  Vitals shown include unvalidated device data.  Last Pain:  Vitals:   03/11/21 1031  TempSrc:   PainSc: 10-Worst pain ever      Patients Stated Pain Goal: 3 (61/60/73 7106)  Complications: No notable events documented.

## 2021-03-11 NOTE — Progress Notes (Signed)
Patient is discharged from room 3C06 at this time. Alert and in stable condition. IV site d/c'd and instructions read to patient with understanding verbalized and all questions answered. Left unit via wheelchair with all belongings at side.  

## 2021-03-11 NOTE — Brief Op Note (Signed)
03/11/2021  12:00 PM  PATIENT:  [name]  70 y.o. male  PRE-OPERATIVE DIAGNOSIS:  Lumbar radiculopathy  POST-OPERATIVE DIAGNOSIS:  Lumbar radiculopathy  PROCEDURE:  Procedure(s): Microdiscectomy - right - Lumbar four-Lumbar five extraforaminal (Right)  SURGEON:  Surgeon(s) and Role:    * Earnie Larsson, MD - Primary    Kristeen Miss, MD - Assisting  PHYSICIAN ASSISTANT:   ASSISTANTSMearl Latin   ANESTHESIA:   general  EBL:  30 mL   BLOOD ADMINISTERED:none  DRAINS: none   LOCAL MEDICATIONS USED:  MARCAINE     SPECIMEN:  No Specimen  DISPOSITION OF SPECIMEN:  N/A  COUNTS:  YES  TOURNIQUET:  * No tourniquets in log *  DICTATION: .Dragon Dictation  PLAN OF CARE: Admit for overnight observation  PATIENT DISPOSITION:  PACU - hemodynamically stable.   Delay start of Pharmacological VTE agent (>24hrs) due to surgical blood loss or risk of bleeding: yes

## 2021-03-12 ENCOUNTER — Encounter (HOSPITAL_COMMUNITY): Payer: Self-pay | Admitting: Neurosurgery

## 2021-03-12 ENCOUNTER — Other Ambulatory Visit: Payer: Self-pay

## 2021-03-12 ENCOUNTER — Telehealth: Payer: Self-pay

## 2021-03-12 ENCOUNTER — Emergency Department (HOSPITAL_COMMUNITY): Payer: Medicare HMO

## 2021-03-12 ENCOUNTER — Emergency Department (HOSPITAL_BASED_OUTPATIENT_CLINIC_OR_DEPARTMENT_OTHER): Payer: Medicare HMO

## 2021-03-12 DIAGNOSIS — K5900 Constipation, unspecified: Secondary | ICD-10-CM | POA: Diagnosis not present

## 2021-03-12 DIAGNOSIS — Z5321 Procedure and treatment not carried out due to patient leaving prior to being seen by health care provider: Secondary | ICD-10-CM | POA: Diagnosis not present

## 2021-03-12 DIAGNOSIS — M47816 Spondylosis without myelopathy or radiculopathy, lumbar region: Secondary | ICD-10-CM | POA: Diagnosis not present

## 2021-03-12 DIAGNOSIS — K5730 Diverticulosis of large intestine without perforation or abscess without bleeding: Secondary | ICD-10-CM | POA: Diagnosis not present

## 2021-03-12 DIAGNOSIS — K449 Diaphragmatic hernia without obstruction or gangrene: Secondary | ICD-10-CM | POA: Diagnosis not present

## 2021-03-12 NOTE — ED Triage Notes (Signed)
Patient here POV from Home with Constipation.  Patient has was constipated prior to Back Surgery and since continuing Prescription Narcotic Pain Medicine the Patient has had worsening Constipation. Last Regular BM was 4-5 days PTA.  NAD Noted during Triage. No Urinary Problems. BIB Wheelchair. A&Ox4. GCS 15.

## 2021-03-12 NOTE — Telephone Encounter (Signed)
Contacted pt to go over provider response pt didn't answer was unable to lvm due to vm being full

## 2021-03-12 NOTE — ED Notes (Signed)
This RN attempted to Draw Lab Specimens Once with No Success. Patient refused further Blood Specimen Studies.

## 2021-03-12 NOTE — Telephone Encounter (Signed)
Transition Care Management Follow-up Telephone Call Date of discharge and from where: 03/11/2021, [facility]  How have you been since you were released from the hospital? He said he is doing well, paying attention to his safety precautions.  Any questions or concerns? Yes- he has been trying to visualize his surgical site to " see what has been done."   He noted that he is very glad that Dr [name] changed his metformin to glimepiride.    Items Reviewed: Did the pt receive and understand the discharge instructions provided? Yes  Medications obtained and verified? Yes  - he said he has all of his medications at this time. He requested an appointment with Benard Halsted, RPH to discuss his medications.  Appointment scheduled  03/19/2021, he is requesting a televisit.  Other? No  Any new allergies since your discharge? No  Do you have support at home?  He usually lives alone but his ex-wife is providing some assistance after surgery.   Home Care and Equipment/Supplies: Were home health services ordered? no If so, what is the name of the agency? N/a  Has the agency set up a time to come to the patient's home? not applicable Were any new equipment or medical supplies ordered?  No What is the name of the medical supply agency? N/a Were you able to get the supplies/equipment? not applicable Do you have any questions related to the use of the equipment or supplies? No  Has glucometer, he said his blood sugars have been in the low 100's   Functional Questionnaire: (I = Independent and D = Dependent) ADLs: independent. He said he has walkers and canes if needed. He also has a raised toilet seat.    Follow up appointments reviewed:  PCP Hospital f/u appt confirmed?  He will call to schedule an appointment. He wanted to meet with Benard Halsted, RPH first to reconcile his medications.    [facility] f/u appt confirmed? Yes  Scheduled to see neurosurgeon on 03/20/2021. Are  transportation arrangements needed? No  If their condition worsens, is the pt aware to call PCP or go to the Emergency Dept.? Yes Was the patient provided with contact information for the PCP's office or ED? Yes Was to pt encouraged to call back with questions or concerns? Yes

## 2021-03-13 ENCOUNTER — Emergency Department (HOSPITAL_BASED_OUTPATIENT_CLINIC_OR_DEPARTMENT_OTHER)
Admission: EM | Admit: 2021-03-13 | Discharge: 2021-03-13 | Disposition: A | Discharge status: Home / Self Care | Payer: Medicare HMO | Attending: Emergency Medicine | Admitting: Emergency Medicine

## 2021-03-13 ENCOUNTER — Other Ambulatory Visit: Payer: Self-pay

## 2021-03-13 ENCOUNTER — Other Ambulatory Visit: Payer: Self-pay | Admitting: Internal Medicine

## 2021-03-13 LAB — CBC WITH DIFFERENTIAL/PLATELET
Abs Immature Granulocytes: 0.02 10*3/uL (ref 0.00–0.07)
Basophils Absolute: 0 10*3/uL (ref 0.0–0.1)
Basophils Relative: 0 %
Eosinophils Absolute: 0 10*3/uL (ref 0.0–0.5)
Eosinophils Relative: 0 %
HCT: 37 % — ABNORMAL LOW (ref 39.0–52.0)
Hemoglobin: 12.6 g/dL — ABNORMAL LOW (ref 13.0–17.0)
Immature Granulocytes: 0 %
Lymphocytes Relative: 28 %
Lymphs Abs: 2 10*3/uL (ref 0.7–4.0)
MCH: 30.3 pg (ref 26.0–34.0)
MCHC: 34.1 g/dL (ref 30.0–36.0)
MCV: 88.9 fL (ref 80.0–100.0)
Monocytes Absolute: 0.4 10*3/uL (ref 0.1–1.0)
Monocytes Relative: 6 %
Neutro Abs: 4.9 10*3/uL (ref 1.7–7.7)
Neutrophils Relative %: 66 %
Platelets: 238 10*3/uL (ref 150–400)
RBC: 4.16 MIL/uL — ABNORMAL LOW (ref 4.22–5.81)
RDW: 13.4 % (ref 11.5–15.5)
WBC: 7.4 10*3/uL (ref 4.0–10.5)
nRBC: 0 % (ref 0.0–0.2)

## 2021-03-13 LAB — BASIC METABOLIC PANEL
Anion gap: 7 (ref 5–15)
BUN: 22 mg/dL (ref 8–23)
CO2: 27 mmol/L (ref 22–32)
Calcium: 9.3 mg/dL (ref 8.9–10.3)
Chloride: 104 mmol/L (ref 98–111)
Creatinine, Ser: 0.96 mg/dL (ref 0.61–1.24)
GFR, Estimated: >60 mL/min (ref >60)
Glucose, Bld: 107 mg/dL — ABNORMAL HIGH (ref 70–99)
Potassium: 3.8 mmol/L (ref 3.5–5.1)
Sodium: 138 mmol/L (ref 135–145)

## 2021-03-13 MED ORDER — SENNA-DOCUSATE SODIUM 8.6-50 MG PO TABS
1.0000 | ORAL_TABLET | Freq: Every day | ORAL | 0 refills | Status: DC
  Filled 2021-03-13: qty 15, 15d supply, fill #0

## 2021-03-13 MED ORDER — SENNA-DOCUSATE SODIUM 8.6-50 MG PO TABS: 1.0000 | ORAL_TABLET | Freq: Every day | ORAL | 0 refills | Status: DC

## 2021-03-13 NOTE — ED Notes (Signed)
This RN was informed by Registration that this Patient had a Large BM in the Waiting Area Bathroom and decided to leave Department. Patient will be appropriately discharged.

## 2021-03-14 ENCOUNTER — Other Ambulatory Visit: Payer: Self-pay

## 2021-03-19 ENCOUNTER — Ambulatory Visit: Payer: Medicare HMO | Admitting: Pharmacist

## 2021-03-20 DIAGNOSIS — Z9889 Other specified postprocedural states: Secondary | ICD-10-CM | POA: Diagnosis not present

## 2021-03-20 DIAGNOSIS — I10 Essential (primary) hypertension: Secondary | ICD-10-CM | POA: Diagnosis not present

## 2021-03-21 ENCOUNTER — Ambulatory Visit: Payer: Medicare HMO | Admitting: Cardiovascular Disease

## 2021-03-24 ENCOUNTER — Telehealth: Payer: Self-pay

## 2021-03-24 NOTE — Telephone Encounter (Signed)
Copied from [place] [id number]. Topic: General - Other >> Mar 05, 2021  3:29 PM Yvette Rack wrote: Reason for CRM: Pt stated he is scheduled to have back surgery next Tuesday but there is a hold up due to scheduler unable to locate an appt with a cardiologist until early October. Pt stated he is willing to sign a waiver because he needs to have these things completed so he can move forward with his treatment for cancer. Cb# [phone]

## 2021-03-24 NOTE — Telephone Encounter (Signed)
Will forward to provider  

## 2021-03-25 ENCOUNTER — Encounter: Payer: Self-pay | Admitting: Pharmacist

## 2021-03-25 ENCOUNTER — Ambulatory Visit: Payer: Medicare HMO | Attending: Internal Medicine | Admitting: Pharmacist

## 2021-03-25 ENCOUNTER — Other Ambulatory Visit: Payer: Self-pay

## 2021-03-25 DIAGNOSIS — Z79899 Other long term (current) drug therapy: Secondary | ICD-10-CM

## 2021-03-25 MED ORDER — METFORMIN HCL 500 MG PO TABS
500.0000 mg | ORAL_TABLET | Freq: Every day | ORAL | 1 refills | Status: DC
  Filled 2021-03-25: qty 90, 90d supply, fill #0

## 2021-03-25 NOTE — Progress Notes (Signed)
    S:     No chief complaint on file.  Patient arrives in good spirits. He presents for DM medication reconciliation. He has some questions regarding recent medication changes. He was not referred to me but expressed that he had questions for a pharmacist.   Today, he brings me his medications to verify if he should be taking them. He has the following with him: - amlodipine 10 mg daily  - cyclobenzaprine 10 mg TID - Eliquis 5 mg BID - Gabapentin 100 mg TID - Hydrocodone/APAP 5/325 mg q4h - Metformin 500 mg BID.   He tells me he takes all the above with the exception of metformin. This was stopped and glimepiride started due to GI side effects with the metformin. He does not have the glimepiride but tells me he left it at home and he is taking it. Of note, he does not had his Voltaren, Flonase, hydroxyzine, MVI, or Senokot S with him but he assures me he is taking these.   Family/Social History:  -Fhx: DM -Tobacco: current 0.25 PPD smoker  -Alcohol: denies use   Insurance coverage/medication affordability: [employer]  Medication adherence reported.   Current diabetes medications include: glimepiride 1 mg daily   Patient denies hypoglycemic events.  Patient reported dietary habits: none reported. We spent the entire visit discussing medication appropriateness.   Patient-reported exercise habits: none reported. We spent the entire visit discussing medication appropriateness.     O:  Physical Exam  ROS  Lab Results  Component Value Date   HGBA1C 7.6 (H) 03/03/2021   There were no vitals filed for this visit.  Lipid Panel     Component Value Date/Time   CHOL 189 01/29/2020 1022   TRIG 123 01/29/2020 1022   HDL 58 01/29/2020 1022   CHOLHDL 3.3 01/29/2020 1022   CHOLHDL 4.2 05/02/2014 0000   VLDL 72 (H) 05/02/2014 0000   LDLCALC 109 (H) 01/29/2020 1022    Home fasting blood sugars: none reported   2 hour post-meal/random blood sugars: none  reported  Clinical Atherosclerotic Cardiovascular Disease (ASCVD): No  The 10-year ASCVD risk score (Arnett DK, et al., 2019) is: 45.7%   Values used to calculate the score:     Age: 70 years     Sex: Male     Is Non-Hispanic African American: Yes     Diabetic: Yes     Tobacco smoker: Yes     Systolic Blood Pressure: 300 mmHg     Is BP treated: Yes     HDL Cholesterol: 58 mg/dL     Total Cholesterol: 189 mg/dL   A/P: Diabetes longstanding currently above goal. Patient is able to verbalize appropriate hypoglycemia management plan. Medication adherence appears appropriate. -Continued current regimen.  -Med rec performed. Outdated rxs removed. No changes made to his medications.  -Assured pt the glimepiride is replacing metformin. He verbalized understanding of this.  -Extensively discussed pathophysiology of diabetes, recommended lifestyle interventions, dietary effects on blood sugar control -Counseled on s/sx of and management of hypoglycemia -Next A1C anticipated 05/2021.   Written patient instructions provided.  Total time in face to face counseling 30 minutes.   Follow up w/ PCP.    [name], PharmD, [facility] [place], [place] [phone]

## 2021-03-31 ENCOUNTER — Telehealth: Payer: Self-pay | Admitting: *Deleted

## 2021-03-31 NOTE — Telephone Encounter (Signed)
CALLED PATIENT TO REMIND OF SIM APPT. FOR 04-01-21- ARRIVAL TIME- 10:15 AM @ [facility], SPOKE WITH PATIENT AND HE IS AWARE OF THIS APPT.

## 2021-04-01 ENCOUNTER — Other Ambulatory Visit: Payer: Self-pay

## 2021-04-01 ENCOUNTER — Ambulatory Visit
Admission: RE | Admit: 2021-04-01 | Discharge: 2021-04-01 | Disposition: A | Discharge status: Home / Self Care | Payer: Medicare HMO | Source: Ambulatory Visit | Attending: Radiation Oncology | Admitting: Radiation Oncology

## 2021-04-01 DIAGNOSIS — Z510 Encounter for antineoplastic radiation therapy: Secondary | ICD-10-CM | POA: Diagnosis not present

## 2021-04-01 DIAGNOSIS — C61 Malignant neoplasm of prostate: Secondary | ICD-10-CM | POA: Insufficient documentation

## 2021-04-01 NOTE — Progress Notes (Signed)
  Radiation Oncology         [phone] ________________________________  Name: [name] MRN: [id number]  Date: 04/01/2021  DOB: 10/26/1950  SIMULATION AND TREATMENT PLANNING NOTE    ICD-10-CM   1. Malignant neoplasm of prostate ([place])  C61       DIAGNOSIS:  70 y.o. gentleman with Stage T1c adenocarcinoma of the prostate with Gleason score of 4+3, and PSA of 33.50.  NARRATIVE:  The patient was brought to the [facility].  Identity was confirmed.  All relevant records and images related to the planned course of therapy were reviewed.  The patient freely provided informed written consent to proceed with treatment after reviewing the details related to the planned course of therapy. The consent form was witnessed and verified by the simulation staff.  Then, the patient was set-up in a stable reproducible supine position for radiation therapy.  A vacuum lock pillow device was custom fabricated to position his legs in a reproducible immobilized position.  Then, I performed a urethrogram under sterile conditions to identify the prostatic bed.  CT images were obtained.  Surface markings were placed.  The CT images were loaded into the planning software.  Then the prostate bed target, pelvic lymph node target and avoidance structures including the rectum, bladder, bowel and hips were contoured.  Treatment planning then occurred.  The radiation prescription was entered and confirmed.  A total of one complex treatment devices were fabricated. I have requested : Intensity Modulated Radiotherapy (IMRT) is medically necessary for this case for the following reason:  Rectal sparing.Marland Kitchen  PLAN:  The patient will receive 45 Gy in 25 fractions of 1.8 Gy, followed by a boost to the prostate to a total dose of 75 Gy with 15 additional fractions of 2 Gy.   ________________________________  Sheral Apley Tammi Klippel, M.D.

## 2021-04-02 ENCOUNTER — Telehealth: Payer: Self-pay | Admitting: *Deleted

## 2021-04-02 NOTE — Telephone Encounter (Signed)
Copied from [place] [id number]. Topic: General - Other >> Mar 26, 2021  1:45 PM Valere Dross wrote: Reason for CRM: Pt called in stating he wanted to let Lurena Joiner know from prior conversation that he found his medication that he was trying to locate, please advise.

## 2021-04-03 NOTE — Telephone Encounter (Signed)
Thank you :)

## 2021-04-08 ENCOUNTER — Encounter: Payer: Self-pay | Admitting: Radiation Oncology

## 2021-04-08 NOTE — Progress Notes (Signed)
  Radiation Oncology         [phone] ________________________________  Name: [name] MRN: [id number]  Date: 04/08/2021  DOB: Dec 22, 1950  VIRTUAL SIMULATION NOTE  NARRATIVE:  The patient underwent simulation today for ongoing radiation therapy.  The existing CT study set was employed for the purpose of virtual treatment planning.  The target and avoidance structures were reviewed and in some cases modified.  Treatment planning then occurred.  The radiation boost prescription was entered and confirmed.   I have requested : Isodose Plan.   PLAN:  This modified radiation beam arrangement is intended to continue the current radiation dose to an additional 30 Gy in 15 fractions for a total cumulative dose of 2 Gy.  ------------------------------------------------  Sheral Apley. Tammi Klippel, M.D.

## 2021-04-09 DIAGNOSIS — Z9889 Other specified postprocedural states: Secondary | ICD-10-CM | POA: Diagnosis not present

## 2021-04-10 ENCOUNTER — Ambulatory Visit: Payer: Medicare HMO

## 2021-04-10 ENCOUNTER — Other Ambulatory Visit: Payer: Self-pay

## 2021-04-10 DIAGNOSIS — H401131 Primary open-angle glaucoma, bilateral, mild stage: Secondary | ICD-10-CM | POA: Diagnosis not present

## 2021-04-10 MED ORDER — LATANOPROST 0.005 % OP SOLN
OPHTHALMIC | 3 refills | Status: DC
  Filled 2021-04-10: qty 2.5, 20d supply, fill #0

## 2021-04-11 ENCOUNTER — Other Ambulatory Visit: Payer: Self-pay

## 2021-04-11 ENCOUNTER — Ambulatory Visit: Payer: Medicare HMO

## 2021-04-11 DIAGNOSIS — C61 Malignant neoplasm of prostate: Secondary | ICD-10-CM | POA: Diagnosis not present

## 2021-04-11 DIAGNOSIS — Z510 Encounter for antineoplastic radiation therapy: Secondary | ICD-10-CM | POA: Diagnosis not present

## 2021-04-13 ENCOUNTER — Other Ambulatory Visit: Payer: Self-pay | Admitting: Internal Medicine

## 2021-04-13 DIAGNOSIS — R200 Anesthesia of skin: Secondary | ICD-10-CM

## 2021-04-13 DIAGNOSIS — M5416 Radiculopathy, lumbar region: Secondary | ICD-10-CM

## 2021-04-14 ENCOUNTER — Other Ambulatory Visit: Payer: Self-pay

## 2021-04-14 ENCOUNTER — Ambulatory Visit: Payer: Medicare HMO

## 2021-04-14 MED ORDER — GABAPENTIN 100 MG PO CAPS
ORAL_CAPSULE | ORAL | 0 refills | Status: DC
Start: 2021-04-14 — End: 2021-11-20
  Filled 2021-04-14: qty 90, 30d supply, fill #0

## 2021-04-14 NOTE — Telephone Encounter (Signed)
Requested Prescriptions  Pending Prescriptions Disp Refills  . gabapentin (NEURONTIN) 100 MG capsule 90 capsule 0    Sig: TAKE 1 CAPSULE (100 MG TOTAL) BY MOUTH 3 (THREE) TIMES DAILY. NEEDED FOR NUMBNESS     Neurology: Anticonvulsants - gabapentin Passed - 04/13/2021  7:32 PM      Passed - Valid encounter within last 12 months    Recent Outpatient Visits          2 weeks ago Medication management   [place], RPH-CPP   1 month ago Preoperative evaluation to rule out surgical contraindication   [place] [name], MD   3 months ago Lumbar radiculopathy   [place], Deborah B, MD   5 months ago Type 2 diabetes mellitus with other neurologic complication, without long-term current use of insulin [facility])   [place] [name], MD   10 months ago Hospital discharge follow-up   [facility] [name], MD

## 2021-04-15 ENCOUNTER — Ambulatory Visit: Payer: Medicare HMO

## 2021-04-15 ENCOUNTER — Other Ambulatory Visit: Payer: Self-pay

## 2021-04-15 ENCOUNTER — Telehealth: Payer: Self-pay | Admitting: *Deleted

## 2021-04-15 ENCOUNTER — Ambulatory Visit
Admission: RE | Admit: 2021-04-15 | Discharge: 2021-04-15 | Disposition: A | Discharge status: Home / Self Care | Payer: Medicare HMO | Source: Ambulatory Visit | Attending: Radiation Oncology | Admitting: Radiation Oncology

## 2021-04-15 DIAGNOSIS — Z510 Encounter for antineoplastic radiation therapy: Secondary | ICD-10-CM | POA: Diagnosis not present

## 2021-04-15 DIAGNOSIS — C61 Malignant neoplasm of prostate: Secondary | ICD-10-CM | POA: Diagnosis not present

## 2021-04-15 DIAGNOSIS — I69354 Hemiplegia and hemiparesis following cerebral infarction affecting left non-dominant side: Secondary | ICD-10-CM

## 2021-04-15 NOTE — Telephone Encounter (Signed)
Copied from [place] [id number]. Topic: Referral - Request for Referral >> Apr 15, 2021 11:28 AM Robina Ade, Helene Kelp D wrote: Has patient seen PCP for this complaint? Yes.   *If NO, is insurance requiring patient see PCP for this issue before PCP can refer them? Referral for which specialty: neurosurgery Preferred provider/office: [facility] Reason for referral: Patient needs a new referral for the same current issue. Patient would like to see Dr. Clydene Laming.

## 2021-04-16 ENCOUNTER — Ambulatory Visit
Admission: RE | Admit: 2021-04-16 | Discharge: 2021-04-16 | Disposition: A | Discharge status: Home / Self Care | Payer: Medicare HMO | Source: Ambulatory Visit | Attending: Radiation Oncology | Admitting: Radiation Oncology

## 2021-04-16 ENCOUNTER — Other Ambulatory Visit: Payer: Self-pay

## 2021-04-16 DIAGNOSIS — C61 Malignant neoplasm of prostate: Secondary | ICD-10-CM | POA: Diagnosis not present

## 2021-04-16 DIAGNOSIS — Z510 Encounter for antineoplastic radiation therapy: Secondary | ICD-10-CM | POA: Diagnosis not present

## 2021-04-16 NOTE — Telephone Encounter (Signed)
Will forward to provider  

## 2021-04-17 ENCOUNTER — Ambulatory Visit: Payer: Medicare HMO | Admitting: Cardiovascular Disease

## 2021-04-17 ENCOUNTER — Other Ambulatory Visit: Payer: Self-pay

## 2021-04-17 ENCOUNTER — Ambulatory Visit
Admission: RE | Admit: 2021-04-17 | Discharge: 2021-04-17 | Disposition: A | Discharge status: Home / Self Care | Payer: Medicare HMO | Source: Ambulatory Visit | Attending: Radiation Oncology | Admitting: Radiation Oncology

## 2021-04-17 DIAGNOSIS — Z510 Encounter for antineoplastic radiation therapy: Secondary | ICD-10-CM | POA: Diagnosis not present

## 2021-04-17 DIAGNOSIS — C61 Malignant neoplasm of prostate: Secondary | ICD-10-CM | POA: Diagnosis not present

## 2021-04-18 ENCOUNTER — Ambulatory Visit
Admission: RE | Admit: 2021-04-18 | Discharge: 2021-04-18 | Disposition: A | Discharge status: Home / Self Care | Payer: Medicare HMO | Source: Ambulatory Visit | Attending: Radiation Oncology | Admitting: Radiation Oncology

## 2021-04-18 DIAGNOSIS — Z510 Encounter for antineoplastic radiation therapy: Secondary | ICD-10-CM | POA: Diagnosis not present

## 2021-04-18 DIAGNOSIS — C61 Malignant neoplasm of prostate: Secondary | ICD-10-CM | POA: Diagnosis not present

## 2021-04-21 ENCOUNTER — Ambulatory Visit: Payer: Medicare HMO

## 2021-04-21 ENCOUNTER — Other Ambulatory Visit: Payer: Self-pay

## 2021-04-21 ENCOUNTER — Ambulatory Visit
Admission: RE | Admit: 2021-04-21 | Discharge: 2021-04-21 | Disposition: A | Discharge status: Home / Self Care | Payer: Medicare HMO | Source: Ambulatory Visit | Attending: Radiation Oncology | Admitting: Radiation Oncology

## 2021-04-21 DIAGNOSIS — C61 Malignant neoplasm of prostate: Secondary | ICD-10-CM | POA: Diagnosis not present

## 2021-04-21 DIAGNOSIS — Z510 Encounter for antineoplastic radiation therapy: Secondary | ICD-10-CM | POA: Diagnosis not present

## 2021-04-22 ENCOUNTER — Ambulatory Visit: Payer: Medicare HMO | Attending: Internal Medicine | Admitting: Pharmacist

## 2021-04-22 ENCOUNTER — Ambulatory Visit
Admission: RE | Admit: 2021-04-22 | Discharge: 2021-04-22 | Disposition: A | Discharge status: Home / Self Care | Payer: Medicare HMO | Source: Ambulatory Visit | Attending: Radiation Oncology | Admitting: Radiation Oncology

## 2021-04-22 DIAGNOSIS — Z510 Encounter for antineoplastic radiation therapy: Secondary | ICD-10-CM | POA: Diagnosis not present

## 2021-04-22 DIAGNOSIS — Z23 Encounter for immunization: Secondary | ICD-10-CM | POA: Diagnosis not present

## 2021-04-22 DIAGNOSIS — C61 Malignant neoplasm of prostate: Secondary | ICD-10-CM | POA: Diagnosis not present

## 2021-04-22 NOTE — Progress Notes (Signed)
Patient presents for vaccination against influenza per orders of Dr. [name]. Consent given. Counseling provided. No contraindications exists. Vaccine administered without incident.   Benard Halsted, PharmD, [facility] [place], [place] [phone]

## 2021-04-23 ENCOUNTER — Other Ambulatory Visit: Payer: Self-pay

## 2021-04-23 ENCOUNTER — Ambulatory Visit
Admission: RE | Admit: 2021-04-23 | Discharge: 2021-04-23 | Disposition: A | Discharge status: Home / Self Care | Payer: Medicare HMO | Source: Ambulatory Visit | Attending: Radiation Oncology | Admitting: Radiation Oncology

## 2021-04-23 DIAGNOSIS — C61 Malignant neoplasm of prostate: Secondary | ICD-10-CM | POA: Diagnosis not present

## 2021-04-23 DIAGNOSIS — Z510 Encounter for antineoplastic radiation therapy: Secondary | ICD-10-CM | POA: Diagnosis not present

## 2021-04-24 ENCOUNTER — Ambulatory Visit
Admission: RE | Admit: 2021-04-24 | Discharge: 2021-04-24 | Disposition: A | Discharge status: Home / Self Care | Payer: Medicare HMO | Source: Ambulatory Visit | Attending: Radiation Oncology | Admitting: Radiation Oncology

## 2021-04-24 ENCOUNTER — Telehealth: Payer: Self-pay

## 2021-04-24 DIAGNOSIS — C61 Malignant neoplasm of prostate: Secondary | ICD-10-CM | POA: Diagnosis not present

## 2021-04-24 DIAGNOSIS — Z510 Encounter for antineoplastic radiation therapy: Secondary | ICD-10-CM | POA: Diagnosis not present

## 2021-04-24 NOTE — Telephone Encounter (Signed)
Returned [name] call express to him the importance of coming for treatment.  Mr. Leopard concerned that after flu shot he was having watery eyes and stuffy nose didn't need to come for appointment this afternoon.  Afebrile.  He will be in this afternoon as scheduled.  Nothing else follows.

## 2021-04-25 ENCOUNTER — Ambulatory Visit
Admission: RE | Admit: 2021-04-25 | Discharge: 2021-04-25 | Disposition: A | Discharge status: Home / Self Care | Payer: Medicare HMO | Source: Ambulatory Visit | Attending: Radiation Oncology | Admitting: Radiation Oncology

## 2021-04-25 ENCOUNTER — Other Ambulatory Visit: Payer: Self-pay

## 2021-04-25 DIAGNOSIS — C61 Malignant neoplasm of prostate: Secondary | ICD-10-CM

## 2021-04-25 DIAGNOSIS — Z510 Encounter for antineoplastic radiation therapy: Secondary | ICD-10-CM | POA: Diagnosis not present

## 2021-04-28 ENCOUNTER — Other Ambulatory Visit: Payer: Self-pay

## 2021-04-28 ENCOUNTER — Ambulatory Visit
Admission: RE | Admit: 2021-04-28 | Discharge: 2021-04-28 | Disposition: A | Discharge status: Home / Self Care | Payer: Medicare HMO | Source: Ambulatory Visit | Attending: Radiation Oncology | Admitting: Radiation Oncology

## 2021-04-28 DIAGNOSIS — Z510 Encounter for antineoplastic radiation therapy: Secondary | ICD-10-CM | POA: Diagnosis not present

## 2021-04-28 DIAGNOSIS — C61 Malignant neoplasm of prostate: Secondary | ICD-10-CM | POA: Diagnosis not present

## 2021-04-29 ENCOUNTER — Ambulatory Visit
Admission: RE | Admit: 2021-04-29 | Discharge: 2021-04-29 | Disposition: A | Discharge status: Home / Self Care | Payer: Medicare HMO | Source: Ambulatory Visit | Attending: Radiation Oncology | Admitting: Radiation Oncology

## 2021-04-29 DIAGNOSIS — Z510 Encounter for antineoplastic radiation therapy: Secondary | ICD-10-CM | POA: Diagnosis not present

## 2021-04-29 DIAGNOSIS — C61 Malignant neoplasm of prostate: Secondary | ICD-10-CM | POA: Diagnosis not present

## 2021-04-30 ENCOUNTER — Other Ambulatory Visit: Payer: Self-pay

## 2021-04-30 ENCOUNTER — Ambulatory Visit
Admission: RE | Admit: 2021-04-30 | Discharge: 2021-04-30 | Disposition: A | Discharge status: Home / Self Care | Payer: Medicare HMO | Source: Ambulatory Visit | Attending: Radiation Oncology | Admitting: Radiation Oncology

## 2021-04-30 DIAGNOSIS — C61 Malignant neoplasm of prostate: Secondary | ICD-10-CM | POA: Diagnosis not present

## 2021-04-30 DIAGNOSIS — Z510 Encounter for antineoplastic radiation therapy: Secondary | ICD-10-CM | POA: Diagnosis not present

## 2021-05-01 ENCOUNTER — Inpatient Hospital Stay: Payer: Medicare HMO | Attending: Radiation Oncology | Admitting: Dietician

## 2021-05-01 ENCOUNTER — Ambulatory Visit
Admission: RE | Admit: 2021-05-01 | Discharge: 2021-05-01 | Disposition: A | Discharge status: Home / Self Care | Payer: Medicare HMO | Source: Ambulatory Visit | Attending: Radiation Oncology | Admitting: Radiation Oncology

## 2021-05-01 DIAGNOSIS — C61 Malignant neoplasm of prostate: Secondary | ICD-10-CM | POA: Diagnosis not present

## 2021-05-01 DIAGNOSIS — Z510 Encounter for antineoplastic radiation therapy: Secondary | ICD-10-CM | POA: Diagnosis not present

## 2021-05-02 ENCOUNTER — Ambulatory Visit
Admission: RE | Admit: 2021-05-02 | Discharge: 2021-05-02 | Disposition: A | Discharge status: Home / Self Care | Payer: Medicare HMO | Source: Ambulatory Visit | Attending: Radiation Oncology | Admitting: Radiation Oncology

## 2021-05-02 ENCOUNTER — Other Ambulatory Visit: Payer: Self-pay

## 2021-05-02 DIAGNOSIS — C61 Malignant neoplasm of prostate: Secondary | ICD-10-CM | POA: Diagnosis not present

## 2021-05-02 DIAGNOSIS — Z510 Encounter for antineoplastic radiation therapy: Secondary | ICD-10-CM | POA: Diagnosis not present

## 2021-05-05 ENCOUNTER — Ambulatory Visit: Payer: Medicare HMO

## 2021-05-06 ENCOUNTER — Other Ambulatory Visit: Payer: Self-pay

## 2021-05-06 ENCOUNTER — Ambulatory Visit
Admission: RE | Admit: 2021-05-06 | Discharge: 2021-05-06 | Disposition: A | Discharge status: Home / Self Care | Payer: Medicare HMO | Source: Ambulatory Visit | Attending: Radiation Oncology | Admitting: Radiation Oncology

## 2021-05-06 ENCOUNTER — Ambulatory Visit: Payer: Medicare HMO | Admitting: Podiatry

## 2021-05-06 DIAGNOSIS — C61 Malignant neoplasm of prostate: Secondary | ICD-10-CM | POA: Diagnosis not present

## 2021-05-06 DIAGNOSIS — Z510 Encounter for antineoplastic radiation therapy: Secondary | ICD-10-CM | POA: Diagnosis not present

## 2021-05-07 ENCOUNTER — Other Ambulatory Visit: Payer: Self-pay | Admitting: Internal Medicine

## 2021-05-07 ENCOUNTER — Ambulatory Visit: Payer: Medicare HMO

## 2021-05-07 ENCOUNTER — Other Ambulatory Visit: Payer: Self-pay

## 2021-05-07 ENCOUNTER — Telehealth: Payer: Self-pay

## 2021-05-07 MED ORDER — PRAVASTATIN SODIUM 10 MG PO TABS
10.0000 mg | ORAL_TABLET | Freq: Every day | ORAL | 4 refills | Status: DC
  Filled 2021-05-07: qty 30, 30d supply, fill #0

## 2021-05-07 NOTE — Telephone Encounter (Signed)
Spoke with [name] he had to cancel radiation treatment today.  Stated " I just wasn't feeling well".  Complaints of chest cold, runny non-solid stools, increased fatigue, and poor appetite.  [name] verbalized that he was taking Imodium AD as directed and trying to drink more fluids, but just feels too weak to eat at this time.  Expressed that he will call tomorrow if still not feeling well.  He has family members in the home to look after him.  Nothing else follows.

## 2021-05-08 ENCOUNTER — Ambulatory Visit
Admission: RE | Admit: 2021-05-08 | Discharge: 2021-05-08 | Disposition: A | Discharge status: Home / Self Care | Payer: Medicare HMO | Source: Ambulatory Visit | Attending: Radiation Oncology | Admitting: Radiation Oncology

## 2021-05-08 ENCOUNTER — Telehealth: Payer: Self-pay

## 2021-05-08 ENCOUNTER — Other Ambulatory Visit: Payer: Self-pay

## 2021-05-08 DIAGNOSIS — Z510 Encounter for antineoplastic radiation therapy: Secondary | ICD-10-CM | POA: Diagnosis not present

## 2021-05-08 DIAGNOSIS — C61 Malignant neoplasm of prostate: Secondary | ICD-10-CM | POA: Diagnosis not present

## 2021-05-08 NOTE — Telephone Encounter (Signed)
Patient contacted the office concerned about "clear congestion". He states that he does not want his "lungs to get bad like they did when I had surgery". He is s/p RATS wedge with Dr. [name]. He wanted to know what medication he could take to help with the congestion in his chest. Advised that he could take over-the-counter Mucinex as needed to help break up the congestion. He acknowledged receipt. Also, should see PCP if symptoms do not improve.

## 2021-05-09 ENCOUNTER — Ambulatory Visit: Payer: Medicare HMO

## 2021-05-09 ENCOUNTER — Ambulatory Visit
Admission: RE | Admit: 2021-05-09 | Discharge: 2021-05-09 | Disposition: A | Discharge status: Home / Self Care | Payer: Medicare HMO | Source: Ambulatory Visit | Attending: Radiation Oncology | Admitting: Radiation Oncology

## 2021-05-09 DIAGNOSIS — Z510 Encounter for antineoplastic radiation therapy: Secondary | ICD-10-CM | POA: Diagnosis not present

## 2021-05-09 DIAGNOSIS — C61 Malignant neoplasm of prostate: Secondary | ICD-10-CM | POA: Diagnosis not present

## 2021-05-11 ENCOUNTER — Other Ambulatory Visit: Payer: Self-pay | Admitting: Radiation Oncology

## 2021-05-11 ENCOUNTER — Ambulatory Visit
Admission: RE | Admit: 2021-05-11 | Discharge: 2021-05-11 | Disposition: A | Discharge status: Home / Self Care | Payer: Medicare HMO | Source: Ambulatory Visit | Attending: Radiation Oncology | Admitting: Radiation Oncology

## 2021-05-11 DIAGNOSIS — C61 Malignant neoplasm of prostate: Secondary | ICD-10-CM | POA: Diagnosis not present

## 2021-05-11 DIAGNOSIS — Z510 Encounter for antineoplastic radiation therapy: Secondary | ICD-10-CM | POA: Diagnosis not present

## 2021-05-11 MED ORDER — TAMSULOSIN HCL 0.4 MG PO CAPS: 0.4000 mg | ORAL_CAPSULE | Freq: Every day | ORAL | 5 refills | Status: DC

## 2021-05-11 MED ORDER — PROCHLORPERAZINE MALEATE 10 MG PO TABS: 10.0000 mg | ORAL_TABLET | Freq: Four times a day (QID) | ORAL | 0 refills | Status: DC | PRN

## 2021-05-12 ENCOUNTER — Ambulatory Visit: Payer: Medicare HMO

## 2021-05-13 ENCOUNTER — Ambulatory Visit: Payer: Medicare HMO

## 2021-05-14 ENCOUNTER — Other Ambulatory Visit: Payer: Self-pay

## 2021-05-14 ENCOUNTER — Ambulatory Visit: Payer: Medicare HMO

## 2021-05-19 ENCOUNTER — Other Ambulatory Visit: Payer: Self-pay

## 2021-05-19 ENCOUNTER — Ambulatory Visit
Admission: RE | Admit: 2021-05-19 | Discharge: 2021-05-19 | Disposition: A | Discharge status: Home / Self Care | Payer: Medicare HMO | Source: Ambulatory Visit | Attending: Radiation Oncology | Admitting: Radiation Oncology

## 2021-05-19 ENCOUNTER — Ambulatory Visit: Payer: Medicare HMO

## 2021-05-19 DIAGNOSIS — C61 Malignant neoplasm of prostate: Secondary | ICD-10-CM | POA: Diagnosis not present

## 2021-05-19 DIAGNOSIS — Z510 Encounter for antineoplastic radiation therapy: Secondary | ICD-10-CM | POA: Diagnosis not present

## 2021-05-20 ENCOUNTER — Ambulatory Visit: Payer: Medicare HMO

## 2021-05-20 DIAGNOSIS — Z510 Encounter for antineoplastic radiation therapy: Secondary | ICD-10-CM | POA: Diagnosis not present

## 2021-05-20 DIAGNOSIS — C61 Malignant neoplasm of prostate: Secondary | ICD-10-CM | POA: Diagnosis not present

## 2021-05-21 ENCOUNTER — Ambulatory Visit
Admission: RE | Admit: 2021-05-21 | Discharge: 2021-05-21 | Disposition: A | Discharge status: Home / Self Care | Payer: Medicare HMO | Source: Ambulatory Visit | Attending: Radiation Oncology | Admitting: Radiation Oncology

## 2021-05-21 ENCOUNTER — Ambulatory Visit: Payer: Medicare HMO

## 2021-05-21 ENCOUNTER — Other Ambulatory Visit: Payer: Self-pay

## 2021-05-21 DIAGNOSIS — Z510 Encounter for antineoplastic radiation therapy: Secondary | ICD-10-CM | POA: Diagnosis not present

## 2021-05-21 DIAGNOSIS — C61 Malignant neoplasm of prostate: Secondary | ICD-10-CM | POA: Diagnosis not present

## 2021-05-22 ENCOUNTER — Ambulatory Visit: Payer: Medicare HMO

## 2021-05-22 DIAGNOSIS — Z510 Encounter for antineoplastic radiation therapy: Secondary | ICD-10-CM | POA: Insufficient documentation

## 2021-05-22 DIAGNOSIS — C61 Malignant neoplasm of prostate: Secondary | ICD-10-CM | POA: Insufficient documentation

## 2021-05-23 ENCOUNTER — Ambulatory Visit: Payer: Medicare HMO

## 2021-05-23 ENCOUNTER — Other Ambulatory Visit: Payer: Self-pay

## 2021-05-23 DIAGNOSIS — C61 Malignant neoplasm of prostate: Secondary | ICD-10-CM | POA: Diagnosis not present

## 2021-05-23 DIAGNOSIS — Z510 Encounter for antineoplastic radiation therapy: Secondary | ICD-10-CM | POA: Diagnosis not present

## 2021-05-26 ENCOUNTER — Ambulatory Visit
Admission: RE | Admit: 2021-05-26 | Discharge: 2021-05-26 | Disposition: A | Discharge status: Home / Self Care | Payer: Medicare HMO | Source: Ambulatory Visit | Attending: Radiation Oncology | Admitting: Radiation Oncology

## 2021-05-26 ENCOUNTER — Other Ambulatory Visit: Payer: Self-pay

## 2021-05-26 ENCOUNTER — Ambulatory Visit: Payer: Medicare HMO

## 2021-05-26 DIAGNOSIS — C61 Malignant neoplasm of prostate: Secondary | ICD-10-CM | POA: Diagnosis not present

## 2021-05-26 DIAGNOSIS — Z510 Encounter for antineoplastic radiation therapy: Secondary | ICD-10-CM | POA: Diagnosis not present

## 2021-05-27 ENCOUNTER — Ambulatory Visit: Payer: Medicare HMO

## 2021-05-27 ENCOUNTER — Ambulatory Visit
Admission: RE | Admit: 2021-05-27 | Discharge: 2021-05-27 | Disposition: A | Discharge status: Home / Self Care | Payer: Medicare HMO | Source: Ambulatory Visit | Attending: Radiation Oncology | Admitting: Radiation Oncology

## 2021-05-27 DIAGNOSIS — Z510 Encounter for antineoplastic radiation therapy: Secondary | ICD-10-CM | POA: Diagnosis not present

## 2021-05-27 DIAGNOSIS — C61 Malignant neoplasm of prostate: Secondary | ICD-10-CM | POA: Diagnosis not present

## 2021-05-28 ENCOUNTER — Ambulatory Visit: Payer: Medicare HMO

## 2021-05-28 ENCOUNTER — Other Ambulatory Visit: Payer: Self-pay

## 2021-05-28 ENCOUNTER — Ambulatory Visit
Admission: RE | Admit: 2021-05-28 | Discharge: 2021-05-28 | Disposition: A | Discharge status: Home / Self Care | Payer: Medicare HMO | Source: Ambulatory Visit | Attending: Radiation Oncology | Admitting: Radiation Oncology

## 2021-05-28 DIAGNOSIS — C61 Malignant neoplasm of prostate: Secondary | ICD-10-CM | POA: Diagnosis not present

## 2021-05-28 DIAGNOSIS — Z510 Encounter for antineoplastic radiation therapy: Secondary | ICD-10-CM | POA: Diagnosis not present

## 2021-05-29 ENCOUNTER — Ambulatory Visit
Admission: RE | Admit: 2021-05-29 | Discharge: 2021-05-29 | Disposition: A | Discharge status: Home / Self Care | Payer: Medicare HMO | Source: Ambulatory Visit | Attending: Radiation Oncology | Admitting: Radiation Oncology

## 2021-05-29 DIAGNOSIS — Z510 Encounter for antineoplastic radiation therapy: Secondary | ICD-10-CM | POA: Diagnosis not present

## 2021-05-29 DIAGNOSIS — C61 Malignant neoplasm of prostate: Secondary | ICD-10-CM | POA: Diagnosis not present

## 2021-05-30 ENCOUNTER — Encounter: Payer: Self-pay | Admitting: Radiation Oncology

## 2021-05-30 ENCOUNTER — Other Ambulatory Visit: Payer: Self-pay

## 2021-05-30 ENCOUNTER — Ambulatory Visit
Admission: RE | Admit: 2021-05-30 | Discharge: 2021-05-30 | Disposition: A | Discharge status: Home / Self Care | Payer: Medicare HMO | Source: Ambulatory Visit | Attending: Radiation Oncology | Admitting: Radiation Oncology

## 2021-05-30 DIAGNOSIS — C61 Malignant neoplasm of prostate: Secondary | ICD-10-CM | POA: Diagnosis not present

## 2021-05-30 DIAGNOSIS — Z510 Encounter for antineoplastic radiation therapy: Secondary | ICD-10-CM | POA: Diagnosis not present

## 2021-05-30 NOTE — Progress Notes (Signed)
  Radiation Oncology         [phone] ________________________________  Name: TAYLOR LEVICK MRN: [id number]  Date: 05/30/2021  DOB: Sep 03, 1950    To whom it may concern:   Menno Bribiesca is under my care for cancer treatment at this time.  Please make appropriate accommodations where appropriate to support his health.   Sincerely,   Tyler Pita, MD [facility] Direct Dial: [phone]  Fax: [phone] [email or web address]

## 2021-06-02 ENCOUNTER — Ambulatory Visit (INDEPENDENT_AMBULATORY_CARE_PROVIDER_SITE_OTHER): Payer: Medicare HMO | Admitting: Podiatry

## 2021-06-02 ENCOUNTER — Ambulatory Visit: Payer: Medicare HMO

## 2021-06-02 ENCOUNTER — Encounter: Payer: Self-pay | Admitting: Podiatry

## 2021-06-02 ENCOUNTER — Ambulatory Visit
Admission: RE | Admit: 2021-06-02 | Discharge: 2021-06-02 | Disposition: A | Discharge status: Home / Self Care | Payer: Medicare HMO | Source: Ambulatory Visit | Attending: Radiation Oncology | Admitting: Radiation Oncology

## 2021-06-02 ENCOUNTER — Other Ambulatory Visit: Payer: Self-pay

## 2021-06-02 DIAGNOSIS — E1149 Type 2 diabetes mellitus with other diabetic neurological complication: Secondary | ICD-10-CM | POA: Diagnosis not present

## 2021-06-02 DIAGNOSIS — C61 Malignant neoplasm of prostate: Secondary | ICD-10-CM | POA: Diagnosis not present

## 2021-06-02 DIAGNOSIS — Q828 Other specified congenital malformations of skin: Secondary | ICD-10-CM | POA: Diagnosis not present

## 2021-06-02 DIAGNOSIS — D689 Coagulation defect, unspecified: Secondary | ICD-10-CM

## 2021-06-02 DIAGNOSIS — M79674 Pain in right toe(s): Secondary | ICD-10-CM | POA: Diagnosis not present

## 2021-06-02 DIAGNOSIS — B351 Tinea unguium: Secondary | ICD-10-CM | POA: Diagnosis not present

## 2021-06-02 DIAGNOSIS — M216X1 Other acquired deformities of right foot: Secondary | ICD-10-CM

## 2021-06-02 DIAGNOSIS — M79675 Pain in left toe(s): Secondary | ICD-10-CM

## 2021-06-02 DIAGNOSIS — Z510 Encounter for antineoplastic radiation therapy: Secondary | ICD-10-CM | POA: Diagnosis not present

## 2021-06-02 DIAGNOSIS — M216X2 Other acquired deformities of left foot: Secondary | ICD-10-CM

## 2021-06-02 NOTE — Progress Notes (Signed)
This patient returns to my office for at risk foot care.  This patient requires this care by a professional since this patient will be at risk due to having diabetes.  This patient is unable to cut nails himself since the patient cannot reach his nails.These nails are painful walking and wearing shoes.  This patient presents for at risk foot care today.  General Appearance  Alert, conversant and in no acute stress.  Vascular  Dorsalis pedis and posterior tibial  pulses are palpable  bilaterally.  Capillary return is within normal limits  bilaterally. Temperature is within normal limits  bilaterally.  Neurologic  Senn-Weinstein monofilament wire test within normal limits  bilaterally. Muscle power within normal limits bilaterally.  Nails Thick disfigured discolored nails with subungual debris  from hallux to fifth toes bilaterally. No evidence of bacterial infection or drainage bilaterally.  Orthopedic  No limitations of motion  feet .  No crepitus or effusions noted.  No bony pathology or digital deformities noted.  Skin  normotropic skin with no porokeratosis noted bilaterally.  No signs of infections or ulcers noted.   Asymptomatic callus sub 5th  B/l.  Onychomycosis  Pain in right toes  Pain in left toes  Porokeratosis sub5th met  B/L asymptomatic.  Consent was obtained for treatment procedures.   Mechanical debridement of nails 1-5  bilaterally performed with a nail nipper.  Filed with dremel without incident.     Return office visit   3 months                  Told patient to return for periodic foot care and evaluation due to potential at risk complications.   Gardiner Barefoot DPM

## 2021-06-03 ENCOUNTER — Ambulatory Visit: Payer: Medicare HMO | Admitting: Internal Medicine

## 2021-06-03 ENCOUNTER — Ambulatory Visit
Admission: RE | Admit: 2021-06-03 | Discharge: 2021-06-03 | Disposition: A | Discharge status: Home / Self Care | Payer: Medicare HMO | Source: Ambulatory Visit | Attending: Radiation Oncology | Admitting: Radiation Oncology

## 2021-06-03 ENCOUNTER — Other Ambulatory Visit: Payer: Self-pay

## 2021-06-03 DIAGNOSIS — Z510 Encounter for antineoplastic radiation therapy: Secondary | ICD-10-CM | POA: Diagnosis not present

## 2021-06-03 DIAGNOSIS — C61 Malignant neoplasm of prostate: Secondary | ICD-10-CM | POA: Diagnosis not present

## 2021-06-04 ENCOUNTER — Ambulatory Visit
Admission: RE | Admit: 2021-06-04 | Discharge: 2021-06-04 | Disposition: A | Discharge status: Home / Self Care | Payer: Medicare HMO | Source: Ambulatory Visit | Attending: Radiation Oncology | Admitting: Radiation Oncology

## 2021-06-04 DIAGNOSIS — Z510 Encounter for antineoplastic radiation therapy: Secondary | ICD-10-CM | POA: Diagnosis not present

## 2021-06-04 DIAGNOSIS — C61 Malignant neoplasm of prostate: Secondary | ICD-10-CM | POA: Diagnosis not present

## 2021-06-05 ENCOUNTER — Other Ambulatory Visit: Payer: Self-pay

## 2021-06-05 ENCOUNTER — Ambulatory Visit
Admission: RE | Admit: 2021-06-05 | Discharge: 2021-06-05 | Disposition: A | Discharge status: Home / Self Care | Payer: Medicare HMO | Source: Ambulatory Visit | Attending: Radiation Oncology | Admitting: Radiation Oncology

## 2021-06-05 DIAGNOSIS — C61 Malignant neoplasm of prostate: Secondary | ICD-10-CM | POA: Diagnosis not present

## 2021-06-05 DIAGNOSIS — Z510 Encounter for antineoplastic radiation therapy: Secondary | ICD-10-CM | POA: Diagnosis not present

## 2021-06-06 ENCOUNTER — Ambulatory Visit: Payer: Medicare HMO

## 2021-06-06 ENCOUNTER — Ambulatory Visit
Admission: RE | Admit: 2021-06-06 | Discharge: 2021-06-06 | Disposition: A | Discharge status: Home / Self Care | Payer: Medicare HMO | Source: Ambulatory Visit | Attending: Radiation Oncology | Admitting: Radiation Oncology

## 2021-06-06 DIAGNOSIS — C61 Malignant neoplasm of prostate: Secondary | ICD-10-CM | POA: Diagnosis not present

## 2021-06-06 DIAGNOSIS — Z510 Encounter for antineoplastic radiation therapy: Secondary | ICD-10-CM | POA: Diagnosis not present

## 2021-06-09 ENCOUNTER — Ambulatory Visit: Payer: Medicare HMO

## 2021-06-10 ENCOUNTER — Other Ambulatory Visit: Payer: Self-pay

## 2021-06-10 ENCOUNTER — Ambulatory Visit: Payer: Medicare HMO

## 2021-06-10 ENCOUNTER — Ambulatory Visit
Admission: RE | Admit: 2021-06-10 | Discharge: 2021-06-10 | Disposition: A | Discharge status: Home / Self Care | Payer: Medicare HMO | Source: Ambulatory Visit | Attending: Radiation Oncology | Admitting: Radiation Oncology

## 2021-06-10 DIAGNOSIS — C61 Malignant neoplasm of prostate: Secondary | ICD-10-CM | POA: Diagnosis not present

## 2021-06-10 DIAGNOSIS — Z510 Encounter for antineoplastic radiation therapy: Secondary | ICD-10-CM | POA: Diagnosis not present

## 2021-06-11 ENCOUNTER — Ambulatory Visit: Payer: Medicare HMO

## 2021-06-11 ENCOUNTER — Ambulatory Visit
Admission: RE | Admit: 2021-06-11 | Discharge: 2021-06-11 | Disposition: A | Discharge status: Home / Self Care | Payer: Medicare HMO | Source: Ambulatory Visit | Attending: Radiation Oncology | Admitting: Radiation Oncology

## 2021-06-11 DIAGNOSIS — Z510 Encounter for antineoplastic radiation therapy: Secondary | ICD-10-CM | POA: Diagnosis not present

## 2021-06-11 DIAGNOSIS — C61 Malignant neoplasm of prostate: Secondary | ICD-10-CM | POA: Diagnosis not present

## 2021-06-12 ENCOUNTER — Ambulatory Visit: Payer: Medicare HMO

## 2021-06-12 ENCOUNTER — Ambulatory Visit
Admission: RE | Admit: 2021-06-12 | Discharge: 2021-06-12 | Disposition: A | Discharge status: Home / Self Care | Payer: Medicare HMO | Source: Ambulatory Visit | Attending: Radiation Oncology | Admitting: Radiation Oncology

## 2021-06-12 ENCOUNTER — Other Ambulatory Visit: Payer: Self-pay

## 2021-06-12 DIAGNOSIS — C61 Malignant neoplasm of prostate: Secondary | ICD-10-CM | POA: Diagnosis not present

## 2021-06-12 DIAGNOSIS — Z510 Encounter for antineoplastic radiation therapy: Secondary | ICD-10-CM | POA: Diagnosis not present

## 2021-06-13 ENCOUNTER — Ambulatory Visit
Admission: RE | Admit: 2021-06-13 | Discharge: 2021-06-13 | Disposition: A | Discharge status: Home / Self Care | Payer: Medicare HMO | Source: Ambulatory Visit | Attending: Radiation Oncology | Admitting: Radiation Oncology

## 2021-06-13 ENCOUNTER — Ambulatory Visit: Payer: Medicare HMO

## 2021-06-13 DIAGNOSIS — Z510 Encounter for antineoplastic radiation therapy: Secondary | ICD-10-CM | POA: Diagnosis not present

## 2021-06-13 DIAGNOSIS — C61 Malignant neoplasm of prostate: Secondary | ICD-10-CM | POA: Diagnosis not present

## 2021-06-16 ENCOUNTER — Ambulatory Visit: Payer: Medicare HMO

## 2021-06-17 ENCOUNTER — Ambulatory Visit: Payer: Medicare HMO

## 2021-06-17 ENCOUNTER — Ambulatory Visit
Admission: RE | Admit: 2021-06-17 | Discharge: 2021-06-17 | Disposition: A | Discharge status: Home / Self Care | Payer: Medicare HMO | Source: Ambulatory Visit | Attending: Radiation Oncology | Admitting: Radiation Oncology

## 2021-06-17 ENCOUNTER — Other Ambulatory Visit: Payer: Self-pay

## 2021-06-17 DIAGNOSIS — Z510 Encounter for antineoplastic radiation therapy: Secondary | ICD-10-CM | POA: Diagnosis not present

## 2021-06-17 DIAGNOSIS — C61 Malignant neoplasm of prostate: Secondary | ICD-10-CM | POA: Diagnosis not present

## 2021-06-18 ENCOUNTER — Ambulatory Visit: Payer: Medicare HMO

## 2021-06-18 ENCOUNTER — Ambulatory Visit
Admission: RE | Admit: 2021-06-18 | Discharge: 2021-06-18 | Disposition: A | Discharge status: Home / Self Care | Payer: Medicare HMO | Source: Ambulatory Visit | Attending: Radiation Oncology | Admitting: Radiation Oncology

## 2021-06-18 DIAGNOSIS — C61 Malignant neoplasm of prostate: Secondary | ICD-10-CM | POA: Diagnosis not present

## 2021-06-18 DIAGNOSIS — Z510 Encounter for antineoplastic radiation therapy: Secondary | ICD-10-CM | POA: Diagnosis not present

## 2021-06-19 ENCOUNTER — Encounter: Payer: Self-pay | Admitting: Urology

## 2021-06-19 ENCOUNTER — Ambulatory Visit: Payer: Medicare HMO

## 2021-06-19 ENCOUNTER — Ambulatory Visit
Admission: RE | Admit: 2021-06-19 | Discharge: 2021-06-19 | Disposition: A | Discharge status: Home / Self Care | Payer: Medicare HMO | Source: Ambulatory Visit | Attending: Radiation Oncology | Admitting: Radiation Oncology

## 2021-06-19 ENCOUNTER — Other Ambulatory Visit: Payer: Self-pay

## 2021-06-19 DIAGNOSIS — Z510 Encounter for antineoplastic radiation therapy: Secondary | ICD-10-CM | POA: Diagnosis not present

## 2021-06-19 DIAGNOSIS — C61 Malignant neoplasm of prostate: Secondary | ICD-10-CM

## 2021-06-23 ENCOUNTER — Other Ambulatory Visit: Payer: Self-pay

## 2021-06-24 ENCOUNTER — Other Ambulatory Visit: Payer: Self-pay

## 2021-06-24 MED ORDER — LATANOPROST 0.005 % OP SOLN
OPHTHALMIC | 3 refills | Status: DC
  Filled 2021-06-24 – 2021-07-17 (×2): qty 2.5, 20d supply, fill #0
  Filled 2022-03-10: qty 2.5, 20d supply, fill #1
  Filled 2022-03-26: qty 2.5, 25d supply, fill #1

## 2021-06-25 DIAGNOSIS — Z0100 Encounter for examination of eyes and vision without abnormal findings: Secondary | ICD-10-CM | POA: Diagnosis not present

## 2021-06-25 DIAGNOSIS — H524 Presbyopia: Secondary | ICD-10-CM | POA: Diagnosis not present

## 2021-07-14 DIAGNOSIS — H401131 Primary open-angle glaucoma, bilateral, mild stage: Secondary | ICD-10-CM | POA: Diagnosis not present

## 2021-07-15 ENCOUNTER — Encounter: Payer: Self-pay | Admitting: Urology

## 2021-07-15 NOTE — Progress Notes (Signed)
Spoke w/ patient, verified identity, and begin nursing interview. Patient reports an uncomfortable tingling sensation in the prostate, otherwise doing okay." No other symptoms reported at this time.  Meaningful use complete. I-PSS score of 5 (mild) Currently Flomax 0.4mg  as directed. Urology follow-up- February 8th, 2023  Patient notified of 10:00am-07/24/21 telephone appointment w/ Ashlyn Bruning PA-C. I left my extension in case patient needs to call. Patient verbalized understanding of information given.  Patient contact [phone]

## 2021-07-17 ENCOUNTER — Other Ambulatory Visit: Payer: Self-pay

## 2021-07-24 ENCOUNTER — Ambulatory Visit
Admission: RE | Admit: 2021-07-24 | Discharge: 2021-07-24 | Disposition: A | Discharge status: Home / Self Care | Payer: Medicare HMO | Source: Ambulatory Visit | Attending: Urology | Admitting: Urology

## 2021-07-24 DIAGNOSIS — C61 Malignant neoplasm of prostate: Secondary | ICD-10-CM

## 2021-07-24 NOTE — Progress Notes (Signed)
°  Radiation Oncology         [phone] ________________________________  Name: [name] MRN: [id number]  Date: 06/19/2021  DOB: Oct 17, 1950  End of Treatment Note  Diagnosis:   71 y.o. gentleman with Stage T1c adenocarcinoma of the prostate with Gleason score of 4+3, and PSA of 33.50.     Indication for treatment:  Curative, Definitive Radiotherapy concurrent with ADT (6 month Eligard 01/24/21)      Radiation treatment dates:   04/15/21 - 06/19/21  Site/dose:  1. The prostate, seminal vesicles, and pelvic lymph nodes were initially treated to 45 Gy in 25 fractions of 1.8 Gy  2. The prostate only was boosted to 75 Gy with 15 additional fractions of 2.0 Gy   Beams/energy:  1. The prostate, seminal vesicles, and pelvic lymph nodes were initially treated using VMAT intensity modulated radiotherapy delivering 6 megavolt photons. Image guidance was performed with CB-CT studies prior to each fraction. He was immobilized with a body fix lower extremity mold.  2. the prostate only was boosted using VMAT intensity modulated radiotherapy delivering 6 megavolt photons. Image guidance was performed with CB-CT studies prior to each fraction. He was immobilized with a body fix lower extremity mold.  Narrative: The patient tolerated radiation treatment relatively well with only minor urinary irritation and modest fatigue.  He reported increased frequency, urgency and nocturia 3-4x/night which improved with Flomax.  Plan: The patient has completed radiation treatment. He will return to radiation oncology clinic for routine followup in one month. I advised him to call or return sooner if he has any questions or concerns related to his recovery or treatment. ________________________________  Sheral Apley. Tammi Klippel, M.D.

## 2021-07-24 NOTE — Progress Notes (Signed)
Radiation Oncology         ([phone] ________________________________  Name: RAYHAAN HUSTER MRN: [id number]  Date: 07/24/2021  DOB: [birth year]  Post Treatment Note  CC: [name], MD  Festus Aloe, MD  Diagnosis:   71 y.o. gentleman with Stage T1c adenocarcinoma of the prostate with Gleason score of 4+3, and PSA of 33.50.  Interval Since Last Radiation:  5 weeks  04/15/21 - 06/19/21 (concurrent with ADT- 6 month Eligard 01/24/21)     1. The prostate, seminal vesicles, and pelvic lymph nodes were initially treated to 45 Gy in 25 fractions of 1.8 Gy  2. The prostate only was boosted to 75 Gy with 15 additional fractions of 2.0 Gy   Narrative:  I spoke with the patient to conduct his routine scheduled 1 month follow up visit via telephone to spare the patient unnecessary potential exposure in the healthcare setting during the current COVID-19 pandemic.  The patient was notified in advance and gave permission to proceed with this visit format.  He tolerated radiation treatment relatively well with only minor urinary irritation and modest fatigue.  He reported increased frequency, urgency and nocturia 3-4x/night which improved with Flomax.                              On review of systems, the patient states that he is doing well in general.  He reports gradual improvement in his TS continues taking Flomax daily as prescribed.  Current IPSS score is 5 with nocturia x3 and mildly weaker flow of stream but he specifically denies dysuria, gross hematuria, excessive daytime frequency, urgency, straining to void, incomplete bladder emptying or incontinence.  He reports a healthy appetite and is maintaining his weight.  Denies abdominal pain, nausea, vomiting, diarrhea or constipation.  Not like the effects associated with ADT, particularly decreased libido, hot flashes and decreased stamina and therefore does not intend to continue the ADT for the recommended 18-24 months.  Otherwise,  he is pleased with his progress to date.  ALLERGIES:  is allergic to lisinopril.  Meds: Current Outpatient Medications  Medication Sig Dispense Refill   amLODipine (NORVASC) 10 MG tablet Take 1 tablet (10 mg total) by mouth daily. 90 tablet 1   apixaban (ELIQUIS) 5 MG TABS tablet TAKE 1 TABLET BY MOUTH TWICE DAILY. 60 tablet 2   B Complex Vitamins (VITAMIN B COMPLEX) TABS Take 1 tablet by mouth daily.     cyclobenzaprine (FLEXERIL) 10 MG tablet Take 1 tablet (10 mg total) by mouth 3 (three) times daily as needed for muscle spasms. 30 tablet 0   diclofenac Sodium (VOLTAREN) 1 % GEL Apply 2 g topically 3 (three) times daily as needed (pain). 100 g 0   fluticasone (FLONASE) 50 MCG/ACT nasal spray Place 1 spray into both nostrils daily as needed for allergies or rhinitis.     gabapentin (NEURONTIN) 100 MG capsule TAKE 1 CAPSULE (100 MG TOTAL) BY MOUTH 3 (THREE) TIMES DAILY. NEEDED FOR NUMBNESS 90 capsule 0   glimepiride (AMARYL) 1 MG tablet Take 1 tablet (1 mg total) by mouth daily with breakfast. 30 tablet 3   HYDROcodone-acetaminophen (NORCO/VICODIN) 5-325 MG tablet Take 1 tablet by mouth every 4 (four) hours as needed for moderate pain ((score 4 to 6)). 30 tablet 0   hydrOXYzine (ATARAX/VISTARIL) 25 MG tablet TAKE 1 TABLET (25 MG TOTAL) BY MOUTH 2 (TWO) TIMES DAILY AS NEEDED. 30 tablet 2  latanoprost (XALATAN) 0.005 % ophthalmic solution Place 1 drop into both eyes nightly. 2.5 mL 3   latanoprost (XALATAN) 0.005 % ophthalmic solution Place 1 drop into both eyes nightly. 2.5 mL 3   Multiple Vitamin (MULTIVITAMIN WITH MINERALS) TABS tablet Take 1 tablet by mouth daily.     pravastatin (PRAVACHOL) 10 MG tablet Take 1 tablet (10 mg total) by mouth daily. 30 tablet 4   prochlorperazine (COMPAZINE) 10 MG tablet Take 1 tablet (10 mg total) by mouth every 6 (six) hours as needed for nausea or vomiting. 30 tablet 0   sennosides-docusate sodium (SENOKOT-S) 8.6-50 MG tablet Take 1 tablet by mouth daily.  15 tablet 0   tamsulosin (FLOMAX) 0.4 MG CAPS capsule Take 1 capsule (0.4 mg total) by mouth daily after supper. 30 capsule 5   No current facility-administered medications for this encounter.    Physical Findings:  vitals were not taken for this visit.  Pain Assessment Pain Score: 0-No pain/10 Unable to assess due to telephone follow up visit format.  Lab Findings: Lab Results  Component Value Date   WBC 7.4 03/12/2021   HGB 12.6 (L) 03/12/2021   HCT 37.0 (L) 03/12/2021   MCV 88.9 03/12/2021   PLT 238 03/12/2021     Radiographic Findings: No results found.  Impression/Plan: 1. 71 y.o. gentleman with Stage T1c adenocarcinoma of the prostate with Gleason score of 4+3, and PSA of 33.50. He will continue to follow up with urology for ongoing PSA determinations and has an appointment scheduled with Dr. Junious Silk on 07/30/21 when he will be due for his next ADT injection. However, he tells me that he does not plan to continue the ADT because he cannot tolerate the side effects so I advised him to discuss this further with Dr. Junious Silk at his upcoming visit. He understands what to expect with regards to PSA monitoring going forward. I will look forward to following his response to treatment via correspondence with urology, and would be happy to continue to participate in his care if clinically indicated. I talked to the patient about what to expect in the future and answered all of his questions to his stated satisfaction. I encouraged him to call or return to the office if he has any questions regarding his previous radiation or possible radiation side effects. He was comfortable with this plan and will follow up as needed.     Nicholos Johns, PA-C

## 2021-07-30 DIAGNOSIS — C61 Malignant neoplasm of prostate: Secondary | ICD-10-CM | POA: Diagnosis not present

## 2021-08-01 ENCOUNTER — Other Ambulatory Visit: Payer: Self-pay

## 2021-08-04 ENCOUNTER — Other Ambulatory Visit: Payer: Self-pay

## 2021-08-07 DIAGNOSIS — H401131 Primary open-angle glaucoma, bilateral, mild stage: Secondary | ICD-10-CM | POA: Diagnosis not present

## 2021-08-07 DIAGNOSIS — H04123 Dry eye syndrome of bilateral lacrimal glands: Secondary | ICD-10-CM | POA: Diagnosis not present

## 2021-08-28 ENCOUNTER — Telehealth: Payer: Self-pay | Admitting: *Deleted

## 2021-09-01 ENCOUNTER — Ambulatory Visit (INDEPENDENT_AMBULATORY_CARE_PROVIDER_SITE_OTHER): Payer: Medicare HMO | Admitting: Podiatry

## 2021-09-01 ENCOUNTER — Encounter: Payer: Self-pay | Admitting: Podiatry

## 2021-09-01 ENCOUNTER — Other Ambulatory Visit: Payer: Self-pay

## 2021-09-01 DIAGNOSIS — M79675 Pain in left toe(s): Secondary | ICD-10-CM

## 2021-09-01 DIAGNOSIS — E1149 Type 2 diabetes mellitus with other diabetic neurological complication: Secondary | ICD-10-CM

## 2021-09-01 DIAGNOSIS — M79674 Pain in right toe(s): Secondary | ICD-10-CM | POA: Diagnosis not present

## 2021-09-01 DIAGNOSIS — B351 Tinea unguium: Secondary | ICD-10-CM

## 2021-09-01 DIAGNOSIS — D689 Coagulation defect, unspecified: Secondary | ICD-10-CM

## 2021-09-01 DIAGNOSIS — M216X1 Other acquired deformities of right foot: Secondary | ICD-10-CM

## 2021-09-01 DIAGNOSIS — M216X2 Other acquired deformities of left foot: Secondary | ICD-10-CM

## 2021-09-01 NOTE — Progress Notes (Signed)
This patient returns to my office for at risk foot care.  This patient requires this care by a professional since this patient will be at risk due to having diabetes.  This patient is unable to cut nails himself since the patient cannot reach his nails.These nails are painful walking and wearing shoes.  This patient presents for at risk foot care today. ? ?General Appearance  Alert, conversant and in no acute stress. ? ?Vascular  Dorsalis pedis and posterior tibial  pulses are palpable  bilaterally.  Capillary return is within normal limits  bilaterally. Temperature is within normal limits  bilaterally. ? ?Neurologic  Senn-Weinstein monofilament wire test diminished bilaterally. Muscle power within normal limits bilaterally. ? ?Nails Thick disfigured discolored nails with subungual debris  from hallux to fifth toes bilaterally. No evidence of bacterial infection or drainage bilaterally. ? ?Orthopedic  No limitations of motion  feet .  No crepitus or effusions noted.  No bony pathology or digital deformities noted.  Plantar flexed fifth metatarsal  B/L. ? ?Skin  normotropic skin with no porokeratosis noted bilaterally.  No signs of infections or ulcers noted.   Asymptomatic callus sub 5th  B/l. ? ?Onychomycosis  Pain in right toes  Pain in left toes  Porokeratosis sub5th met  B/L asymptomatic. ? ?Consent was obtained for treatment procedures.   Mechanical debridement of nails 1-5  bilaterally performed with a nail nipper.  Filed with dremel without incident.  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 ? ? ?Return office visit   3 months                  Told patient to return for periodic foot care and evaluation due to potential at risk complications. ? ? ?Gardiner Barefoot DPM  ?

## 2021-09-08 ENCOUNTER — Telehealth: Payer: Self-pay | Admitting: Internal Medicine

## 2021-09-08 ENCOUNTER — Ambulatory Visit: Payer: Medicare HMO

## 2021-09-08 ENCOUNTER — Other Ambulatory Visit: Payer: Self-pay

## 2021-09-08 DIAGNOSIS — E1149 Type 2 diabetes mellitus with other diabetic neurological complication: Secondary | ICD-10-CM

## 2021-09-08 NOTE — Progress Notes (Signed)
SITUATION ?Reason for Consult: Evaluation for Prefabricated Diabetic Shoes and Custom Diabetic Inserts. ?Patient / Caregiver Report: Patient would like well fitting shoes ? ?OBJECTIVE DATA: ?Patient History / Diagnosis:  ?  ICD-10-CM   ?1. Type 2 diabetes mellitus with other neurologic complication, without long-term current use of insulin (HCC)  E11.49   ?  ? ?Physician treating diabetes:  [name], MD ? ?Current or Previous Devices:   Current user ? ?In-Person Foot Examination: ?Ulcers & Callousing:   None ?Deformities:    Pes planus ?Sensation:    Compromised  ?Shoe Size:     14W ? ?ORTHOTIC RECOMMENDATION ?Recommended Devices: ?- 1x pair prefabricated PDAC approved diabetic shoes; Patient Selected Apex X801M Size 14W ?- 3x pair custom-to-patient PDAC approved vacuum formed diabetic insoles. ? ?GOALS OF SHOES AND INSOLES ?- Reduce shear and pressure ?- Reduce / Prevent callus formation ?- Reduce / Prevent ulceration ?- Protect the fragile healing compromised diabetic foot. ? ?Patient would benefit from diabetic shoes and inserts as patient has diabetes mellitus and the patient has one or more of the following conditions: ?- History of partial or complete amputation of the foot ?- History of previous foot ulceration. ?- History of pre-ulcerative callus ?- Peripheral neuropathy with evidence of callus formation ?- Foot deformity ?- Poor circulation ? ?ACTIONS PERFORMED ?Potential out of pocket cost was communicated to patient. Patient understood and consented to measurement and casting. Patient was casted for insoles via crush box and measured for shoes via brannock device. Procedure was explained and patient tolerated procedure well. All questions were answered and concerns addressed. Casts were shipped to central fabrication for HOLD until Certificate of Medical Necessity or otherwise necessary authorization from insurance is obtained. ? ?PLAN ?Shoes are to be ordered and casts released from hold once  all appropriate paperwork is complete. Patient is to be contacted and scheduled for fitting once shoes and insoles have been fabricated and received. ? ?

## 2021-09-08 NOTE — Telephone Encounter (Signed)
[name] with [employer] is going to send over a certificate for medical necessity for Diabetic Shoes. CB- [phone] ?

## 2021-09-12 NOTE — Telephone Encounter (Signed)
Returned call and made aware that we have not received any paperwork on patient and f they can refax. Per receptionist Aaron Edelman is not in and will leave him a message  ?

## 2021-09-16 ENCOUNTER — Other Ambulatory Visit: Payer: Self-pay | Admitting: Internal Medicine

## 2021-09-16 ENCOUNTER — Other Ambulatory Visit: Payer: Self-pay

## 2021-09-16 DIAGNOSIS — I10 Essential (primary) hypertension: Secondary | ICD-10-CM

## 2021-09-17 ENCOUNTER — Other Ambulatory Visit: Payer: Self-pay

## 2021-09-17 MED ORDER — AMLODIPINE BESYLATE 10 MG PO TABS
10.0000 mg | ORAL_TABLET | Freq: Every day | ORAL | 0 refills | Status: DC
  Filled 2021-09-17: qty 30, 30d supply, fill #0

## 2021-09-22 ENCOUNTER — Telehealth: Payer: Self-pay

## 2021-09-22 ENCOUNTER — Other Ambulatory Visit: Payer: Self-pay

## 2021-09-22 DIAGNOSIS — T1501XA Foreign body in cornea, right eye, initial encounter: Secondary | ICD-10-CM | POA: Diagnosis not present

## 2021-09-22 NOTE — Telephone Encounter (Signed)
Lvm for pt that I would resend paperwork to PCP office.

## 2021-09-22 NOTE — Telephone Encounter (Signed)
Patient would like to know what other information you need from him to order his diabetic shoes?

## 2021-09-24 ENCOUNTER — Telehealth: Payer: Self-pay | Admitting: Internal Medicine

## 2021-09-24 NOTE — Telephone Encounter (Signed)
Copied from [place] [id number]. Topic: General - Other >> Sep 24, 2021 11:12 AM Tessa Lerner A wrote: Reason for CRM: The patient has called to check on the status of the resubmission of their prescription for diabetic shoes   Please contact further

## 2021-09-25 NOTE — Telephone Encounter (Signed)
Returned pt call and made aware that we haven't  received any form for his dm shoes. Made pt aware that we have bene having issues with our fax and if he would like he can bring [name] a copy of the form. Pt states he would prefer to do that cause he has been waiting to get his shoes. Pt doesn't have any questions or concerns  ?

## 2021-09-29 ENCOUNTER — Telehealth: Payer: Self-pay | Admitting: Podiatry

## 2021-09-29 NOTE — Telephone Encounter (Signed)
Pt called and the pcp is having a hard time getting the documents for his diabetic shoes and pt would like to stop by the office to pick the forms up.. ? ?

## 2021-10-01 ENCOUNTER — Ambulatory Visit: Payer: Self-pay | Admitting: *Deleted

## 2021-10-01 NOTE — Telephone Encounter (Signed)
Summary: Covid positive / Diarrhea  ? Pt stated he tested positive for COVID this morning. Pt stated he is feeling a lot of fatigue has a fever of 100.00 diarrhea as well as a headache.  ?Pt stated he has an appointment tomorrow however, he wants to cancel and is unsure if he should come in. I advise he could if he wanted to. Pt will wait to speak with the nurse.  ? ? ? ?Seeking clinical advice.   ?  ? ?Reason for Disposition ? [1] HIGH RISK for severe COVID complications (e.g., weak immune system, age > 71 years, obesity with BMI > 25, pregnant, chronic lung disease or other chronic medical condition) AND [2] COVID symptoms (e.g., cough, fever)  (Exceptions: Already seen by PCP and no new or worsening symptoms.) ? ?Answer Assessment - Initial Assessment Questions ?1. COVID-19 DIAGNOSIS: "Who made your COVID-19 diagnosis?" "Was it confirmed by a positive lab test or self-test?" If not diagnosed by a doctor (or NP/PA), ask "Are there lots of cases (community spread) where you live?" Note: See public health department website, if unsure. ?    + home test today ?2. COVID-19 EXPOSURE: "Was there any known exposure to COVID before the symptoms began?" CDC Definition of close contact: within 6 feet (2 meters) for a total of 15 minutes or more over a 24-hour period.  ?      ?3. ONSET: "When did the COVID-19 symptoms start?"  ?    Symptoms started last night with diarrhea ?4. WORST SYMPTOM: "What is your worst symptom?" (e.g., cough, fever, shortness of breath, muscle aches) ?    Fatigue, diarrhea ?5. COUGH: "Do you have a cough?" If Yes, ask: "How bad is the cough?"   ?    Dry cough- just started this morning ?6. FEVER: "Do you have a fever?" If Yes, ask: "What is your temperature, how was it measured, and when did it start?" ?    100- this morning- 8:00- Tylenol  ?7. RESPIRATORY STATUS: "Describe your breathing?" (e.g., shortness of breath, wheezing, unable to speak)  ?    Labored- SOB- a little ?8. BETTER-SAME-WORSE:  "Are you getting better, staying the same or getting worse compared to yesterday?"  If getting worse, ask, "In what way?" ?    Worse- increased symptoms ?9. HIGH RISK DISEASE: "Do you have any chronic medical problems?" (e.g., asthma, heart or lung disease, weak immune system, obesity, etc.) ?    Age, diabetic  ?10. VACCINE: "Have you had the COVID-19 vaccine?" If Yes, ask: "Which one, how many shots, when did you get it?" ?      yes ?11. BOOSTER: "Have you received your COVID-19 booster?" If Yes, ask: "Which one and when did you get it?" ?      Yes- 01/2021 ?12. PREGNANCY: "Is there any chance you are pregnant?" "When was your last menstrual period?" ?        ?13. OTHER SYMPTOMS: "Do you have any other symptoms?"  (e.g., chills, fatigue, headache, loss of smell or taste, muscle pain, sore throat) ?      Fatigue, headache, sore throat ?14. O2 SATURATION MONITOR:  "Do you use an oxygen saturation monitor (pulse oximeter) at home?" If Yes, ask "What is your reading (oxygen level) today?" "What is your usual oxygen saturation reading?" (e.g., 95%) ? ?Protocols used: Coronavirus (HERDE-08) Diagnosed or Suspected-A-AH ? ?

## 2021-10-01 NOTE — Telephone Encounter (Signed)
Patient spoke with CMA,J. [name].  ?[place] with virtual apt tomorrow with PCP.  ?No apt available today. ?

## 2021-10-01 NOTE — Telephone Encounter (Addendum)
?  Chief Complaint: + COVID ?Symptoms: diarrhea, slight /mild SOB,cough, fatigue, sore throat, headache ?Frequency: symptoms started last night ?Pertinent Negatives: Patient denies   ?Disposition: '[]'$ ED /'[]'$ Urgent Care (no appt availability in office) / '[x]'$ Appointment(In office/virtual)/ '[]'$  [facility]/ '[]'$ Home Care/ '[]'$ Refused Recommended Disposition /'[]'$ [facility]/ '[]'$  Follow-up with PCP ?Additional Notes: Patient has appointment scheduled for tomorrow- needs to change to phone appointment- request phone appointment today if possible  ?COVID protocol reviewed treatment/isolation, advised home care advise for symptoms- diarrhea, sore throat,fever.  ?

## 2021-10-02 ENCOUNTER — Other Ambulatory Visit: Payer: Self-pay

## 2021-10-02 ENCOUNTER — Encounter: Payer: Self-pay | Admitting: Internal Medicine

## 2021-10-02 ENCOUNTER — Ambulatory Visit: Payer: Medicare HMO | Attending: Internal Medicine | Admitting: Internal Medicine

## 2021-10-02 DIAGNOSIS — U071 COVID-19: Secondary | ICD-10-CM

## 2021-10-02 MED ORDER — MOLNUPIRAVIR EUA 200MG CAPSULE
4.0000 | ORAL_CAPSULE | Freq: Two times a day (BID) | ORAL | 0 refills | Status: AC
Start: 2021-10-02 — End: 2021-10-08
  Filled 2021-10-02: qty 40, 5d supply, fill #0

## 2021-10-02 NOTE — Progress Notes (Signed)
Patient ID: [name], male   DOB: 02-Feb-1951, 71 y.o.   MRN: [id number] ?Virtual Visit via Telephone Note ? ?I connected with [name] on 10/02/2021 at 1:58 p.m by telephone and verified that I am speaking with the correct person using two identifiers ? ?Location: ?Patient: home ?Provider: office ? ?Participants: ?Myself ?Patient ?  ?I discussed the limitations, risks, security and privacy concerns of performing an evaluation and management service by telephone and the availability of in person appointments. I also discussed with the patient that there may be a patient responsible charge related to this service. The patient expressed understanding and agreed to proceed. ? ? 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 cocaine abuse in remission since 2005, LT pneumothorax.  This is an UC visit. ? ?Pt dx with COVID yesterday by using a home test.  ?He decided to test after he developed dry cough, fatigue and diarrhea for 2 days.  He had temp of 101 yesterday and this morning.  Reports slight shortness of breath.  Last COVID booster was 01/2021.  He has been eating chicken broth and chicken noodle soup.  He has been using some Imodium. ? ? ?Outpatient Encounter Medications as of 10/02/2021  ?Medication Sig Note  ? amLODipine (NORVASC) 10 MG tablet Take 1 tablet (10 mg total) by mouth daily.   ? apixaban (ELIQUIS) 5 MG TABS tablet TAKE 1 TABLET BY MOUTH TWICE DAILY.   ? B Complex Vitamins (VITAMIN B COMPLEX) TABS Take 1 tablet by mouth daily.   ? cyclobenzaprine (FLEXERIL) 10 MG tablet Take 1 tablet (10 mg total) by mouth 3 (three) times daily as needed for muscle spasms. 03/25/2021: PRN  ? diclofenac Sodium (VOLTAREN) 1 % GEL Apply 2 g topically 3 (three) times daily as needed (pain). 03/25/2021: PRN  ? fluticasone (FLONASE) 50 MCG/ACT nasal spray Place 1 spray into  both nostrils daily as needed for allergies or rhinitis. 03/25/2021: PRN  ? gabapentin (NEURONTIN) 100 MG capsule TAKE 1 CAPSULE (100 MG TOTAL) BY MOUTH 3 (THREE) TIMES DAILY. NEEDED FOR NUMBNESS   ? glimepiride (AMARYL) 1 MG tablet Take 1 tablet (1 mg total) by mouth daily with breakfast.   ? HYDROcodone-acetaminophen (NORCO/VICODIN) 5-325 MG tablet Take 1 tablet by mouth every 4 (four) hours as needed for moderate pain ((score 4 to 6)). 03/25/2021: PRN  ? hydrOXYzine (ATARAX/VISTARIL) 25 MG tablet TAKE 1 TABLET (25 MG TOTAL) BY MOUTH 2 (TWO) TIMES DAILY AS NEEDED.   ? latanoprost (XALATAN) 0.005 % ophthalmic solution Place 1 drop into both eyes nightly.   ? latanoprost (XALATAN) 0.005 % ophthalmic solution Place 1 drop into both eyes nightly.   ? Multiple Vitamin (MULTIVITAMIN WITH MINERALS) TABS tablet Take 1 tablet by mouth daily.   ? pravastatin (PRAVACHOL) 10 MG tablet Take 1 tablet (10 mg total) by mouth daily.   ? prochlorperazine (COMPAZINE) 10 MG tablet Take 1 tablet (10 mg total) by mouth every 6 (six) hours as needed for nausea or vomiting.   ? sennosides-docusate sodium (SENOKOT-S) 8.6-50 MG tablet Take 1 tablet by mouth daily. 03/25/2021: daily  ? tamsulosin (FLOMAX) 0.4 MG CAPS capsule Take 1 capsule (0.4 mg total) by mouth daily after supper.   ? ?No facility-administered encounter medications on file as of 10/02/2021.  ? ? ?  ?Observations/Objective: ?No direct observation done as this  was a telephone visit.  Patient did not sound short of breath on the phone.  He was talking in complete sentences. ? ?Assessment and Plan: ?1. COVID-19 virus infection ?-Patient given self-isolation instructions outlined below.Marland Kitchen ?We discussed putting him on one of the antivirals.  At first I was going to put him on the Paxlovid however it can interact with Eliquis increasing its Travis so we decided to go with molnupiravir.  Went over possible side effects of the medication including diarrhea, nausea and  dizziness. ?Advised to push fluids. ?Advised that if symptoms worsen especially shortness of breath, he should not hesitate to be seen in the emergency room. ?- molnupiravir EUA (LAGEVRIO) 200 mg CAPS capsule; Take 4 capsules (800 mg total) by mouth 2 (two) times daily for 5 days.  Dispense: 40 capsule; Refill: 0 ? ?Criteria for self-isolation if you test positive for COVID 19 regardless of vaccine status: ?If you have mild symptoms that are resolving or have resolved, isolate at home for 5 days since symptoms started and continue to wear a well fitted mask when around others in the home and in public for 5 additional days after isolation is completed. ? ?If you have a fever and/or moderate to severe symptoms, isolate for at least 10 days since the symptoms started and until you are fever free for at least 24 hours without the use of fever reducing medications. ?If you tested positive and did not have symptoms, isolate for at least 5 days after your positive test. ? ?Use over-the-counter medications for symptoms.  If you develop respiratory issues/distress, seek medical care in the emergency department.  If you must leave home or if you have to be around others, please wear a mask.  Please limit contact with immediate family members in the home, practice social distancing, frequent handwashing and clean hard surfaces touched frequently with household cleaning products.  Members of your household will also need to quarantine and be tested.' ? ?Follow Up Instructions: ?PRN ?  ?I discussed the assessment and treatment plan with the patient. The patient was provided an opportunity to ask questions and all were answered. The patient agreed with the plan and demonstrated an understanding of the instructions. ?  ?The patient was advised to call back or seek an in-person evaluation if the symptoms worsen or if the condition fails to improve as anticipated. ? ?I  Spent 11 minutes on this telephone encounter ? ?This note has  been created with [employer], quantity. Any transcriptional errors are unintentional. ? ?Karle Plumber, MD ? ?

## 2021-10-06 ENCOUNTER — Other Ambulatory Visit: Payer: Self-pay

## 2021-10-09 ENCOUNTER — Other Ambulatory Visit: Payer: Self-pay

## 2021-10-13 ENCOUNTER — Telehealth: Payer: Self-pay

## 2021-10-13 NOTE — Telephone Encounter (Signed)
Shoes ordered -  ?[employer] [place] [id number] ?

## 2021-10-14 ENCOUNTER — Telehealth: Payer: Self-pay | Admitting: Internal Medicine

## 2021-10-14 NOTE — Telephone Encounter (Signed)
Copied from [place] [id number]. Topic: General - Other >> Oct 13, 2021  8:27 AM Alanda Slim E wrote: Reason for CRM: pts order for diabetic shoes will be resent to the office today/ please advise pt if this is received and fax back asap/ pts first set of paperwork had the wrong name on it

## 2021-10-16 NOTE — Telephone Encounter (Signed)
Patient states order would come from PCP. He states he brought order to this office.  ? ?

## 2021-10-16 NOTE — Telephone Encounter (Signed)
Paperwork received today. Will hold for his apt.  ? ?

## 2021-11-06 ENCOUNTER — Other Ambulatory Visit: Payer: Self-pay

## 2021-11-10 NOTE — Telephone Encounter (Signed)
Pt has appt on 6/05. Please make sure Levada Dy has the DM paperwork for his shoes.  He is not seeing Dr [name].

## 2021-11-14 ENCOUNTER — Other Ambulatory Visit: Payer: Self-pay

## 2021-11-14 ENCOUNTER — Other Ambulatory Visit: Payer: Self-pay | Admitting: Family Medicine

## 2021-11-14 MED ORDER — DICLOFENAC SODIUM 1 % EX GEL
2.0000 g | Freq: Three times a day (TID) | CUTANEOUS | 0 refills | Status: DC | PRN
  Filled 2021-11-14: qty 100, 17d supply, fill #0

## 2021-11-17 ENCOUNTER — Emergency Department (HOSPITAL_COMMUNITY)
Admission: EM | Admit: 2021-11-17 | Discharge: 2021-11-17 | Disposition: A | Discharge status: Home / Self Care | Payer: Medicare HMO | Attending: Emergency Medicine | Admitting: Emergency Medicine

## 2021-11-17 ENCOUNTER — Encounter (HOSPITAL_COMMUNITY): Payer: Self-pay | Admitting: Emergency Medicine

## 2021-11-17 ENCOUNTER — Other Ambulatory Visit: Payer: Self-pay

## 2021-11-17 DIAGNOSIS — Z7901 Long term (current) use of anticoagulants: Secondary | ICD-10-CM | POA: Insufficient documentation

## 2021-11-17 DIAGNOSIS — R109 Unspecified abdominal pain: Secondary | ICD-10-CM | POA: Insufficient documentation

## 2021-11-17 DIAGNOSIS — Z79899 Other long term (current) drug therapy: Secondary | ICD-10-CM | POA: Diagnosis not present

## 2021-11-17 DIAGNOSIS — M5442 Lumbago with sciatica, left side: Secondary | ICD-10-CM | POA: Insufficient documentation

## 2021-11-17 DIAGNOSIS — I10 Essential (primary) hypertension: Secondary | ICD-10-CM | POA: Insufficient documentation

## 2021-11-17 DIAGNOSIS — M5136 Other intervertebral disc degeneration, lumbar region: Secondary | ICD-10-CM | POA: Insufficient documentation

## 2021-11-17 DIAGNOSIS — M545 Low back pain, unspecified: Secondary | ICD-10-CM | POA: Diagnosis present

## 2021-11-17 LAB — CBC WITH DIFFERENTIAL/PLATELET
Abs Immature Granulocytes: 0.01 10*3/uL (ref 0.00–0.07)
Basophils Absolute: 0 10*3/uL (ref 0.0–0.1)
Basophils Relative: 1 %
Eosinophils Absolute: 0 10*3/uL (ref 0.0–0.5)
Eosinophils Relative: 1 %
HCT: 39 % (ref 39.0–52.0)
Hemoglobin: 12.9 g/dL — ABNORMAL LOW (ref 13.0–17.0)
Immature Granulocytes: 0 %
Lymphocytes Relative: 22 %
Lymphs Abs: 0.7 10*3/uL (ref 0.7–4.0)
MCH: 29.9 pg (ref 26.0–34.0)
MCHC: 33.1 g/dL (ref 30.0–36.0)
MCV: 90.5 fL (ref 80.0–100.0)
Monocytes Absolute: 0.2 10*3/uL (ref 0.1–1.0)
Monocytes Relative: 7 %
Neutro Abs: 2.3 10*3/uL (ref 1.7–7.7)
Neutrophils Relative %: 69 %
Platelets: 274 10*3/uL (ref 150–400)
RBC: 4.31 MIL/uL (ref 4.22–5.81)
RDW: 14.7 % (ref 11.5–15.5)
WBC: 3.4 10*3/uL — ABNORMAL LOW (ref 4.0–10.5)
nRBC: 0 % (ref 0.0–0.2)

## 2021-11-17 LAB — COMPREHENSIVE METABOLIC PANEL
ALT: 12 U/L (ref 0–44)
AST: 14 U/L — ABNORMAL LOW (ref 15–41)
Albumin: 3.8 g/dL (ref 3.5–5.0)
Alkaline Phosphatase: 77 U/L (ref 38–126)
Anion gap: 7 (ref 5–15)
BUN: 16 mg/dL (ref 8–23)
CO2: 23 mmol/L (ref 22–32)
Calcium: 9 mg/dL (ref 8.9–10.3)
Chloride: 106 mmol/L (ref 98–111)
Creatinine, Ser: 1.36 mg/dL — ABNORMAL HIGH (ref 0.61–1.24)
GFR, Estimated: 56 mL/min — ABNORMAL LOW (ref >60)
Glucose, Bld: 167 mg/dL — ABNORMAL HIGH (ref 70–99)
Potassium: 4.2 mmol/L (ref 3.5–5.1)
Sodium: 136 mmol/L (ref 135–145)
Total Bilirubin: 0.3 mg/dL (ref 0.3–1.2)
Total Protein: 7 g/dL (ref 6.5–8.1)

## 2021-11-17 LAB — LIPASE, BLOOD: Lipase: 44 U/L (ref 11–51)

## 2021-11-17 MED ORDER — PREDNISONE 20 MG PO TABS
60.0000 mg | ORAL_TABLET | Freq: Once | ORAL | Status: AC
  Administered 2021-11-17: 60 mg via ORAL
  Filled 2021-11-17: qty 3

## 2021-11-17 MED ORDER — METHOCARBAMOL 750 MG PO TABS: 750.0000 mg | ORAL_TABLET | Freq: Three times a day (TID) | ORAL | 0 refills | Status: DC | PRN

## 2021-11-17 MED ORDER — TRAMADOL HCL 50 MG PO TABS: 50.0000 mg | ORAL_TABLET | Freq: Four times a day (QID) | ORAL | 0 refills | Status: DC | PRN

## 2021-11-17 MED ORDER — PREDNISONE 20 MG PO TABS: ORAL_TABLET | ORAL | 0 refills | Status: DC

## 2021-11-17 MED ORDER — OXYCODONE-ACETAMINOPHEN 5-325 MG PO TABS
1.0000 | ORAL_TABLET | Freq: Once | ORAL | Status: AC
  Administered 2021-11-17: 1 via ORAL
  Filled 2021-11-17: qty 1

## 2021-11-17 NOTE — ED Triage Notes (Signed)
Patient coming from home, complaint of left flank pain for 3 weeks, also complaining of urinary incontinence for approx 1 week. VSS. NAD.

## 2021-11-17 NOTE — Discharge Instructions (Signed)
It was our pleasure to provide your ER care today - we hope that you feel better.  Avoid bending at waist, or heavy lifting > 10 lbs for the next week. Try heat therapy. Try to find position of comfort - for example, lying on back with pillow behind knees, or on your side with pillow between knees.   Take prednisone as prescribed. You may also try ultram as need for pain, and robaxin as need for muscle spasm - no driving when taking.  Follow up with your doctor/back specialist in one week - call office to arrange appointment.  Return to ER if worse, new symptoms, fevers, severe/intractable pain, numbness/weakness, unable to void, incontinent of stool, or other concern.

## 2021-11-17 NOTE — ED Notes (Signed)
E-signature pad unavailable at time of pt discharge. This RN discussed discharge materials with pt and answered all pt questions. Pt stated understanding of discharge material. ? ?

## 2021-11-17 NOTE — ED Notes (Signed)
Pt called his wife to pick up from ED lobby

## 2021-11-17 NOTE — ED Provider Notes (Signed)
[place] [facility] EMERGENCY DEPARTMENT Provider Note   CSN: [id number] Arrival date & time: 11/17/21  1500     History  Chief Complaint  Patient presents with   Flank Pain   Back Pain   left lower back pain    [name] is a 71 y.o. male.  Pt c/o left lower back pain for past three weeks. Hx ddd/prior back surgery. Denies specific recent injury or strain. Dull, constant, pain, worse w certain movements, bending at waist, w occasional radiation down left leg. Denies saddle area or leg numbness. No weakness or falls. No urine retention. No stool incontinence. No fever or chills. No anterior pain - no abd or pelvic pain. No hematuria or dysuria.   The history is provided by the patient and medical records.  Flank Pain Pertinent negatives include no chest pain, no abdominal pain, no headaches and no shortness of breath.      Home Medications Prior to Admission medications   Medication Sig Start Date End Date Taking? Authorizing Provider  amLODipine (NORVASC) 10 MG tablet Take 1 tablet (10 mg total) by mouth daily. 09/17/21   [name], MD  apixaban (ELIQUIS) 5 MG TABS tablet TAKE 1 TABLET BY MOUTH TWICE DAILY. 01/15/21 01/15/22  [name], MD  B Complex Vitamins (VITAMIN B COMPLEX) TABS Take 1 tablet by mouth daily.    [provider]  cyclobenzaprine (FLEXERIL) 10 MG tablet Take 1 tablet (10 mg total) by mouth 3 (three) times daily as needed for muscle spasms. 03/11/21   Earnie Larsson, MD  diclofenac Sodium (VOLTAREN) 1 % GEL Apply 2 g topically 3 (three) times daily as needed (pain). 11/14/21   [name], MD  fluticasone (FLONASE) 50 MCG/ACT nasal spray Place 1 spray into both nostrils daily as needed for allergies or rhinitis.    [provider]  gabapentin (NEURONTIN) 100 MG capsule TAKE 1 CAPSULE (100 MG TOTAL) BY MOUTH 3 (THREE) TIMES DAILY. NEEDED FOR NUMBNESS 04/14/21 04/14/22  [name], MD  glimepiride  (AMARYL) 1 MG tablet Take 1 tablet (1 mg total) by mouth daily with breakfast. 03/10/21   [name], MD  HYDROcodone-acetaminophen (NORCO/VICODIN) 5-325 MG tablet Take 1 tablet by mouth every 4 (four) hours as needed for moderate pain ((score 4 to 6)). 03/11/21   Earnie Larsson, MD  latanoprost (XALATAN) 0.005 % ophthalmic solution Place 1 drop into both eyes nightly. 04/10/21     latanoprost (XALATAN) 0.005 % ophthalmic solution Place 1 drop into both eyes nightly. 06/24/21     Multiple Vitamin (MULTIVITAMIN WITH MINERALS) TABS tablet Take 1 tablet by mouth daily.    [provider]  pravastatin (PRAVACHOL) 10 MG tablet Take 1 tablet (10 mg total) by mouth daily. 05/07/21   [name], MD  prochlorperazine (COMPAZINE) 10 MG tablet Take 1 tablet (10 mg total) by mouth every 6 (six) hours as needed for nausea or vomiting. 05/11/21   Tyler Pita, MD  sennosides-docusate sodium (SENOKOT-S) 8.6-50 MG tablet Take 1 tablet by mouth daily. 03/13/21   [name], MD  tamsulosin (FLOMAX) 0.4 MG CAPS capsule Take 1 capsule (0.4 mg total) by mouth daily after supper. 05/11/21   Tyler Pita, MD      Allergies    Lisinopril    Review of Systems   Review of Systems  Constitutional:  Negative for chills and fever.  HENT:  Negative for sore throat.   Eyes:  Negative for redness.  Respiratory:  Negative for shortness of breath.   Cardiovascular:  Negative for chest pain.  Gastrointestinal:  Negative for abdominal pain, nausea and vomiting.  Genitourinary:  Positive for flank pain.  Musculoskeletal:  Positive for back pain. Negative for neck pain.  Skin:  Negative for rash.  Neurological:  Negative for weakness, numbness and headaches.  Hematological:        On eliquis, no recent abnormal bruising or bleeding.   Psychiatric/Behavioral:  Negative for confusion.    Physical Exam Updated Vital Signs BP 139/89   Pulse (!) 56   Temp 98 F (36.7 C) (Oral)   Resp 16    Ht 1.854 m ('6\' 1"'$ )   Wt 90.7 kg   SpO2 100%   BMI 26.39 kg/m  Physical Exam Vitals and nursing note reviewed.  Constitutional:      Appearance: Normal appearance. He is well-developed.  HENT:     Head: Atraumatic.     Nose: Nose normal.     Mouth/Throat:     Mouth: Mucous membranes are moist.  Eyes:     General: No scleral icterus.    Conjunctiva/sclera: Conjunctivae normal.  Neck:     Trachea: No tracheal deviation.  Cardiovascular:     Rate and Rhythm: Normal rate.     Pulses: Normal pulses.  Pulmonary:     Effort: Pulmonary effort is normal. No accessory muscle usage or respiratory distress.  Abdominal:     General: Bowel sounds are normal. There is no distension.     Palpations: Abdomen is soft. There is no mass.     Tenderness: There is no abdominal tenderness. There is no guarding or rebound.     Hernia: No hernia is present.  Genitourinary:    Comments: No cva tenderness. Musculoskeletal:        General: No swelling.     Cervical back: Normal range of motion and neck supple. No rigidity.     Comments: T/L/S spine non tender, aligned, no step off. No back sts, or skin lesions/rash in area of pain.   Skin:    General: Skin is warm and dry.     Findings: No rash.  Neurological:     Mental Status: He is alert.     Comments: Alert, speech clear. ?straight leg raise on right. Motor intact bil lower ext, stre 5/5. Sens grossly intact. Reflexes symmetric.   Psychiatric:        Mood and Affect: Mood normal.    ED Results / Procedures / Treatments   Labs (all labs ordered are listed, but only abnormal results are displayed) Results for orders placed or performed during the hospital encounter of 11/17/21  CBC with Differential  Result Value Ref Range   WBC 3.4 (L) 4.0 - 10.5 K/uL   RBC 4.31 4.22 - 5.81 MIL/uL   Hemoglobin 12.9 (L) 13.0 - 17.0 g/dL   HCT 39.0 39.0 - 52.0 %   MCV 90.5 80.0 - 100.0 fL   MCH 29.9 26.0 - 34.0 pg   MCHC 33.1 30.0 - 36.0 g/dL   RDW 14.7  11.5 - 15.5 %   Platelets 274 150 - 400 K/uL   nRBC 0.0 0.0 - 0.2 %   Neutrophils Relative % 69 %   Neutro Abs 2.3 1.7 - 7.7 K/uL   Lymphocytes Relative 22 %   Lymphs Abs 0.7 0.7 - 4.0 K/uL   Monocytes Relative 7 %   Monocytes Absolute 0.2 0.1 - 1.0 K/uL   Eosinophils Relative  1 %   Eosinophils Absolute 0.0 0.0 - 0.5 K/uL   Basophils Relative 1 %   Basophils Absolute 0.0 0.0 - 0.1 K/uL   Immature Granulocytes 0 %   Abs Immature Granulocytes 0.01 0.00 - 0.07 K/uL  Comprehensive metabolic panel  Result Value Ref Range   Sodium 136 135 - 145 mmol/L   Potassium 4.2 3.5 - 5.1 mmol/L   Chloride 106 98 - 111 mmol/L   CO2 23 22 - 32 mmol/L   Glucose, Bld 167 (H) 70 - 99 mg/dL   BUN 16 8 - 23 mg/dL   Creatinine, Ser 1.36 (H) 0.61 - 1.24 mg/dL   Calcium 9.0 8.9 - 10.3 mg/dL   Total Protein 7.0 6.5 - 8.1 g/dL   Albumin 3.8 3.5 - 5.0 g/dL   AST 14 (L) 15 - 41 U/L   ALT 12 0 - 44 U/L   Alkaline Phosphatase 77 38 - 126 U/L   Total Bilirubin 0.3 0.3 - 1.2 mg/dL   GFR, Estimated 56 (L) >60 mL/min   Anion gap 7 5 - 15  Lipase, blood  Result Value Ref Range   Lipase 44 11 - 51 U/L     EKG None  Radiology No results found.  Procedures Procedures    Medications Ordered in ED Medications  predniSONE (DELTASONE) tablet 60 mg (has no administration in time range)  oxyCODONE-acetaminophen (PERCOCET/ROXICET) 5-325 MG per tablet 1 tablet (has no administration in time range)    ED Course/ Medical Decision Making/ A&P                           Medical Decision Making Problems Addressed: Acute left-sided low back pain with left-sided sciatica: acute illness or injury with systemic symptoms DDD (degenerative disc disease), lumbar: chronic illness or injury with exacerbation, progression, or side effects of treatment that poses a threat to life or bodily functions Essential hypertension: chronic illness or injury  Amount and/or Complexity of Data Reviewed External Data Reviewed:  radiology and notes. Labs: ordered. Decision-making details documented in ED Course.  Risk Prescription drug management.   Iv ns. Continuous pulse ox and cardiac monitoring. Labs ordered/sent. Imaging ordered.   Reviewed nursing notes and prior charts for additional history. Hx ddd. Prior mri reviewed. External reports reviewed.   Cardiac monitor: sinus rhythm, rate 64.  Labs reviewed/interpreted by me - hct normal.   Exam felt most c/w ddd/sciatica - pain reproduced w change in position, straight leg raise.   States had taken some left over pain meds at home in past couple weeks but now out of any pain medication. Pt has ride, does not have to drive.   Prednisone po. Percocet po.  Pt comfortable appearing. Vitals stable.   Pt appears stable for d/c.   Rec pcp and f/u with his spine specialist.   Return precautions provided.            Final Clinical Impression(s) / ED Diagnoses Final diagnoses:  None    Rx / DC Orders ED Discharge Orders     None         Lajean Saver, MD 11/17/21 1858

## 2021-11-18 ENCOUNTER — Inpatient Hospital Stay: Payer: Medicare HMO | Admitting: Family Medicine

## 2021-11-18 ENCOUNTER — Ambulatory Visit: Payer: Self-pay | Admitting: *Deleted

## 2021-11-18 ENCOUNTER — Other Ambulatory Visit: Payer: Self-pay

## 2021-11-18 ENCOUNTER — Telehealth: Payer: Self-pay | Admitting: Internal Medicine

## 2021-11-18 ENCOUNTER — Encounter: Payer: Self-pay | Admitting: Physician Assistant

## 2021-11-18 ENCOUNTER — Ambulatory Visit: Payer: Medicare HMO | Admitting: Physician Assistant

## 2021-11-18 DIAGNOSIS — M5416 Radiculopathy, lumbar region: Secondary | ICD-10-CM

## 2021-11-18 MED ORDER — HYDROCODONE-ACETAMINOPHEN 5-325 MG PO TABS: 1.0000 | ORAL_TABLET | ORAL | 0 refills | Status: AC | PRN

## 2021-11-18 NOTE — Progress Notes (Signed)
Established Patient Office Visit  Subjective   Patient ID: [name], male    DOB: 08-22-1950  Age: 71 y.o. MRN: [id number]  Chief Complaint  Patient presents with   Back Pain   Virtual Visit via Telephone Note  I connected with [name] on 11/18/21 at  3:40 PM EDT by telephone and verified that I am speaking with the correct person using two identifiers.  Location: Patient: Car Provider: [facility] Medicine Unit    I discussed the limitations, risks, security and privacy concerns of performing an evaluation and management service by telephone and the availability of in person appointments. I also discussed with the patient that there may be a patient responsible charge related to this service. The patient expressed understanding and agreed to proceed.   History of Present Illness:  States he was seen in the emergency department yesterday for back pain.  States that he was given a prescription for tramadol.  States that he took a tramadol that he had from a previous prescription from Dr. [name] last night and states that he had vomiting the entire night.  States that it made him feel "crazy"  States that he has previously taken hydrocodone from his spine specialist in the past with relief and was able to tolerate the medication.  Reports that [facility] and will also have an appointment with his spine specialist that same day.   Observations/Objective: Medical history and current medications reviewed, no physical exam completed   Past Medical History:  Diagnosis Date   Abscess of anal and rectal regions 07/15/2007   Qualifier: Diagnosis of  By: Amil Amen MD, Elizabeth     Atrial fibrillation [facility])    Diabetes mellitus without complication ([place]) Dx 0109   FISTULA, ANAL 01/27/2007   Qualifier: Diagnosis of  By: Amil Amen MD, Elizabeth     Hypertension Dx 2008   Prostate cancer  [facility])    Stroke [facility])    Social History   Socioeconomic History   Marital status: Married    Spouse name: Not on file   Number of children: 5   Years of education: Not on file   Highest education level: Not on file  Occupational History   Occupation: Caregiver    Comment: retired  Tobacco Use   Smoking status: Every Day    Packs/day: 0.25    Years: 10.00    Pack years: 2.50    Types: Cigarettes   Smokeless tobacco: Never   Tobacco comments:    Quit 05/15/14  Vaping Use   Vaping Use: Never used  Substance and Sexual Activity   Alcohol use: No    Alcohol/week: 0.0 standard drinks   Drug use: No   Sexual activity: Not Currently  Other Topics Concern   Not on file  Social History Narrative   Lives with his wife   5 children, grown.   7 grandchildren.    Social Determinants of Health   Financial Resource Strain: Not on file  Food Insecurity: Not on file  Transportation Needs: Not on file  Physical Activity: Not on file  Stress: Not on file  Social Connections: Not on file  Intimate Partner Violence: Not on file   Family History  Problem Relation Age of Onset   Diabetes Mother    Breast cancer Mother    Diabetes Father    Breast cancer Sister    Prostate cancer Neg Hx  Colon cancer Neg Hx    Pancreatic cancer Neg Hx    Allergies  Allergen Reactions   Lisinopril Swelling    Throat swelling      Review of Systems  Constitutional: Negative.   HENT: Negative.    Eyes: Negative.   Respiratory:  Negative for shortness of breath.   Cardiovascular:  Negative for chest pain.  Gastrointestinal: Negative.   Genitourinary: Negative.   Musculoskeletal:  Positive for back pain.  Skin: Negative.   Neurological: Negative.   Endo/Heme/Allergies: Negative.   Psychiatric/Behavioral: Negative.          Assessment & Plan:   Problem List Items Addressed This Visit       Nervous and Auditory   Lumbar radiculopathy - Primary   Relevant Medications    HYDROcodone-acetaminophen (NORCO/VICODIN) 5-325 MG tablet    Assessment and Plan:   1. Lumbar radiculopathy Check of [place] controlled substance registry shows no recent fills.  Patient did not fill tramadol prescription from the emergency department.  Refill Norco for 3 days.  Patient education given on supportive care.  Red flags for prompt reevaluation. - HYDROcodone-acetaminophen (NORCO/VICODIN) 5-325 MG tablet; Take 1 tablet by mouth every 4 (four) hours as needed for up to 3 days for moderate pain ((score 4 to 6)).  Dispense: 10 tablet; Refill: 0   Follow Up Instructions:    I discussed the assessment and treatment plan with the patient. The patient was provided an opportunity to ask questions and all were answered. The patient agreed with the plan and demonstrated an understanding of the instructions.   The patient was advised to call back or seek an in-person evaluation if the symptoms worsen or if the condition fails to improve as anticipated.  I provided 12 minutes of non-face-to-face time during this encounter.   Return if symptoms worsen or fail to improve.    Loraine Grip Mayers, PA-C

## 2021-11-18 NOTE — Telephone Encounter (Signed)
Pt called to check status of request.

## 2021-11-18 NOTE — Patient Instructions (Signed)
Please let us know if using else we can do for you.  Kennieth Rad, PA-C Physician Assistant [facility] [email or web address]   Managing Pain Without Opioids Opioids are strong medicines used to treat moderate to severe pain. For some people, especially those who have long-term (chronic) pain, opioids may not be the best choice for pain management due to: Side effects like nausea, constipation, and sleepiness. The risk of addiction (opioid use disorder). The longer you take opioids, the greater your risk of addiction. Pain that lasts for more than 3 months is called chronic pain. Managing chronic pain usually requires more than one approach and is often provided by a team of health care providers working together (multidisciplinary approach). Pain management may be done at a pain management center or pain clinic. How to manage pain without the use of opioids Use non-opioid medicines Non-opioid medicines for pain may include: Over-the-counter or prescription non-steroidal anti-inflammatory drugs (NSAIDs). These may be the first medicines used for pain. They work well for muscle and bone pain, and they reduce swelling. Acetaminophen. This over-the-counter medicine may work well for milder pain but not swelling. Antidepressants. These may be used to treat chronic pain. A certain type of antidepressant (tricyclics) is often used. These medicines are given in lower doses for pain than when used for depression. Anticonvulsants. These are usually used to treat seizures but may also reduce nerve (neuropathic) pain. Muscle relaxants. These relieve pain caused by sudden muscle tightening (spasms). You may also use a pain medicine that is applied to the skin as a patch, cream, or gel (topical analgesic), such as a numbing medicine. These may cause fewer side effects than medicines taken by mouth. Do certain therapies as directed Some therapies can  help with pain management. They include: Physical therapy. You will do exercises to gain strength and flexibility. A physical therapist may teach you exercises to move and stretch parts of your body that are weak, stiff, or painful. You can learn these exercises at physical therapy visits and practice them at home. Physical therapy may also involve: Massage. Heat wraps or applying heat or cold to affected areas. Electrical signals that interrupt pain signals (transcutaneous electrical nerve stimulation, TENS). Weak lasers that reduce pain and swelling (low-level laser therapy). Signals from your body that help you learn to regulate pain (biofeedback). Occupational therapy. This helps you to learn ways to function at home and work with less pain. Recreational therapy. This involves trying new activities or hobbies, such as a physical activity or drawing. Mental health therapy, including: Cognitive behavioral therapy (CBT). This helps you learn coping skills for dealing with pain. Acceptance and commitment therapy (ACT) to change the way you think and react to pain. Relaxation therapies, including muscle relaxation exercises and mindfulness-based stress reduction. Pain management counseling. This may be individual, family, or group counseling.  Receive medical treatments Medical treatments for pain management include: Nerve block injections. These may include a pain blocker and anti-inflammatory medicines. You may have injections: Near the spine to relieve chronic back or neck pain. Into joints to relieve back or joint pain. Into nerve areas that supply a painful area to relieve body pain. Into muscles (trigger point injections) to relieve some painful muscle conditions. A medical device placed near your spine to help block pain signals and relieve nerve pain or chronic back pain (spinal cord stimulation device). Acupuncture. Follow these instructions at home Medicines Take over-the-counter  and prescription medicines only as told by your health care provider.  If you are taking pain medicine, ask your health care providers about possible side effects to watch out for. Do not drive or use heavy machinery while taking prescription opioid pain medicine. Lifestyle  Do not use drugs or alcohol to reduce pain. If you drink alcohol, limit how much you have to: 0-1 drink a day for women who are not pregnant. 0-2 drinks a day for men. Know how much alcohol is in a drink. In the [place] one drink equals one 12 oz bottle of beer (355 mL), one 5 oz glass of wine (148 mL), or one 1 oz glass of hard liquor (44 mL). Do not use any products that contain nicotine or tobacco. These products include cigarettes, chewing tobacco, and vaping devices, such as e-cigarettes. If you need help quitting, ask your health care provider. Eat a healthy diet and maintain a healthy weight. Poor diet and excess weight may make pain worse. Eat foods that are high in fiber. These include fresh fruits and vegetables, whole grains, and beans. Limit foods that are high in fat and processed sugars, such as fried and sweet foods. Exercise regularly. Exercise lowers stress and may help relieve pain. Ask your health care provider what activities and exercises are safe for you. If your health care provider approves, join an exercise class that combines movement and stress reduction. Examples include yoga and tai chi. Get enough sleep. Lack of sleep may make pain worse. Lower stress as much as possible. Practice stress reduction techniques as told by your therapist. General instructions Work with all your pain management providers to find the treatments that work best for you. You are an important member of your pain management team. There are many things you can do to reduce pain on your own. Consider joining an online or in-person support group for people who have chronic pain. Keep all follow-up visits. This is  important. Where to find more information You can find more information about managing pain without opioids from: American Academy of Pain Medicine: painmed.[place] for Chronic Pain: instituteforchronicpain.org American Chronic Pain Association: theacpa.org Contact a health care provider if: You have side effects from pain medicine. Your pain gets worse or does not get better with treatments or home therapy. You are struggling with anxiety or depression. Summary Many types of pain can be managed without opioids. Chronic pain may respond better to pain management without opioids. Pain is best managed when you and a team of health care providers work together. Pain management without opioids may include non-opioid medicines, medical treatments, physical therapy, mental health therapy, and lifestyle changes. Tell your health care providers if your pain gets worse or is not being managed well enough. This information is not intended to replace advice given to you by your health care provider. Make sure you discuss any questions you have with your health care provider. Document Revised: 09/18/2020 Document Reviewed: 09/18/2020 Elsevier Patient Education  [place].

## 2021-11-18 NOTE — Telephone Encounter (Signed)
Pls advise for referral   Does patient needs an OV for referral or ok to send from EDP notes?

## 2021-11-18 NOTE — Telephone Encounter (Signed)
Copied from [place] [id number]. Topic: Referral - Request for Referral >> Nov 18, 2021 10:41 AM Alanda Slim E wrote: Has patient seen PCP for this complaint? Yes  *If NO, is insurance requiring patient see PCP for this issue before PCP can refer them? Referral for which specialty: Neurologist  Preferred provider/office: Dr. Trenton Gammon / [facility]  Reason for referral: acute left side low back pain with left side sciatica/ from ER doctor/ pt needs referral asap

## 2021-11-18 NOTE — Telephone Encounter (Signed)
  Chief Complaint: requesting referral to Dr. Trenton Gammon Symptoms: severe back pain, last seen in ED 11/17/21. Incontinent B/B. Tramadol causing vomiting and "feels crazy" Frequency: worsening since yesterday  Pertinent Negatives: Patient denies na  Disposition: '[x]'$ ED /'[]'$ Urgent Care (no appt availability in office) / '[]'$ Appointment(In office/virtual)/ '[]'$  [place] [facility]/ '[]'$ Home Care/ '[x]'$ Refused Recommended Disposition /'[]'$ [place] [facility]/ '[]'$  Follow-up with PCP Additional Notes:   Requesting a call back. Requesting referral and medication for back pain. Requesting medication to go to [facility]. Refused to go to ED. Reports they recommended to see PCP.    Reason for Disposition  [1] SEVERE back pain (e.g., excruciating) AND [2] sudden onset AND [3] age > 60 years  Answer Assessment - Initial Assessment Questions 1. ONSET: "When did the pain begin?"      1 month ago  2. LOCATION: "Where does it hurt?" (upper, mid or lower back)     Back  3. SEVERITY: "How bad is the pain?"  (e.g., Scale 1-10; mild, moderate, or severe)   - MILD (1-3): doesn't interfere with normal activities    - MODERATE (4-7): interferes with normal activities or awakens from sleep    - SEVERE (8-10): excruciating pain, unable to do any normal activities      Severe - 12 4. PATTERN: "Is the pain constant?" (e.g., yes, no; constant, intermittent)      Yes  5. RADIATION: "Does the pain shoot into your legs or elsewhere?"     Yes  6. CAUSE:  "What do you think is causing the back pain?"      See ED encounter  7. BACK OVERUSE:  "Any recent lifting of heavy objects, strenuous work or exercise?"     na 8. MEDICATIONS: "What have you taken so far for the pain?" (e.g., nothing, acetaminophen, NSAIDS)     na 9. NEUROLOGIC SYMPTOMS: "Do you have any weakness, numbness, or problems with bowel/bladder control?"     Issues with B/B incontinence  10. OTHER SYMPTOMS: "Do you have any other symptoms?" (e.g., fever,  abdominal pain, burning with urination, blood in urine)       Pain medication causes vomiting and "feeling crazy" 11. PREGNANCY: "Is there any chance you are pregnant?" (e.g., yes, no; LMP)       na  Protocols used: Back Pain-A-AH

## 2021-11-18 NOTE — Telephone Encounter (Signed)
Patient scheduled a hospital follow up with Freeman Caldron, [place] on 11/20/2021 to discuss diabetic shoes and have his labs check since being seen at the hospital. Please discuss neurology referral placed on 11/18/2021 by PCP.

## 2021-11-18 NOTE — Telephone Encounter (Signed)
Patient has appointment scheduled for 11/20/2021

## 2021-11-19 NOTE — Telephone Encounter (Signed)
Patient aware referral is being worked on at priority by Teaching laboratory technician. Aware that office would call him for an appt.    He states he spoke to Dr. Orvis Brill office yesterday and may have an appt.

## 2021-11-20 ENCOUNTER — Encounter: Payer: Self-pay | Admitting: Physician Assistant

## 2021-11-20 ENCOUNTER — Other Ambulatory Visit: Payer: Self-pay

## 2021-11-20 ENCOUNTER — Ambulatory Visit: Payer: Medicare HMO | Attending: Physician Assistant | Admitting: Physician Assistant

## 2021-11-20 VITALS — BP 132/82 | HR 95 | Wt 193.0 lb

## 2021-11-20 DIAGNOSIS — I10 Essential (primary) hypertension: Secondary | ICD-10-CM

## 2021-11-20 DIAGNOSIS — B351 Tinea unguium: Secondary | ICD-10-CM

## 2021-11-20 DIAGNOSIS — M79674 Pain in right toe(s): Secondary | ICD-10-CM

## 2021-11-20 DIAGNOSIS — E119 Type 2 diabetes mellitus without complications: Secondary | ICD-10-CM | POA: Diagnosis not present

## 2021-11-20 DIAGNOSIS — M5416 Radiculopathy, lumbar region: Secondary | ICD-10-CM

## 2021-11-20 DIAGNOSIS — Z6826 Body mass index (BMI) 26.0-26.9, adult: Secondary | ICD-10-CM | POA: Diagnosis not present

## 2021-11-20 DIAGNOSIS — E1149 Type 2 diabetes mellitus with other diabetic neurological complication: Secondary | ICD-10-CM

## 2021-11-20 DIAGNOSIS — M545 Low back pain, unspecified: Secondary | ICD-10-CM | POA: Diagnosis not present

## 2021-11-20 DIAGNOSIS — I69354 Hemiplegia and hemiparesis following cerebral infarction affecting left non-dominant side: Secondary | ICD-10-CM

## 2021-11-20 DIAGNOSIS — R200 Anesthesia of skin: Secondary | ICD-10-CM | POA: Diagnosis not present

## 2021-11-20 DIAGNOSIS — E1142 Type 2 diabetes mellitus with diabetic polyneuropathy: Secondary | ICD-10-CM

## 2021-11-20 DIAGNOSIS — L84 Corns and callosities: Secondary | ICD-10-CM

## 2021-11-20 DIAGNOSIS — M79675 Pain in left toe(s): Secondary | ICD-10-CM

## 2021-11-20 DIAGNOSIS — F419 Anxiety disorder, unspecified: Secondary | ICD-10-CM

## 2021-11-20 LAB — POCT GLYCOSYLATED HEMOGLOBIN (HGB A1C): HbA1c, POC (controlled diabetic range): 6.7 % (ref 0.0–7.0)

## 2021-11-20 LAB — GLUCOSE, POCT (MANUAL RESULT ENTRY): POC Glucose: 230 mg/dl — AB (ref 70–99)

## 2021-11-20 MED ORDER — APIXABAN 5 MG PO TABS
ORAL_TABLET | Freq: Two times a day (BID) | ORAL | 3 refills | Status: DC
  Filled 2021-11-20: qty 60, 30d supply, fill #0
  Filled 2022-04-02: qty 60, 30d supply, fill #1
  Filled 2022-08-24: qty 60, 30d supply, fill #2

## 2021-11-20 MED ORDER — GLIMEPIRIDE 1 MG PO TABS
1.0000 mg | ORAL_TABLET | Freq: Every day | ORAL | 3 refills | Status: DC
  Filled 2021-11-20: qty 30, 30d supply, fill #0

## 2021-11-20 MED ORDER — GABAPENTIN 100 MG PO CAPS
ORAL_CAPSULE | ORAL | 1 refills | Status: DC
  Filled 2021-11-20: qty 90, 30d supply, fill #0

## 2021-11-20 MED ORDER — AMLODIPINE BESYLATE 10 MG PO TABS
10.0000 mg | ORAL_TABLET | Freq: Every day | ORAL | 1 refills | Status: DC
  Filled 2021-11-20: qty 90, 90d supply, fill #0
  Filled 2022-05-22: qty 90, 90d supply, fill #1

## 2021-11-20 MED ORDER — PRAVASTATIN SODIUM 10 MG PO TABS
10.0000 mg | ORAL_TABLET | Freq: Every day | ORAL | 1 refills | Status: DC
  Filled 2021-11-20: qty 30, 30d supply, fill #0

## 2021-11-20 MED ORDER — HYDROXYZINE PAMOATE 25 MG PO CAPS
25.0000 mg | ORAL_CAPSULE | Freq: Two times a day (BID) | ORAL | 3 refills | Status: DC | PRN
  Filled 2021-11-20: qty 60, 30d supply, fill #0
  Filled 2022-03-05: qty 60, 30d supply, fill #1

## 2021-11-20 NOTE — Progress Notes (Addendum)
Patient ID: [name], male   DOB: 1950-11-14, 71 y.o.   MRN: [id number]     [name], is a 71 y.o. male  [id number]:[id number]  [id number]:[id number]  DOB - Jun 20, 1951  Chief Complaint  Patient presents with   Diabetes   Hypertension   Medication Refill       Subjective:   [name] is a 71 y.o. male here today for a follow up visit for med RF and order for diabetic shoes.  He is doing well.  Fasting blood sugars under 120.  He is doing well other than sciatica.  He needs a diabetic foot exam.    No problems updated.  ALLERGIES: Allergies  Allergen Reactions   Lisinopril Swelling    Throat swelling    PAST MEDICAL HISTORY: Past Medical History:  Diagnosis Date   Abscess of anal and rectal regions 07/15/2007   Qualifier: Diagnosis of  By: Scott Amen MD, [name]     Atrial fibrillation [name])    Diabetes mellitus without complication [name]) Dx 5885   FISTULA, ANAL 01/27/2007   Qualifier: Diagnosis of  By: Scott Amen MD, [name]     Hypertension Dx 2008   Prostate cancer [name])    Stroke [name])     MEDICATIONS AT HOME: Prior to Admission medications   Medication Sig Start Date End Date Taking? Authorizing Provider  B Complex Vitamins (VITAMIN B COMPLEX) TABS Take 1 tablet by mouth daily.   Yes [provider]  cyclobenzaprine (FLEXERIL) 10 MG tablet Take 1 tablet (10 mg total) by mouth 3 (three) times daily as needed for muscle spasms. 03/11/21  Yes [name], Scott Mussel, MD  diclofenac Sodium (VOLTAREN) 1 % GEL Apply 2 g topically 3 (three) times daily as needed (pain). 11/14/21  Yes Scott Pier, MD  fluticasone (FLONASE) 50 MCG/ACT nasal spray Place 1 spray into both nostrils daily as needed for allergies or rhinitis.   Yes [provider]  HYDROcodone-acetaminophen (NORCO/VICODIN) 5-325 MG tablet Take 1 tablet by mouth every 4 (four) hours as needed for up to 3 days for moderate pain ((score 4 to 6)). 11/18/21 11/21/21 Yes [name], Scott S, PA-C   hydrOXYzine (VISTARIL) 25 MG capsule Take 1 capsule (25 mg total) by mouth 2 (two) times daily as needed. 11/20/21  Yes [name], Scott Bucy, PA-C  latanoprost (XALATAN) 0.005 % ophthalmic solution Place 1 drop into both eyes nightly. 04/10/21  Yes   latanoprost (XALATAN) 0.005 % ophthalmic solution Place 1 drop into both eyes nightly. 06/24/21  Yes   methocarbamol (ROBAXIN) 750 MG tablet Take 1 tablet (750 mg total) by mouth 3 (three) times daily as needed (muscle spasm/pain). 11/17/21  Yes Scott Saver, MD  Multiple Vitamin (MULTIVITAMIN WITH MINERALS) TABS tablet Take 1 tablet by mouth daily.   Yes [provider]  predniSONE (DELTASONE) 20 MG tablet 3 po once a day for 2 days, then 2 po once a day for 3 days, then 1 po once a day for 3 days 11/18/21  Yes Scott Saver, MD  prochlorperazine (COMPAZINE) 10 MG tablet Take 1 tablet (10 mg total) by mouth every 6 (six) hours as needed for nausea or vomiting. 05/11/21  Yes Scott Pita, MD  sennosides-docusate sodium (SENOKOT-Travis) 8.6-50 MG tablet Take 1 tablet by mouth daily. 03/13/21  Yes Scott Pier, MD  tamsulosin (FLOMAX) 0.4 MG CAPS capsule Take 1 capsule (0.4 mg total) by mouth daily after supper. 05/11/21  Yes Scott Pita, MD  amLODipine (NORVASC) 10 MG tablet Take 1  tablet (10 mg total) by mouth daily. 11/20/21   Scott Donovan, PA-C  apixaban (ELIQUIS) 5 MG TABS tablet TAKE 1 TABLET BY MOUTH TWICE DAILY. 11/20/21 11/20/22  Scott Donovan, PA-C  gabapentin (NEURONTIN) 100 MG capsule TAKE 1 CAPSULE (100 MG TOTAL) BY MOUTH 3 (THREE) TIMES DAILY. NEEDED FOR NUMBNESS 11/20/21 11/20/22  Scott Donovan, PA-C  glimepiride (AMARYL) 1 MG tablet Take 1 tablet (1 mg total) by mouth daily with breakfast. 11/20/21   Scott Donovan, PA-C  pravastatin (PRAVACHOL) 10 MG tablet Take 1 tablet (10 mg total) by mouth daily. 11/20/21   [name], Scott Bucy, PA-C    ROS: Neg HEENT Neg resp Neg cardiac Neg GI Neg GU Neg psych Neg  neuro  Objective:   Vitals:   11/20/21 1543 11/20/21 1545  BP: (!) 145/87 132/82  Pulse: 95   SpO2: 95%   Weight: 193 lb (87.5 kg)    Exam General appearance : Awake, alert, not in any distress. Speech Clear. Not toxic looking HEENT: Atraumatic and Normocephalic Neck: Supple, no JVD. No cervical lymphadenopathy.  Chest: Good air entry bilaterally, CTAB.  No rales/rhonchi/wheezing CVS: S1 S2 regular, no murmurs.  B foot exam-R foot with medial callous at first MTP and callous plantar surface at base of 5th metatarsal.  No ulcerations.  Monofilament test reveals diminished sensation plantar surface of heel and forefoot(this is B).  Pulses = intact B.  Delayed capillary RF B feet.  Arches appear normal B.  L foot with callouses at same locations as R foot.  Both feet have nails that have onychomycosis.  Skin is warm dry and intact B Extremities: B/L Lower Ext shows no edema, both legs are warm to touch Neurology: Awake alert, and oriented X 3, CN II-XII intact, Non focal Skin: No Rash  Data Review Lab Results  Component Value Date   HGBA1C 7.6 (H) 03/03/2021   HGBA1C 7.1 (A) 10/28/2020   HGBA1C 7.2 (H) 05/10/2020    Assessment & Plan   1. Type 2 diabetes mellitus with other neurologic complication, without long-term current use of insulin (HCC) A1C today=6.7=improved control.  Continue to work on diabetic diet and continue current medication regimen - Glucose (CBG) - HgB A1c - glimepiride (AMARYL) 1 MG tablet; Take 1 tablet (1 mg total) by mouth daily with breakfast.  Dispense: 30 tablet; Refill: 3  2. Numbness in left leg - gabapentin (NEURONTIN) 100 MG capsule; TAKE 1 CAPSULE (100 MG TOTAL) BY MOUTH 3 (THREE) TIMES DAILY. NEEDED FOR NUMBNESS  Dispense: 90 capsule; Refill: 1  3. Lumbar radiculopathy - gabapentin (NEURONTIN) 100 MG capsule; TAKE 1 CAPSULE (100 MG TOTAL) BY MOUTH 3 (THREE) TIMES DAILY. NEEDED FOR NUMBNESS  Dispense: 90 capsule; Refill: 1  4. Essential  hypertension Near goal at BP recheck.  At home Bp runs ~120/70-80 - amLODipine (NORVASC) 10 MG tablet; Take 1 tablet (10 mg total) by mouth daily.  Dispense: 90 tablet; Refill: 1  5. Anxiety - hydrOXYzine (VISTARIL) 25 MG capsule; Take 1 capsule (25 mg total) by mouth 2 (two) times daily as needed.  Dispense: 30 capsule; Refill: 3  6. Encounter for diabetic foot exam ([place]) Multiple callous without ulceration, diminished sensation with monofilament testing/pin prick, onychomychosis, needs diabetic shoes.  Form faxed and scanned to chart  7. Hemiparesis affecting left side as late effect of stroke (HCC) - pravastatin (PRAVACHOL) 10 MG tablet; Take 1 tablet (10 mg total) by mouth daily.  Dispense: 90 tablet; Refill: 1 - apixaban (  ELIQUIS) 5 MG TABS tablet; TAKE 1 TABLET BY MOUTH TWICE DAILY.  Dispense: 60 tablet; Refill: 3  8. Pre-ulcerative corn or callous See #6  9. Pain due to onychomycosis of toenails of both feet See #6  10. Diabetic peripheral neuropathy associated with type 2 diabetes mellitus ([place]) See #6    Return in about 3 months (around 02/20/2022) for with PCP for chronic conditions.  The patient was given clear instructions to go to ER or return to medical Travis if symptoms don't improve, worsen or new problems develop. The patient verbalized understanding. The patient was told to call to get lab results if they haven't heard anything in the next week.      Freeman Caldron, PA-C [facility], [place]   11/20/2021, 4:39 PM   I have reviewed and agree with the findings and assessment and plan of PA Gweneth Dimitri, MD

## 2021-11-21 ENCOUNTER — Other Ambulatory Visit: Payer: Self-pay

## 2021-11-22 ENCOUNTER — Other Ambulatory Visit: Payer: Self-pay | Admitting: Student

## 2021-11-22 ENCOUNTER — Other Ambulatory Visit (HOSPITAL_COMMUNITY): Payer: Self-pay | Admitting: Student

## 2021-11-22 DIAGNOSIS — M5416 Radiculopathy, lumbar region: Secondary | ICD-10-CM

## 2021-11-24 ENCOUNTER — Other Ambulatory Visit: Payer: Self-pay

## 2021-11-26 ENCOUNTER — Ambulatory Visit: Payer: Medicare HMO | Admitting: Physician Assistant

## 2021-12-02 ENCOUNTER — Ambulatory Visit (INDEPENDENT_AMBULATORY_CARE_PROVIDER_SITE_OTHER): Payer: Medicare HMO

## 2021-12-02 ENCOUNTER — Ambulatory Visit (INDEPENDENT_AMBULATORY_CARE_PROVIDER_SITE_OTHER): Payer: Medicare HMO | Admitting: Podiatry

## 2021-12-02 ENCOUNTER — Encounter: Payer: Self-pay | Admitting: Podiatry

## 2021-12-02 DIAGNOSIS — M79675 Pain in left toe(s): Secondary | ICD-10-CM | POA: Diagnosis not present

## 2021-12-02 DIAGNOSIS — D689 Coagulation defect, unspecified: Secondary | ICD-10-CM | POA: Diagnosis not present

## 2021-12-02 DIAGNOSIS — M216X2 Other acquired deformities of left foot: Secondary | ICD-10-CM | POA: Diagnosis not present

## 2021-12-02 DIAGNOSIS — E1149 Type 2 diabetes mellitus with other diabetic neurological complication: Secondary | ICD-10-CM

## 2021-12-02 DIAGNOSIS — M216X1 Other acquired deformities of right foot: Secondary | ICD-10-CM | POA: Diagnosis not present

## 2021-12-02 DIAGNOSIS — B351 Tinea unguium: Secondary | ICD-10-CM | POA: Diagnosis not present

## 2021-12-02 DIAGNOSIS — M79674 Pain in right toe(s): Secondary | ICD-10-CM

## 2021-12-02 NOTE — Progress Notes (Signed)
SITUATION Reason for Visit: Fitting of Diabetic Shoes & Insoles Patient / Caregiver Report:  Patient is satisfied with fit and function of shoes and insoles.  OBJECTIVE DATA: Patient History / Diagnosis:     ICD-10-CM   1. Type 2 diabetes mellitus with other neurologic complication, without long-term current use of insulin (HCC)  E11.49     2. Plantar flexed metatarsal, left  M21.6X2     3. Plantar flexed metatarsal, right  M21.6X1       Change in Status:   None  ACTIONS PERFORMED: In-Person Delivery, patient was fit with: - 1x pair [id number] PDAC approved prefabricated Diabetic Shoes: X801M 14X - 3x pair [id number] PDAC approved vacuum formed custom diabetic insoles; [employer]: [id number]  Shoes and insoles were verified for structural integrity and safety. Patient wore shoes and insoles in office. Skin was inspected and free of areas of concern after wearing shoes and inserts. Shoes and inserts fit properly. Patient / Caregiver provided with ferbal instruction and demonstration regarding donning, doffing, wear, care, proper fit, function, purpose, cleaning, and use of shoes and insoles ' and in all related precautions and risks and benefits regarding shoes and insoles. Patient / Caregiver was instructed to wear properly fitting socks with shoes at all times. Patient was also provided with verbal instruction regarding how to report any failures or malfunctions of shoes or inserts, and necessary follow up care. Patient / Caregiver was also instructed to contact physician regarding change in status that may affect function of shoes and inserts.   Patient / Caregiver verbalized undersatnding of instruction provided. Patient / Caregiver demonstrated independence with proper donning and doffing of shoes and inserts.  PLAN Patient to follow with treating physician as recommended. Plan of care was discussed with and agreed upon by patient and/or caregiver. All questions were answered and concerns  addressed.

## 2021-12-02 NOTE — Progress Notes (Signed)
This patient returns to my office for at risk foot care.  This patient requires this care by a professional since this patient will be at risk due to having diabetes.  This patient is unable to cut nails himself since the patient cannot reach his nails.These nails are painful walking and wearing shoes.  This patient presents for at risk foot care today.  General Appearance  Alert, conversant and in no acute stress.  Vascular  Dorsalis pedis and posterior tibial  pulses are palpable  bilaterally.  Capillary return is within normal limits  bilaterally. Temperature is within normal limits  bilaterally.  Neurologic  Senn-Weinstein monofilament wire test diminished bilaterally. Muscle power within normal limits bilaterally.  Nails Thick disfigured discolored nails with subungual debris  from hallux to fifth toes bilaterally. No evidence of bacterial infection or drainage bilaterally.  Orthopedic  No limitations of motion  feet .  No crepitus or effusions noted.  No bony pathology or digital deformities noted.  Plantar flexed fifth metatarsal  B/L.  Skin  normotropic skin with no porokeratosis noted bilaterally.  No signs of infections or ulcers noted.   Asymptomatic callus sub 5th  B/l.  Onychomycosis  Pain in right toes  Pain in left toes  Porokeratosis sub 5th met  B/L asymptomatic.  Consent was obtained for treatment procedures.   Mechanical debridement of nails 1-5  bilaterally performed with a nail nipper.  Filed with dremel without incident.    Return office visit   3 months                  Told patient to return for periodic foot care and evaluation due to potential at risk complications.   Gardiner Barefoot DPM

## 2021-12-03 ENCOUNTER — Encounter (HOSPITAL_COMMUNITY): Payer: Self-pay

## 2021-12-03 ENCOUNTER — Other Ambulatory Visit (HOSPITAL_COMMUNITY): Payer: Medicare HMO

## 2021-12-04 ENCOUNTER — Ambulatory Visit (HOSPITAL_COMMUNITY)
Admission: RE | Admit: 2021-12-04 | Discharge: 2021-12-04 | Disposition: A | Discharge status: Home / Self Care | Payer: Medicare HMO | Source: Ambulatory Visit | Attending: Student | Admitting: Student

## 2021-12-04 DIAGNOSIS — M5416 Radiculopathy, lumbar region: Secondary | ICD-10-CM | POA: Diagnosis not present

## 2021-12-04 DIAGNOSIS — M48061 Spinal stenosis, lumbar region without neurogenic claudication: Secondary | ICD-10-CM | POA: Diagnosis not present

## 2021-12-04 MED ORDER — GADOBUTROL 1 MMOL/ML IV SOLN
9.0000 mL | Freq: Once | INTRAVENOUS | Status: AC | PRN
Start: 2021-12-04 — End: 2021-12-04
  Administered 2021-12-04: 9 mL via INTRAVENOUS

## 2021-12-11 DIAGNOSIS — M5126 Other intervertebral disc displacement, lumbar region: Secondary | ICD-10-CM | POA: Diagnosis not present

## 2021-12-12 ENCOUNTER — Other Ambulatory Visit: Payer: Self-pay

## 2021-12-12 ENCOUNTER — Other Ambulatory Visit: Payer: Self-pay | Admitting: Neurosurgery

## 2021-12-19 ENCOUNTER — Telehealth: Payer: Self-pay

## 2021-12-19 NOTE — Telephone Encounter (Signed)
Pt has surgery on 12/29/21 no opening on your schedule for him to be seen before 12/29/2021

## 2021-12-25 ENCOUNTER — Inpatient Hospital Stay (HOSPITAL_COMMUNITY): Admission: RE | Admit: 2021-12-25 | Payer: Medicare HMO | Source: Ambulatory Visit

## 2021-12-25 ENCOUNTER — Encounter (HOSPITAL_COMMUNITY): Payer: Self-pay

## 2021-12-25 HISTORY — DX: Other pneumothorax: J93.83

## 2021-12-26 ENCOUNTER — Encounter (HOSPITAL_COMMUNITY): Payer: Self-pay | Admitting: Neurosurgery

## 2021-12-26 ENCOUNTER — Other Ambulatory Visit: Payer: Self-pay

## 2021-12-26 ENCOUNTER — Ambulatory Visit: Payer: Medicare HMO | Attending: Internal Medicine | Admitting: Internal Medicine

## 2021-12-26 ENCOUNTER — Encounter: Payer: Self-pay | Admitting: Internal Medicine

## 2021-12-26 VITALS — BP 150/84 | HR 95 | Temp 98.6°F | Ht 73.0 in | Wt 190.8 lb

## 2021-12-26 DIAGNOSIS — I480 Paroxysmal atrial fibrillation: Secondary | ICD-10-CM | POA: Diagnosis not present

## 2021-12-26 DIAGNOSIS — I10 Essential (primary) hypertension: Secondary | ICD-10-CM

## 2021-12-26 DIAGNOSIS — M5416 Radiculopathy, lumbar region: Secondary | ICD-10-CM

## 2021-12-26 DIAGNOSIS — Z01818 Encounter for other preprocedural examination: Secondary | ICD-10-CM

## 2021-12-26 DIAGNOSIS — E1149 Type 2 diabetes mellitus with other diabetic neurological complication: Secondary | ICD-10-CM | POA: Diagnosis not present

## 2021-12-26 NOTE — Progress Notes (Unsigned)
Patient ID: QUILL GRINDER, male    DOB: 1950/11/09  MRN: [id number]  CC: Pre-op Exam   Subjective: [name] is a 71 y.o. 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 cocaine abuse in remission since 2005, LT pneumothorax.   Patient presents today for preoperative evaluation. He will be having surgery on the lumbar spine again on 12/29/2021 by Dr. Trenton Gammon.  Of note, patient had surgery on the lumbar spine 02/2021.  He had recent flare of back pain this time with left-sided radicular symptoms.  Recent MRI showed unchanged severe left neural foraminal stenosis at L4-5 and L5-S1. -Patient reports no issues with anesthesia in the past. -He is unable to walk more than 30 feet without having to stop due to his back issues. -Denies any chest pains or shortness of breath at rest or exertion. -He had seen Dr. Debara Pickett 02/2021 prior to lumbar surgery.  His assessment was that patient probably could not do 4 metabolic equivalents of activity because of his back issues.  Stress testing offered but patient declined.  Patient was assessed to be acceptable risks for his surgery.  Today, he still does not wish to have any stress testing done and would like to proceed with having surgery.  Blood pressure elevated today.  Patient states he was in a rush to get out of the house to come to this appointment and forgot to take his amlodipine.  He plans to take it once he returns home.  Checks blood pressure every 3 days.  Reading 2 days ago was 111/80.  Blood pressure on visit last month with PA was 132/80.  DM: Most recent A1c was 6.7.  He is on metformin 500 mg daily and Amaryl 1 mg.  Checks his blood sugars every other day and reports that numbers have been in the low 100s.  A-fib: On Eliquis.  Denies any bruising or bleeding.  He has  not had any palpitations.   Patient Active Problem List   Diagnosis Date Noted   Lumbar radiculopathy 03/11/2021   Plantar flexed metatarsal, left 07/30/2020   Plantar flexed metatarsal, right 07/30/2020   Tobacco dependence 05/27/2020   History of pneumothorax 05/27/2020   Pneumothorax 05/10/2020   Blood clotting disorder ([place]) 04/23/2020   Stressful life event affecting family 01/29/2020   Hyperlipidemia associated with type 2 diabetes mellitus ([place]) 01/29/2020   Personal history of fall 01/29/2020   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   Unintended weight loss 01/29/2020   Pain due to onychomycosis of toenails of both feet 08/01/2019   Porokeratosis 08/01/2019   Influenza vaccine needed 02/24/2019   Diabetes mellitus ([place]) 02/24/2019   Malignant neoplasm of prostate ([place]) 09/23/2018   Hyperlipemia 11/24/2016   History of CVA with residual deficit 11/24/2016   GERD (gastroesophageal reflux disease) 08/09/2014   Atrial fibrillation ([place])    Hemiparesis affecting left side as late effect of stroke ([place]) 05/01/2014   ERECTILE DYSFUNCTION, 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 90 tablet 1   apixaban (ELIQUIS) 5 MG TABS tablet TAKE 1 TABLET BY MOUTH TWICE DAILY. 60 tablet 3   B Complex Vitamins (VITAMIN B COMPLEX) TABS Take 1 tablet by mouth daily.     diclofenac Sodium (VOLTAREN) 1 % GEL Apply 2 g topically 3 (three) times daily as needed (pain). 100 g 0   fluticasone (FLONASE) 50 MCG/ACT nasal spray Place 1 spray into both nostrils daily as needed for allergies or rhinitis.     gabapentin (NEURONTIN) 100  MG capsule TAKE 1 CAPSULE (100 MG TOTAL) BY MOUTH 3 (THREE) TIMES DAILY. NEEDED FOR NUMBNESS 90 capsule 1   glimepiride (AMARYL) 1 MG tablet Take 1 tablet (1 mg total) by mouth daily with breakfast. 30 tablet 3   HYDROcodone-acetaminophen (NORCO) 10-325 MG tablet Take 1 tablet by mouth every 6 (six) hours as needed for pain.     hydrOXYzine (VISTARIL) 25 MG capsule Take 1 capsule (25 mg total) by mouth 2 (two) times daily as needed. 30 capsule 3   latanoprost (XALATAN) 0.005 % ophthalmic solution Place 1 drop into both eyes nightly. 2.5 mL 3   latanoprost (XALATAN) 0.005 % ophthalmic solution Place 1 drop into both eyes nightly. 2.5 mL 3   methocarbamol (ROBAXIN) 750 MG tablet Take 1 tablet (750 mg total) by mouth 3 (three) times daily as needed (muscle spasm/pain). 15 tablet 0   Multiple Vitamin (MULTIVITAMIN WITH MINERALS) TABS tablet Take 1 tablet by mouth daily.     pravastatin (PRAVACHOL) 10 MG tablet Take 1 tablet (10 mg total) by mouth daily. 90 tablet 1   prochlorperazine (COMPAZINE) 10 MG tablet Take 1 tablet (10 mg total) by mouth every 6 (six) hours as needed for nausea or vomiting. 30 tablet 0   sennosides-docusate sodium (SENOKOT-S) 8.6-50 MG tablet Take 1 tablet by mouth daily. 15 tablet 0   tamsulosin (FLOMAX) 0.4 MG CAPS capsule Take 1 capsule (0.4 mg total) by mouth daily after supper. 30 capsule 5   No current facility-administered medications on file prior to visit.    Allergies  Allergen Reactions   Lisinopril Swelling    Throat swelling   Tramadol Hcl     "Makes me crazy"    Social History   Socioeconomic History   Marital status: Married    Spouse name: Not on file   Number of children: 5   Years of education: Not on file   Highest education level: Not on file  Occupational History   Occupation: Caregiver    Comment: retired  Tobacco Use   Smoking status: Every Day    Packs/day: 0.25    Years: 10.00    Total pack years: 2.50    Types: Cigarettes    Smokeless tobacco: Never   Tobacco comments:    Quit 05/15/14  Vaping Use   Vaping Use: Never used  Substance and Sexual Activity   Alcohol use: Yes    Alcohol/week: 1.0 - 2.0 standard drink of alcohol    Types: 1 - 2 Standard drinks or equivalent per week    Comment: occasional wine   Drug use: No   Sexual activity: Not Currently  Other Topics Concern   Not on file  Social History Narrative   Lives with his wife   5 children, grown.   7 grandchildren.    Social Determinants of Health   Financial Resource Strain: Not on file  Food Insecurity: Not on file  Transportation Needs:  Not on file  Physical Activity: Not on file  Stress: Not on file  Social Connections: Not on file  Intimate Partner Violence: Not on file    Family History  Problem Relation Age of Onset   Diabetes Mother    Breast cancer Mother    Diabetes Father    Breast cancer Sister    Prostate cancer Neg Hx    Colon cancer Neg Hx    Pancreatic cancer Neg Hx     Past Surgical History:  Procedure Laterality Date   CATARACT EXTRACTION  06/2017   CYSTOSCOPY  05/2017   INTERCOSTAL NERVE BLOCK Left 05/13/2020   Procedure: INTERCOSTAL NERVE BLOCK;  Surgeon: Lajuana Matte, MD;  Location: [facility];  Service: Thoracic;  Laterality: Left;   LUMBAR LAMINECTOMY/DECOMPRESSION MICRODISCECTOMY Right 03/11/2021   Procedure: Microdiscectomy - right - Lumbar four-Lumbar five extraforaminal;  Surgeon: Earnie Larsson, MD;  Location: [facility];  Service: Neurosurgery;  Laterality: Right;   PROSTATE BIOPSY     WEDGE RESECTION Left 05/13/2020   XI robotic assisted thorascopy    ROS: Review of Systems Negative except as stated above  PHYSICAL EXAM: BP (!) 150/84   Pulse 95   Temp 98.6 F (37 C) (Oral)   Ht '6\' 1"'$  (1.854 m)   Wt 190 lb 12.8 oz (86.5 kg)   SpO2 98%   BMI 25.17 kg/m   Physical Exam  General appearance - alert, well appearing, pleasant elderly AAM and in no distress Nose - normal and patent, no  erythema, discharge or polyps Mouth - mucous membranes moist, pharynx normal without lesions Neck - supple, no significant adenopathy Chest - clear to auscultation, no wheezes, rales or rhonchi, symmetric air entry Heart - normal rate, regular rhythm, normal S1, S2, no murmurs, rubs, clicks or gallops Musculoskeletal - ambulates with walker Extremities - no LE edema      Latest Ref Rng & Units 11/17/2021    3:26 PM 03/12/2021   11:56 PM 03/07/2021    9:07 AM  CMP  Glucose 70 - 99 mg/dL 167  107  151   BUN 8 - 23 mg/dL '16  22  27   '$ Creatinine 0.61 - 1.24 mg/dL 1.36  0.96  1.01   Sodium 135 - 145 mmol/L 136  138  136   Potassium 3.5 - 5.1 mmol/L 4.2  3.8  3.8   Chloride 98 - 111 mmol/L 106  104  103   CO2 22 - 32 mmol/L '23  27  25   '$ Calcium 8.9 - 10.3 mg/dL 9.0  9.3  9.5   Total Protein 6.5 - 8.1 g/dL 7.0     Total Bilirubin 0.3 - 1.2 mg/dL 0.3     Alkaline Phos 38 - 126 U/L 77     AST 15 - 41 U/L 14     ALT 0 - 44 U/L 12      Lipid Panel     Component Value Date/Time   CHOL 189 01/29/2020 1022   TRIG 123 01/29/2020 1022   HDL 58 01/29/2020 1022   CHOLHDL 3.3 01/29/2020 1022   CHOLHDL 4.2 05/02/2014 0000   VLDL 72 (H) 05/02/2014 0000   LDLCALC 109 (H) 01/29/2020 1022    CBC    Component Value Date/Time   WBC 3.4 (L) 11/17/2021 1526   RBC 4.31 11/17/2021 1526   HGB 12.9 (L) 11/17/2021 1526   HGB 13.8 01/29/2020 1022   HCT 39.0 11/17/2021 1526   HCT 40.6 01/29/2020 1022  PLT 274 11/17/2021 1526   PLT 268 01/29/2020 1022   MCV 90.5 11/17/2021 1526   MCV 89 01/29/2020 1022   MCH 29.9 11/17/2021 1526   MCHC 33.1 11/17/2021 1526   RDW 14.7 11/17/2021 1526   RDW 13.8 01/29/2020 1022   LYMPHSABS 0.7 11/17/2021 1526   MONOABS 0.2 11/17/2021 1526   EOSABS 0.0 11/17/2021 1526   BASOSABS 0.0 11/17/2021 1526    ASSESSMENT AND PLAN: 1. Preoperative evaluation to rule out surgical contraindication 2. Lumbar radiculopathy Patient at acceptable risk for upcoming surgery.   No further work-up recommended at this time.  Patient is agreeable with proceeding with surgery.  3. Type 2 diabetes mellitus with other neurologic complication, without long-term current use of insulin (HCC) Controlled.  4. Essential hypertension Not at goal today but he has not taken the amlodipine as yet for today.  He will continue to take daily.  5. Paroxysmal atrial fibrillation (HCC) Advised to hold Eliquis at least 3 days prior to his surgery.  Patient tells me that he already started holding the Eliquis as of this morning.  I do not recommend any bridging at this time.  Advised to restart the Eliquis postsurgery when told by the neurosurgeon that it is safe to do so.    Patient was given the opportunity to ask questions.  Patient verbalized understanding of the plan and was able to repeat key elements of the plan.   This documentation was completed using Radio producer.  Any transcriptional errors are unintentional.  No orders of the defined types were placed in this encounter.    Requested Prescriptions    No prescriptions requested or ordered in this encounter    Return in about 4 months (around 04/28/2022).  Karle Plumber, MD, FACP

## 2021-12-26 NOTE — Anesthesia Preprocedure Evaluation (Signed)
Anesthesia Evaluation  Patient identified by MRN, date of birth, ID band Patient awake    Reviewed: Allergy & Precautions, NPO status , Patient's Chart, lab work & pertinent test results  Airway Mallampati: III  TM Distance: >3 FB Neck ROM: Full    Dental  (+) Dental Advisory Given, Edentulous Upper, Missing, Partial Lower   Pulmonary Current Smoker and Patient abstained from smoking.,    Pulmonary exam normal breath sounds clear to auscultation       Cardiovascular hypertension, Pt. on medications Normal cardiovascular exam+ dysrhythmias Atrial Fibrillation  Rhythm:Regular Rate:Normal     Neuro/Psych  Neuromuscular disease CVA, Residual Symptoms    GI/Hepatic Neg liver ROS, GERD  ,  Endo/Other  diabetes, Type 2, Oral Hypoglycemic Agents  Renal/GU negative Renal ROS   Prostate cancer     Musculoskeletal  (+) Arthritis ,   Abdominal   Peds  Hematology  (+) Blood dyscrasia (Eliquis), , REFUSES BLOOD PRODUCTS,   Anesthesia Other Findings Day of surgery medications reviewed with the patient.  Reproductive/Obstetrics                           Anesthesia Physical Anesthesia Plan  ASA: 3  Anesthesia Plan: General   Post-op Pain Management: Tylenol PO (pre-op)*   Induction: Intravenous  PONV Risk Score and Plan: 1 and Dexamethasone and Ondansetron  Airway Management Planned: Oral ETT  Additional Equipment:   Intra-op Plan:   Post-operative Plan: Extubation in OR  Informed Consent: I have reviewed the patients History and Physical, chart, labs and discussed the procedure including the risks, benefits and alternatives for the proposed anesthesia with the patient or authorized representative who has indicated his/her understanding and acceptance.     Dental advisory given  Plan Discussed with: CRNA  Anesthesia Plan Comments: (PAT note written 12/26/2021 by Myra Gianotti, PA-C. )        Anesthesia Quick Evaluation

## 2021-12-26 NOTE — Patient Instructions (Signed)
Continue to hold off on taking your Eliquis.  After your surgery, find out from the neurosurgeon when it would be safe to resume the Eliquis.  Your blood pressure is elevated today.  Please take your amlodipine as soon as you return home and continue taking every day.

## 2021-12-26 NOTE — Progress Notes (Signed)
Anesthesia Chart Review: [name]  Case: [id number] Date/Time: 12/29/21 1213   Procedure: Extraforaminal microdiscectomy - left - L4-L5 (Left: Back) - 3C   Anesthesia type: General   Pre-op diagnosis: HNP   Location: [facility] OR ROOM 35 / [place] OR   Surgeons: Earnie Larsson, MD       DISCUSSION: Patient is a 71 year old male scheduled for the above procedure.  He missed his 12/25/2021 PAT RN visit.  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 Normal coronaries in 2007.   He had a cardiology evaluation by Dr. Debara Pickett on 03/07/21 prior to his microdiscectomy in 02/2021. 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  I am not convinced he could do 4 metabolic equivalents of activity because of his back issue.  I offered stress testing however he did not think that it was necessary.  He is really not having any unstable anginal symptoms.  EKG previously had shown lateral T wave inversions however those have resolved today.  This could be ischemia or up to 50-60 other causes of T wave changes.  Lead placement could have been an issue as well. His cardiac exam is benign.  He wishes to proceed with surgery which is already scheduled on Tuesday.  Is probably unlikely we can get stress testing any had done before then.  He chooses to proceed with surgery and understands that there may be an increased risk but he is excepting of that.  I would tend to agree with that assessment.  From my standpoint then cleared for surgery.  Can follow-up with me as needed and will continue to address his cardiovascular risk factors." Patient went on  to have right L4-5 microdiscectomy 03/11/21.  He was evaluated by his PCP Dr. [name] on 12/26/2021 at 2:30 PM. Note is not yet finalized, but current documentation states, "Continue to hold off on taking your Eliquis.  After your surgery, find out from the neurosurgeon when it would be safe to resume the Eliquis.   Your blood pressure is elevated today.  Please take your amlodipine as soon as you return home and continue taking every day." He reported last Eliquis 12/25/21.   Anesthesia team to evaluate on the day of surgery.   VS: Ht '6\' 1"'$  (1.854 m)   Wt 85.3 kg   BMI 24.81 kg/m  BP Readings from Last 3 Encounters:  11/20/21 132/82  11/17/21 (!) 151/94  03/12/21 126/87   Pulse Readings from Last 3 Encounters:  11/20/21 95  11/17/21 (!) 58  03/12/21 62     PROVIDERS: [name], MD is PCP  Pixie Casino, MD is cardiologist Festus Aloe, MD is Gwyndolyn Kaufman, MD is RAD-[facility]   LABS: For day of surgery as indicated. Currently, last results include: Lab Results  Component Value Date   WBC 3.4 (L) 11/17/2021   HGB 12.9 (L) 11/17/2021   HCT 39.0 11/17/2021   PLT 274 11/17/2021   GLUCOSE 167 (H) 11/17/2021   ALT 12 11/17/2021   AST 14 (L) 11/17/2021   NA 136 11/17/2021   K 4.2 11/17/2021   CL 106 11/17/2021   CREATININE 1.36 (H) 11/17/2021   BUN 16 11/17/2021   CO2 23 11/17/2021  HGBA1C 6.7 11/20/2021     IMAGES: MRI L-spine 12/04/21: IMPRESSION: 1. Interval right L4-5 extraforaminal microdiscectomy with improved right neural foraminal patency. 2. Unchanged severe left neural foraminal stenosis at L4-5 and L5-S1. 3. Unchanged moderate to severe right neural foraminal stenosis at L3-4.   EKG: 03/07/21:  Sinus bradycardia. Rate 57. Inferior infarct , age undetermined. T wave abnormality, consider lateral ischemia   CV: TTE 05/02/14: - Procedure narrative: Transthoracic echocardiography. Image    quality was adequate. The study was  technically difficult due to    variations in respirations.  - Left ventricle: The cavity size was normal. Wall thickness was    increased in a pattern of mild LVH. Systolic function was normal.    The estimated ejection fraction was in the range of 60% to 65%.    Wall motion was normal; there were no regional wall motion    abnormalities. Doppler parameters are consistent with abnormal    left ventricular relaxation (grade 1 diastolic dysfunction).  - Aortic valve: There was no stenosis.  - Mitral valve: There was no significant regurgitation.  - Right ventricle: The cavity size was normal. Systolic function    was normal.  - Pulmonary arteries: No complete TR doppler jet so unable to    estimate PA systolic pressure.  - Inferior vena cava: The vessel was normal in size. The    respirophasic diameter changes were in the normal range (>= 50%),    consistent with normal central venous pressure.   Impressions:  - Normal LV size and systolic function, EF 78-29%. Mild LVH. Normal    RV size and systolic function. No significant valvular    abnormalities.    US Carotid 05/03/14: Summary:  Findings suggest 1-39% internal carotid artery stenosis  bilaterally. Vertebral arteries are patent with antegrade flow.    Cardiac cath 05/28/06: IMPRESSION: 1. Normal coronary arteries with no significant coronary calcification      or luminal irregularities noted. 2. Normal left ventricular function.   Past Medical History:  Diagnosis Date   Abscess of anal and rectal regions 07/15/2007   Qualifier: Diagnosis of  By: Amil Amen MD, Elizabeth     Atrial fibrillation [facility])    Diabetes mellitus without complication ([place]) 5621   type 2   FISTULA, ANAL 01/27/2007   Qualifier: Diagnosis of  By: Amil Amen MD, Elizabeth     GERD (gastroesophageal reflux disease)    Hypertension Dx 2008   Prostate cancer ([place])    Spontaneous pneumothorax    Stroke [facility])     Past Surgical History:  Procedure  Laterality Date   CATARACT EXTRACTION  06/2017   CYSTOSCOPY  05/2017   INTERCOSTAL NERVE BLOCK Left 05/13/2020   Procedure: INTERCOSTAL NERVE BLOCK;  Surgeon: Lajuana Matte, MD;  Location: [facility] OR;  Service: Thoracic;  Laterality: Left;   LUMBAR LAMINECTOMY/DECOMPRESSION MICRODISCECTOMY Right 03/11/2021   Procedure: Microdiscectomy - right - Lumbar four-Lumbar five extraforaminal;  Surgeon: Earnie Larsson, MD;  Location: [place];  Service: Neurosurgery;  Laterality: Right;   PROSTATE BIOPSY     WEDGE RESECTION Left 05/13/2020   XI robotic assisted thorascopy    MEDICATIONS: No current facility-administered medications for this encounter.    amLODipine (NORVASC) 10 MG tablet   apixaban (ELIQUIS) 5 MG TABS tablet   B Complex Vitamins (VITAMIN B COMPLEX) TABS   diclofenac Sodium (VOLTAREN) 1 % GEL   fluticasone (FLONASE) 50 MCG/ACT nasal spray   glimepiride (AMARYL) 1 MG tablet   HYDROcodone-acetaminophen (NORCO) 10-325  MG tablet   latanoprost (XALATAN) 0.005 % ophthalmic solution   Multiple Vitamin (MULTIVITAMIN WITH MINERALS) TABS tablet   sennosides-docusate sodium (SENOKOT-S) 8.6-50 MG tablet   cyclobenzaprine (FLEXERIL) 10 MG tablet   gabapentin (NEURONTIN) 100 MG capsule   hydrOXYzine (VISTARIL) 25 MG capsule   latanoprost (XALATAN) 0.005 % ophthalmic solution   methocarbamol (ROBAXIN) 750 MG tablet   pravastatin (PRAVACHOL) 10 MG tablet   predniSONE (DELTASONE) 20 MG tablet   prochlorperazine (COMPAZINE) 10 MG tablet   tamsulosin (FLOMAX) 0.4 MG CAPS capsule    Myra Gianotti, PA-C Surgical Short Stay/Anesthesiology [facility] Phone [phone] [facility] Phone [phone] 12/26/2021 4:48 PM

## 2021-12-26 NOTE — Progress Notes (Signed)
Patient is now okay to receive blood or blood products if needed.  Blood Consent form placed in patient's chart to sign on DOS.

## 2021-12-26 NOTE — Progress Notes (Signed)
PCP - Dr Karle Plumber Cardiologist - Dr Lyman Bishop  Chest x-ray - 07/26/20 (2V) EKG - 02/25/21 Stress Test - 2005 ECHO - 05/02/14 Cardiac Cath - 05/28/06  ICD Pacemaker/Loop - n/a  Sleep Study -  n/a CPAP - none  Do not take Glimepiride on the morning of surgery.  If your blood sugar is less than 70 mg/dL, you will need to treat for low blood sugar: Treat a low blood sugar (less than 70 mg/dL) with  cup of clear juice (cranberry or apple), 4 glucose tablets, OR glucose gel. Recheck blood sugar in 15 minutes after treatment (to make sure it is greater than 70 mg/dL). If your blood sugar is not greater than 70 mg/dL on recheck, call [phone] for further instructions.  Blood Thinner Instructions:  Last dose of Eliquis was on 12/25/21.    Anesthesia review: Yes  STOP now taking any Aspirin (unless otherwise instructed by your surgeon), Aleve, Naproxen, Ibuprofen, Motrin, Advil, Goody's, BC's, all herbal medications, fish oil, and all vitamins.   Coronavirus Screening Do you have any of the following symptoms:  Cough yes/no: No Fever (>100.49F)  yes/no: No Runny nose yes/no: No Sore throat yes/no: No Difficulty breathing/shortness of breath  yes/no: No  Have you traveled in the last 14 days and where? yes/no: No  Patient verbalized understanding of instructions that were given via phone.

## 2021-12-29 ENCOUNTER — Ambulatory Visit (HOSPITAL_COMMUNITY): Payer: Medicare HMO | Admitting: Vascular Surgery

## 2021-12-29 ENCOUNTER — Encounter (HOSPITAL_COMMUNITY)
Admission: RE | Disposition: A | Discharge status: Home / Self Care | Payer: Self-pay | Source: Home / Self Care | Attending: Neurosurgery

## 2021-12-29 ENCOUNTER — Other Ambulatory Visit: Payer: Self-pay

## 2021-12-29 ENCOUNTER — Ambulatory Visit (HOSPITAL_COMMUNITY): Payer: Medicare HMO

## 2021-12-29 ENCOUNTER — Encounter (HOSPITAL_COMMUNITY): Payer: Self-pay | Admitting: Neurosurgery

## 2021-12-29 ENCOUNTER — Ambulatory Visit (HOSPITAL_BASED_OUTPATIENT_CLINIC_OR_DEPARTMENT_OTHER): Payer: Medicare HMO | Admitting: Vascular Surgery

## 2021-12-29 ENCOUNTER — Observation Stay (HOSPITAL_COMMUNITY)
Admission: RE | Admit: 2021-12-29 | Discharge: 2021-12-29 | Disposition: A | Discharge status: Home / Self Care | Payer: Medicare HMO | Attending: Neurosurgery | Admitting: Neurosurgery

## 2021-12-29 DIAGNOSIS — M5116 Intervertebral disc disorders with radiculopathy, lumbar region: Secondary | ICD-10-CM | POA: Diagnosis not present

## 2021-12-29 DIAGNOSIS — Z7901 Long term (current) use of anticoagulants: Secondary | ICD-10-CM | POA: Insufficient documentation

## 2021-12-29 DIAGNOSIS — I10 Essential (primary) hypertension: Secondary | ICD-10-CM | POA: Insufficient documentation

## 2021-12-29 DIAGNOSIS — E119 Type 2 diabetes mellitus without complications: Secondary | ICD-10-CM | POA: Diagnosis not present

## 2021-12-29 DIAGNOSIS — M4726 Other spondylosis with radiculopathy, lumbar region: Secondary | ICD-10-CM | POA: Insufficient documentation

## 2021-12-29 DIAGNOSIS — Z981 Arthrodesis status: Secondary | ICD-10-CM | POA: Diagnosis not present

## 2021-12-29 DIAGNOSIS — F17210 Nicotine dependence, cigarettes, uncomplicated: Secondary | ICD-10-CM | POA: Insufficient documentation

## 2021-12-29 DIAGNOSIS — Z79899 Other long term (current) drug therapy: Secondary | ICD-10-CM | POA: Insufficient documentation

## 2021-12-29 DIAGNOSIS — M48061 Spinal stenosis, lumbar region without neurogenic claudication: Secondary | ICD-10-CM | POA: Diagnosis not present

## 2021-12-29 DIAGNOSIS — Z7984 Long term (current) use of oral hypoglycemic drugs: Secondary | ICD-10-CM | POA: Insufficient documentation

## 2021-12-29 DIAGNOSIS — Z8546 Personal history of malignant neoplasm of prostate: Secondary | ICD-10-CM | POA: Insufficient documentation

## 2021-12-29 DIAGNOSIS — M5416 Radiculopathy, lumbar region: Secondary | ICD-10-CM | POA: Diagnosis present

## 2021-12-29 DIAGNOSIS — M47816 Spondylosis without myelopathy or radiculopathy, lumbar region: Secondary | ICD-10-CM | POA: Diagnosis not present

## 2021-12-29 DIAGNOSIS — Z8673 Personal history of transient ischemic attack (TIA), and cerebral infarction without residual deficits: Secondary | ICD-10-CM | POA: Diagnosis not present

## 2021-12-29 HISTORY — DX: Gastro-esophageal reflux disease without esophagitis: K21.9

## 2021-12-29 HISTORY — PX: LUMBAR LAMINECTOMY/DECOMPRESSION MICRODISCECTOMY: SHX5026

## 2021-12-29 LAB — CBC
HCT: 39.5 % (ref 39.0–52.0)
Hemoglobin: 13.4 g/dL (ref 13.0–17.0)
MCH: 30.3 pg (ref 26.0–34.0)
MCHC: 33.9 g/dL (ref 30.0–36.0)
MCV: 89.4 fL (ref 80.0–100.0)
Platelets: 225 10*3/uL (ref 150–400)
RBC: 4.42 MIL/uL (ref 4.22–5.81)
RDW: 15 % (ref 11.5–15.5)
WBC: 3.7 10*3/uL — ABNORMAL LOW (ref 4.0–10.5)
nRBC: 0 % (ref 0.0–0.2)

## 2021-12-29 LAB — BASIC METABOLIC PANEL
Anion gap: 10 (ref 5–15)
BUN: 13 mg/dL (ref 8–23)
CO2: 21 mmol/L — ABNORMAL LOW (ref 22–32)
Calcium: 8.9 mg/dL (ref 8.9–10.3)
Chloride: 105 mmol/L (ref 98–111)
Creatinine, Ser: 1.12 mg/dL (ref 0.61–1.24)
GFR, Estimated: >60 mL/min (ref >60)
Glucose, Bld: 133 mg/dL — ABNORMAL HIGH (ref 70–99)
Potassium: 3.8 mmol/L (ref 3.5–5.1)
Sodium: 136 mmol/L (ref 135–145)

## 2021-12-29 LAB — SURGICAL PCR SCREEN
MRSA, PCR: NEGATIVE
Staphylococcus aureus: NEGATIVE

## 2021-12-29 LAB — GLUCOSE, CAPILLARY
Glucose-Capillary: 156 mg/dL — ABNORMAL HIGH (ref 70–99)
Glucose-Capillary: 112 mg/dL — ABNORMAL HIGH (ref 70–99)

## 2021-12-29 SURGERY — LUMBAR LAMINECTOMY/DECOMPRESSION MICRODISCECTOMY 1 LEVEL
Anesthesia: General | Site: Back | Laterality: Left

## 2021-12-29 MED ORDER — ACETAMINOPHEN 500 MG PO TABS
1000.0000 mg | ORAL_TABLET | Freq: Once | ORAL | Status: AC
  Administered 2021-12-29: 1000 mg via ORAL
  Filled 2021-12-29: qty 2

## 2021-12-29 MED ORDER — SODIUM CHLORIDE 0.9 % IV SOLN
250.0000 mL | INTRAVENOUS | Status: DC
Start: 2021-12-29 — End: 2021-12-30
  Administered 2021-12-29: 250 mL via INTRAVENOUS

## 2021-12-29 MED ORDER — LACTATED RINGERS IV SOLN: INTRAVENOUS | Status: DC

## 2021-12-29 MED ORDER — THROMBIN 5000 UNITS EX SOLR
CUTANEOUS | Status: AC
  Filled 2021-12-29: qty 10000

## 2021-12-29 MED ORDER — ACETAMINOPHEN 325 MG PO TABS: 650.0000 mg | ORAL_TABLET | ORAL | Status: DC | PRN

## 2021-12-29 MED ORDER — INSULIN ASPART 100 UNIT/ML IJ SOLN
INTRAMUSCULAR | Status: DC
Start: 2021-12-29 — End: 2021-12-29
  Filled 2021-12-29: qty 1

## 2021-12-29 MED ORDER — LATANOPROST 0.005 % OP SOLN
1.0000 [drp] | Freq: Every day | OPHTHALMIC | Status: DC
  Filled 2021-12-29: qty 2.5

## 2021-12-29 MED ORDER — ONDANSETRON HCL 4 MG PO TABS: 4.0000 mg | ORAL_TABLET | Freq: Four times a day (QID) | ORAL | Status: DC | PRN

## 2021-12-29 MED ORDER — CHLORHEXIDINE GLUCONATE CLOTH 2 % EX PADS: 6.0000 | MEDICATED_PAD | Freq: Once | CUTANEOUS | Status: DC

## 2021-12-29 MED ORDER — SENNA-DOCUSATE SODIUM 8.6-50 MG PO TABS: 1.0000 | ORAL_TABLET | Freq: Every day | ORAL | Status: DC

## 2021-12-29 MED ORDER — HYDROXYZINE PAMOATE 25 MG PO CAPS: 25.0000 mg | ORAL_CAPSULE | Freq: Two times a day (BID) | ORAL | Status: DC | PRN

## 2021-12-29 MED ORDER — KETOROLAC TROMETHAMINE 15 MG/ML IJ SOLN
15.0000 mg | Freq: Four times a day (QID) | INTRAMUSCULAR | Status: DC
  Administered 2021-12-29: 15 mg via INTRAVENOUS
  Filled 2021-12-29: qty 1

## 2021-12-29 MED ORDER — ESMOLOL HCL 100 MG/10ML IV SOLN
INTRAVENOUS | Status: DC | PRN
  Administered 2021-12-29: 20 mg via INTRAVENOUS

## 2021-12-29 MED ORDER — HEMOSTATIC AGENTS (NO CHARGE) OPTIME
TOPICAL | Status: DC | PRN
  Administered 2021-12-29: 1 via TOPICAL

## 2021-12-29 MED ORDER — HYDROCODONE-ACETAMINOPHEN 10-325 MG PO TABS
2.0000 | ORAL_TABLET | ORAL | Status: DC | PRN
  Administered 2021-12-29: 2 via ORAL

## 2021-12-29 MED ORDER — TAMSULOSIN HCL 0.4 MG PO CAPS: 0.4000 mg | ORAL_CAPSULE | Freq: Every day | ORAL | Status: DC

## 2021-12-29 MED ORDER — FENTANYL CITRATE (PF) 250 MCG/5ML IJ SOLN
INTRAMUSCULAR | Status: DC | PRN
  Administered 2021-12-29: 50 ug via INTRAVENOUS
  Administered 2021-12-29: 100 ug via INTRAVENOUS

## 2021-12-29 MED ORDER — ADULT MULTIVITAMIN W/MINERALS CH: 1.0000 | ORAL_TABLET | Freq: Every day | ORAL | Status: DC

## 2021-12-29 MED ORDER — FLUTICASONE PROPIONATE 50 MCG/ACT NA SUSP
1.0000 | Freq: Every day | NASAL | Status: DC | PRN
Start: 2021-12-29 — End: 2021-12-30

## 2021-12-29 MED ORDER — EPHEDRINE SULFATE (PRESSORS) 50 MG/ML IJ SOLN
INTRAMUSCULAR | Status: DC | PRN
  Administered 2021-12-29: 5 mg via INTRAVENOUS

## 2021-12-29 MED ORDER — CYCLOBENZAPRINE HCL 10 MG PO TABS
ORAL_TABLET | ORAL | Status: AC
  Filled 2021-12-29: qty 1

## 2021-12-29 MED ORDER — BUPIVACAINE HCL (PF) 0.25 % IJ SOLN
INTRAMUSCULAR | Status: DC | PRN
  Administered 2021-12-29: 15 mL

## 2021-12-29 MED ORDER — HYDROCODONE-ACETAMINOPHEN 10-325 MG PO TABS
ORAL_TABLET | ORAL | Status: AC
  Filled 2021-12-29: qty 2

## 2021-12-29 MED ORDER — HYDROMORPHONE HCL 1 MG/ML IJ SOLN: 1.0000 mg | INTRAMUSCULAR | Status: DC | PRN

## 2021-12-29 MED ORDER — PHENYLEPHRINE HCL-NACL 20-0.9 MG/250ML-% IV SOLN
INTRAVENOUS | Status: DC | PRN
  Administered 2021-12-29: 40 ug/min via INTRAVENOUS

## 2021-12-29 MED ORDER — VITAMIN B COMPLEX PO TABS: 1.0000 | ORAL_TABLET | Freq: Every day | ORAL | Status: DC

## 2021-12-29 MED ORDER — ONDANSETRON HCL 4 MG/2ML IJ SOLN
INTRAMUSCULAR | Status: DC | PRN
  Administered 2021-12-29: 4 mg via INTRAVENOUS

## 2021-12-29 MED ORDER — INSULIN ASPART 100 UNIT/ML IJ SOLN
0.0000 [IU] | INTRAMUSCULAR | Status: DC | PRN
  Administered 2021-12-29: 2 [IU] via SUBCUTANEOUS

## 2021-12-29 MED ORDER — DEXAMETHASONE SODIUM PHOSPHATE 10 MG/ML IJ SOLN
INTRAMUSCULAR | Status: AC
  Filled 2021-12-29: qty 1

## 2021-12-29 MED ORDER — PRAVASTATIN SODIUM 10 MG PO TABS: 10.0000 mg | ORAL_TABLET | Freq: Every day | ORAL | Status: DC

## 2021-12-29 MED ORDER — PROPOFOL 10 MG/ML IV BOLUS
INTRAVENOUS | Status: AC
  Filled 2021-12-29: qty 20

## 2021-12-29 MED ORDER — PROPOFOL 10 MG/ML IV BOLUS
INTRAVENOUS | Status: DC | PRN
  Administered 2021-12-29: 150 mg via INTRAVENOUS

## 2021-12-29 MED ORDER — THROMBIN 5000 UNITS EX SOLR
CUTANEOUS | Status: DC | PRN
  Administered 2021-12-29 (×2): 5000 [IU] via TOPICAL

## 2021-12-29 MED ORDER — ACETAMINOPHEN 650 MG RE SUPP: 650.0000 mg | RECTAL | Status: DC | PRN

## 2021-12-29 MED ORDER — PHENOL 1.4 % MT LIQD: 1.0000 | OROMUCOSAL | Status: DC | PRN

## 2021-12-29 MED ORDER — GABAPENTIN 100 MG PO CAPS: 100.0000 mg | ORAL_CAPSULE | Freq: Three times a day (TID) | ORAL | Status: DC

## 2021-12-29 MED ORDER — ONDANSETRON HCL 4 MG/2ML IJ SOLN
INTRAMUSCULAR | Status: AC
  Filled 2021-12-29: qty 2

## 2021-12-29 MED ORDER — ORAL CARE MOUTH RINSE: 15.0000 mL | Freq: Once | OROMUCOSAL | Status: AC

## 2021-12-29 MED ORDER — METHOCARBAMOL 750 MG PO TABS: 750.0000 mg | ORAL_TABLET | Freq: Three times a day (TID) | ORAL | Status: DC | PRN

## 2021-12-29 MED ORDER — CHLORHEXIDINE GLUCONATE 0.12 % MT SOLN
15.0000 mL | Freq: Once | OROMUCOSAL | Status: AC
  Administered 2021-12-29: 15 mL via OROMUCOSAL
  Filled 2021-12-29: qty 15

## 2021-12-29 MED ORDER — KETOROLAC TROMETHAMINE 30 MG/ML IJ SOLN
INTRAMUSCULAR | Status: DC | PRN
  Administered 2021-12-29: 30 mg via INTRAVENOUS

## 2021-12-29 MED ORDER — CYCLOBENZAPRINE HCL 10 MG PO TABS
10.0000 mg | ORAL_TABLET | Freq: Three times a day (TID) | ORAL | Status: DC | PRN
  Administered 2021-12-29: 10 mg via ORAL

## 2021-12-29 MED ORDER — AMLODIPINE BESYLATE 5 MG PO TABS: 10.0000 mg | ORAL_TABLET | Freq: Every day | ORAL | Status: DC

## 2021-12-29 MED ORDER — KETOROLAC TROMETHAMINE 15 MG/ML IJ SOLN
INTRAMUSCULAR | Status: AC
  Filled 2021-12-29: qty 1

## 2021-12-29 MED ORDER — LATANOPROST 0.005 % OP SOLN
1.0000 [drp] | Freq: Every day | OPHTHALMIC | Status: DC
Start: 2021-12-29 — End: 2021-12-29

## 2021-12-29 MED ORDER — SODIUM CHLORIDE 0.9% FLUSH
3.0000 mL | INTRAVENOUS | Status: DC | PRN
Start: 2021-12-29 — End: 2021-12-30

## 2021-12-29 MED ORDER — CEFAZOLIN SODIUM-DEXTROSE 1-4 GM/50ML-% IV SOLN: 1.0000 g | Freq: Three times a day (TID) | INTRAVENOUS | Status: DC

## 2021-12-29 MED ORDER — SUGAMMADEX SODIUM 200 MG/2ML IV SOLN
INTRAVENOUS | Status: DC | PRN
  Administered 2021-12-29: 200 mg via INTRAVENOUS

## 2021-12-29 MED ORDER — HYDROCODONE-ACETAMINOPHEN 10-325 MG PO TABS: 1.0000 | ORAL_TABLET | Freq: Four times a day (QID) | ORAL | 0 refills | Status: DC | PRN

## 2021-12-29 MED ORDER — ROCURONIUM BROMIDE 10 MG/ML (PF) SYRINGE
PREFILLED_SYRINGE | INTRAVENOUS | Status: DC | PRN
  Administered 2021-12-29: 60 mg via INTRAVENOUS

## 2021-12-29 MED ORDER — MENTHOL 3 MG MT LOZG: 1.0000 | LOZENGE | OROMUCOSAL | Status: DC | PRN

## 2021-12-29 MED ORDER — PROCHLORPERAZINE MALEATE 10 MG PO TABS: 10.0000 mg | ORAL_TABLET | Freq: Four times a day (QID) | ORAL | Status: DC | PRN

## 2021-12-29 MED ORDER — FENTANYL CITRATE (PF) 250 MCG/5ML IJ SOLN
INTRAMUSCULAR | Status: AC
  Filled 2021-12-29: qty 5

## 2021-12-29 MED ORDER — DEXAMETHASONE SODIUM PHOSPHATE 10 MG/ML IJ SOLN
INTRAMUSCULAR | Status: DC | PRN
  Administered 2021-12-29: 5 mg via INTRAVENOUS

## 2021-12-29 MED ORDER — KETOROLAC TROMETHAMINE 30 MG/ML IJ SOLN: INTRAMUSCULAR | Status: DC | PRN

## 2021-12-29 MED ORDER — 0.9 % SODIUM CHLORIDE (POUR BTL) OPTIME
TOPICAL | Status: DC | PRN
  Administered 2021-12-29: 1000 mL

## 2021-12-29 MED ORDER — SODIUM CHLORIDE 0.9% FLUSH
3.0000 mL | Freq: Two times a day (BID) | INTRAVENOUS | Status: DC
  Administered 2021-12-29: 3 mL via INTRAVENOUS

## 2021-12-29 MED ORDER — CEFAZOLIN SODIUM-DEXTROSE 2-4 GM/100ML-% IV SOLN
2.0000 g | INTRAVENOUS | Status: AC
  Administered 2021-12-29: 2 g via INTRAVENOUS
  Filled 2021-12-29: qty 100

## 2021-12-29 MED ORDER — ONDANSETRON HCL 4 MG/2ML IJ SOLN: 4.0000 mg | Freq: Four times a day (QID) | INTRAMUSCULAR | Status: DC | PRN

## 2021-12-29 MED ORDER — LIDOCAINE 2% (20 MG/ML) 5 ML SYRINGE
INTRAMUSCULAR | Status: DC | PRN
  Administered 2021-12-29: 100 mg via INTRAVENOUS

## 2021-12-29 MED ORDER — HYDROCODONE-ACETAMINOPHEN 5-325 MG PO TABS: 1.0000 | ORAL_TABLET | ORAL | Status: DC | PRN

## 2021-12-29 MED ORDER — GLIMEPIRIDE 2 MG PO TABS
1.0000 mg | ORAL_TABLET | Freq: Every day | ORAL | Status: DC
Start: 2021-12-30 — End: 2021-12-30

## 2021-12-29 MED ORDER — BUPIVACAINE HCL (PF) 0.25 % IJ SOLN
INTRAMUSCULAR | Status: AC
  Filled 2021-12-29: qty 30

## 2021-12-29 SURGICAL SUPPLY — 49 items
BAG COUNTER SPONGE SURGICOUNT (BAG) ×2 IMPLANT
BAG DECANTER FOR FLEXI CONT (MISCELLANEOUS) ×2 IMPLANT
BAND RUBBER #18 3X1/16 STRL (MISCELLANEOUS) ×4 IMPLANT
BENZOIN TINCTURE PRP APPL 2/3 (GAUZE/BANDAGES/DRESSINGS) ×2 IMPLANT
BLADE CLIPPER SURG (BLADE) IMPLANT
BUR CUTTER 7.0 ROUND (BURR) ×2 IMPLANT
CANISTER SUCT 3000ML PPV (MISCELLANEOUS) ×2 IMPLANT
CARTRIDGE OIL MAESTRO DRILL (MISCELLANEOUS) ×1 IMPLANT
DERMABOND ADHESIVE PROPEN (GAUZE/BANDAGES/DRESSINGS) ×1
DERMABOND ADVANCED (GAUZE/BANDAGES/DRESSINGS) ×1
DERMABOND ADVANCED .7 DNX12 (GAUZE/BANDAGES/DRESSINGS) ×1 IMPLANT
DERMABOND ADVANCED .7 DNX6 (GAUZE/BANDAGES/DRESSINGS) IMPLANT
DIFFUSER DRILL AIR PNEUMATIC (MISCELLANEOUS) ×2 IMPLANT
DRAPE HALF SHEET 40X57 (DRAPES) IMPLANT
DRAPE LAPAROTOMY 100X72X124 (DRAPES) ×2 IMPLANT
DRAPE MICROSCOPE LEICA (MISCELLANEOUS) ×2 IMPLANT
DRAPE SURG 17X23 STRL (DRAPES) ×4 IMPLANT
DURAPREP 26ML APPLICATOR (WOUND CARE) ×2 IMPLANT
ELECT REM PT RETURN 9FT ADLT (ELECTROSURGICAL) ×2
ELECTRODE REM PT RTRN 9FT ADLT (ELECTROSURGICAL) ×1 IMPLANT
GAUZE 4X4 16PLY ~~LOC~~+RFID DBL (SPONGE) IMPLANT
GAUZE SPONGE 4X4 12PLY STRL (GAUZE/BANDAGES/DRESSINGS) ×2 IMPLANT
GLOVE BIO SURGEON STRL SZ 6.5 (GLOVE) ×2 IMPLANT
GLOVE BIOGEL PI IND STRL 6.5 (GLOVE) ×1 IMPLANT
GLOVE BIOGEL PI INDICATOR 6.5 (GLOVE) ×1
GLOVE ECLIPSE 9.0 STRL (GLOVE) ×2 IMPLANT
GLOVE EXAM NITRILE XL STR (GLOVE) IMPLANT
GOWN STRL REUS W/ TWL LRG LVL3 (GOWN DISPOSABLE) IMPLANT
GOWN STRL REUS W/ TWL XL LVL3 (GOWN DISPOSABLE) ×1 IMPLANT
GOWN STRL REUS W/TWL 2XL LVL3 (GOWN DISPOSABLE) IMPLANT
GOWN STRL REUS W/TWL LRG LVL3 (GOWN DISPOSABLE)
GOWN STRL REUS W/TWL XL LVL3 (GOWN DISPOSABLE) ×2
KIT BASIN OR (CUSTOM PROCEDURE TRAY) ×2 IMPLANT
KIT TURNOVER KIT B (KITS) ×2 IMPLANT
NDL SPNL 22GX3.5 QUINCKE BK (NEEDLE) IMPLANT
NEEDLE HYPO 22GX1.5 SAFETY (NEEDLE) ×2 IMPLANT
NEEDLE SPNL 22GX3.5 QUINCKE BK (NEEDLE) IMPLANT
NS IRRIG 1000ML POUR BTL (IV SOLUTION) ×2 IMPLANT
OIL CARTRIDGE MAESTRO DRILL (MISCELLANEOUS) ×2
PACK LAMINECTOMY NEURO (CUSTOM PROCEDURE TRAY) ×2 IMPLANT
PAD ARMBOARD 7.5X6 YLW CONV (MISCELLANEOUS) ×6 IMPLANT
SPIKE FLUID TRANSFER (MISCELLANEOUS) ×2 IMPLANT
SPONGE SURGIFOAM ABS GEL SZ50 (HEMOSTASIS) ×3 IMPLANT
STRIP CLOSURE SKIN 1/2X4 (GAUZE/BANDAGES/DRESSINGS) ×2 IMPLANT
SUT VIC AB 2-0 CT1 18 (SUTURE) ×2 IMPLANT
SUT VIC AB 3-0 SH 8-18 (SUTURE) ×2 IMPLANT
TOWEL GREEN STERILE (TOWEL DISPOSABLE) ×2 IMPLANT
TOWEL GREEN STERILE FF (TOWEL DISPOSABLE) ×2 IMPLANT
WATER STERILE IRR 1000ML POUR (IV SOLUTION) ×2 IMPLANT

## 2021-12-29 NOTE — Op Note (Signed)
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  Asst. Surgeon: Reinaldo Meeker, NP  Anesthesia: General  Indication: 71 year old male with left lower extremity radicular pain numbness and weakness failing conservative management.  Symptoms cyst with a left-sided L4 radiculopathy.  Work-up demonstrates evidence of foraminal collapse with a significant foraminal and extraforaminal disc herniation causing compression of the left L4 nerve root.  Patient presents now for decompressive surgery in hopes of improving his symptoms.  Operative note: After induction of anesthesia, patient position prone on the Wilson frame and properly padded.  Lumbar region prepped and draped sterilely.  Incision made overlying L4-5.  Dissection performed on the left.  Retractor placed.  X-ray taken.  Level confirmed.  Extraforaminal approach was then performed using high-speed drill and Kerrison rongeurs to remove the superior aspect of the superior articular process of L5 and the lateral aspect of the pars interarticularis.  Intertransverse ligament was elevated and resected.  Microscope brought in field used micro section of the extraforaminal space.  Perineural venous plexus was coagulated and cut.  The left L4 nerve root was identified.  Disc space was identified.  Foraminotomies was completed on the course the exiting L4 nerve root by undercutting the residual superior articular process of L5 and removing ligament from within the canal and foramen.  The disc space was moderately herniated with compression of the left L4 nerve root.  The disc space was entered with a nerve hook and a number of small fragments of disc were removed.  This  created good decompression of the nerve root.  There was no evidence of injury to the nerve root or thecal sac.  Wound was then irrigated.  Gelfoam was placed topically for hemostasis.  Wound is then closed in layers with Vicryl sutures.  Steri-Strips and sterile dressing were applied.  No apparent complications.  Patient tolerated the procedure well and he returns to the recovery room postop.

## 2021-12-29 NOTE — Evaluation (Signed)
Occupational Therapy Evaluation Patient Details Name: [name] MRN: [id number] DOB: [birth year] Today's Date: 12/29/2021   History of Present Illness 71 yo male s/p L4-5 extraforaminal microdiscectomy and foraminotomy on 7/10. PMH afib, DM type 2, HTN, stroke, and prior back sx.   Clinical Impression   PTA, pt was living with his wife and was independent. Currently, pt performing ADLs and functional mobility at Mod I level with increased time; use of RW in hallway for comfort. Provided education and handout on back precautions, bed mobility, LB ADLs, toileting, and tub transfer; pt demonstrated understanding. Answered all pt questions. Recommend dc home once medically stable per physician. All acute OT needs met and will sign off. Thank you.    Recommendations for follow up therapy are one component of a multi-disciplinary discharge planning process, led by the attending physician.  Recommendations may be updated based on patient status, additional functional criteria and insurance authorization.   Follow Up Recommendations  No OT follow up    Assistance Recommended at Discharge PRN  Patient can return home with the following      Functional Status Assessment  Patient has had a recent decline in their functional status and demonstrates the ability to make significant improvements in function in a reasonable and predictable amount of time.  Equipment Recommendations  None recommended by OT    Recommendations for Other Services       Precautions / Restrictions Precautions Precautions: Back Precaution Booklet Issued: Yes (comment) Precaution Comments: Reviewed precautions Required Braces or Orthoses: Other Brace Other Brace: No brace per MD Restrictions Weight Bearing Restrictions: No      Mobility Bed Mobility Overal bed mobility: Modified Independent             General bed mobility comments: Increased time. Use of log roll    Transfers Overall transfer  level: Modified independent                        Balance Overall balance assessment: No apparent balance deficits (not formally assessed) (use of RW for comfort in hallway; did not need for balance during ADLs and in room)                                         ADL either performed or assessed with clinical judgement   ADL Overall ADL's : Modified independent                                       General ADL Comments: Requiring increased time for adherance to precautions. Providing education and handout on back precautions, bed mobility, grooming, LB ADLs, toileting, and tub transfer     Vision Baseline Vision/History: 1 Wears glasses       Perception     Praxis      Pertinent Vitals/Pain Pain Assessment Pain Assessment: No/denies pain     Hand Dominance Right   Extremity/Trunk Assessment Upper Extremity Assessment Upper Extremity Assessment: Overall WFL for tasks assessed   Lower Extremity Assessment Lower Extremity Assessment: Overall WFL for tasks assessed   Cervical / Trunk Assessment Cervical / Trunk Assessment: Back Surgery   Communication Communication Communication: No difficulties   Cognition Arousal/Alertness: Awake/alert Behavior During Therapy: WFL for tasks assessed/performed Overall Cognitive Status: Within Functional Limits for tasks  assessed                                       General Comments  wife present    Exercises     Shoulder Instructions      Home Living Family/patient expects to be discharged to:: Private residence Living Arrangements: Spouse/significant other Available Help at Discharge: Family;Available 24 hours/day Type of Home: House Home Access: Stairs to enter [employer] of Steps: 1 Entrance Stairs-Rails: None Home Layout: One level     Bathroom Shower/Tub: Tub/shower unit;Walk-in shower   Bathroom Toilet: Handicapped height     Home  Equipment: Conservation officer, nature (2 wheels);Cane - single point;Shower seat;BSC/3in1          Prior Functioning/Environment Prior Level of Function : Independent/Modified Independent                        OT Problem List: Decreased strength;Decreased range of motion;Decreased activity tolerance;Decreased knowledge of use of DME or AE;Decreased knowledge of precautions      OT Treatment/Interventions:      OT Goals(Current goals can be found in the care plan section) Acute Rehab OT Goals Patient Stated Goal: Go home today OT Goal Formulation: All assessment and education complete, DC therapy  OT Frequency:      Co-evaluation              AM-PAC OT "6 Clicks" Daily Activity     Outcome Measure Help from another person eating meals?: None Help from another person taking care of personal grooming?: None Help from another person toileting, which includes using toliet, bedpan, or urinal?: None Help from another person bathing (including washing, rinsing, drying)?: None Help from another person to put on and taking off regular upper body clothing?: None Help from another person to put on and taking off regular lower body clothing?: None 6 Click Score: 24   End of Session Equipment Utilized During Treatment: Rolling walker (2 wheels) Nurse Communication: Mobility status  Activity Tolerance: Patient tolerated treatment well Patient left: in bed;with call bell/phone within reach;with family/visitor present  OT Visit Diagnosis: Muscle weakness (generalized) (M62.81);Pain                Time: 0992-7800 OT Time Calculation (min): 9 min Charges:  OT General Charges $OT Visit: 1 Visit OT Evaluation $OT Eval Low Complexity: 1 Low  Aerilyn Slee MSOT, OTR/L Acute Rehab Office: [place] 12/29/2021, 5:02 PM

## 2021-12-29 NOTE — Anesthesia Procedure Notes (Signed)
Procedure Name: Intubation Date/Time: 12/29/2021 11:59 AM  Performed by: Merryl Hacker, RNPre-anesthesia Checklist: Patient identified, Patient being monitored, Timeout performed, Emergency Drugs available and Suction available Patient Re-evaluated:Patient Re-evaluated prior to induction Oxygen Delivery Method: Circle System Utilized Preoxygenation: Pre-oxygenation with 100% oxygen Induction Type: IV induction Ventilation: Mask ventilation without difficulty Laryngoscope Size: Mac and 4 Grade View: Grade I Tube type: Oral Tube size: 7.5 mm Number of attempts: 1 Airway Equipment and Method: Stylet Placement Confirmation: ETT inserted through vocal cords under direct vision, positive ETCO2 and breath sounds checked- equal and bilateral Secured at: 23 cm Tube secured with: Tape Dental Injury: Teeth and Oropharynx as per pre-operative assessment  Comments: Preoxygenated with 100% FiO2. Gentle DL with grade 1 view. ETT passed through cords. +BBS, +EtCO2, +chest rise and fall.  Performed by Heide Scales, SRNA

## 2021-12-29 NOTE — Progress Notes (Signed)
Blood consent tubed down to blood bank. Angela in blood bank aware.

## 2021-12-29 NOTE — Progress Notes (Signed)
Patient alert and oriented, mae's well, voiding adequate amount of urine, swallowing without difficulty, no c/o pain at time of discharge. Patient discharged home with family. Script and discharged instructions given to patient. Patient and family stated understanding of instructions given. Patient has an appointment with Dr. Pool in 2 weeks 

## 2021-12-29 NOTE — Discharge Instructions (Addendum)
Wound Care Keep incision covered and dry for three days.   Do not put any creams, lotions, or ointments on incision. Leave steri-strips on back.  They will fall off by themselves. Activity Walk each and every day, increasing distance each day. No lifting greater than 5 lbs.  Avoid excessive neck motion. No driving for 2 weeks; may ride as a passenger locally.  Diet Resume your normal diet.   Call Your Doctor If Any of These Occur Redness, drainage, or swelling at the wound.  Temperature greater than 101 degrees. Severe pain not relieved by pain medication. Incision starts to come apart. Follow Up Appt Call  [phone])  for problems.    [place]

## 2021-12-29 NOTE — Plan of Care (Signed)

## 2021-12-29 NOTE — H&P (Signed)
[name] is an 71 y.o. male.   Chief Complaint: Left leg pain HPI: 71 year old male with left lower extremity radicular pain consistent with a left-sided L4 radiculopathy with associated sensory loss and weakness.  Symptoms have failed conservative management.  Work-up demonstrates evidence of significant disc space collapse with severe foraminal stenosis with an associated extraforaminal disc bulge/herniation.  Patient presents now for extraforaminal microdiscectomy and foraminotomies on the left at L4-5 in hopes of improving his symptoms.  Patient is remotely status post right-sided extraforaminal microdiscectomy few years ago with good results.  Past Medical History:  Diagnosis Date   Abscess of anal and rectal regions 07/15/2007   Qualifier: Diagnosis of  By: Amil Amen MD, Elizabeth     Atrial fibrillation [facility])    Diabetes mellitus without complication ([place]) 6378   type 2   FISTULA, ANAL 01/27/2007   Qualifier: Diagnosis of  By: Amil Amen MD, Elizabeth     GERD (gastroesophageal reflux disease)    Hypertension Dx 2008   Prostate cancer ([place])    Spontaneous pneumothorax    Stroke [facility])     Past Surgical History:  Procedure Laterality Date   CATARACT EXTRACTION  06/2017   CYSTOSCOPY  05/2017   INTERCOSTAL NERVE BLOCK Left 05/13/2020   Procedure: INTERCOSTAL NERVE BLOCK;  Surgeon: Lajuana Matte, MD;  Location: [facility] OR;  Service: Thoracic;  Laterality: Left;   LUMBAR LAMINECTOMY/DECOMPRESSION MICRODISCECTOMY Right 03/11/2021   Procedure: Microdiscectomy - right - Lumbar four-Lumbar five extraforaminal;  Surgeon: Earnie Larsson, MD;  Location: [facility];  Service: Neurosurgery;  Laterality: Right;   PROSTATE BIOPSY     WEDGE RESECTION Left 05/13/2020   XI robotic assisted thorascopy    Family History  Problem Relation Age of Onset   Diabetes Mother    Breast cancer Mother    Diabetes Father    Breast cancer Sister    Prostate cancer Neg Hx    Colon cancer Neg Hx     Pancreatic cancer Neg Hx    Social History:  reports that he has been smoking cigarettes. He has a 2.50 pack-year smoking history. He has never used smokeless tobacco. He reports current alcohol use of about 1.0 - 2.0 standard drink of alcohol per week. He reports that he does not use drugs.  Allergies:  Allergies  Allergen Reactions   Lisinopril Swelling    Throat swelling   Tramadol Hcl     "Makes me crazy"    Medications Prior to Admission  Medication Sig Dispense Refill   amLODipine (NORVASC) 10 MG tablet Take 1 tablet (10 mg total) by mouth daily. 90 tablet 1   apixaban (ELIQUIS) 5 MG TABS tablet TAKE 1 TABLET BY MOUTH TWICE DAILY. 60 tablet 3   B Complex Vitamins (VITAMIN B COMPLEX) TABS Take 1 tablet by mouth daily.     diclofenac Sodium (VOLTAREN) 1 % GEL Apply 2 g topically 3 (three) times daily as needed (pain). 100 g 0   fluticasone (FLONASE) 50 MCG/ACT nasal spray Place 1 spray into both nostrils daily as needed for allergies or rhinitis.     gabapentin (NEURONTIN) 100 MG capsule TAKE 1 CAPSULE (100 MG TOTAL) BY MOUTH 3 (THREE) TIMES DAILY. NEEDED FOR NUMBNESS 90 capsule 1   glimepiride (AMARYL) 1 MG tablet Take 1 tablet (1 mg total) by mouth daily with breakfast. 30 tablet 3   HYDROcodone-acetaminophen (NORCO) 10-325 MG tablet Take 1 tablet by mouth every 6 (six) hours as needed for pain.  hydrOXYzine (VISTARIL) 25 MG capsule Take 1 capsule (25 mg total) by mouth 2 (two) times daily as needed. 30 capsule 3   latanoprost (XALATAN) 0.005 % ophthalmic solution Place 1 drop into both eyes nightly. 2.5 mL 3   latanoprost (XALATAN) 0.005 % ophthalmic solution Place 1 drop into both eyes nightly. 2.5 mL 3   Multiple Vitamin (MULTIVITAMIN WITH MINERALS) TABS tablet Take 1 tablet by mouth daily.     pravastatin (PRAVACHOL) 10 MG tablet Take 1 tablet (10 mg total) by mouth daily. 90 tablet 1   sennosides-docusate sodium (SENOKOT-S) 8.6-50 MG tablet Take 1 tablet by mouth daily. 15  tablet 0   methocarbamol (ROBAXIN) 750 MG tablet Take 1 tablet (750 mg total) by mouth 3 (three) times daily as needed (muscle spasm/pain). 15 tablet 0   prochlorperazine (COMPAZINE) 10 MG tablet Take 1 tablet (10 mg total) by mouth every 6 (six) hours as needed for nausea or vomiting. 30 tablet 0   tamsulosin (FLOMAX) 0.4 MG CAPS capsule Take 1 capsule (0.4 mg total) by mouth daily after supper. 30 capsule 5    Results for orders placed or performed during the hospital encounter of 12/29/21 (from the past 48 hour(s))  Glucose, capillary     Status: Abnormal   Collection Time: 12/29/21 10:18 AM  Result Value Ref Range   Glucose-Capillary 156 (H) 70 - 99 mg/dL    Comment: Glucose reference range applies only to samples taken after fasting for at least 8 hours.  CBC     Status: Abnormal   Collection Time: 12/29/21 11:08 AM  Result Value Ref Range   WBC 3.7 (L) 4.0 - 10.5 K/uL   RBC 4.42 4.22 - 5.81 MIL/uL   Hemoglobin 13.4 13.0 - 17.0 g/dL   HCT 39.5 39.0 - 52.0 %   MCV 89.4 80.0 - 100.0 fL   MCH 30.3 26.0 - 34.0 pg   MCHC 33.9 30.0 - 36.0 g/dL   RDW 15.0 11.5 - 15.5 %   Platelets 225 150 - 400 K/uL   nRBC 0.0 0.0 - 0.2 %    Comment: Performed at [facility], [place] [address], [place], [place] [address]   No results found.  Pertinent items noted in HPI and remainder of comprehensive ROS otherwise negative.  Blood pressure (!) 146/94, pulse 60, temperature 97.7 F (36.5 C), temperature source Oral, resp. rate 17, height '6\' 1"'$  (1.854 m), weight 86.2 kg, SpO2 98 %.  Patient is awake and alert.  He is oriented and appropriate.  Speech is fluent.  Judgment insight are intact.  Cranial nerve function normal lateral motor examination reveals significant weakness of his left quadriceps muscle group grading at 4-/5.  He has decreased sensation in his left L4 dermatome.  Deep Temrex is normal active scepter his patellar reflexes are hypoactive bilaterally and his Achilles reflexes are  absent.  Gait is antalgic.  Posture is mildly flexed.  Examination head ears eyes note and throat are unremarkable.  Chest and abdomen are benign.  Extremities are free from injury or deformity. Assessment/Plan Left L4-5 extraforaminal disc herniation and stenosis with radiculopathy.  Plan left L4-5 extraforaminal microdiscectomy and foraminotomy in hopes improving his symptoms.  I discussed the risks and benefits involved with surgery.  Patient has been given the option as questions and appears to understand.  He wishes to proceed with surgery.  Mallie Mussel A Omeed Osuna 12/29/2021, 11:21 AM

## 2021-12-29 NOTE — Brief Op Note (Signed)
12/29/2021  1:03 PM  PATIENT:  Dyllin C Staff  71 y.o. male  PRE-OPERATIVE DIAGNOSIS:  Herniated nucleus pulposus lumbar  POST-OPERATIVE DIAGNOSIS:  Herniated nucleus pulposus lumbar  PROCEDURE:  Procedure(s) with comments: Extraforaminal microdiscectomy - left - L4-L5 (Left) - 3C  SURGEON:  Surgeon(s) and Role:    Earnie Larsson, MD - Primary  PHYSICIAN ASSISTANT:   ASSISTANTSMearl Latin   ANESTHESIA:   general  EBL:  Minimal   BLOOD ADMINISTERED:none  DRAINS: none   LOCAL MEDICATIONS USED:  MARCAINE     SPECIMEN:  No Specimen  DISPOSITION OF SPECIMEN:  N/A  COUNTS:  YES  TOURNIQUET:  * No tourniquets in log *  DICTATION: .Dragon Dictation  PLAN OF CARE: Admit for overnight observation  PATIENT DISPOSITION:  PACU - hemodynamically stable.   Delay start of Pharmacological VTE agent (>24hrs) due to surgical blood loss or risk of bleeding: yes

## 2021-12-29 NOTE — Transfer of Care (Signed)
Immediate Anesthesia Transfer of Care Note  Patient: [name]  Procedure(s) Performed: Extraforaminal microdiscectomy - left - L4-L5 (Left: Back)  Patient Location: PACU  Anesthesia Type:General  Level of Consciousness: awake, alert , patient cooperative and responds to stimulation  Airway & Oxygen Therapy: Patient Spontanous Breathing and Patient connected to face mask oxygen  Post-op Assessment: Report given to RN, Post -op Vital signs reviewed and stable and Patient moving all extremities X 4  Post vital signs: Reviewed and stable  Last Vitals:  Vitals Value Taken Time  BP 166/97 12/29/21 1319  Temp    Pulse 79 12/29/21 1323  Resp 15 12/29/21 1323  SpO2 99 % 12/29/21 1323  Vitals shown include unvalidated device data.  Last Pain:  Vitals:   12/29/21 1016  TempSrc: Oral         Complications: No notable events documented.

## 2021-12-29 NOTE — Discharge Summary (Signed)
Physician Discharge Summary  Patient ID: [name] MRN: [id number] DOB/AGE: 1951-04-09 71 y.o.  Admit date: 12/29/2021 Discharge date: 12/29/2021  Admission Diagnoses:  Discharge Diagnoses:  Principal Problem:   Lumbar radiculopathy   Discharged Condition: good  Hospital Course: Patient admitted to the hospital where he underwent uncomplicated left-sided Q2-2 extraforaminal microdiscectomy.  Postoperatively doing very well.  Preoperative back and left lower extremity pain resolved.  Standing ambulating and voiding without difficulty.  Patient feels ready for discharge.  Consults:   Significant Diagnostic Studies:   Treatments:   Discharge Exam: Blood pressure (!) 167/86, pulse (!) 59, temperature 98 F (36.7 C), resp. rate 18, height '6\' 1"'$  (1.854 m), weight 86.2 kg, SpO2 98 %. Awake and alert.  Oriented and appropriate.  Motor and sensory function intact.  Wound clean and dry.  Chest and abdomen benign.  Disposition: Discharge disposition: 01-Home or Self Care        Allergies as of 12/29/2021       Reactions   Lisinopril Swelling   Throat swelling   Tramadol Hcl    "Makes me crazy"        Medication List     TAKE these medications    amLODipine 10 MG tablet Commonly known as: NORVASC Take 1 tablet (10 mg total) by mouth daily.   diclofenac Sodium 1 % Gel Commonly known as: Voltaren Apply 2 g topically 3 (three) times daily as needed (pain).   Eliquis 5 MG Tabs tablet Generic drug: apixaban TAKE 1 TABLET BY MOUTH TWICE DAILY.   fluticasone 50 MCG/ACT nasal spray Commonly known as: FLONASE Place 1 spray into both nostrils daily as needed for allergies or rhinitis.   gabapentin 100 MG capsule Commonly known as: NEURONTIN TAKE 1 CAPSULE (100 MG TOTAL) BY MOUTH 3 (THREE) TIMES DAILY. NEEDED FOR NUMBNESS   glimepiride 1 MG tablet Commonly known as: AMARYL Take 1 tablet (1 mg total) by mouth daily with breakfast.   HYDROcodone-acetaminophen  10-325 MG tablet Commonly known as: NORCO Take 1 tablet by mouth every 6 (six) hours as needed. What changed: reasons to take this   hydrOXYzine 25 MG capsule Commonly known as: VISTARIL Take 1 capsule (25 mg total) by mouth 2 (two) times daily as needed.   latanoprost 0.005 % ophthalmic solution Commonly known as: XALATAN Place 1 drop into both eyes nightly.   latanoprost 0.005 % ophthalmic solution Commonly known as: XALATAN Place 1 drop into both eyes nightly.   methocarbamol 750 MG tablet Commonly known as: ROBAXIN Take 1 tablet (750 mg total) by mouth 3 (three) times daily as needed (muscle spasm/pain).   multivitamin with minerals Tabs tablet Take 1 tablet by mouth daily.   pravastatin 10 MG tablet Commonly known as: PRAVACHOL Take 1 tablet (10 mg total) by mouth daily.   prochlorperazine 10 MG tablet Commonly known as: COMPAZINE Take 1 tablet (10 mg total) by mouth every 6 (six) hours as needed for nausea or vomiting.   sennosides-docusate sodium 8.6-50 MG tablet Commonly known as: SENOKOT-S Take 1 tablet by mouth daily.   tamsulosin 0.4 MG Caps capsule Commonly known as: Flomax Take 1 capsule (0.4 mg total) by mouth daily after supper.   Vitamin B Complex Tabs Take 1 tablet by mouth daily.        Follow-up Information     Scott Larsson, MD. Call.   Specialty: Neurosurgery Why: As needed, If symptoms worsen Contact information: [address] [place] [address] [place] [address] [phone]  SignedCooper Travis [name] 12/29/2021, 5:24 PM

## 2021-12-30 ENCOUNTER — Telehealth: Payer: Self-pay

## 2021-12-30 ENCOUNTER — Encounter (HOSPITAL_COMMUNITY): Payer: Self-pay | Admitting: Neurosurgery

## 2021-12-30 NOTE — Telephone Encounter (Signed)
Transition Care Management Follow-up Telephone Call Date of discharge and from where: 12/29/2021, [facility]  How have you been since you were released from the hospital? He said he is just resting  Any questions or concerns? Yes- he said he is waiting for a call back from Dr Marchelle Folks office about pain medication.  He stated he was told that the hydrocodone would be called in to his pharmacy but his pharmacy has not received an order yet, so he called and left a message for Dr Annette Stable.   Items Reviewed: Did the pt receive and understand the discharge instructions provided? Yes  Medications obtained and verified?  He said he has his medications except for the pain med as noted above.  Other? No  Any new allergies since your discharge? No  Dietary orders reviewed? No Do you have support at home? Yes   Home Care and Equipment/Supplies: Were home health services ordered? no If so, what is the name of the agency? N/a  Has the agency set up a time to come to the patient's home? N/a Were any new equipment or medical supplies ordered?  No What is the name of the medical supply agency? N/a Were you able to get the supplies/equipment? not applicable Do you have any questions related to the use of the equipment or supplies? No  Functional Questionnaire: (I = Independent and D = Dependent) ADLs: has RW to use with ambulation. Independent with personal care.   Follow up appointments reviewed:  PCP Hospital f/u appt confirmed?  Scheduled to see Dr [name] - 02/27/2022.  Instructed him to call the clinic if he feels he needs to be seen sooner.    [facility] f/u appt confirmed? Yes  - he said he has a follow up appointment with Dr Annette Stable but he did not remember the date .  Are transportation arrangements needed? No  If their condition worsens, is the pt aware to call PCP or go to the Emergency Dept.? Yes Was the patient provided with contact information for the PCP's office or ED? Yes Was to  pt encouraged to call back with questions or concerns? Yes

## 2021-12-30 NOTE — Anesthesia Postprocedure Evaluation (Signed)
Anesthesia Post Note  Patient: [name]  Procedure(s) Performed: Extraforaminal microdiscectomy - left - L4-L5 (Left: Back)     Patient location during evaluation: PACU Anesthesia Type: General Level of consciousness: awake and alert Pain management: pain level controlled Vital Signs Assessment: post-procedure vital signs reviewed and stable Respiratory status: spontaneous breathing, nonlabored ventilation, respiratory function stable and patient connected to nasal cannula oxygen Cardiovascular status: blood pressure returned to baseline and stable Postop Assessment: no apparent nausea or vomiting Anesthetic complications: no   No notable events documented.  Last Vitals:  Vitals:   12/29/21 1400 12/29/21 1448  BP: (!) 146/81 (!) 167/86  Pulse: (!) 58 (!) 59  Resp: 19 18  Temp: 36.7 C 36.7 C  SpO2: 92% 98%    Last Pain:  Vitals:   12/29/21 1455  TempSrc:   PainSc: 3                  Santa Lighter

## 2022-02-06 DIAGNOSIS — C61 Malignant neoplasm of prostate: Secondary | ICD-10-CM | POA: Diagnosis not present

## 2022-02-13 DIAGNOSIS — C61 Malignant neoplasm of prostate: Secondary | ICD-10-CM | POA: Diagnosis not present

## 2022-02-27 ENCOUNTER — Ambulatory Visit: Payer: Medicare HMO | Admitting: Internal Medicine

## 2022-03-06 ENCOUNTER — Other Ambulatory Visit: Payer: Self-pay

## 2022-03-10 ENCOUNTER — Encounter: Payer: Self-pay | Admitting: Podiatry

## 2022-03-10 ENCOUNTER — Other Ambulatory Visit: Payer: Self-pay

## 2022-03-10 ENCOUNTER — Ambulatory Visit (INDEPENDENT_AMBULATORY_CARE_PROVIDER_SITE_OTHER): Payer: Medicare HMO | Admitting: Podiatry

## 2022-03-10 DIAGNOSIS — M79675 Pain in left toe(s): Secondary | ICD-10-CM | POA: Diagnosis not present

## 2022-03-10 DIAGNOSIS — B351 Tinea unguium: Secondary | ICD-10-CM | POA: Diagnosis not present

## 2022-03-10 DIAGNOSIS — D689 Coagulation defect, unspecified: Secondary | ICD-10-CM

## 2022-03-10 DIAGNOSIS — E1149 Type 2 diabetes mellitus with other diabetic neurological complication: Secondary | ICD-10-CM

## 2022-03-10 DIAGNOSIS — M79674 Pain in right toe(s): Secondary | ICD-10-CM | POA: Diagnosis not present

## 2022-03-10 NOTE — Progress Notes (Signed)
This patient returns to my office for at risk foot care.  This patient requires this care by a professional since this patient will be at risk due to having diabetes.  This patient is unable to cut nails himself since the patient cannot reach his nails.These nails are painful walking and wearing shoes.  This patient presents for at risk foot care today.  General Appearance  Alert, conversant and in no acute stress.  Vascular  Dorsalis pedis and posterior tibial  pulses are palpable  bilaterally.  Capillary return is within normal limits  bilaterally. Temperature is within normal limits  bilaterally.  Neurologic  Senn-Weinstein monofilament wire test diminished bilaterally. Muscle power within normal limits bilaterally.  Nails Thick disfigured discolored nails with subungual debris  from hallux to fifth toes bilaterally. No evidence of bacterial infection or drainage bilaterally.  Orthopedic  No limitations of motion  feet .  No crepitus or effusions noted.  No bony pathology or digital deformities noted.  Plantar flexed fifth metatarsal  B/L.  Skin  normotropic skin with no porokeratosis noted bilaterally.  No signs of infections or ulcers noted.   Asymptomatic callus sub 5th  B/l.  Onychomycosis  Pain in right toes  Pain in left toes    Consent was obtained for treatment procedures.   Mechanical debridement of nails 1-5  bilaterally performed with a nail nipper.  Filed with dremel without incident.    Return office visit   3 months                  Told patient to return for periodic foot care and evaluation due to potential at risk complications.   Montario Zilka DPM  

## 2022-03-16 ENCOUNTER — Other Ambulatory Visit: Payer: Self-pay

## 2022-03-26 ENCOUNTER — Other Ambulatory Visit: Payer: Self-pay

## 2022-03-31 ENCOUNTER — Ambulatory Visit: Payer: Medicare HMO | Attending: Internal Medicine | Admitting: Internal Medicine

## 2022-03-31 ENCOUNTER — Encounter: Payer: Self-pay | Admitting: Internal Medicine

## 2022-03-31 VITALS — BP 122/80 | HR 89 | Ht 73.0 in | Wt 190.2 lb

## 2022-03-31 DIAGNOSIS — I480 Paroxysmal atrial fibrillation: Secondary | ICD-10-CM

## 2022-03-31 DIAGNOSIS — I152 Hypertension secondary to endocrine disorders: Secondary | ICD-10-CM | POA: Diagnosis not present

## 2022-03-31 DIAGNOSIS — Z23 Encounter for immunization: Secondary | ICD-10-CM | POA: Diagnosis not present

## 2022-03-31 DIAGNOSIS — E1159 Type 2 diabetes mellitus with other circulatory complications: Secondary | ICD-10-CM

## 2022-03-31 DIAGNOSIS — E1149 Type 2 diabetes mellitus with other diabetic neurological complication: Secondary | ICD-10-CM | POA: Diagnosis not present

## 2022-03-31 LAB — POCT GLYCOSYLATED HEMOGLOBIN (HGB A1C): HbA1c, POC (controlled diabetic range): 7 % (ref 0.0–7.0)

## 2022-03-31 MED ORDER — ZOSTER VAC RECOMB ADJUVANTED 50 MCG/0.5ML IM SUSR: 0.5000 mL | Freq: Once | INTRAMUSCULAR | 0 refills | Status: AC

## 2022-03-31 NOTE — Progress Notes (Signed)
shing   Patient ID: [name], male    DOB: 1951-03-26  MRN: [id number]  CC: chronic ds management  Subjective: [name] is a 71 y.o. 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 cocaine abuse in remission since 2005, LT pneumothorax, POAG( BL glaucoma).   Hx of prostate CA:  PSA 1.3 last mth with urologist. Completed XRT in 08/2021. Received one hormone inj and decided against any further shots due to S.E.  HTN/a.fib:  taking Norvasc and limits salt.  No chest pains or shortness of breath.  No palpitations. No bruising or bleeding on Eliquis.  DM:  Lab Results  Component Value Date   HGBA1C 7.0 03/31/2022   taking Amaryl Checks BS 3x/wk.  Range low 100s.  No lows Doing well with eating habits.  Gets $40 of free fruits and veggies every mth.  Does his own cooking.  HL:  takign and tolerating Pravachol  HM:  needs flu and shingles vaccine.  Will have COVID booster later this wk at [facility].  Last routine eye exam by Dr. Wyvonnia Dusky 08/07/2021.  Has glaucoma BL  Patient Active Problem List   Diagnosis Date Noted   Lumbar radiculopathy 03/11/2021   Plantar flexed metatarsal, left 07/30/2020   Plantar flexed metatarsal, right 07/30/2020   Tobacco dependence 05/27/2020   History of pneumothorax 05/27/2020   Pneumothorax 05/10/2020   Blood clotting disorder ([place]) 04/23/2020   Stressful life event affecting family 01/29/2020   Hyperlipidemia associated with type 2 diabetes mellitus ([place]) 01/29/2020   Personal history of fall 01/29/2020   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   Unintended weight loss 01/29/2020   Pain due to onychomycosis of toenails of both feet 08/01/2019   Porokeratosis 08/01/2019   Influenza vaccine needed 02/24/2019   Diabetes mellitus ([place]) 02/24/2019   Malignant neoplasm of  prostate ([place]) 09/23/2018   Hyperlipemia 11/24/2016   History of CVA with residual deficit 11/24/2016   GERD (gastroesophageal reflux disease) 08/09/2014   Atrial fibrillation ([place])    Hemiparesis affecting left side as late effect of stroke ([place]) 05/01/2014   ERECTILE DYSFUNCTION, 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 90 tablet 1   apixaban (ELIQUIS) 5 MG TABS tablet TAKE 1 TABLET BY MOUTH TWICE DAILY. 60 tablet 3   B Complex Vitamins (VITAMIN B COMPLEX) TABS Take 1 tablet by mouth daily.     diclofenac Sodium (VOLTAREN) 1 % GEL Apply 2 g topically 3 (three) times daily as needed (pain). 100 g 0   fluticasone (FLONASE) 50 MCG/ACT nasal spray Place 1 spray into both nostrils daily as needed for allergies or rhinitis.     gabapentin (NEURONTIN) 100 MG capsule TAKE 1 CAPSULE (100 MG TOTAL) BY MOUTH 3 (THREE) TIMES DAILY. NEEDED FOR NUMBNESS 90 capsule 1   glimepiride (AMARYL) 1 MG tablet Take 1 tablet (1 mg total) by mouth daily with breakfast. 30 tablet 3   HYDROcodone-acetaminophen (NORCO) 10-325 MG tablet Take 1 tablet by  mouth every 6 (six) hours as needed. 40 tablet 0   hydrOXYzine (VISTARIL) 25 MG capsule Take 1 capsule (25 mg total) by mouth 2 (two) times daily as needed. 30 capsule 3   latanoprost (XALATAN) 0.005 % ophthalmic solution Place 1 drop into both eyes nightly. 2.5 mL 3   latanoprost (XALATAN) 0.005 % ophthalmic solution Place 1 drop into both eyes nightly. 2.5 mL 3   methocarbamol (ROBAXIN) 750 MG tablet Take 1 tablet (750 mg total) by mouth 3 (three) times daily as needed (muscle spasm/pain). 15 tablet 0   Multiple  Vitamin (MULTIVITAMIN WITH MINERALS) TABS tablet Take 1 tablet by mouth daily.     pravastatin (PRAVACHOL) 10 MG tablet Take 1 tablet (10 mg total) by mouth daily. 90 tablet 1   sennosides-docusate sodium (SENOKOT-S) 8.6-50 MG tablet Take 1 tablet by mouth daily. 15 tablet 0   tamsulosin (FLOMAX) 0.4 MG CAPS capsule Take 1 capsule (0.4 mg total) by mouth daily after supper. 30 capsule 5   No current facility-administered medications on file prior to visit.    Allergies  Allergen Reactions   Lisinopril Swelling    Throat swelling   Tramadol Hcl     "Makes me crazy"    Social History   Socioeconomic History   Marital status: Married    Spouse name: Not on file   Number of children: 5   Years of education: Not on file   Highest education level: Not on file  Occupational History   Occupation: Caregiver    Comment: retired  Tobacco Use   Smoking status: Every Day    Packs/day: 0.25    Years: 10.00    Total pack years: 2.50    Types: Cigarettes   Smokeless tobacco: Never   Tobacco comments:    Quit 05/15/14  Vaping Use   Vaping Use: Never used  Substance and Sexual Activity   Alcohol use: Yes    Alcohol/week: 1.0 - 2.0 standard drink of alcohol    Types: 1 - 2 Standard drinks or equivalent per week    Comment: occasional wine   Drug use: No   Sexual activity: Not Currently  Other Topics Concern   Not on file  Social History Narrative   Lives with his wife   5 children, grown.   7 grandchildren.    Social Determinants of Health   Financial Resource Strain: Not on file  Food Insecurity: Not on file  Transportation Needs: Not on file  Physical Activity: Not on file  Stress: Not on file  Social Connections: Not on file  Intimate Partner Violence: Not on file    Family History  Problem Relation Age of Onset   Diabetes Mother    Breast cancer Mother    Diabetes Father    Breast cancer Sister    Prostate cancer Neg Hx    Colon cancer Neg Hx    Pancreatic  cancer Neg Hx     Past Surgical History:  Procedure Laterality Date   CATARACT EXTRACTION  06/2017   CYSTOSCOPY  05/2017   INTERCOSTAL NERVE BLOCK Left 05/13/2020   Procedure: INTERCOSTAL NERVE BLOCK;  Surgeon: Lajuana Matte, MD;  Location: [facility];  Service: Thoracic;  Laterality: Left;   LUMBAR LAMINECTOMY/DECOMPRESSION MICRODISCECTOMY Right 03/11/2021   Procedure: Microdiscectomy - right - Lumbar four-Lumbar five extraforaminal;  Surgeon: Earnie Larsson, MD;  Location: [facility];  Service: Neurosurgery;  Laterality: Right;   LUMBAR LAMINECTOMY/DECOMPRESSION MICRODISCECTOMY Left 12/29/2021   Procedure: Extraforaminal microdiscectomy -  left - L4-L5;  Surgeon: Earnie Larsson, MD;  Location: [facility];  Service: Neurosurgery;  Laterality: Left;  3C   PROSTATE BIOPSY     WEDGE RESECTION Left 05/13/2020   XI robotic assisted thorascopy    ROS: Review of Systems Negative except as stated above  PHYSICAL EXAM: BP 122/80   Pulse 89   Ht '6\' 1"'$  (1.854 m)   Wt 190 lb 3.2 oz (86.3 kg)   SpO2 97%   BMI 25.09 kg/m   Wt Readings from Last 3 Encounters:  03/31/22 190 lb 3.2 oz (86.3 kg)  12/29/21 190 lb (86.2 kg)  12/26/21 190 lb 12.8 oz (86.5 kg)    Physical Exam  General appearance - alert, well appearing, elderly African-American male and in no distress.  Patient ambulates with a cane. Mental status - normal mood, behavior, speech, dress, motor activity, and thought processes Neck - supple, no significant adenopathy Chest - clear to auscultation, no wheezes, rales or rhonchi, symmetric air entry Heart - normal rate, regular rhythm, normal S1, S2, no murmurs, rubs, clicks or gallops Extremities - peripheral pulses normal, no pedal edema, no clubbing or cyanosis      Latest Ref Rng & Units 12/29/2021   11:08 AM 11/17/2021    3:26 PM 03/12/2021   11:56 PM  CMP  Glucose 70 - 99 mg/dL 133  167  107   BUN 8 - 23 mg/dL '13  16  22   '$ Creatinine 0.61 - 1.24 mg/dL 1.12  1.36  0.96   Sodium 135 -  145 mmol/L 136  136  138   Potassium 3.5 - 5.1 mmol/L 3.8  4.2  3.8   Chloride 98 - 111 mmol/L 105  106  104   CO2 22 - 32 mmol/L '21  23  27   '$ Calcium 8.9 - 10.3 mg/dL 8.9  9.0  9.3   Total Protein 6.5 - 8.1 g/dL  7.0    Total Bilirubin 0.3 - 1.2 mg/dL  0.3    Alkaline Phos 38 - 126 U/L  77    AST 15 - 41 U/L  14    ALT 0 - 44 U/L  12     Lipid Panel     Component Value Date/Time   CHOL 189 01/29/2020 1022   TRIG 123 01/29/2020 1022   HDL 58 01/29/2020 1022   CHOLHDL 3.3 01/29/2020 1022   CHOLHDL 4.2 05/02/2014 0000   VLDL 72 (H) 05/02/2014 0000   LDLCALC 109 (H) 01/29/2020 1022    CBC    Component Value Date/Time   WBC 3.7 (L) 12/29/2021 1108   RBC 4.42 12/29/2021 1108   HGB 13.4 12/29/2021 1108   HGB 13.8 01/29/2020 1022   HCT 39.5 12/29/2021 1108   HCT 40.6 01/29/2020 1022   PLT 225 12/29/2021 1108   PLT 268 01/29/2020 1022   MCV 89.4 12/29/2021 1108   MCV 89 01/29/2020 1022   MCH 30.3 12/29/2021 1108   MCHC 33.9 12/29/2021 1108   RDW 15.0 12/29/2021 1108   RDW 13.8 01/29/2020 1022   LYMPHSABS 0.7 11/17/2021 1526   MONOABS 0.2 11/17/2021 1526   EOSABS 0.0 11/17/2021 1526   BASOSABS 0.0 11/17/2021 1526    ASSESSMENT AND PLAN:  1. Type 2 diabetes mellitus with other neurologic complication, without long-term current use of insulin (HCC) A1c close to goal. He is not having any hypoglycemic episodes.  We will keep him on current low-dose of Amaryl.  Encouraged him to continue healthy  eating habits and trying to move as much as he can. - Lipid panel - Comprehensive metabolic panel - Microalbumin / creatinine urine ratio - POCT glycosylated hemoglobin (Hb A1C)  2. Hypertension associated with type 2 diabetes mellitus (HCC) At goal.  Continue Norvasc 10 mg daily.  3. Paroxysmal atrial fibrillation (HCC) Stable.  Continue Eliquis.  Last CBC in July of this year revealed normal H&H and platelet count.  4. Need for shingles vaccine Scription given for Shingrix  for him to take to his pharmacy to get the first shot.  Advised that he will be due for the second shot 2 to 6 months after the first.  5. Need for immunization against influenza - Flu Vaccine QUAD 54moIM (Fluarix, Fluzone & Alfiuria Quad PF)   Patient was given the opportunity to ask questions.  Patient verbalized understanding of the plan and was able to repeat key elements of the plan.   This documentation was completed using DRadio producer  Any transcriptional errors are unintentional.  Orders Placed This Encounter  Procedures   Flu Vaccine QUAD 613moM (Fluarix, Fluzone & Alfiuria Quad PF)   Lipid panel   Comprehensive metabolic panel   Microalbumin / creatinine urine ratio   POCT glycosylated hemoglobin (Hb A1C)     Requested Prescriptions   Signed Prescriptions Disp Refills   Zoster Vaccine Adjuvanted (SHINGRIX) injection 0.5 mL 0    Sig: Inject 0.5 mLs into the muscle once for 1 dose.    Return in about 7 weeks (around 05/19/2022) for Medicare Wellness VIsit.  DeKarle PlumberMD, FACP

## 2022-04-01 ENCOUNTER — Other Ambulatory Visit: Payer: Self-pay

## 2022-04-01 ENCOUNTER — Other Ambulatory Visit: Payer: Self-pay | Admitting: Internal Medicine

## 2022-04-01 LAB — COMPREHENSIVE METABOLIC PANEL
ALT: 14 IU/L (ref 0–44)
AST: 14 IU/L (ref 0–40)
Albumin/Globulin Ratio: 1.6 (ref 1.2–2.2)
Albumin: 4.6 g/dL (ref 3.9–4.9)
Alkaline Phosphatase: 88 IU/L (ref 44–121)
BUN/Creatinine Ratio: 11 (ref 10–24)
BUN: 16 mg/dL (ref 8–27)
Bilirubin Total: 0.3 mg/dL (ref 0.0–1.2)
CO2: 21 mmol/L (ref 20–29)
Calcium: 9.6 mg/dL (ref 8.6–10.2)
Chloride: 102 mmol/L (ref 96–106)
Creatinine, Ser: 1.46 mg/dL — ABNORMAL HIGH (ref 0.76–1.27)
Globulin, Total: 2.8 g/dL (ref 1.5–4.5)
Glucose: 178 mg/dL — ABNORMAL HIGH (ref 70–99)
Potassium: 4.9 mmol/L (ref 3.5–5.2)
Sodium: 138 mmol/L (ref 134–144)
Total Protein: 7.4 g/dL (ref 6.0–8.5)
eGFR: 51 mL/min/{1.73_m2} — ABNORMAL LOW (ref >59)

## 2022-04-01 LAB — LIPID PANEL
Chol/HDL Ratio: 3.7 ratio (ref 0.0–5.0)
Cholesterol, Total: 223 mg/dL — ABNORMAL HIGH (ref 100–199)
HDL: 60 mg/dL (ref >39)
LDL Chol Calc (NIH): 138 mg/dL — ABNORMAL HIGH (ref 0–99)
Triglycerides: 142 mg/dL (ref 0–149)
VLDL Cholesterol Cal: 25 mg/dL (ref 5–40)

## 2022-04-01 MED ORDER — ATORVASTATIN CALCIUM 20 MG PO TABS
20.0000 mg | ORAL_TABLET | Freq: Every day | ORAL | 3 refills | Status: DC
  Filled 2022-04-01: qty 90, 90d supply, fill #0

## 2022-04-02 ENCOUNTER — Telehealth: Payer: Self-pay | Admitting: Internal Medicine

## 2022-04-02 NOTE — Telephone Encounter (Signed)
Pt stated he does not want to take  atorvastatin (LIPITOR) 20 MG tablet after doing research on the side effects / please advise   Pt also wanted to know how long until he can pick up the form for his handicap sticker

## 2022-04-02 NOTE — Telephone Encounter (Signed)
Routing information to Dr. [name]. I have seen this patient before for a Medicare AWV and for medication reconciliation. He has voiced similar concerns with statin use in the past when I have recommended them.

## 2022-04-03 ENCOUNTER — Other Ambulatory Visit: Payer: Self-pay

## 2022-04-14 NOTE — Telephone Encounter (Signed)
Pt was called and a VM was left informing patient that handicap form is ready for pick up.

## 2022-04-17 ENCOUNTER — Ambulatory Visit: Payer: Medicare HMO | Attending: Internal Medicine

## 2022-04-17 VITALS — Ht 73.0 in | Wt 190.0 lb

## 2022-04-17 DIAGNOSIS — E1149 Type 2 diabetes mellitus with other diabetic neurological complication: Secondary | ICD-10-CM | POA: Diagnosis not present

## 2022-04-17 DIAGNOSIS — Z0000 Encounter for general adult medical examination without abnormal findings: Secondary | ICD-10-CM | POA: Diagnosis not present

## 2022-04-17 NOTE — Progress Notes (Signed)
Subjective:   [name] is a 71 y.o. male who presents for Medicare Annual/Subsequent preventive examination.  Review of Systems    Virtual Visit via Telephone Note  I connected with  [name] on 04/17/22 at 11:00 AM EDT by telephone and verified that I am speaking with the correct person using two identifiers.  Location: Patient: Home Provider: Office Persons participating in the virtual visit: patient/Nurse Health Advisor   I discussed the limitations, risks, security and privacy concerns of performing an evaluation and management service by telephone and the availability of in person appointments. The patient expressed understanding and agreed to proceed.  Interactive audio and video telecommunications were attempted between this nurse and patient, however failed, due to patient having technical difficulties OR patient did not have access to video capability.  We continued and completed visit with audio only.  Some vital signs may be absent or patient reported.   Scott Peaches, LPN  Cardiac Risk Factors include: advanced age (>15mn, >>82women);diabetes mellitus;hypertension;male gender     Objective:    Today's Vitals   04/17/22 1110  Weight: 190 lb (86.2 kg)  Height: '6\' 1"'$  (1.854 m)   Body mass index is 25.07 kg/m.     04/17/2022   11:22 AM 12/29/2021   10:35 AM 11/17/2021    3:16 PM 07/15/2021    2:59 PM 03/12/2021   11:10 PM 03/07/2021    8:32 AM 01/14/2021    3:38 AM  Advanced Directives  Does Patient Have a Medical Advance Directive? Yes No No No No No No  Type of AParamedicof AGrawnLiving will        Copy of [place]? No - copy requested        Would patient like information on creating a medical advance directive?   No - Patient declined  No - Patient declined Yes (MAU/Ambulatory/Procedural Areas - Information given) No - Patient declined    Current Medications  (verified) Outpatient Encounter Medications as of 04/17/2022  Medication Sig   amLODipine (NORVASC) 10 MG tablet Take 1 tablet (10 mg total) by mouth daily.   apixaban (ELIQUIS) 5 MG TABS tablet TAKE 1 TABLET BY MOUTH TWICE DAILY.   atorvastatin (LIPITOR) 20 MG tablet Take 1 tablet (20 mg total) by mouth daily.   B Complex Vitamins (VITAMIN B COMPLEX) TABS Take 1 tablet by mouth daily.   diclofenac Sodium (VOLTAREN) 1 % GEL Apply 2 g topically 3 (three) times daily as needed (pain).   fluticasone (FLONASE) 50 MCG/ACT nasal spray Place 1 spray into both nostrils daily as needed for allergies or rhinitis.   gabapentin (NEURONTIN) 100 MG capsule TAKE 1 CAPSULE (100 MG TOTAL) BY MOUTH 3 (THREE) TIMES DAILY. NEEDED FOR NUMBNESS   glimepiride (AMARYL) 1 MG tablet Take 1 tablet (1 mg total) by mouth daily with breakfast.   HYDROcodone-acetaminophen (NORCO) 10-325 MG tablet Take 1 tablet by mouth every 6 (six) hours as needed.   hydrOXYzine (VISTARIL) 25 MG capsule Take 1 capsule (25 mg total) by mouth 2 (two) times daily as needed.   latanoprost (XALATAN) 0.005 % ophthalmic solution Place 1 drop into both eyes nightly.   latanoprost (XALATAN) 0.005 % ophthalmic solution Place 1 drop into both eyes nightly.   methocarbamol (ROBAXIN) 750 MG tablet Take 1 tablet (750 mg total) by mouth 3 (three) times daily as needed (muscle spasm/pain).   Multiple Vitamin (MULTIVITAMIN WITH MINERALS) TABS tablet Take 1  tablet by mouth daily.   sennosides-docusate sodium (SENOKOT-S) 8.6-50 MG tablet Take 1 tablet by mouth daily.   tamsulosin (FLOMAX) 0.4 MG CAPS capsule Take 1 capsule (0.4 mg total) by mouth daily after supper.   No facility-administered encounter medications on file as of 04/17/2022.    Allergies (verified) Lisinopril and Tramadol hcl   History: Past Medical History:  Diagnosis Date   Abscess of anal and rectal regions 07/15/2007   Qualifier: Diagnosis of  By: Amil Amen MD, Elizabeth     Atrial  fibrillation [facility])    Diabetes mellitus without complication ([place]) 9326   type 2   FISTULA, ANAL 01/27/2007   Qualifier: Diagnosis of  By: Amil Amen MD, Elizabeth     GERD (gastroesophageal reflux disease)    Hypertension Dx 2008   Prostate cancer ([place])    Spontaneous pneumothorax    Stroke [facility])    Past Surgical History:  Procedure Laterality Date   CATARACT EXTRACTION  06/2017   CYSTOSCOPY  05/2017   INTERCOSTAL NERVE BLOCK Left 05/13/2020   Procedure: INTERCOSTAL NERVE BLOCK;  Surgeon: Lajuana Matte, MD;  Location: [place];  Service: Thoracic;  Laterality: Left;   LUMBAR LAMINECTOMY/DECOMPRESSION MICRODISCECTOMY Right 03/11/2021   Procedure: Microdiscectomy - right - Lumbar four-Lumbar five extraforaminal;  Surgeon: Earnie Larsson, MD;  Location: [place];  Service: Neurosurgery;  Laterality: Right;   LUMBAR LAMINECTOMY/DECOMPRESSION MICRODISCECTOMY Left 12/29/2021   Procedure: Extraforaminal microdiscectomy - left - L4-L5;  Surgeon: Earnie Larsson, MD;  Location: [place];  Service: Neurosurgery;  Laterality: Left;  3C   PROSTATE BIOPSY     WEDGE RESECTION Left 05/13/2020   XI robotic assisted thorascopy   Family History  Problem Relation Age of Onset   Diabetes Mother    Breast cancer Mother    Diabetes Father    Breast cancer Sister    Prostate cancer Neg Hx    Colon cancer Neg Hx    Pancreatic cancer Neg Hx    Social History   Socioeconomic History   Marital status: Married    Spouse name: Not on file   Number of children: 5   Years of education: Not on file   Highest education level: Not on file  Occupational History   Occupation: Caregiver    Comment: retired  Tobacco Use   Smoking status: Former    Packs/day: 0.25    Years: 10.00    Total pack years: 2.50    Types: Cigarettes   Smokeless tobacco: Never   Tobacco comments:    Quit 05/15/14  Vaping Use   Vaping Use: Never used  Substance and Sexual Activity   Alcohol use: Yes    Alcohol/week: 1.0 - 2.0  standard drink of alcohol    Types: 1 - 2 Standard drinks or equivalent per week    Comment: occasional wine   Drug use: No   Sexual activity: Not Currently  Other Topics Concern   Not on file  Social History Narrative   Lives with his wife   5 children, grown.   7 grandchildren.    Social Determinants of Health   Financial Resource Strain: Low Risk  (04/17/2022)   Overall Financial Resource Strain (CARDIA)    Difficulty of Paying Living Expenses: Not hard at all  Food Insecurity: No Food Insecurity (04/17/2022)   Hunger Vital Sign    Worried About Running Out of Food in the Last Year: Never true    Ran Out of Food in the Last Year: Never true  Transportation Needs: No Transportation Needs (04/17/2022)   PRAPARE - Hydrologist (Medical): No    Lack of Transportation (Non-Medical): No  Physical Activity: Insufficiently Active (04/17/2022)   Exercise Vital Sign    Days of Exercise per Week: 3 days    Minutes of Exercise per Session: 30 min  Stress: No Stress Concern Present (04/17/2022)   [place]    Feeling of Stress : Not at all  Social Connections: Moderately Integrated (04/17/2022)   Social Connection and Isolation Panel [NHANES]    Frequency of Communication with Friends and Family: More than three times a week    Frequency of Social Gatherings with Friends and Family: More than three times a week    Attends Religious Services: More than 4 times per year    Active Member of [employer] or Organizations: Yes    Attends Music therapist: More than 4 times per year    Marital Status: Separated    Tobacco Counseling Counseling given: Yes Tobacco comments: Quit 05/15/14   Clinical Intake:  Pre-visit preparation completed: No  Nutrition Risk Assessment:  Has the patient had any N/V/D within the last 2 months?  No  Does the patient have any non-healing wounds?  No   Has the patient had any unintentional weight loss or weight gain?  No   Diabetes:  Is the patient diabetic?  Yes  If diabetic, was a CBG obtained today?  No  Did the patient bring in their glucometer from home?  No  How often do you monitor your CBG's? 3 x weekly.   Financial Strains and Diabetes Management:  Are you having any financial strains with the device, your supplies or your medication? No .  Does the patient want to be seen by Chronic Care Management for management of their diabetes?  No  Would the patient like to be referred to a Nutritionist or for Diabetic Management?  No   Diabetic Exams:  Diabetic Eye Exam: Completed No. Overdue for diabetic eye exam. Pt has been advised about the importance in completing this exam. A referral has been placed today. Message sent to referral coordinator for scheduling purposes. Advised pt to expect a call from office referred to regarding appt.  Diabetic Foot Exam: Completed No. Pt has been advised about the importance in completing this exam. Pt is scheduled for diabetic foot exam on Followed by PCP.       BMI - recorded: 25.07 Nutritional Status: BMI 25 -29 Overweight Nutritional Risks: None Diabetes: Yes CBG done?: No Did pt. bring in CBG monitor from home?: No  How often do you need to have someone help you when you read instructions, pamphlets, or other written materials from your doctor or pharmacy?: 1 - Never  Diabetic?  Yes  Interpreter Needed?: No  Information entered by :: Rolene Arbour LPN   Activities of Daily Living    04/17/2022   11:20 AM 12/29/2021   10:39 AM  In your present state of health, do you have any difficulty performing the following activities:  Hearing? 0   Vision? 0   Difficulty concentrating or making decisions? 0   Walking or climbing stairs? 0   Dressing or bathing? 0   Doing errands, shopping? 0 0  Preparing Food and eating ? N   Using the Toilet? N   In the past six months, have  you accidently leaked urine? N   Do you  have problems with loss of bowel control? N   Managing your Medications? N   Managing your Finances? N   Housekeeping or managing your Housekeeping? N     Patient Care Team: [name], MD as PCP - General (Internal Medicine) Cira Rue, RN Nurse Navigator as Registered Nurse (Medical Oncology)  Indicate any recent Medical Services you may have received from other than [facility] providers in the past year (date may be approximate).     Assessment:   This is a routine wellness examination for [place].  Hearing/Vision screen Hearing Screening - Comments:: Denies hearing difficulties   Vision Screening - Comments:: Wears rx glasses - up to date with routine eye exams with  Dr Gershon Crane  Dietary issues and exercise activities discussed: Current Exercise Habits: Home exercise routine, Type of exercise: walking, Time (Minutes): 30, Frequency (Times/Week): 3, Weekly Exercise (Minutes/Week): 90, Intensity: Moderate, Exercise limited by: None identified   Goals Addressed               This Visit's Progress     Patient stated (pt-stated)        I want to get a steady gait when walking.       Depression Screen    04/17/2022   11:18 AM 12/26/2021    2:46 PM 11/20/2021    3:45 PM 11/29/2020    3:52 PM 10/28/2020    3:43 PM 05/27/2020   12:02 PM 04/08/2020   10:40 AM  PHQ 2/9 Scores  PHQ - 2 Score 0 0 0   0 0  PHQ- 9 Score  0       Exception Documentation    Patient refusal Patient refusal      Fall Risk    04/17/2022   11:20 AM 03/31/2022    3:26 PM 12/26/2021    2:46 PM 11/20/2021    3:45 PM 11/29/2020    3:51 PM  [place] in the past year? 1 1 0 0 1  Number falls in past yr: 0 1 0 0 1  Injury with Fall? 0 0 0 0 0  Risk for fall due to : No Fall Risks Impaired balance/gait;History of fall(s)  No Fall Risks History of fall(s)  Follow up Falls prevention discussed Falls evaluation completed   Falls evaluation  completed;Education provided;Falls prevention discussed    FALL RISK PREVENTION PERTAINING TO THE HOME:  Any stairs in or around the home? Yes  If so, are there any without handrails? No  Home free of loose throw rugs in walkways, pet beds, electrical cords, etc? Yes  Adequate lighting in your home to reduce risk of falls? Yes   ASSISTIVE DEVICES UTILIZED TO PREVENT FALLS:  Life alert? No  Use of a cane, walker or w/c? Yes  Grab bars in the bathroom? Yes  Shower chair or bench in shower? Yes  Elevated toilet seat or a handicapped toilet? Yes   TIMED UP AND GO:  Was the test performed? No . Audio Visit   Cognitive Function:    11/29/2020    3:52 PM 11/13/2019    3:40 PM  MMSE - Mini Mental State Exam  Orientation to time 5 5  Orientation to Place 5 5  Registration 3 3  Attention/ Calculation 5 5  Recall 3 3  Language- name 2 objects 2 2  Language- repeat 1 1  Language- follow 3 step command 3 3  Language- read & follow direction 1 1  Write a sentence  1 1  Copy design 0 1  Total score 29 30        04/17/2022   11:22 AM  6CIT Screen  What Year? 0 points  What month? 0 points  What time? 0 points  Count back from 20 0 points  Months in reverse 0 points  Repeat phrase 0 points  Total Score 0 points    Immunizations Immunization History  Administered Date(s) Administered   Influenza Whole 03/27/2009, 07/17/2010   Influenza,inj,Quad PF,6+ Mos 06/09/2018, 03/01/2019, 04/25/2020, 04/22/2021, 03/31/2022   PFIZER(Purple Top)SARS-COV-2 Vaccination 07/29/2019, 08/21/2019, 04/08/2020   Pneumococcal Conjugate-13 03/23/2017   Pneumococcal Polysaccharide-23 07/17/2010, 06/09/2018   Td 06/22/2005   Tdap 01/29/2020    TDAP status: Up to date  Flu Vaccine status: Up to date  Pneumococcal vaccine status: Up to date  Covid-19 vaccine status: Completed vaccines  Qualifies for Shingles Vaccine? Yes   Zostavax completed No   Shingrix Completed?: No.    Education has  been provided regarding the importance of this vaccine. Patient has been advised to call insurance company to determine out of pocket expense if they have not yet received this vaccine. Advised may also receive vaccine at local pharmacy or Health Dept. Verbalized acceptance and understanding.  Screening Tests Health Maintenance  Topic Date Due   Diabetic kidney evaluation - Urine ACR  10/29/2021   COVID-19 Vaccine (4 - Pfizer risk series) 05/03/2022 (Originally 06/03/2020)   Zoster Vaccines- Shingrix (1 of 2) 07/18/2022 (Originally 01/23/1970)   OPHTHALMOLOGY EXAM  08/07/2022   HEMOGLOBIN A1C  09/30/2022   FOOT EXAM  12/03/2022   Fecal DNA (Cologuard)  03/22/2023   Diabetic kidney evaluation - GFR measurement  04/01/2023   Medicare Annual Wellness (AWV)  04/18/2023   TETANUS/TDAP  01/28/2030   Pneumonia Vaccine 74+ Years old  Completed   INFLUENZA VACCINE  Completed   Hepatitis C Screening  Completed   HPV VACCINES  Aged Out    Health Maintenance  Health Maintenance Due  Topic Date Due   Diabetic kidney evaluation - Urine ACR  10/29/2021    Colorectal cancer screening: Type of screening: Cologuard. Completed 03/21/20. Repeat every 3 years  Lung Cancer Screening: (Low Dose CT Chest recommended if Age 19- 53 years, 30 pack-year currently smoking OR have quit w/in 15years.) does qualify.   Lung Cancer Screening Referral: Patient deferred  Additional Screening:  Hepatitis C Screening: does qualify; Completed 01/29/20  Vision Screening: Recommended annual ophthalmology exams for early detection of glaucoma and other disorders of the eye. Is the patient up to date with their annual eye exam?  Yes  Who is the provider or what is the name of the office in which the patient attends annual eye exams? Dr Dietrich Pates If pt is not established with a provider, would they like to be referred to a provider to establish care? No .   Dental Screening: Recommended annual dental exams for proper oral  hygiene  Community Resource Referral / Chronic Care Management:  CRR required this visit?  No   CCM required this visit?  No      Plan:     I have personally reviewed and noted the following in the patient's chart:   Medical and social history Use of alcohol, tobacco or illicit drugs  Current medications and supplements including opioid prescriptions. Patient is currently taking opioid prescriptions. Information provided to patient regarding non-opioid alternatives. Patient advised to discuss non-opioid treatment plan with their provider. Functional ability and status Nutritional status Physical  activity Advanced directives List of other physicians Hospitalizations, surgeries, and ER visits in previous 12 months Vitals Screenings to include cognitive, depression, and falls Referrals and appointments  In addition, I have reviewed and discussed with patient certain preventive protocols, quality metrics, and best practice recommendations. A written personalized care plan for preventive services as well as general preventive health recommendations were provided to patient.     Scott Peaches, LPN   [id number]   Nurse Notes: None

## 2022-04-17 NOTE — Patient Instructions (Addendum)
[name] , Thank you for taking time to come for your Medicare Wellness Visit. I appreciate your ongoing commitment to your health goals. Please review the following plan we discussed and let me know if I can assist you in the future.   These are the goals we discussed:  Goals       Blood Pressure < 140/90      HEMOGLOBIN A1C < 7.0      Patient stated (pt-stated)      I want to get a steady gait when walking.        This is a list of the screening recommended for you and due dates:  Health Maintenance  Topic Date Due   Yearly kidney health urinalysis for diabetes  10/29/2021   COVID-19 Vaccine (4 - Pfizer risk series) 05/03/2022*   Zoster (Shingles) Vaccine (1 of 2) 07/18/2022*   Eye exam for diabetics  08/07/2022   Hemoglobin A1C  09/30/2022   Complete foot exam   12/03/2022   Cologuard (Stool DNA test)  03/22/2023   Yearly kidney function blood test for diabetes  04/01/2023   Medicare Annual Wellness Visit  04/18/2023   Tetanus Vaccine  01/28/2030   Pneumonia Vaccine  Completed   Flu Shot  Completed   Hepatitis C Screening: USPSTF Recommendation to screen - Ages 18-79 yo.  Completed   HPV Vaccine  Aged Out  *Topic was postponed. The date shown is not the original due date.   Opioid Pain Medicine Management Opioids are powerful medicines that are used to treat moderate to severe pain. When used for short periods of time, they can help you to: Sleep better. Do better in physical or occupational therapy. Feel better in the first few days after an injury. Recover from surgery. Opioids should be taken with the supervision of a trained health care provider. They should be taken for the shortest period of time possible. This is because opioids can be addictive, and the longer you take opioids, the greater your risk of addiction. This addiction can also be called opioid use disorder. What are the risks? Using opioid pain medicines for longer than 3 days increases your risk of  side effects. Side effects include: Constipation. Nausea and vomiting. Breathing difficulties (respiratory depression). Drowsiness. Confusion. Opioid use disorder. Itching. Taking opioid pain medicine for a long period of time can affect your ability to do daily tasks. It also puts you at risk for: Motor vehicle crashes. Depression. Suicide. Heart attack. Overdose, which can be life-threatening. What is a pain treatment plan? A pain treatment plan is an agreement between you and your health care provider. Pain is unique to each person, and treatments vary depending on your condition. To manage your pain, you and your health care provider need to work together. To help you do this: Discuss the goals of your treatment, including how much pain you might expect to have and how you will manage the pain. Review the risks and benefits of taking opioid medicines. Remember that a good treatment plan uses more than one approach and minimizes the chance of side effects. Be honest about the amount of medicines you take and about any drug or alcohol use. Get pain medicine prescriptions from only one health care provider. Pain can be managed with many types of alternative treatments. Ask your health care provider to refer you to one or more specialists who can help you manage pain through: Physical or occupational therapy. Counseling (cognitive behavioral therapy). Good nutrition. Biofeedback. Massage.  Meditation. Non-opioid medicine. Following a gentle exercise program. How to use opioid pain medicine Taking medicine Take your pain medicine exactly as told by your health care provider. Take it only when you need it. If your pain gets less severe, you may take less than your prescribed dose if your health care provider approves. If you are not having pain, do nottake pain medicine unless your health care provider tells you to take it. If your pain is severe, do nottry to treat it yourself by  taking more pills than instructed on your prescription. Contact your health care provider for help. Write down the times when you take your pain medicine. It is easy to become confused while on pain medicine. Writing the time can help you avoid overdose. Take other over-the-counter or prescription medicines only as told by your health care provider. Keeping yourself and others safe  While you are taking opioid pain medicine: Do not drive, use machinery, or power tools. Do not sign legal documents. Do not drink alcohol. Do not take sleeping pills. Do not supervise children by yourself. Do not do activities that require climbing or being in high places. Do not go to a lake, river, ocean, spa, or swimming pool. Do not share your pain medicine with anyone. Keep pain medicine in a locked cabinet or in a secure area where pets and children cannot reach it. Stopping your use of opioids If you have been taking opioid medicine for more than a few weeks, you may need to slowly decrease (taper) how much you take until you stop completely. Tapering your use of opioids can decrease your risk of symptoms of withdrawal, such as: Pain and cramping in the abdomen. Nausea. Sweating. Sleepiness. Restlessness. Uncontrollable shaking (tremors). Cravings for the medicine. Do not attempt to taper your use of opioids on your own. Talk with your health care provider about how to do this. Your health care provider may prescribe a step-down schedule based on how much medicine you are taking and how long you have been taking it. Getting rid of leftover pills Do not save any leftover pills. Get rid of leftover pills safely by: Taking the medicine to a prescription take-back program. This is usually offered by the county or law enforcement. Bringing them to a pharmacy that has a drug disposal container. Flushing them down the toilet. Check the label or package insert of your medicine to see whether this is safe to  do. Throwing them out in the trash. Check the label or package insert of your medicine to see whether this is safe to do. If it is safe to throw it out, remove the medicine from the original container, put it into a sealable bag or container, and mix it with used coffee grounds, food scraps, dirt, or cat litter before putting it in the trash. Follow these instructions at home: Activity Do exercises as told by your health care provider. Avoid activities that make your pain worse. Return to your normal activities as told by your health care provider. Ask your health care provider what activities are safe for you. General instructions You may need to take these actions to prevent or treat constipation: Drink enough fluid to keep your urine pale yellow. Take over-the-counter or prescription medicines. Eat foods that are high in fiber, such as beans, whole grains, and fresh fruits and vegetables. Limit foods that are high in fat and processed sugars, such as fried or sweet foods. Keep all follow-up visits. This is important. Where to  find support If you have been taking opioids for a long time, you may benefit from receiving support for quitting from a local support group or counselor. Ask your health care provider for a referral to these resources in your area. Where to find more information Centers for Disease Control and Prevention (CDC): http://www.wolf.info/ [place] Food and Drug Administration (FDA): [place] Get help right away if: You may have taken too much of an opioid (overdosed). Common symptoms of an overdose: Your breathing is slower or more shallow than normal. You have a very slow heartbeat (pulse). You have slurred speech. You have nausea and vomiting. Your pupils become very small. You have other potential symptoms: You are very confused. You faint or feel like you will faint. You have cold, clammy skin. You have blue lips or fingernails. You have thoughts of harming yourself or  harming others. These symptoms may represent a serious problem that is an emergency. Do not wait to see if the symptoms will go away. Get medical help right away. Call your local emergency services (911 in the [place] Do not drive yourself to the hospital.  If you ever feel like you may hurt yourself or others, or have thoughts about taking your own life, get help right away. Go to your nearest emergency department or: Call your local emergency services (911 in the [place] Call the [facility] 920-138-8661 in the [place] Call a suicide crisis helpline, such as the [facility] at (828) 159-4977 or 988 in the [place]. This is open 24 hours a day in the [place] Text the Crisis Text Line at 719-225-4787 (in the [place].). Summary Opioid medicines can help you manage moderate to severe pain for a short period of time. A pain treatment plan is an agreement between you and your health care provider. Discuss the goals of your treatment, including how much pain you might expect to have and how you will manage the pain. If you think that you or someone else may have taken too much of an opioid, get medical help right away. This information is not intended to replace advice given to you by your health care provider. Make sure you discuss any questions you have with your health care provider. Document Revised: 01/01/2021 Document Reviewed: 09/18/2020 Elsevier Patient Education  [place] directives: Please bring a copy of your health care power of attorney and living will to the office to be added to your chart at your convenience.   Conditions/risks identified: None  Next appointment: Follow up in one year for your annual wellness visit.    Preventive Care 59 Years and Older, Male  Preventive care refers to lifestyle choices and visits with your health care provider that can promote health and wellness. What does preventive care include? A yearly physical  exam. This is also called an annual well check. Dental exams once or twice a year. Routine eye exams. Ask your health care provider how often you should have your eyes checked. Personal lifestyle choices, including: Daily care of your teeth and gums. Regular physical activity. Eating a healthy diet. Avoiding tobacco and drug use. Limiting alcohol use. Practicing safe sex. Taking low doses of aspirin every day. Taking vitamin and mineral supplements as recommended by your health care provider. What happens during an annual well check? The services and screenings done by your health care provider during your annual well check will depend on your age, overall health, lifestyle risk factors, and family history of disease.  Counseling  Your health care provider may ask you questions about your: Alcohol use. Tobacco use. Drug use. Emotional well-being. Home and relationship well-being. Sexual activity. Eating habits. History of falls. Memory and ability to understand (cognition). Work and work Statistician. Screening  You may have the following tests or measurements: Height, weight, and BMI. Blood pressure. Lipid and cholesterol levels. These may be checked every 5 years, or more frequently if you are over 15 years old. Skin check. Lung cancer screening. You may have this screening every year starting at age 72 if you have a 30-pack-year history of smoking and currently smoke or have quit within the past 15 years. Fecal occult blood test (FOBT) of the stool. You may have this test every year starting at age 55. Flexible sigmoidoscopy or colonoscopy. You may have a sigmoidoscopy every 5 years or a colonoscopy every 10 years starting at age 47. Prostate cancer screening. Recommendations will vary depending on your family history and other risks. Hepatitis C blood test. Hepatitis B blood test. Sexually transmitted disease (STD) testing. Diabetes screening. This is done by checking your  blood sugar (glucose) after you have not eaten for a while (fasting). You may have this done every 1-3 years. Abdominal aortic aneurysm (AAA) screening. You may need this if you are a current or former smoker. Osteoporosis. You may be screened starting at age 27 if you are at high risk. Talk with your health care provider about your test results, treatment options, and if necessary, the need for more tests. Vaccines  Your health care provider may recommend certain vaccines, such as: Influenza vaccine. This is recommended every year. Tetanus, diphtheria, and acellular pertussis (Tdap, Td) vaccine. You may need a Td booster every 10 years. Zoster vaccine. You may need this after age 22. Pneumococcal 13-valent conjugate (PCV13) vaccine. One dose is recommended after age 63. Pneumococcal polysaccharide (PPSV23) vaccine. One dose is recommended after age 39. Talk to your health care provider about which screenings and vaccines you need and how often you need them. This information is not intended to replace advice given to you by your health care provider. Make sure you discuss any questions you have with your health care provider. Document Released: 07/05/2015 Document Revised: 02/26/2016 Document Reviewed: 04/09/2015 Elsevier Interactive Patient Education  2017 [place] Prevention in the Home Falls can cause injuries. They can happen to people of all ages. There are many things you can do to make your home safe and to help prevent falls. What can I do on the outside of my home? Regularly fix the edges of walkways and driveways and fix any cracks. Remove anything that might make you trip as you walk through a door, such as a raised step or threshold. Trim any bushes or trees on the path to your home. Use bright outdoor lighting. Clear any walking paths of anything that might make someone trip, such as rocks or tools. Regularly check to see if handrails are loose or broken. Make sure  that both sides of any steps have handrails. Any raised decks and porches should have guardrails on the edges. Have any leaves, snow, or ice cleared regularly. Use sand or salt on walking paths during winter. Clean up any spills in your garage right away. This includes oil or grease spills. What can I do in the bathroom? Use night lights. Install grab bars by the toilet and in the tub and shower. Do not use towel bars as grab bars. Use non-skid mats  or decals in the tub or shower. If you need to sit down in the shower, use a plastic, non-slip stool. Keep the floor dry. Clean up any water that spills on the floor as soon as it happens. Remove soap buildup in the tub or shower regularly. Attach bath mats securely with double-sided non-slip rug tape. Do not have throw rugs and other things on the floor that can make you trip. What can I do in the bedroom? Use night lights. Make sure that you have a light by your bed that is easy to reach. Do not use any sheets or blankets that are too big for your bed. They should not hang down onto the floor. Have a firm chair that has side arms. You can use this for support while you get dressed. Do not have throw rugs and other things on the floor that can make you trip. What can I do in the kitchen? Clean up any spills right away. Avoid walking on wet floors. Keep items that you use a lot in easy-to-reach places. If you need to reach something above you, use a strong step stool that has a grab bar. Keep electrical cords out of the way. Do not use floor polish or wax that makes floors slippery. If you must use wax, use non-skid floor wax. Do not have throw rugs and other things on the floor that can make you trip. What can I do with my stairs? Do not leave any items on the stairs. Make sure that there are handrails on both sides of the stairs and use them. Fix handrails that are broken or loose. Make sure that handrails are as long as the  stairways. Check any carpeting to make sure that it is firmly attached to the stairs. Fix any carpet that is loose or worn. Avoid having throw rugs at the top or bottom of the stairs. If you do have throw rugs, attach them to the floor with carpet tape. Make sure that you have a light switch at the top of the stairs and the bottom of the stairs. If you do not have them, ask someone to add them for you. What else can I do to help prevent falls? Wear shoes that: Do not have high heels. Have rubber bottoms. Are comfortable and fit you well. Are closed at the toe. Do not wear sandals. If you use a stepladder: Make sure that it is fully opened. Do not climb a closed stepladder. Make sure that both sides of the stepladder are locked into place. Ask someone to hold it for you, if possible. Clearly mark and make sure that you can see: Any grab bars or handrails. First and last steps. Where the edge of each step is. Use tools that help you move around (mobility aids) if they are needed. These include: Canes. Walkers. Scooters. Crutches. Turn on the lights when you go into a dark area. Replace any light bulbs as soon as they burn out. Set up your furniture so you have a clear path. Avoid moving your furniture around. If any of your floors are uneven, fix them. If there are any pets around you, be aware of where they are. Review your medicines with your doctor. Some medicines can make you feel dizzy. This can increase your chance of falling. Ask your doctor what other things that you can do to help prevent falls. This information is not intended to replace advice given to you by your health care provider. Make sure  you discuss any questions you have with your health care provider. Document Released: 04/04/2009 Document Revised: 11/14/2015 Document Reviewed: 07/13/2014 Elsevier Interactive Patient Education  2017 [employer].

## 2022-04-19 LAB — MICROALBUMIN / CREATININE URINE RATIO
Creatinine, Urine: 144 mg/dL
Microalb/Creat Ratio: 4 mg/g creat (ref 0–29)
Microalbumin, Urine: 5.7 ug/mL

## 2022-04-27 ENCOUNTER — Other Ambulatory Visit: Payer: Self-pay

## 2022-04-27 ENCOUNTER — Telehealth: Payer: Self-pay | Admitting: *Deleted

## 2022-04-27 MED ORDER — PRAVASTATIN SODIUM 20 MG PO TABS
20.0000 mg | ORAL_TABLET | Freq: Every day | ORAL | 1 refills | Status: DC
  Filled 2022-04-27: qty 90, 90d supply, fill #0

## 2022-04-27 NOTE — Telephone Encounter (Signed)
Patient aware of results.  Informed the appointment in  November is not necessary since he had a virtual AWV.   Please advise on Pravastatin. Did not see order in the chart.  Per patient, he is not able to tolerate atorvastatin.  States he was told at his OV to restart pravastatin and is taking an extra dose of it as directed by PCP.

## 2022-04-27 NOTE — Addendum Note (Signed)
Addended by: Karle Plumber B on: 04/27/2022 01:31 PM   Modules accepted: Orders

## 2022-04-27 NOTE — Telephone Encounter (Signed)
Patient called for remaining lab results. All results reviewed. Patient was going to set up 6 month follow-up to for next visit- but ha has a wellness visit with provider. Patient asked if he needs that appointment- please review and reschedule for patient if not needed.

## 2022-04-29 ENCOUNTER — Ambulatory Visit: Payer: Self-pay | Admitting: *Deleted

## 2022-04-29 NOTE — Telephone Encounter (Signed)
Noted  

## 2022-04-29 NOTE — Telephone Encounter (Signed)
Reason for Disposition  [1] SEVERE diarrhea (e.g., 7 or more times / day more than normal) AND [2] age > 60 years  Answer Assessment - Initial Assessment Questions 1. DIARRHEA SEVERITY: "How bad is the diarrhea?" "How many more stools have you had in the past 24 hours than normal?"    - NO DIARRHEA (SCALE 0)   - MILD (SCALE 1-3): Few loose or mushy BMs; increase of 1-3 stools over normal daily number of stools; mild increase in ostomy output.   -  MODERATE (SCALE 4-7): Increase of 4-6 stools daily over normal; moderate increase in ostomy output.   -  SEVERE (SCALE 8-10; OR "WORST POSSIBLE"): Increase of 7 or more stools daily over normal; moderate increase in ostomy output; incontinence.     8 at least 2. ONSET: "When did the diarrhea begin?"      5 days ago 3. BM CONSISTENCY: "How loose or watery is the diarrhea?"      Watery at first, now loose 4. VOMITING: "Are you also vomiting?" If Yes, ask: "How many times in the past 24 hours?"      No 5. ABDOMEN PAIN: "Are you having any abdomen pain?" If Yes, ask: "What does it feel like?" (e.g., crampy, dull, intermittent, constant)      Cramps before BM 6. ABDOMEN PAIN SEVERITY: If present, ask: "How bad is the pain?"  (e.g., Scale 1-10; mild, moderate, or severe)   - MILD (1-3): doesn't interfere with normal activities, abdomen soft and not tender to touch    - MODERATE (4-7): interferes with normal activities or awakens from sleep, abdomen tender to touch    - SEVERE (8-10): excruciating pain, doubled over, unable to do any normal activities       Cramping and bloating. Tender to touch 7. ORAL INTAKE: If vomiting, "Have you been able to drink liquids?" "How much liquids have you had in the past 24 hours?"     NA 8. HYDRATION: "Any signs of dehydration?" (e.g., dry mouth [not just dry lips], too weak to stand, dizziness, new weight loss) "When did you last urinate?"     Generalized weakness  10. ANTIBIOTIC USE: "Are you taking antibiotics now  or have you taken antibiotics in the past 2 months?"       1 month ago on antibiotics from dentist 11. OTHER SYMPTOMS: "Do you have any other symptoms?" (e.g., fever, blood in stool)       Generalized weakness, "Stool very light."  Protocols used: Diarrhea-A-AH

## 2022-04-29 NOTE — Telephone Encounter (Signed)
  Chief Complaint: Diarrhea Symptoms: Diarrhea, severe >8 in 24 hours. Watery at fist, some form presently. Was on course of antibiotics last month for dental procedure. Bloating, cramping, generalized weakness Frequency: 5 days Pertinent Negatives: Patient denies fever Disposition: '[]'$ ED /'[x]'$ Urgent Care (no appt availability in office) / '[]'$ Appointment(In office/virtual)/ '[]'$  [facility]/ '[]'$ Home Care/ '[]'$ Refused Recommended Disposition /'[]'$ [facility]/ '[]'$  Follow-up with PCP Additional Notes: No availability, advised UC. States will follow disposition. Care advise provided, pt verbalizes understanding.

## 2022-05-01 ENCOUNTER — Other Ambulatory Visit: Payer: Self-pay

## 2022-05-01 ENCOUNTER — Encounter (HOSPITAL_COMMUNITY): Payer: Self-pay | Admitting: *Deleted

## 2022-05-01 ENCOUNTER — Ambulatory Visit (HOSPITAL_COMMUNITY)
Admission: EM | Admit: 2022-05-01 | Discharge: 2022-05-01 | Disposition: A | Discharge status: Home / Self Care | Payer: Medicare HMO | Attending: Family Medicine | Admitting: Family Medicine

## 2022-05-01 DIAGNOSIS — Z112 Encounter for screening for other bacterial diseases: Secondary | ICD-10-CM | POA: Insufficient documentation

## 2022-05-01 DIAGNOSIS — R195 Other fecal abnormalities: Secondary | ICD-10-CM | POA: Insufficient documentation

## 2022-05-01 DIAGNOSIS — A0472 Enterocolitis due to Clostridium difficile, not specified as recurrent: Secondary | ICD-10-CM | POA: Insufficient documentation

## 2022-05-01 DIAGNOSIS — Z792 Long term (current) use of antibiotics: Secondary | ICD-10-CM | POA: Insufficient documentation

## 2022-05-01 DIAGNOSIS — R197 Diarrhea, unspecified: Secondary | ICD-10-CM | POA: Diagnosis not present

## 2022-05-01 LAB — CBC
HCT: 37.2 % — ABNORMAL LOW (ref 39.0–52.0)
Hemoglobin: 13 g/dL (ref 13.0–17.0)
MCH: 30.2 pg (ref 26.0–34.0)
MCHC: 34.9 g/dL (ref 30.0–36.0)
MCV: 86.5 fL (ref 80.0–100.0)
Platelets: 341 10*3/uL (ref 150–400)
RBC: 4.3 MIL/uL (ref 4.22–5.81)
RDW: 14.2 % (ref 11.5–15.5)
WBC: 5.6 10*3/uL (ref 4.0–10.5)
nRBC: 0 % (ref 0.0–0.2)

## 2022-05-01 LAB — COMPREHENSIVE METABOLIC PANEL
ALT: 9 U/L (ref 0–44)
AST: 15 U/L (ref 15–41)
Albumin: 3.5 g/dL (ref 3.5–5.0)
Alkaline Phosphatase: 63 U/L (ref 38–126)
Anion gap: 12 (ref 5–15)
BUN: 10 mg/dL (ref 8–23)
CO2: 22 mmol/L (ref 22–32)
Calcium: 9.1 mg/dL (ref 8.9–10.3)
Chloride: 103 mmol/L (ref 98–111)
Creatinine, Ser: 1.25 mg/dL — ABNORMAL HIGH (ref 0.61–1.24)
GFR, Estimated: >60 mL/min (ref >60)
Glucose, Bld: 144 mg/dL — ABNORMAL HIGH (ref 70–99)
Potassium: 3.4 mmol/L — ABNORMAL LOW (ref 3.5–5.1)
Sodium: 137 mmol/L (ref 135–145)
Total Bilirubin: 0.1 mg/dL — ABNORMAL LOW (ref 0.3–1.2)
Total Protein: 6.8 g/dL (ref 6.5–8.1)

## 2022-05-01 MED ORDER — DICYCLOMINE HCL 20 MG PO TABS
20.0000 mg | ORAL_TABLET | Freq: Four times a day (QID) | ORAL | 0 refills | Status: AC | PRN
  Filled 2022-05-01: qty 40, 10d supply, fill #0

## 2022-05-01 NOTE — Discharge Instructions (Signed)
Staff will notify you if there is anything abnormal that needs treatment on your blood work.  You can bring your stool sample here.  Staff will notify you if it is positive for an infection that needs to be treated  Continue to drink fluids and get a little bit of bland food and  Dicyclomine--take 1 every 6 hours as needed for intestinal cramps

## 2022-05-01 NOTE — ED Provider Notes (Signed)
[place]    CSN: [id number] Arrival date & time: 05/01/22  1507      History   Chief Complaint Chief Complaint  Patient presents with   Diarrhea    HPI [name] is a 71 y.o. male.    Diarrhea  Here for diarrhea that is been going on for 7 days.  He has had 8-10 loose or watery stools daily since this began.  He is having some bloating and some abdominal cramps.  No fever or chills or vomiting.  He has been able to eat some and to drink plenty of fluids.  He is feeling weak some and dizzy some.  He did take clindamycin late in October.  Past Medical History:  Diagnosis Date   Abscess of anal and rectal regions 07/15/2007   Qualifier: Diagnosis of  By: Amil Amen MD, Elizabeth     Atrial fibrillation [facility])    Diabetes mellitus without complication ([place]) 0051   type 2   FISTULA, ANAL 01/27/2007   Qualifier: Diagnosis of  By: Amil Amen MD, Elizabeth     GERD (gastroesophageal reflux disease)    Hypertension Dx 2008   Prostate cancer ([place])    Spontaneous pneumothorax    Stroke [facility])     Patient Active Problem List   Diagnosis Date Noted   Lumbar radiculopathy 03/11/2021   Plantar flexed metatarsal, left 07/30/2020   Plantar flexed metatarsal, right 07/30/2020   Tobacco dependence 05/27/2020   History of pneumothorax 05/27/2020   Pneumothorax 05/10/2020   Blood clotting disorder ([place]) 04/23/2020   Stressful life event affecting family 01/29/2020   Hyperlipidemia associated with type 2 diabetes mellitus ([place]) 01/29/2020   Personal history of fall 01/29/2020   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   Unintended weight loss 01/29/2020   Pain due to onychomycosis of toenails of both feet 08/01/2019   Porokeratosis 08/01/2019   Influenza vaccine needed 02/24/2019   Diabetes mellitus ([place]) 02/24/2019   Malignant neoplasm of prostate ([place]) 09/23/2018   Hyperlipemia 11/24/2016   History of CVA with residual deficit  11/24/2016   GERD (gastroesophageal reflux disease) 08/09/2014   Atrial fibrillation ([place])    Hemiparesis affecting left side as late effect of stroke ([place]) 05/01/2014   ERECTILE DYSFUNCTION, 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   DEGENERATIVE JOINT DISEASE, CERVICAL SPINE 04/24/2009   DM2 (diabetes mellitus, type 2) ([place]) 07/03/2006   Former smoker 07/03/2006   HIATAL HERNIA WITH REFLUX 07/03/2006   Essential hypertension 06/08/2006    Past Surgical History:  Procedure Laterality Date   CATARACT EXTRACTION  06/2017   CYSTOSCOPY  05/2017   INTERCOSTAL NERVE BLOCK Left 05/13/2020   Procedure: INTERCOSTAL NERVE BLOCK;  Surgeon: Lajuana Matte, MD;  Location: [facility];  Service: Thoracic;  Laterality: Left;   LUMBAR LAMINECTOMY/DECOMPRESSION MICRODISCECTOMY Right 03/11/2021   Procedure: Microdiscectomy - right - Lumbar four-Lumbar five extraforaminal;  Surgeon: Earnie Larsson, MD;  Location: [place];  Service: Neurosurgery;  Laterality: Right;   LUMBAR LAMINECTOMY/DECOMPRESSION MICRODISCECTOMY Left 12/29/2021   Procedure: Extraforaminal microdiscectomy - left - L4-L5;  Surgeon: Earnie Larsson, MD;  Location: [place];  Service: Neurosurgery;  Laterality: Left;  3C   PROSTATE BIOPSY     WEDGE RESECTION Left 05/13/2020   XI robotic assisted thorascopy       Home Medications    Prior to Admission medications   Medication Sig Start Date End Date Taking? Authorizing Provider  amLODipine (NORVASC) 10 MG tablet Take 1 tablet (10 mg total) by mouth daily. 11/20/21  Yes Freeman Caldron M, PA-C  apixaban (ELIQUIS) 5 MG TABS tablet TAKE 1 TABLET BY MOUTH TWICE DAILY. 11/20/21 11/20/22 Yes McClung, Dionne Bucy, PA-C  B Complex Vitamins (VITAMIN B COMPLEX) TABS Take 1 tablet by mouth daily.   Yes [provider]  diclofenac Sodium (VOLTAREN) 1 % GEL Apply 2 g topically 3 (three) times daily as needed (pain). 11/14/21  Yes [name], MD  dicyclomine (BENTYL) 20 MG tablet Take 1 tablet (20 mg total) by mouth 4  (four) times daily as needed (intestinal cramps). 05/01/22  Yes Barrett Henle, MD  fluticasone (FLONASE) 50 MCG/ACT nasal spray Place 1 spray into both nostrils daily as needed for allergies or rhinitis.   Yes [provider]  gabapentin (NEURONTIN) 100 MG capsule TAKE 1 CAPSULE (100 MG TOTAL) BY MOUTH 3 (THREE) TIMES DAILY. NEEDED FOR NUMBNESS 11/20/21 11/20/22 Yes McClung, Dionne Bucy, PA-C  glimepiride (AMARYL) 1 MG tablet Take 1 tablet (1 mg total) by mouth daily with breakfast. 11/20/21  Yes McClung, Dionne Bucy, PA-C  hydrOXYzine (VISTARIL) 25 MG capsule Take 1 capsule (25 mg total) by mouth 2 (two) times daily as needed. 11/20/21  Yes McClung, Dionne Bucy, PA-C  latanoprost (XALATAN) 0.005 % ophthalmic solution Place 1 drop into both eyes nightly. 04/10/21  Yes   Multiple Vitamin (MULTIVITAMIN WITH MINERALS) TABS tablet Take 1 tablet by mouth daily.   Yes [provider]  pravastatin (PRAVACHOL) 20 MG tablet Take 1 tablet (20 mg total) by mouth daily. 04/27/22  Yes [name], MD  HYDROcodone-acetaminophen (NORCO) 10-325 MG tablet Take 1 tablet by mouth every 6 (six) hours as needed. 12/29/21   Earnie Larsson, MD  latanoprost (XALATAN) 0.005 % ophthalmic solution Place 1 drop into both eyes nightly. 06/24/21     methocarbamol (ROBAXIN) 750 MG tablet Take 1 tablet (750 mg total) by mouth 3 (three) times daily as needed (muscle spasm/pain). 11/17/21   Lajean Saver, MD  sennosides-docusate sodium (SENOKOT-S) 8.6-50 MG tablet Take 1 tablet by mouth daily. 03/13/21   [name], MD  tamsulosin (FLOMAX) 0.4 MG CAPS capsule Take 1 capsule (0.4 mg total) by mouth daily after supper. 05/11/21   Tyler Pita, MD    Family History Family History  Problem Relation Age of Onset   Diabetes Mother    Breast cancer Mother    Diabetes Father    Breast cancer Sister    Prostate cancer Neg Hx    Colon cancer Neg Hx    Pancreatic cancer Neg Hx     Social History Social History    Tobacco Use   Smoking status: Former    Packs/day: 0.25    Years: 10.00    Total pack years: 2.50    Types: Cigarettes   Smokeless tobacco: Never   Tobacco comments:    Quit 05/15/14  Vaping Use   Vaping Use: Never used  Substance Use Topics   Alcohol use: Yes    Alcohol/week: 1.0 - 2.0 standard drink of alcohol    Types: 1 - 2 Standard drinks or equivalent per week    Comment: occasional wine   Drug use: No     Allergies   Lisinopril and Tramadol hcl   Review of Systems Review of Systems  Gastrointestinal:  Positive for diarrhea.     Physical Exam Triage Vital Signs ED Triage Vitals  Enc Vitals Group     BP 05/01/22 1555 (!) 158/91     Pulse Rate 05/01/22 1555 90  Resp 05/01/22 1555 18     Temp 05/01/22 1555 98.3 F (36.8 C)     Temp Source 05/01/22 1555 Oral     SpO2 05/01/22 1555 100 %     Weight --      Height --      Head Circumference --      Peak Flow --      Pain Score 05/01/22 1552 7     Pain Loc --      Pain Edu? --      Excl. in [place]? --    No data found.  Updated Vital Signs BP (!) 158/91 (BP Location: Right Arm)   Pulse 90   Temp 98.3 F (36.8 C) (Oral)   Resp 18   SpO2 100%   Visual Acuity Right Eye Distance:   Left Eye Distance:   Bilateral Distance:    Right Eye Near:   Left Eye Near:    Bilateral Near:     Physical Exam Vitals reviewed.  Constitutional:      General: He is not in acute distress.    Appearance: He is not ill-appearing, toxic-appearing or diaphoretic.  HENT:     Nose: Nose normal.     Mouth/Throat:     Mouth: Mucous membranes are moist.     Pharynx: No oropharyngeal exudate or posterior oropharyngeal erythema.  Eyes:     Extraocular Movements: Extraocular movements intact.     Conjunctiva/sclera: Conjunctivae normal.     Pupils: Pupils are equal, round, and reactive to light.  Cardiovascular:     Rate and Rhythm: Normal rate and regular rhythm.     Heart sounds: No murmur heard. Pulmonary:      Effort: No respiratory distress.     Breath sounds: No stridor. No wheezing, rhonchi or rales.  Abdominal:     Palpations: Abdomen is soft.     Tenderness: There is no guarding.     Comments: There is some tenderness of the suprapubic area  Musculoskeletal:     Cervical back: Neck supple.  Lymphadenopathy:     Cervical: No cervical adenopathy.  Skin:    Coloration: Skin is not jaundiced or pale.  Neurological:     General: No focal deficit present.     Mental Status: He is alert and oriented to person, place, and time.  Psychiatric:        Behavior: Behavior normal.      UC Treatments / Results  Labs (all labs ordered are listed, but only abnormal results are displayed) Labs Reviewed  CBC  COMPREHENSIVE METABOLIC PANEL    EKG   Radiology No results found.  Procedures Procedures (including critical care time)  Medications Ordered in UC Medications - No data to display  Initial Impression / Assessment and Plan / UC Course  I have reviewed the triage vital signs and the nursing notes.  Pertinent labs & imaging results that were available during my care of the patient were reviewed by me and considered in my medical decision making (see chart for details).        Discussed with him the possibility of C. difficile colitis.  Stool sample is ordered and basic lab is done Final Clinical Impressions(s) / UC Diagnoses   Final diagnoses:  Diarrhea, unspecified type     Discharge Instructions      Staff will notify you if there is anything abnormal that needs treatment on your blood work.  You can bring your stool sample here.  Staff  will notify you if it is positive for an infection that needs to be treated  Continue to drink fluids and get a little bit of bland food and  Dicyclomine--take 1 every 6 hours as needed for intestinal cramps       ED Prescriptions     Medication Sig Dispense Auth. Provider   dicyclomine (BENTYL) 20 MG tablet Take 1 tablet  (20 mg total) by mouth 4 (four) times daily as needed (intestinal cramps). 40 tablet Alethea Terhaar, Gwenlyn Perking, MD      PDMP not reviewed this encounter.   Barrett Henle, MD 05/01/22 [id number]

## 2022-05-01 NOTE — ED Triage Notes (Signed)
Pt states that he has had diarrhea x 1 week some abdominal cramping and weakness. The last antibiotic he was on was 03/2022 that was clindamycin he did not finish the rx. He did not have diarrhea while on the antibiotic. He has been taking imodium AD with some relief. He is having 8 BM a day.

## 2022-05-02 ENCOUNTER — Telehealth (HOSPITAL_COMMUNITY): Payer: Self-pay | Admitting: Physician Assistant

## 2022-05-02 LAB — GASTROINTESTINAL PANEL BY PCR, STOOL (REPLACES STOOL CULTURE)

## 2022-05-02 LAB — C DIFFICILE QUICK SCREEN W PCR REFLEX
C Diff antigen: POSITIVE — AB
C Diff interpretation: DETECTED
C Diff toxin: POSITIVE — AB

## 2022-05-02 MED ORDER — DIFICID 200 MG PO TABS
200.0000 mg | ORAL_TABLET | Freq: Two times a day (BID) | ORAL | 0 refills | Status: DC
Start: 2022-05-02 — End: 2022-06-23

## 2022-05-02 NOTE — Telephone Encounter (Signed)
Nursing staff routed the C. Diff results to the provider in clinic today.  I call the patient and advised him that the C. Diff test was positive.  This is contributing to his symptoms.  I advised patient to take the Dificid '200mg'$  bid for 10 days to help the C. Diff infection to resolve.  Patient advised to return if any problems.  Djames PA-C

## 2022-05-04 ENCOUNTER — Ambulatory Visit: Payer: Medicare HMO | Attending: Internal Medicine | Admitting: Internal Medicine

## 2022-05-04 ENCOUNTER — Ambulatory Visit: Payer: Self-pay | Admitting: *Deleted

## 2022-05-04 ENCOUNTER — Telehealth: Payer: Self-pay | Admitting: Internal Medicine

## 2022-05-04 NOTE — Telephone Encounter (Signed)
Copied from [place] [id number]. Topic: Appointment Scheduling - Scheduling Inquiry for Clinic >> May 04, 2022  1:50 PM Charlotte Sanes J wrote: Reason for CRM: Pt was scheduled for a telephone appt today at 11:10 / he missed that call and returned the missed call / he stated that that appt was suppose to be cancelled due to him just seeing Dr. [name] for his AWV / he was advised he didn't need that appt / pt wants to make it know so he doesn't receive a no show / please advise

## 2022-05-04 NOTE — Telephone Encounter (Signed)
Pt is calling back [name]. Please advise

## 2022-05-04 NOTE — Telephone Encounter (Signed)
  Chief Complaint: Rash Symptoms: Red raised rash, small, dry papules at neck, lower abdomen, pubic area, itchy. Unsure when started,new meds from UC Dicyclomine and Dificid from [facility] 04/29/22. LAst took Dificid Friday. Diarrhea has resolved.  Frequency: Unsure, on ATBs from dentist 2 weeks ago Pertinent Negatives: Patient denies swelling, pain, no oral swelling Disposition: '[]'$ ED /'[]'$ Urgent Care (no appt availability in office) / '[]'$ Appointment(In office/virtual)/ '[]'$  [place] [facility]/ '[]'$ Home Care/ '[]'$ Refused Recommended Disposition /'[]'$ [place] Mobile Bus/ '[x]'$  Follow-up with PCP Additional Notes: Please advise pt regarding meds. Care advise provided, pt verbalizes understanding. Is holding meds until here's from PCP.  Reason for Disposition  SEVERE itching (i.e., interferes with sleep, normal activities or school)  Answer Assessment - Initial Assessment Questions 1. APPEARANCE of RASH: "Describe the rash." (e.g., spots, blisters, raised areas, skin peeling, scaly)     Dry,reddish,few "Welts on neck" 2. SIZE: "How big are the spots?" (e.g., tip of pen, eraser, coin; inches, centimeters)     Vary 3. LOCATION: "Where is the rash located?"     Neck lower abdomen,pubic area 4. COLOR: "What color is the rash?" (Note: It is difficult to assess rash color in people with darker-colored skin. When this situation occurs, simply ask the caller to describe what they see.)     reddish 5. ONSET: "When did the rash begin?"     Started Dificid Friday, does not recall when rash started, had ABTs from dentist Oct 26th, finished 2 weeks ago. 6. FEVER: "Do you have a fever?" If Yes, ask: "What is your temperature, how was it measured, and when did it start?"     no 7. ITCHING: "Does the rash itch?" If Yes, ask: "How bad is the itch?" (Scale 1-10; or mild, moderate, severe)     Yes moderate 8. CAUSE: "What do you think is causing the rash?"     Meds 9. MEDICINE FACTORS: "Have you started any new medicines  within the last 2 weeks?" (e.g., antibiotics)      Yes Dicyclomine  and Dificid 10. OTHER SYMPTOMS: "Do you have any other symptoms?" (e.g., dizziness, headache, sore throat, joint pain)       no  Protocols used: Rash or Redness - [facility]

## 2022-05-04 NOTE — Telephone Encounter (Signed)
Did you talk to pt this morning.

## 2022-05-04 NOTE — Telephone Encounter (Signed)
Tried reaching pt to do a telephone visit.  LVMM of who I am and reason for calling.  Will try later.

## 2022-05-05 ENCOUNTER — Other Ambulatory Visit: Payer: Self-pay

## 2022-05-05 ENCOUNTER — Ambulatory Visit: Payer: Medicare HMO | Attending: Internal Medicine | Admitting: Internal Medicine

## 2022-05-05 DIAGNOSIS — L309 Dermatitis, unspecified: Secondary | ICD-10-CM

## 2022-05-05 DIAGNOSIS — A0472 Enterocolitis due to Clostridium difficile, not specified as recurrent: Secondary | ICD-10-CM

## 2022-05-05 MED ORDER — TRIAMCINOLONE ACETONIDE 0.1 % EX CREA: 1.0000 | TOPICAL_CREAM | Freq: Two times a day (BID) | CUTANEOUS | 0 refills | Status: DC

## 2022-05-05 NOTE — Progress Notes (Signed)
Patient ID: [name], male   DOB: 02/10/1951, 71 y.o.   MRN: [id number] Virtual Visit via Telephone Note  I connected with [name] on 05/05/2022 at 8:21 AM by telephone and verified that I am speaking with the correct person using two identifiers  Location: Patient: home Provider: office  Participants: Myself Patient   I discussed the limitations, risks, security and privacy concerns of performing an evaluation and management service by telephone and the availability of in person appointments. I also discussed with the patient that there may be a patient responsible charge related to this service. The patient expressed understanding and agreed to proceed.   History of Present Illness: Pt with hx of HTN, DM, CKD 2, CVA with Lt sided weakness 04/2014, a.fib on DOAG, HL, prostate CA ( XRT seeds implanted for XRT). cocaine abuse in remission since 2005, LT pneumothorax, POAG( BL glaucoma).    Pt seen in 05/01/22 for diarrhea and abdominal bloating. Diagnosed with C. difficile.  Prescribed Dificid 200 mg to take BID x 10 days and Bentyl.  Picked up the prescription 05/02/2022 and started taking.  However at the time the pharmacy only had 2 days worth of the Dificid.  Diarrhea and bloating resolved as of 05/03/2022.  He has not had any fever.  He plans to pick up the remaining 8 daily dosage of Dificid.  Patient called in yesterday complaining of having a rash over the lower abdomen and around the neck.  Patient states that this started sometime Travis week prior to him being started on antibiotics.  The rash is very itchy.  He describes some fine bumps but they are not fluid-filled.  No initiating factors that he can identify.  He started using triple antibiotic ointment on it which did not help.  Purchased some Benadryl cream over-the-counter yesterday and this is helped significantly in decreasing the itching.  Outpatient Encounter Medications as of 05/05/2022  Medication Sig    amLODipine (NORVASC) 10 MG tablet Take 1 tablet (10 mg total) by mouth daily.   apixaban (ELIQUIS) 5 MG TABS tablet TAKE 1 TABLET BY MOUTH TWICE DAILY.   B Complex Vitamins (VITAMIN B COMPLEX) TABS Take 1 tablet by mouth daily.   diclofenac Sodium (VOLTAREN) 1 % GEL Apply 2 g topically 3 (three) times daily as needed (pain).   dicyclomine (BENTYL) 20 MG tablet Take 1 tablet (20 mg total) by mouth 4 (four) times daily as needed (intestinal cramps).   fidaxomicin (DIFICID) 200 MG TABS tablet Take 1 tablet (200 mg total) by mouth 2 (two) times daily.   fluticasone (FLONASE) 50 MCG/ACT nasal spray Place 1 spray into both nostrils daily as needed for allergies or rhinitis.   gabapentin (NEURONTIN) 100 MG capsule TAKE 1 CAPSULE (100 MG TOTAL) BY MOUTH 3 (THREE) TIMES DAILY. NEEDED FOR NUMBNESS   glimepiride (AMARYL) 1 MG tablet Take 1 tablet (1 mg total) by mouth daily with breakfast.   HYDROcodone-acetaminophen (NORCO) 10-325 MG tablet Take 1 tablet by mouth every 6 (six) hours as needed.   hydrOXYzine (VISTARIL) 25 MG capsule Take 1 capsule (25 mg total) by mouth 2 (two) times daily as needed.   latanoprost (XALATAN) 0.005 % ophthalmic solution Place 1 drop into both eyes nightly.   latanoprost (XALATAN) 0.005 % ophthalmic solution Place 1 drop into both eyes nightly.   methocarbamol (ROBAXIN) 750 MG tablet Take 1 tablet (750 mg total) by mouth 3 (three) times daily as needed (muscle spasm/pain).   Multiple Vitamin (MULTIVITAMIN  WITH MINERALS) TABS tablet Take 1 tablet by mouth daily.   pravastatin (PRAVACHOL) 20 MG tablet Take 1 tablet (20 mg total) by mouth daily.   sennosides-docusate sodium (SENOKOT-S) 8.6-50 MG tablet Take 1 tablet by mouth daily.   tamsulosin (FLOMAX) 0.4 MG CAPS capsule Take 1 capsule (0.4 mg total) by mouth daily after supper.   No facility-administered encounter medications on file as of 05/05/2022.      Observations/Objective: No direct observation done as this was a  telephone visit.  Assessment and Plan: 1. C. difficile diarrhea Advised patient to pick up the remaining 8 days worth of Dicifid and take to completion to prevent reoccurrence.  He expressed understanding.  He plans to pick it up today.  2. Dermatitis Does not sound like shingles.  We will try him with triamcinolone cream.  Advised to call and let me know if this does not resolve.   Follow Up Instructions: PRN   I discussed the assessment and treatment plan with the patient. The patient was provided an opportunity to ask questions and all were answered. The patient agreed with the plan and demonstrated an understanding of the instructions.   The patient was advised to call back or seek an in-person evaluation if the symptoms worsen or if the condition fails to improve as anticipated.  I  Spent 13 minutes on this telephone encounter  This note has been created with Surveyor, quantity. Any transcriptional errors are unintentional.  Karle Plumber, MD

## 2022-05-05 NOTE — Telephone Encounter (Signed)
Patient had a telephone appt. Today with PCP.

## 2022-05-11 ENCOUNTER — Telehealth: Payer: Self-pay | Admitting: Internal Medicine

## 2022-05-11 ENCOUNTER — Ambulatory Visit: Payer: Self-pay

## 2022-05-11 ENCOUNTER — Telehealth: Payer: Self-pay | Admitting: Emergency Medicine

## 2022-05-11 NOTE — Telephone Encounter (Signed)
I have not heard of that and we cannot release any information without a signed release to do so.  Patient advise to come to office to sign medical records release form.

## 2022-05-11 NOTE — Telephone Encounter (Signed)
Patient is calling to report that [facility] reports that both parts of the AWV has been completed. CB [phone]

## 2022-05-11 NOTE — Telephone Encounter (Signed)
We cannot release any information without a signed release to do so.   Patient advised to come to office to sign release.

## 2022-05-11 NOTE — Telephone Encounter (Signed)
Copied from [place] [id number]. Topic: General - Other >> May 11, 2022 11:19 AM Everette C wrote: Reason for CRM: The patient has called back to share that the patient's AWV information can be faxed to their insurance provider at [phone] Attention [place]   Please contact further when possible

## 2022-05-11 NOTE — Telephone Encounter (Signed)
Patient states [employer] had not made note to the completion of the AWV.   Patient informed that AWV was 04/17/2022. It was only one appointment needed. He did not need 2 parts for that appt.  Verbalized understanding.

## 2022-05-11 NOTE — Telephone Encounter (Signed)
  Chief Complaint: medication assistance  Symptoms: NA Frequency: today Pertinent Negatives: Patient denies diarrhea or abdominal pain  Disposition: '[]'$ ED /'[]'$ Urgent Care (no appt availability in office) / '[]'$ Appointment(In office/virtual)/ '[]'$  [place] [facility]/ '[x]'$ Home Care/ '[]'$ Refused Recommended Disposition /'[]'$  Mobile Bus/ '[]'$  Follow-up with PCP Additional Notes: pt was prescribed Dificid from UC on 05/02/22. Advised pt to continue taking since tomorrow last done anyways. Did recommend he can go back to regular diet so no diarrhea recently. Pt verbalized understanding and said he would let us know if any symptoms return.   Summary: CDiff Medication & Advice   Pt is calling to report that he was prescribed fidaxomicin (DIFICID) 200 MG TABS tablet [[id number]] from the UC. Report stool is solid, no temp, Should he continue taking the medication? Is he clear? Can he return to regular diet?     Reason for Disposition  Caller has medicine question only, adult not sick, AND triager answers question  Answer Assessment - Initial Assessment Questions 1. NAME of MEDICINE: "What medicine(s) are you calling about?"     Dificid  2. QUESTION: "What is your question?" (e.g., double dose of medicine, side effect)     Complete abx or eat regular diet.  3. PRESCRIBER: "Who prescribed the medicine?" Reason: if prescribed by specialist, call should be referred to that group.     UC provider  4. SYMPTOMS: "Do you have any symptoms?" If Yes, ask: "What symptoms are you having?"  "How bad are the symptoms (e.g., mild, moderate, severe)     NA  Protocols used: Medication Question Call-A-AH

## 2022-05-11 NOTE — Telephone Encounter (Signed)
Copied from [place] [id number]. Topic: General - Other >> May 11, 2022 11:09 AM Everette C wrote: Reason for CRM: The patient has been directed by their insurance provider to provide a completed copy of the summary of their AWV or some form of proof  The patient's chart indicates that the visit was completed on 04/17/22  Please contact the patient further when possible

## 2022-05-13 ENCOUNTER — Telehealth: Payer: Self-pay | Admitting: Internal Medicine

## 2022-05-13 NOTE — Telephone Encounter (Signed)
Rolene Arbour Nurse Case Manager at [facility] is calling to report that she did a medicare wellness visit with the patient. Not allowing her to put Dr [name] down as the primary to sign off. The encounter provider is a generic provider. Please advise CB- [phone]

## 2022-05-19 ENCOUNTER — Other Ambulatory Visit: Payer: Self-pay | Admitting: Physician Assistant

## 2022-05-19 ENCOUNTER — Other Ambulatory Visit: Payer: Self-pay

## 2022-05-19 ENCOUNTER — Encounter: Payer: Medicare HMO | Admitting: Internal Medicine

## 2022-05-19 DIAGNOSIS — F419 Anxiety disorder, unspecified: Secondary | ICD-10-CM

## 2022-05-19 MED ORDER — HYDROXYZINE PAMOATE 25 MG PO CAPS
25.0000 mg | ORAL_CAPSULE | Freq: Two times a day (BID) | ORAL | 1 refills | Status: DC | PRN
  Filled 2022-05-19: qty 60, 30d supply, fill #0
  Filled 2022-07-03: qty 60, 30d supply, fill #1

## 2022-05-22 ENCOUNTER — Other Ambulatory Visit: Payer: Self-pay

## 2022-06-01 DIAGNOSIS — C61 Malignant neoplasm of prostate: Secondary | ICD-10-CM | POA: Diagnosis not present

## 2022-06-08 ENCOUNTER — Ambulatory Visit (INDEPENDENT_AMBULATORY_CARE_PROVIDER_SITE_OTHER): Payer: Medicare HMO | Admitting: Podiatry

## 2022-06-08 ENCOUNTER — Encounter: Payer: Self-pay | Admitting: Podiatry

## 2022-06-08 DIAGNOSIS — D689 Coagulation defect, unspecified: Secondary | ICD-10-CM

## 2022-06-08 DIAGNOSIS — M79674 Pain in right toe(s): Secondary | ICD-10-CM | POA: Diagnosis not present

## 2022-06-08 DIAGNOSIS — E1149 Type 2 diabetes mellitus with other diabetic neurological complication: Secondary | ICD-10-CM

## 2022-06-08 DIAGNOSIS — M79675 Pain in left toe(s): Secondary | ICD-10-CM | POA: Diagnosis not present

## 2022-06-08 DIAGNOSIS — B351 Tinea unguium: Secondary | ICD-10-CM

## 2022-06-08 NOTE — Progress Notes (Signed)
This patient returns to my office for at risk foot care.  This patient requires this care by a professional since this patient will be at risk due to having diabetes.  This patient is unable to cut nails himself since the patient cannot reach his nails.These nails are painful walking and wearing shoes.  This patient presents for at risk foot care today.  General Appearance  Alert, conversant and in no acute stress.  Vascular  Dorsalis pedis and posterior tibial  pulses are palpable  bilaterally.  Capillary return is within normal limits  bilaterally. Temperature is within normal limits  bilaterally.  Neurologic  Senn-Weinstein monofilament wire test diminished bilaterally. Muscle power within normal limits bilaterally.  Nails Thick disfigured discolored nails with subungual debris  from hallux to fifth toes bilaterally. No evidence of bacterial infection or drainage bilaterally.  Orthopedic  No limitations of motion  feet .  No crepitus or effusions noted.  No bony pathology or digital deformities noted.  Plantar flexed fifth metatarsal  B/L.  Skin  normotropic skin with no porokeratosis noted bilaterally.  No signs of infections or ulcers noted.   Asymptomatic callus sub 5th  B/l.  Onychomycosis  Pain in right toes  Pain in left toes    Consent was obtained for treatment procedures.   Mechanical debridement of nails 1-5  bilaterally performed with a nail nipper.  Filed with dremel without incident.    Return office visit   3 months                  Told patient to return for periodic foot care and evaluation due to potential at risk complications.   Gardiner Barefoot DPM

## 2022-06-09 ENCOUNTER — Ambulatory Visit: Payer: Medicare HMO | Admitting: Podiatry

## 2022-06-12 ENCOUNTER — Telehealth: Payer: Self-pay | Admitting: Internal Medicine

## 2022-06-12 ENCOUNTER — Other Ambulatory Visit: Payer: Self-pay

## 2022-06-12 ENCOUNTER — Ambulatory Visit: Payer: Self-pay

## 2022-06-12 MED ORDER — OMEPRAZOLE 20 MG PO CPDR
20.0000 mg | DELAYED_RELEASE_CAPSULE | Freq: Every day | ORAL | 3 refills | Status: DC
  Filled 2022-06-12: qty 30, 30d supply, fill #0

## 2022-06-12 NOTE — Telephone Encounter (Signed)
Patient called, left VM to return the call to the office to discuss symptoms with a nurse.  Summary: need indigestion medication   Pt states the head of his bed has not been working and he is unable to make it go in up position, and this has caused his acid reflux to act up.  It started last night.  Pt states several years ago Dr [name] prescribed famotidine (PEPCID) 20 MG tablet (05/2018) and that medication helped.  Pt states he knows w/ the holiday eating he will most definitely need. Please advise

## 2022-06-12 NOTE — Telephone Encounter (Signed)
Routing to PCP for review.

## 2022-06-12 NOTE — Telephone Encounter (Signed)
error 

## 2022-06-12 NOTE — Telephone Encounter (Signed)
  Chief Complaint: heartburn requesting medication pepcid  Symptoms: chest and throat burning, wakes up at night vomiting from burning sensation. Comes and goes. Reports heartburn. Denies other cardiac sx Frequency: greater than 1 week  Pertinent Negatives: Patient denies chest pain no difficulty breathing no dizziness no sweating no fever reported no cough Disposition: '[]'$ ED /'[]'$ Urgent Care (no appt availability in office) / '[]'$ Appointment(In office/virtual)/ '[]'$  [facility]/ '[]'$ Home Care/ '[]'$ Refused Recommended Disposition /'[]'$ [facility]/ '[x]'$  Follow-up with PCP Additional Notes:   No available appt today requesting medication pepcid.     Reason for Disposition  [1] Patient says chest pain feels exactly the same as previously diagnosed "heartburn" AND [2] describes burning in chest AND [3] accompanying sour taste in mouth  Answer Assessment - Initial Assessment Questions 1. LOCATION: "Where does it hurt?"       Denies chest pain but feels burning in throat  2. RADIATION: "Does the pain go anywhere else?" (e.g., into neck, jaw, arms, back)     na 3. ONSET: "When did the chest pain begin?" (Minutes, hours or days)      Discomfort started greater than 1 week  4. PATTERN: "Does the pain come and go, or has it been constant since it started?"  "Does it get worse with exertion?"      Comes and goes "periodically" 5. DURATION: "How long does it last" (e.g., seconds, minutes, hours)     na 6. SEVERITY: "How bad is the pain?"  (e.g., Scale 1-10; mild, moderate, or severe)    - MILD (1-3): doesn't interfere with normal activities     - MODERATE (4-7): interferes with normal activities or awakens from sleep    - SEVERE (8-10): excruciating pain, unable to do any normal activities       Burns  and causes to wake up and vomiting when laying flat in bed 7. CARDIAC RISK FACTORS: "Do you have any history of heart problems or risk factors for heart disease?" (e.g., angina, prior heart  attack; diabetes, high blood pressure, high cholesterol, smoker, or strong family history of heart disease)     Hx heart issues  8. PULMONARY RISK FACTORS: "Do you have any history of lung disease?"  (e.g., blood clots in lung, asthma, emphysema, birth control pills)     na 9. CAUSE: "What do you think is causing the chest pain?"     heartburn 10. OTHER SYMPTOMS: "Do you have any other symptoms?" (e.g., dizziness, nausea, vomiting, sweating, fever, difficulty breathing, cough)       Burning sensation  11. PREGNANCY: "Is there any chance you are pregnant?" "When was your last menstrual period?"       na  Protocols used: Chest Pain-A-AH

## 2022-06-12 NOTE — Telephone Encounter (Signed)
Patient aware that Rx was sent to pharmacy.

## 2022-06-17 ENCOUNTER — Other Ambulatory Visit: Payer: Self-pay

## 2022-06-20 ENCOUNTER — Ambulatory Visit (HOSPITAL_COMMUNITY)
Admission: EM | Admit: 2022-06-20 | Discharge: 2022-06-20 | Disposition: A | Discharge status: Home / Self Care | Payer: Medicare HMO | Attending: Urgent Care | Admitting: Urgent Care

## 2022-06-20 ENCOUNTER — Encounter (HOSPITAL_COMMUNITY): Payer: Self-pay

## 2022-06-20 DIAGNOSIS — K121 Other forms of stomatitis: Secondary | ICD-10-CM | POA: Diagnosis not present

## 2022-06-20 MED ORDER — ACYCLOVIR 400 MG PO TABS: 400.0000 mg | ORAL_TABLET | Freq: Every day | ORAL | 0 refills | Status: AC

## 2022-06-20 MED ORDER — TRIAMCINOLONE ACETONIDE 0.1 % MT PSTE: 1.0000 | PASTE | Freq: Two times a day (BID) | OROMUCOSAL | 0 refills | Status: DC

## 2022-06-20 MED ORDER — LIDOCAINE VISCOUS HCL 2 % MT SOLN: 5.0000 mL | Freq: Four times a day (QID) | OROMUCOSAL | 0 refills | Status: DC | PRN

## 2022-06-20 NOTE — ED Triage Notes (Signed)
Pt states he has mouth pain that stated a couple days ago.

## 2022-06-20 NOTE — Discharge Instructions (Addendum)
Your mouth pain is due to stomatitis, or ulcers in the mouth. Please read the attached handout. Please take the antiviral 5 times daily until gone. Please apply the topical triamcinolone dental paste to the affected ulcerations of your mouth for up to 1 week. Use the Magic mouthwash on an as-needed basis to help with oral pain. Please schedule follow-up with your dentist as soon as possible to ensure complete resolution.  You also have gingivitis which sometimes requires prescription mouthwash, but this should be given from your dental specialist.

## 2022-06-20 NOTE — ED Provider Notes (Signed)
[facility]    CSN: [id number] Arrival date & time: 06/20/22  1106      History   Chief Complaint Chief Complaint  Patient presents with   Dental Pain    HPI [name] is a 71 y.o. male.   Pleasant 71 year old male presents today due to concerns of ulcerations in his mouth.  He reports back in October he had a dental procedure performed in which he was put on he believes clindamycin.  States this caused him to have a horrible case of C. difficile.  Since having all of these dental issues and antibiotics, he reports intermittent and recurrent ulcerations in his mouth.  He reports the most painful 1 at present time is on the left side of his lower jaw on the medial aspect of his gums.  He also reports numerous additional ulcers to his buccal mucosa on his lower inside lip.  He has not taken any OTC meds for his symptoms. Denies dysphagia, drooling or lymphadenopathy. No swelling of face. No fever.    Dental Pain   Past Medical History:  Diagnosis Date   Abscess of anal and rectal regions 07/15/2007   Qualifier: Diagnosis of  By: Amil Amen MD, Elizabeth     Atrial fibrillation [facility])    Diabetes mellitus without complication ([place]) 8889   type 2   FISTULA, ANAL 01/27/2007   Qualifier: Diagnosis of  By: Amil Amen MD, Elizabeth     GERD (gastroesophageal reflux disease)    Hypertension Dx 2008   Prostate cancer ([place])    Spontaneous pneumothorax    Stroke [facility])     Patient Active Problem List   Diagnosis Date Noted   Lumbar radiculopathy 03/11/2021   Plantar flexed metatarsal, left 07/30/2020   Plantar flexed metatarsal, right 07/30/2020   Tobacco dependence 05/27/2020   History of pneumothorax 05/27/2020   Pneumothorax 05/10/2020   Blood clotting disorder ([place]) 04/23/2020   Stressful life event affecting family 01/29/2020   Hyperlipidemia associated with type 2 diabetes mellitus ([place]) 01/29/2020   Personal history of fall 01/29/2020   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   Unintended weight loss 01/29/2020   Pain due to onychomycosis of toenails of both feet 08/01/2019   Porokeratosis 08/01/2019   Influenza vaccine needed 02/24/2019   Diabetes mellitus ([place]) 02/24/2019   Malignant neoplasm of prostate ([place]) 09/23/2018   Hyperlipemia 11/24/2016   History of CVA with residual deficit 11/24/2016   GERD (gastroesophageal reflux disease) 08/09/2014   Atrial fibrillation ([place])    Hemiparesis affecting left side as late effect of stroke ([place]) 05/01/2014   ERECTILE DYSFUNCTION, 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) 07/03/2006   Former smoker 07/03/2006   HIATAL HERNIA WITH REFLUX 07/03/2006   Essential hypertension 06/08/2006    Past Surgical History:  Procedure Laterality Date   CATARACT EXTRACTION  06/2017   CYSTOSCOPY  05/2017   INTERCOSTAL NERVE BLOCK Left 05/13/2020   Procedure: INTERCOSTAL NERVE BLOCK;  Surgeon: Lajuana Matte, MD;  Location: [facility] OR;  Service: Thoracic;  Laterality: Left;   LUMBAR LAMINECTOMY/DECOMPRESSION MICRODISCECTOMY Right 03/11/2021   Procedure: Microdiscectomy - right - Lumbar four-Lumbar five extraforaminal;  Surgeon: Earnie Larsson, MD;  Location: [facility];  Service: Neurosurgery;  Laterality: Right;   LUMBAR LAMINECTOMY/DECOMPRESSION MICRODISCECTOMY Left 12/29/2021   Procedure: Extraforaminal microdiscectomy - left - L4-L5;  Surgeon: Earnie Larsson, MD;  Location: [facility];  Service: Neurosurgery;  Laterality: Left;  3C   PROSTATE BIOPSY     WEDGE RESECTION Left 05/13/2020   XI robotic assisted thorascopy       Home Medications    Prior to Admission medications   Medication Sig Start Date End Date Taking? Authorizing Provider  acyclovir (ZOVIRAX) 400 MG tablet Take 1 tablet (400 mg total) by mouth 5 (five) times daily for 5 days. 06/20/22 06/25/22 Yes Lorelle Macaluso L, PA  magic mouthwash (lidocaine,  diphenhydrAMINE, alum & mag hydroxide) suspension Swish and spit 5 mLs 4 (four) times daily as needed for mouth pain. 06/20/22  Yes Sargent Mankey L, PA  triamcinolone (KENALOG) 0.1 % paste Use as directed 1 Application in the mouth or throat 2 (two) times daily. 06/20/22  Yes Meril Dray L, PA  amLODipine (NORVASC) 10 MG tablet Take 1 tablet (10 mg total) by mouth daily. 11/20/21   Argentina Donovan, PA-C  apixaban (ELIQUIS) 5 MG TABS tablet TAKE 1 TABLET BY MOUTH TWICE DAILY. 11/20/21 11/20/22  Argentina Donovan, PA-C  B Complex Vitamins (VITAMIN B COMPLEX) TABS Take 1 tablet by mouth daily.    [provider]  diclofenac Sodium (VOLTAREN) 1 % GEL Apply 2 g topically 3 (three) times daily as needed (pain). 11/14/21   [name], MD  dicyclomine (BENTYL) 20 MG tablet Take 1 tablet (20 mg total) by mouth 4 (four) times daily as needed (intestinal cramps). 05/01/22   Barrett Henle, MD  fidaxomicin (DIFICID) 200 MG TABS tablet Take 1 tablet (200 mg total) by mouth 2 (two) times daily. 05/02/22   Nyoka Lint, PA-C  fluticasone [facility]) 50 MCG/ACT nasal spray Place 1 spray into both nostrils daily as needed for allergies or rhinitis.    [provider]  gabapentin (NEURONTIN) 100 MG capsule TAKE 1 CAPSULE (100 MG TOTAL) BY MOUTH 3 (THREE) TIMES DAILY. NEEDED FOR NUMBNESS 11/20/21 11/20/22  Argentina Donovan, PA-C  glimepiride (AMARYL) 1 MG tablet Take 1 tablet (1 mg total) by mouth daily with breakfast. 11/20/21   Argentina Donovan, PA-C  HYDROcodone-acetaminophen (NORCO) 10-325 MG tablet Take 1 tablet by mouth every 6 (six) hours as needed. 12/29/21   Earnie Larsson, MD  hydrOXYzine (VISTARIL) 25 MG capsule Take 1 capsule (25 mg total) by mouth 2 (two) times daily as needed. 05/19/22   [name], MD  latanoprost (XALATAN) 0.005 % ophthalmic solution Place 1 drop into both eyes nightly. 04/10/21     latanoprost (XALATAN) 0.005 % ophthalmic solution Place 1 drop into both eyes  nightly. 06/24/21     methocarbamol (ROBAXIN) 750 MG tablet Take 1 tablet (750 mg total) by mouth 3 (three) times daily as needed (muscle spasm/pain). 11/17/21   Lajean Saver, MD  Multiple Vitamin (MULTIVITAMIN WITH MINERALS) TABS tablet Take 1 tablet by mouth daily.    [provider]  omeprazole (PRILOSEC) 20 MG capsule Take 1 capsule (20 mg total) by mouth daily. 06/12/22   [name], MD  pravastatin (PRAVACHOL) 20 MG tablet Take 1 tablet (20 mg total) by mouth daily. 04/27/22   [name], MD  sennosides-docusate sodium (SENOKOT-S) 8.6-50 MG tablet Take 1 tablet by mouth daily. 03/13/21   [name], MD  tamsulosin (FLOMAX) 0.4 MG CAPS capsule Take 1 capsule (0.4 mg total) by mouth daily after supper. 05/11/21   Tyler Pita, MD  triamcinolone cream (KENALOG) 0.1 % Apply 1 Application topically 2 (two) times daily. 05/05/22   [name], MD    Family  History Family History  Problem Relation Age of Onset   Diabetes Mother    Breast cancer Mother    Diabetes Father    Breast cancer Sister    Prostate cancer Neg Hx    Colon cancer Neg Hx    Pancreatic cancer Neg Hx     Social History Social History   Tobacco Use   Smoking status: Former    Packs/day: 0.25    Years: 10.00    Total pack years: 2.50    Types: Cigarettes   Smokeless tobacco: Never   Tobacco comments:    Quit 05/15/14  Vaping Use   Vaping Use: Never used  Substance Use Topics   Alcohol use: Yes    Alcohol/week: 1.0 - 2.0 standard drink of alcohol    Types: 1 - 2 Standard drinks or equivalent per week    Comment: occasional wine   Drug use: No     Allergies   Lisinopril and Tramadol hcl   Review of Systems Review of Systems As per HPI  Physical Exam Triage Vital Signs ED Triage Vitals [06/20/22 1313]  Enc Vitals Group     BP (!) 159/85     Pulse Rate 96     Resp 16     Temp 98.1 F (36.7 C)     Temp Source Oral     SpO2 98 %     Weight      Height       Head Circumference      Peak Flow      Pain Score      Pain Loc      Pain Edu?      Excl. in [place]?    No data found.  Updated Vital Signs BP (!) 159/85 (BP Location: Left Arm)   Pulse 96   Temp 98.1 F (36.7 C) (Oral)   Resp 16   SpO2 98%   Visual Acuity Right Eye Distance:   Left Eye Distance:   Bilateral Distance:    Right Eye Near:   Left Eye Near:    Bilateral Near:     Physical Exam Vitals and nursing note reviewed.  Constitutional:      General: He is not in acute distress.    Appearance: Normal appearance. He is not ill-appearing, toxic-appearing or diaphoretic.  HENT:     Head: Normocephalic and atraumatic.     Jaw: There is normal jaw occlusion. No trismus or swelling.     Salivary Glands: Right salivary gland is not diffusely enlarged or tender. Left salivary gland is not diffusely enlarged or tender.     Right Ear: External ear normal.     Left Ear: External ear normal.     Nose: Nose normal.     Mouth/Throat:     Lips: Pink. No lesions.     Mouth: Mucous membranes are moist. Oral lesions (single oral lesion) present.     Dentition: Abnormal dentition. Gingival swelling, dental caries and gum lesions (4 lesions to buccal mucosa lower lip) present. No dental abscesses.     Tongue: No lesions.     Palate: No lesions.     Pharynx: Oropharynx is clear. Uvula midline. No pharyngeal swelling, oropharyngeal exudate or uvula swelling.   Cardiovascular:     Rate and Rhythm: Normal rate and regular rhythm.  Pulmonary:     Effort: Pulmonary effort is normal. No respiratory distress.  Musculoskeletal:     Cervical back: Normal range of motion and neck  supple. No rigidity or tenderness.  Lymphadenopathy:     Cervical: No cervical adenopathy.  Skin:    General: Skin is warm.     Findings: No rash.  Neurological:     General: No focal deficit present.     Mental Status: He is alert and oriented to person, place, and time.      UC Treatments / Results   Labs (all labs ordered are listed, but only abnormal results are displayed) Labs Reviewed - No data to display  EKG   Radiology No results found.  Procedures Procedures (including critical care time)  Medications Ordered in UC Medications - No data to display  Initial Impression / Assessment and Plan / UC Course  I have reviewed the triage vital signs and the nursing notes.  Pertinent labs & imaging results that were available during my care of the patient were reviewed by me and considered in my medical decision making (see chart for details).     Stomatitis -will start acyclovir for possible herpetic stomatitis.  Will also give triamcinolone dental paste to help shrink the oral ulcerations.  Magic mouthwash for supportive care.  I did encourage patient to follow-up with his dentist due to continued dental disease and gingivitis.  Patient otherwise shows no signs of a dental abscess at current moment, pt afebrile.  Final Clinical Impressions(s) / UC Diagnoses   Final diagnoses:  Stomatitis     Discharge Instructions      Your mouth pain is due to stomatitis, or ulcers in the mouth. Please read the attached handout. Please take the antiviral 5 times daily until gone. Please apply the topical triamcinolone dental paste to the affected ulcerations of your mouth for up to 1 week. Use the Magic mouthwash on an as-needed basis to help with oral pain. Please schedule follow-up with your dentist as soon as possible to ensure complete resolution.  You also have gingivitis which sometimes requires prescription mouthwash, but this should be given from your dental specialist.     ED Prescriptions     Medication Sig Dispense Auth. Provider   acyclovir (ZOVIRAX) 400 MG tablet Take 1 tablet (400 mg total) by mouth 5 (five) times daily for 5 days. 25 tablet Jaqualyn Juday L, PA   triamcinolone (KENALOG) 0.1 % paste Use as directed 1 Application in the mouth or throat 2 (two) times  daily. 5 g Shiva Karis L, [place]   magic mouthwash (lidocaine, diphenhydrAMINE, alum & mag hydroxide) suspension Swish and spit 5 mLs 4 (four) times daily as needed for mouth pain. 140 mL Schae Cando L, PA      PDMP not reviewed this encounter.   Chaney Malling, [place] 06/20/22 [phone]

## 2022-06-23 ENCOUNTER — Ambulatory Visit: Payer: Medicare HMO | Attending: Nurse Practitioner | Admitting: Nurse Practitioner

## 2022-06-23 ENCOUNTER — Ambulatory Visit: Payer: Self-pay | Admitting: *Deleted

## 2022-06-23 ENCOUNTER — Encounter: Payer: Self-pay | Admitting: Nurse Practitioner

## 2022-06-23 VITALS — BP 121/81 | HR 95 | Ht 73.0 in | Wt 192.8 lb

## 2022-06-23 DIAGNOSIS — R195 Other fecal abnormalities: Secondary | ICD-10-CM

## 2022-06-23 NOTE — Telephone Encounter (Signed)
Summary: Diarrhea Advice   Pt is calling to report that he has diarrhea on & off for 1 week. Pt went to the ED. But is still continuing to have diarrhea. Please advise         Reason for Disposition  [1] Mild diarrhea (e.g., 1-3 or more stools than normal in past 24 hours) without known cause AND [2] present >  7 days  Answer Assessment - Initial Assessment Questions 1. DIARRHEA SEVERITY: "How bad is the diarrhea?" "How many more stools have you had in the past 24 hours than normal?"    - NO DIARRHEA (SCALE 0)   - MILD (SCALE 1-3): Few loose or mushy BMs; increase of 1-3 stools over normal daily number of stools; mild increase in ostomy output.   -  MODERATE (SCALE 4-7): Increase of 4-6 stools daily over normal; moderate increase in ostomy output.   -  SEVERE (SCALE 8-10; OR "WORST POSSIBLE"): Increase of 7 or more stools daily over normal; moderate increase in ostomy output; incontinence.     Mild- 3, not abnormal 2. ONSET: "When did the diarrhea begin?"      1 week- C diff history 3. BM CONSISTENCY: "How loose or watery is the diarrhea?"      On off- solid/loose stool 4. VOMITING: "Are you also vomiting?" If Yes, ask: "How many times in the past 24 hours?"      no 5. ABDOMEN PAIN: "Are you having any abdomen pain?" If Yes, ask: "What does it feel like?" (e.g., crampy, dull, intermittent, constant)      no 6. ABDOMEN PAIN SEVERITY: If present, ask: "How bad is the pain?"  (e.g., Scale 1-10; mild, moderate, or severe)   - MILD (1-3): doesn't interfere with normal activities, abdomen soft and not tender to touch    - MODERATE (4-7): interferes with normal activities or awakens from sleep, abdomen tender to touch    - SEVERE (8-10): excruciating pain, doubled over, unable to do any normal activities       no 7. ORAL INTAKE: If vomiting, "Have you been able to drink liquids?" "How much liquids have you had in the past 24 hours?"     N/A 8. HYDRATION: "Any signs of dehydration?" (e.g., dry  mouth [not just dry lips], too weak to stand, dizziness, new weight loss) "When did you last urinate?"     No dehydration 9. EXPOSURE: "Have you traveled to a foreign country recently?" "Have you been exposed to anyone with diarrhea?" "Could you have eaten any food that was spoiled?"     no 10. ANTIBIOTIC USE: "Are you taking antibiotics now or have you taken antibiotics in the past 2 months?"       Yes- C-diff recently- seemed to go away with treatment 11. OTHER SYMPTOMS: "Do you have any other symptoms?" (e.g., fever, blood in stool)       Loose stool only  Protocols used: Diarrhea-A-AH

## 2022-06-23 NOTE — Progress Notes (Signed)
Assessment & Plan:  [name] was seen today for diarrhea.  Diagnoses and all orders for this visit:  Loose stools -     Clostridium difficile EIA    Patient has been counseled on age-appropriate routine health concerns for screening and prevention. These are reviewed and up-to-date. Referrals have been placed accordingly. Immunizations are up-to-date or declined.    Subjective:   Chief Complaint  Patient presents with   Diarrhea   Diarrhea  Pertinent negatives include no coughing, fever, headaches, myalgias, vomiting or weight loss.   [name] 72 y.o. male presents to office today with complaints of diarrhea. He has a recent history of CDIFF (05-02-2022) for which he completed treatment of Dificid for 10 days. He endorses diarrhea resolving at that time.  Today he states his stools have been alternating between formed and loose over the past several week. He denies any pungent odor but states they have become more loose since starting azithromycin.  He is currently being treated for stomatitis azithromycin 400 mg 5 times daily for 5 days as of 06-20-2022.  At this time I would like for him to complete his abx and return at the end of this week for CDiff testing. He denies melena, hematochezia, N/V  Review of Systems  Constitutional:  Negative for fever, malaise/fatigue and weight loss.  HENT: Negative.  Negative for nosebleeds.   Eyes: Negative.  Negative for blurred vision, double vision and photophobia.  Respiratory: Negative.  Negative for cough and shortness of breath.   Cardiovascular: Negative.  Negative for chest pain, palpitations and leg swelling.  Gastrointestinal:  Positive for diarrhea. Negative for heartburn, nausea and vomiting.  Musculoskeletal: Negative.  Negative for myalgias.  Neurological: Negative.  Negative for dizziness, focal weakness, seizures and headaches.  Psychiatric/Behavioral: Negative.  Negative for suicidal ideas.     Past Medical  History:  Diagnosis Date   Abscess of anal and rectal regions 07/15/2007   Qualifier: Diagnosis of  By: Amil Amen MD, Elizabeth     Atrial fibrillation [facility])    Diabetes mellitus without complication ([place]) 1696   type 2   FISTULA, ANAL 01/27/2007   Qualifier: Diagnosis of  By: Amil Amen MD, Elizabeth     GERD (gastroesophageal reflux disease)    Hypertension Dx 2008   Prostate cancer ([place])    Spontaneous pneumothorax    Stroke [facility])     Past Surgical History:  Procedure Laterality Date   CATARACT EXTRACTION  06/2017   CYSTOSCOPY  05/2017   INTERCOSTAL NERVE BLOCK Left 05/13/2020   Procedure: INTERCOSTAL NERVE BLOCK;  Surgeon: Lajuana Matte, MD;  [facility] OR;  Service: Thoracic;  Laterality: Left;   LUMBAR LAMINECTOMY/DECOMPRESSION MICRODISCECTOMY Right 03/11/2021   Procedure: Microdiscectomy - right - Lumbar four-Lumbar five extraforaminal;  Surgeon: Earnie Larsson, MD;  Location: [place];  Service: Neurosurgery;  Laterality: Right;   LUMBAR LAMINECTOMY/DECOMPRESSION MICRODISCECTOMY Left 12/29/2021   Procedure: Extraforaminal microdiscectomy - left - L4-L5;  Surgeon: Earnie Larsson, MD;  Location: [place];  Service: Neurosurgery;  Laterality: Left;  3C   PROSTATE BIOPSY     WEDGE RESECTION Left 05/13/2020   XI robotic assisted thorascopy    Family History  Problem Relation Age of Onset   Diabetes Mother    Breast cancer Mother    Diabetes Father    Breast cancer Sister    Prostate cancer Neg Hx    Colon cancer Neg Hx    Pancreatic cancer Neg Hx     Social History  Reviewed with no changes to be made today.   Outpatient Medications Prior to Visit  Medication Sig Dispense Refill   acyclovir (ZOVIRAX) 400 MG tablet Take 1 tablet (400 mg total) by mouth 5 (five) times daily for 5 days. 25 tablet 0   amLODipine (NORVASC) 10 MG tablet Take 1 tablet (10 mg total) by mouth daily. 90 tablet 1   apixaban (ELIQUIS) 5 MG TABS tablet TAKE 1 TABLET BY MOUTH TWICE DAILY. 60 tablet 3    B Complex Vitamins (VITAMIN B COMPLEX) TABS Take 1 tablet by mouth daily.     diclofenac Sodium (VOLTAREN) 1 % GEL Apply 2 g topically 3 (three) times daily as needed (pain). 100 g 0   dicyclomine (BENTYL) 20 MG tablet Take 1 tablet (20 mg total) by mouth 4 (four) times daily as needed (intestinal cramps). 40 tablet 0   fluticasone (FLONASE) 50 MCG/ACT nasal spray Place 1 spray into both nostrils daily as needed for allergies or rhinitis.     glimepiride (AMARYL) 1 MG tablet Take 1 tablet (1 mg total) by mouth daily with breakfast. 30 tablet 3   HYDROcodone-acetaminophen (NORCO) 10-325 MG tablet Take 1 tablet by mouth every 6 (six) hours as needed. 40 tablet 0   hydrOXYzine (VISTARIL) 25 MG capsule Take 1 capsule (25 mg total) by mouth 2 (two) times daily as needed. 60 capsule 1   latanoprost (XALATAN) 0.005 % ophthalmic solution Place 1 drop into both eyes nightly. 2.5 mL 3   latanoprost (XALATAN) 0.005 % ophthalmic solution Place 1 drop into both eyes nightly. 2.5 mL 3   methocarbamol (ROBAXIN) 750 MG tablet Take 1 tablet (750 mg total) by mouth 3 (three) times daily as needed (muscle spasm/pain). 15 tablet 0   Multiple Vitamin (MULTIVITAMIN WITH MINERALS) TABS tablet Take 1 tablet by mouth daily.     omeprazole (PRILOSEC) 20 MG capsule Take 1 capsule (20 mg total) by mouth daily. 30 capsule 3   pravastatin (PRAVACHOL) 20 MG tablet Take 1 tablet (20 mg total) by mouth daily. 90 tablet 1   sennosides-docusate sodium (SENOKOT-S) 8.6-50 MG tablet Take 1 tablet by mouth daily. 15 tablet 0   triamcinolone (KENALOG) 0.1 % paste Use as directed 1 Application in the mouth or throat 2 (two) times daily. 5 g 0   triamcinolone cream (KENALOG) 0.1 % Apply 1 Application topically 2 (two) times daily. 30 g 0   fidaxomicin (DIFICID) 200 MG TABS tablet Take 1 tablet (200 mg total) by mouth 2 (two) times daily. 20 tablet 0   gabapentin (NEURONTIN) 100 MG capsule TAKE 1 CAPSULE (100 MG TOTAL) BY MOUTH 3 (THREE)  TIMES DAILY. NEEDED FOR NUMBNESS (Patient not taking: Reported on 06/23/2022) 90 capsule 1   magic mouthwash (lidocaine, diphenhydrAMINE, alum & mag hydroxide) suspension Swish and spit 5 mLs 4 (four) times daily as needed for mouth pain. (Patient not taking: Reported on 06/23/2022) 140 mL 0   tamsulosin (FLOMAX) 0.4 MG CAPS capsule Take 1 capsule (0.4 mg total) by mouth daily after supper. (Patient not taking: Reported on 06/23/2022) 30 capsule 5   No facility-administered medications prior to visit.    Allergies  Allergen Reactions   Lisinopril Swelling    Throat swelling   Tramadol Hcl     "Makes me crazy"       Objective:    BP 121/81   Pulse 95   Ht '6\' 1"'$  (1.854 m)   Wt 192 lb 12.8 oz (87.5 kg)   SpO2  98%   BMI 25.44 kg/m  Wt Readings from Last 3 Encounters:  06/23/22 192 lb 12.8 oz (87.5 kg)  04/17/22 190 lb (86.2 kg)  03/31/22 190 lb 3.2 oz (86.3 kg)    Physical Exam Vitals and nursing note reviewed.  Constitutional:      Appearance: He is well-developed.  HENT:     Head: Normocephalic and atraumatic.  Cardiovascular:     Rate and Rhythm: Normal rate and regular rhythm.     Heart sounds: Normal heart sounds. No murmur heard.    No friction rub. No gallop.  Pulmonary:     Effort: Pulmonary effort is normal. No tachypnea or respiratory distress.     Breath sounds: Normal breath sounds. No decreased breath sounds, wheezing, rhonchi or rales.  Chest:     Chest wall: No tenderness.  Abdominal:     General: Bowel sounds are normal.     Palpations: Abdomen is soft.  Musculoskeletal:        General: Normal range of motion.     Cervical back: Normal range of motion.  Skin:    General: Skin is warm and dry.  Neurological:     Mental Status: He is alert and oriented to person, place, and time.     Coordination: Coordination normal.  Psychiatric:        Behavior: Behavior normal. Behavior is cooperative.        Thought Content: Thought content normal.        Judgment:  Judgment normal.          Patient has been counseled extensively about nutrition and exercise as well as the importance of adherence with medications and regular follow-up. The patient was given clear instructions to go to ER or return to medical center if symptoms don't improve, worsen or new problems develop. The patient verbalized understanding.   Follow-up: No follow-ups on file.   Gildardo Pounds, FNP-BC [facility] and [facility], [place]   06/23/2022, 11:30 AM

## 2022-06-23 NOTE — Telephone Encounter (Signed)
  Chief Complaint: loose stool for 1 week- recent treatment C-diff Symptoms: losse stools Frequency: 1 week Pertinent Negatives: Patient denies fever, blood in stool Disposition: '[]'$ ED /'[]'$ Urgent Care (no appt availability in office) / '[x]'$ Appointment(In office/virtual)/ '[]'$  [place] [facility]/ '[]'$ Home Care/ '[]'$ Refused Recommended Disposition /'[]'$  Mobile Bus/ '[]'$  Follow-up with PCP Additional Notes: Patient states he had recent treatment for C-diff-  he seemed to get better- but now is having loose stool again- no pain,fever

## 2022-06-25 ENCOUNTER — Telehealth: Payer: Self-pay | Admitting: Internal Medicine

## 2022-06-25 DIAGNOSIS — H5203 Hypermetropia, bilateral: Secondary | ICD-10-CM | POA: Diagnosis not present

## 2022-06-25 DIAGNOSIS — H52203 Unspecified astigmatism, bilateral: Secondary | ICD-10-CM | POA: Diagnosis not present

## 2022-06-25 DIAGNOSIS — H401131 Primary open-angle glaucoma, bilateral, mild stage: Secondary | ICD-10-CM | POA: Diagnosis not present

## 2022-06-25 DIAGNOSIS — Z7984 Long term (current) use of oral hypoglycemic drugs: Secondary | ICD-10-CM | POA: Diagnosis not present

## 2022-06-25 DIAGNOSIS — H524 Presbyopia: Secondary | ICD-10-CM | POA: Diagnosis not present

## 2022-06-25 DIAGNOSIS — E119 Type 2 diabetes mellitus without complications: Secondary | ICD-10-CM | POA: Diagnosis not present

## 2022-06-25 DIAGNOSIS — Z961 Presence of intraocular lens: Secondary | ICD-10-CM | POA: Diagnosis not present

## 2022-06-25 LAB — HM DIABETES EYE EXAM

## 2022-06-25 NOTE — Telephone Encounter (Signed)
Copied from [place] [id number]. Topic: General - Other >> Jun 25, 2022  1:30 PM Ja-Kwan M wrote: Reason for CRM: Pt reports that the issue corrected itself and he does not need to bring in a stool sample.

## 2022-07-02 ENCOUNTER — Other Ambulatory Visit: Payer: Self-pay

## 2022-07-03 ENCOUNTER — Other Ambulatory Visit: Payer: Self-pay

## 2022-07-06 ENCOUNTER — Other Ambulatory Visit: Payer: Self-pay

## 2022-07-10 ENCOUNTER — Encounter: Payer: Self-pay | Admitting: Internal Medicine

## 2022-07-10 ENCOUNTER — Ambulatory Visit: Payer: Medicare HMO | Attending: Internal Medicine | Admitting: Internal Medicine

## 2022-07-10 VITALS — BP 136/82 | HR 80 | Temp 97.9°F | Ht 73.0 in | Wt 195.0 lb

## 2022-07-10 DIAGNOSIS — F17200 Nicotine dependence, unspecified, uncomplicated: Secondary | ICD-10-CM

## 2022-07-10 DIAGNOSIS — Z8546 Personal history of malignant neoplasm of prostate: Secondary | ICD-10-CM | POA: Diagnosis not present

## 2022-07-10 DIAGNOSIS — L720 Epidermal cyst: Secondary | ICD-10-CM

## 2022-07-10 DIAGNOSIS — E1149 Type 2 diabetes mellitus with other diabetic neurological complication: Secondary | ICD-10-CM

## 2022-07-10 DIAGNOSIS — E1159 Type 2 diabetes mellitus with other circulatory complications: Secondary | ICD-10-CM | POA: Diagnosis not present

## 2022-07-10 DIAGNOSIS — E1169 Type 2 diabetes mellitus with other specified complication: Secondary | ICD-10-CM

## 2022-07-10 DIAGNOSIS — E785 Hyperlipidemia, unspecified: Secondary | ICD-10-CM

## 2022-07-10 DIAGNOSIS — I480 Paroxysmal atrial fibrillation: Secondary | ICD-10-CM | POA: Diagnosis not present

## 2022-07-10 DIAGNOSIS — I69354 Hemiplegia and hemiparesis following cerebral infarction affecting left non-dominant side: Secondary | ICD-10-CM | POA: Diagnosis not present

## 2022-07-10 DIAGNOSIS — I152 Hypertension secondary to endocrine disorders: Secondary | ICD-10-CM

## 2022-07-10 NOTE — Progress Notes (Addendum)
Patient ID: [name], male    DOB: [birth year]  MRN: [id number]  CC: Follow-up (Psa testing done in Dec at [facility], [place]. Pt reports result was 0.87. /)   Subjective: [name] is a 72 y.o. 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 cocaine abuse in remission since 2005, LT pneumothorax, POAG( BL glaucoma).     History of prostate CA: Saw his urologist recently.  PSA was 0.87.  He completed XRT.  He was given a Lupron shot in the past but did not like side effects and decided not to receive any further.  HTN: Blood pressure today was elevated.  Patient states he was in a bad mood.  Checks blood pressure daily at home.  Range has been 120s over low 80s.  Compliant with Norvasc 10 mg daily. Taking Eliquis for history of atrial fibrillation.  No bruising or bleeding on the Eliquis.  He ambulates with a cane.  He has not had any recent falls. Reports compliance with taking Pravachol daily.  Last LDL was not at goal.  I had recommended changing to a different statin like atorvastatin but patient declined on prefers to stay on Pravachol. Checks his blood sugars once a day in the mornings.  Range has been 110-120.  No low blood sugar episodes.  Reports compliance with taking Amaryl.  Doing well with his eating habits. He is smoked for 15 years.  He was a light smoker 3 to 4 cigarettes/week.  He has now picked up smoking a pipe 3 days a week.    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   Unintended weight loss 01/29/2020   Pain due to onychomycosis of toenails of both feet 08/01/2019   Porokeratosis 08/01/2019   Influenza vaccine needed 02/24/2019   Diabetes mellitus ([place]) 02/24/2019   Malignant neoplasm of prostate ([place]) 09/23/2018   Hyperlipemia 11/24/2016   History of CVA with residual deficit 11/24/2016   GERD (gastroesophageal reflux disease) 08/09/2014   Atrial fibrillation ([place])    Hemiparesis affecting left side as late effect of stroke ([place]) 05/01/2014   ERECTILE DYSFUNCTION, 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 90 tablet 1   apixaban (ELIQUIS) 5 MG TABS tablet TAKE 1 TABLET BY MOUTH TWICE DAILY. 60 tablet 3   B Complex Vitamins (VITAMIN B COMPLEX) TABS Take 1 tablet by mouth daily.     diclofenac Sodium (VOLTAREN) 1 % GEL Apply 2 g topically 3 (three) times daily as needed (pain). 100 g 0  dicyclomine (BENTYL) 20 MG tablet Take 1 tablet (20 mg total) by mouth 4 (four) times daily as needed (intestinal cramps). 40 tablet 0   fluticasone (FLONASE) 50 MCG/ACT nasal spray Place 1 spray into both nostrils daily as needed for allergies or rhinitis.     glimepiride (AMARYL) 1 MG tablet Take 1 tablet (1 mg total) by mouth daily with breakfast. 30 tablet 3   HYDROcodone-acetaminophen (NORCO) 10-325 MG tablet Take 1 tablet by mouth every 6 (six) hours as needed. 40 tablet 0   hydrOXYzine (VISTARIL) 25 MG capsule Take 1 capsule (25 mg total)  by mouth 2 (two) times daily as needed. 60 capsule 1   latanoprost (XALATAN) 0.005 % ophthalmic solution Place 1 drop into both eyes nightly. 2.5 mL 3   latanoprost (XALATAN) 0.005 % ophthalmic solution Place 1 drop into both eyes nightly. 2.5 mL 3   Multiple Vitamin (MULTIVITAMIN WITH MINERALS) TABS tablet Take 1 tablet by mouth daily.     omeprazole (PRILOSEC) 20 MG capsule Take 1 capsule (20 mg total) by mouth daily. 30 capsule 3   pravastatin (PRAVACHOL) 20 MG tablet Take 1 tablet (20 mg total) by mouth daily. 90 tablet 1   sennosides-docusate sodium (SENOKOT-S) 8.6-50 MG tablet Take 1 tablet by mouth daily. 15 tablet 0   triamcinolone (KENALOG) 0.1 % paste Use as directed 1 Application in the mouth or throat 2 (two) times daily. 5 g 0   triamcinolone cream (KENALOG) 0.1 % Apply 1 Application topically 2 (two) times daily. 30 g 0   gabapentin (NEURONTIN) 100 MG capsule TAKE 1 CAPSULE (100 MG TOTAL) BY MOUTH 3 (THREE) TIMES DAILY. NEEDED FOR NUMBNESS (Patient not taking: Reported on 06/23/2022) 90 capsule 1   magic mouthwash (lidocaine, diphenhydrAMINE, alum & mag hydroxide) suspension Swish and spit 5 mLs 4 (four) times daily as needed for mouth pain. (Patient not taking: Reported on 06/23/2022) 140 mL 0   methocarbamol (ROBAXIN) 750 MG tablet Take 1 tablet (750 mg total) by mouth 3 (three) times daily as needed (muscle spasm/pain). (Patient not taking: Reported on 07/10/2022) 15 tablet 0   tamsulosin (FLOMAX) 0.4 MG CAPS capsule Take 1 capsule (0.4 mg total) by mouth daily after supper. (Patient not taking: Reported on 06/23/2022) 30 capsule 5   No current facility-administered medications on file prior to visit.    Allergies  Allergen Reactions   Lisinopril Swelling    Throat swelling   Tramadol Hcl     "Makes me crazy"    Social History   Socioeconomic History   Marital status: Married    Spouse name: Not on file   Number of children: 5   Years of education: Not on file   Highest  education level: Not on file  Occupational History   Occupation: Caregiver    Comment: retired  Tobacco Use   Smoking status: Some Days    Packs/day: 0.25    Years: 10.00    Total pack years: 2.50    Types: Pipe, Cigarettes   Smokeless tobacco: Never   Tobacco comments:    Quit 05/15/14  Vaping Use   Vaping Use: Never used  Substance and Sexual Activity   Alcohol use: Yes    Alcohol/week: 1.0 - 2.0 standard drink of alcohol    Types: 1 - 2 Standard drinks or equivalent per week    Comment: occasional wine   Drug use: No   Sexual activity: Not Currently  Other Topics Concern   Not on file  Social History Narrative   Lives with his wife   5 children, grown.   7 grandchildren.    Social Determinants of Health   Financial Resource Strain: Low Risk  (04/17/2022)   Overall Financial Resource Strain (CARDIA)    Difficulty of Paying Living Expenses: Not hard at all  Food Insecurity: No Food Insecurity (04/17/2022)   Hunger Vital Sign    Worried About Running Out of Food in the Last Year: Never true    Ran Out of Food in the Last Year: Never true  Transportation Needs: No Transportation Needs (04/17/2022)   PRAPARE - Hydrologist (Medical): No    Lack of Transportation (Non-Medical): No  Physical Activity: Insufficiently Active (04/17/2022)   Exercise Vital Sign    Days of Exercise per Week: 3 days    Minutes of Exercise per Session: 30 min  Stress: No Stress Concern Present (04/17/2022)   [place]    Feeling of Stress : Not at all  Social Connections: Moderately Integrated (04/17/2022)   Social Connection and Isolation Panel [NHANES]    Frequency of Communication with Friends and Family: More than three times a week    Frequency of Social Gatherings with Friends and Family: More than three times a week    Attends Religious Services: More than 4 times per year    Active Member  of Clubs or Organizations: Yes    Attends Archivist Meetings: More than 4 times per year    Marital Status: Separated  Intimate Partner Violence: Not At Risk (04/17/2022)   Humiliation, Afraid, Rape, and Kick questionnaire    Fear of Current or Ex-Partner: No    Emotionally Abused: No    Physically Abused: No    Sexually Abused: No    Family History  Problem Relation Age of Onset   Diabetes Mother    Breast cancer Mother    Diabetes Father    Breast cancer Sister    Prostate cancer Neg Hx    Colon cancer Neg Hx    Pancreatic cancer Neg Hx     Past Surgical History:  Procedure Laterality Date   CATARACT EXTRACTION  06/2017   CYSTOSCOPY  05/2017   INTERCOSTAL NERVE BLOCK Left 05/13/2020   Procedure: INTERCOSTAL NERVE BLOCK;  Surgeon: Lajuana Matte, MD;  Location: [place];  Service: Thoracic;  Laterality: Left;   LUMBAR LAMINECTOMY/DECOMPRESSION MICRODISCECTOMY Right 03/11/2021   Procedure: Microdiscectomy - right - Lumbar four-Lumbar five extraforaminal;  Surgeon: Earnie Larsson, MD;  Location: [place];  Service: Neurosurgery;  Laterality: Right;   LUMBAR LAMINECTOMY/DECOMPRESSION MICRODISCECTOMY Left 12/29/2021   Procedure: Extraforaminal microdiscectomy - left - L4-L5;  Surgeon: Earnie Larsson, MD;  Location: [place];  Service: Neurosurgery;  Laterality: Left;  3C   PROSTATE BIOPSY     WEDGE RESECTION Left 05/13/2020   XI robotic assisted thorascopy    ROS: Review of Systems Negative except as stated above  PHYSICAL EXAM: BP (!) 149/92 (BP Location: Left Arm, Patient Position: Sitting, Cuff Size: Normal)   Pulse 80   Temp 97.9 F (36.6 C) (Oral)   Ht '6\' 1"'$  (1.854 m)   Wt 195 lb (88.5 kg)   SpO2 97%   BMI 25.73 kg/m   Physical Exam   General appearance - alert, well appearing, and in no distress Mental status - normal mood, behavior, speech, dress, motor activity, and thought processes Neck - supple, no significant adenopathy Chest -  clear to auscultation,  no wheezes, rales or rhonchi, symmetric air entry Heart -heart rate is irregularly irregular but rate controlled. Neurological -gait is slow but steady.  He ambulates with a cane.  Grip on the left side is 4/5 compared to the right 5/5. Extremities - peripheral pulses normal, no pedal edema, no clubbing or cyanosis     Latest Ref Rng & Units 05/01/2022    4:11 PM 03/31/2022    4:20 PM 12/29/2021   11:08 AM  CMP  Glucose 70 - 99 mg/dL 144  178  133   BUN 8 - 23 mg/dL '10  16  13   '$ Creatinine 0.61 - 1.24 mg/dL 1.25  1.46  1.12   Sodium 135 - 145 mmol/L 137  138  136   Potassium 3.5 - 5.1 mmol/L 3.4  4.9  3.8   Chloride 98 - 111 mmol/L 103  102  105   CO2 22 - 32 mmol/L '22  21  21   '$ Calcium 8.9 - 10.3 mg/dL 9.1  9.6  8.9   Total Protein 6.5 - 8.1 g/dL 6.8  7.4    Total Bilirubin 0.3 - 1.2 mg/dL 0.1  0.3    Alkaline Phos 38 - 126 U/L 63  88    AST 15 - 41 U/L 15  14    ALT 0 - 44 U/L 9  14     Lipid Panel     Component Value Date/Time   CHOL 223 (H) 03/31/2022 1620   TRIG 142 03/31/2022 1620   HDL 60 03/31/2022 1620   CHOLHDL 3.7 03/31/2022 1620   CHOLHDL 4.2 05/02/2014 0000   VLDL 72 (H) 05/02/2014 0000   LDLCALC 138 (H) 03/31/2022 1620    CBC    Component Value Date/Time   WBC 5.6 05/01/2022 1611   RBC 4.30 05/01/2022 1611   HGB 13.0 05/01/2022 1611   HGB 13.8 01/29/2020 1022   HCT 37.2 (L) 05/01/2022 1611   HCT 40.6 01/29/2020 1022   PLT 341 05/01/2022 1611   PLT 268 01/29/2020 1022   MCV 86.5 05/01/2022 1611   MCV 89 01/29/2020 1022   MCH 30.2 05/01/2022 1611   MCHC 34.9 05/01/2022 1611   RDW 14.2 05/01/2022 1611   RDW 13.8 01/29/2020 1022   LYMPHSABS 0.7 11/17/2021 1526   MONOABS 0.2 11/17/2021 1526   EOSABS 0.0 11/17/2021 1526   BASOSABS 0.0 11/17/2021 1526   Lab Results  Component Value Date   HGBA1C 7.0 03/31/2022    ASSESSMENT AND PLAN: 1. Type 2 diabetes mellitus with other neurologic complication, without long-term current use of insulin  ([place]) Reported blood sugar readings are at goal.  Continue current dose of Amaryl. Encouraged him to continue healthy eating habits.  2. Hypertension associated with type 2 diabetes mellitus ([place]) Not at goal but improved on recheck.  Patient reports good blood pressure readings at home.  He will continue current dose of Norvasc.  3. PAF (paroxysmal atrial fibrillation) (HCC) Continue Eliquis.  Last H/H 13/37.2  4. Hyperlipidemia associated with type 2 diabetes mellitus (HCC) Continue Pravachol  5. History of prostate cancer Under the care of urology  6. Hemiparesis affecting left side as late effect of stroke (HCC) Continue pravastatin and Eliquis  7. Tobacco dependence Strongly advised to quit.  Discussed ongoing health risks associated with smoking.  8. Epidermoid cyst Addendum:  pt had reported a sore knot upper back that had been present for several months. No initiating factors. On exam, he  had what appeared to be a small inflamed cyst LT upper back above medial border of scapular. Plan is to refer to dermatology  Patient was given the opportunity to ask questions.  Patient verbalized understanding of the plan and was able to repeat key elements of the plan.   This documentation was completed using Radio producer.  Any transcriptional errors are unintentional.  No orders of the defined types were placed in this encounter.    Requested Prescriptions    No prescriptions requested or ordered in this encounter    No follow-ups on file.  Karle Plumber, MD, FACP

## 2022-07-13 ENCOUNTER — Other Ambulatory Visit: Payer: Self-pay | Admitting: Internal Medicine

## 2022-07-14 ENCOUNTER — Telehealth: Payer: Self-pay

## 2022-07-14 ENCOUNTER — Other Ambulatory Visit: Payer: Self-pay

## 2022-07-14 MED ORDER — DICLOFENAC SODIUM 1 % EX GEL
2.0000 g | Freq: Three times a day (TID) | CUTANEOUS | 0 refills | Status: DC | PRN
  Filled 2022-07-14 – 2022-08-28 (×2): qty 100, 17d supply, fill #0

## 2022-07-14 NOTE — Telephone Encounter (Signed)
Patient is needing referral placed.     Copied from [place] [phone]. Topic: Referral - Status >> Jul 14, 2022  3:37 PM Tiffany B wrote: Patient states PCP stated she was going to refer him to a dermatology at last office visit on 1/19/204, patient checking on the status and would like a follow up call.

## 2022-07-14 NOTE — Addendum Note (Signed)
Addended by: Karle Plumber B on: 07/14/2022 09:28 PM   Modules accepted: Orders

## 2022-07-14 NOTE — Telephone Encounter (Signed)
Requested Prescriptions  Pending Prescriptions Disp Refills   diclofenac Sodium (VOLTAREN) 1 % GEL 100 g 0    Sig: Apply 2 g topically 3 (three) times daily as needed (pain).     Analgesics:  Topicals Failed - 07/13/2022  6:21 PM      Failed - Manual Review: Labs are only required if the patient has taken medication for more than 8 weeks.      Failed - HCT in normal range and within 360 days    HCT  Date Value Ref Range Status  05/01/2022 37.2 (L) 39.0 - 52.0 % Final   Hematocrit  Date Value Ref Range Status  01/29/2020 40.6 37.5 - 51.0 % Final         Failed - Cr in normal range and within 360 days    Creatinine, Ser  Date Value Ref Range Status  05/01/2022 1.25 (H) 0.61 - 1.24 mg/dL Final   Creatinine, POC  Date Value Ref Range Status  12/04/2016 50 mg/dL Final   Creatinine, Urine  Date Value Ref Range Status  10/16/2014 565.1 mg/dL Final    Comment:    Result repeated and verified. Result confirmed by automatic dilution. No reference range established.          Passed - PLT in normal range and within 360 days    Platelets  Date Value Ref Range Status  05/01/2022 341 150 - 400 K/uL Final  01/29/2020 268 150 - 450 x10E3/uL Final         Passed - HGB in normal range and within 360 days    Hemoglobin  Date Value Ref Range Status  05/01/2022 13.0 13.0 - 17.0 g/dL Final  01/29/2020 13.8 13.0 - 17.7 g/dL Final         Passed - eGFR is 30 or above and within 360 days    GFR calc Af Amer  Date Value Ref Range Status  05/27/2020 74 >59 mL/min/1.73 Final    Comment:    **In accordance with recommendations from the NKF-ASN Task force,**   [facility] is in the process of updating its eGFR calculation to the   2021 CKD-EPI creatinine equation that estimates kidney function   without a race variable.    GFR, Estimated  Date Value Ref Range Status  05/01/2022 >60 >60 mL/min Final    Comment:    (NOTE) Calculated using the CKD-EPI Creatinine Equation (2021)     eGFR  Date Value Ref Range Status  03/31/2022 51 (L) >59 mL/min/1.73 Final         Passed - Patient is not pregnant      Passed - Valid encounter within last 12 months    Recent Outpatient Visits           4 days ago Type 2 diabetes mellitus with other neurologic complication, without long-term current use of insulin ([place])   [place] [name], MD   3 weeks ago Loose stools   [place], Vernia Buff, NP   2 months ago C. difficile diarrhea   [place] Karle Plumber B, MD   3 months ago Type 2 diabetes mellitus with other neurologic complication, without long-term current use of insulin [facility])   [place] [name], MD   6 months ago Preoperative evaluation to rule out surgical contraindication   [place]  [place] [name], MD       Future Appointments             In 3 months [name] Dalbert Batman, MD [place]

## 2022-07-15 NOTE — Telephone Encounter (Signed)
Called & spoke to the patient. Confirmed name & DOB. Informed the patient that a referral has been placed to dermatology and will be contacted within the next 2-3 weeks & to call back if he has not been contacted. Patient expressed verbal understanding.

## 2022-07-16 ENCOUNTER — Other Ambulatory Visit: Payer: Self-pay | Admitting: Internal Medicine

## 2022-07-16 ENCOUNTER — Other Ambulatory Visit: Payer: Self-pay

## 2022-07-16 DIAGNOSIS — F419 Anxiety disorder, unspecified: Secondary | ICD-10-CM

## 2022-07-17 ENCOUNTER — Other Ambulatory Visit: Payer: Self-pay

## 2022-07-17 MED ORDER — HYDROXYZINE PAMOATE 25 MG PO CAPS
25.0000 mg | ORAL_CAPSULE | Freq: Two times a day (BID) | ORAL | 1 refills | Status: DC | PRN
  Filled 2022-07-17 – 2022-11-30 (×2): qty 60, 30d supply, fill #0
  Filled 2023-04-07 (×2): qty 60, 30d supply, fill #1

## 2022-07-17 NOTE — Telephone Encounter (Signed)
Requested Prescriptions  Pending Prescriptions Disp Refills   hydrOXYzine (VISTARIL) 25 MG capsule 60 capsule 1    Sig: Take 1 capsule (25 mg total) by mouth 2 (two) times daily as needed.     Ear, Nose, and Throat:  Antihistamines 2 Failed - 07/16/2022  2:59 PM      Failed - Cr in normal range and within 360 days    Creatinine, Ser  Date Value Ref Range Status  05/01/2022 1.25 (H) 0.61 - 1.24 mg/dL Final   Creatinine, POC  Date Value Ref Range Status  12/04/2016 50 mg/dL Final   Creatinine, Urine  Date Value Ref Range Status  10/16/2014 565.1 mg/dL Final    Comment:    Result repeated and verified. Result confirmed by automatic dilution. No reference range established.          Passed - Valid encounter within last 12 months    Recent Outpatient Visits           1 week ago Type 2 diabetes mellitus with other neurologic complication, without long-term current use of insulin ([place])   [place] [name], MD   3 weeks ago Loose stools   [place], Vernia Buff, NP   2 months ago C. difficile diarrhea   [place] Karle Plumber B, MD   3 months ago Type 2 diabetes mellitus with other neurologic complication, without long-term current use of insulin [facility])   [place] [name], MD   6 months ago Preoperative evaluation to rule out surgical contraindication   [place], MD       Future Appointments             In 3 months [name] Dalbert Batman, MD [place]

## 2022-07-20 DIAGNOSIS — H52203 Unspecified astigmatism, bilateral: Secondary | ICD-10-CM | POA: Diagnosis not present

## 2022-08-04 ENCOUNTER — Ambulatory Visit: Payer: Self-pay

## 2022-08-04 DIAGNOSIS — M5416 Radiculopathy, lumbar region: Secondary | ICD-10-CM

## 2022-08-04 NOTE — Addendum Note (Signed)
Addended by: Karle Plumber B on: 08/04/2022 01:49 PM   Modules accepted: Orders

## 2022-08-04 NOTE — Telephone Encounter (Signed)
Chief Complaint: Low back pain, ongoing . Asking for referral to Dr. Deri Fuelling.                                                                                                                                                                                                                                                                                                                                                                                                                                                               Symptoms: Pain, radiates down left leg Frequency: 1 2 years Pertinent Negatives: Patient denies  Disposition: []$ ED /[]$ Urgent Care (no appt availability in office) / []$ Appointment(In office/virtual)/ []$  [place] [facility]/ []$ Home Care/ []$ Refused Recommended Disposition /[]$ [place] [facility]/ []$  Follow-up with PCP Additional Notes: Please advise pt.  Answer Assessment - Initial Assessment Questions 1. ONSET: "When did the pain begin?"      2 years ago 2. LOCATION: "Where does it hurt?" (upper, mid or lower back)     Low back 3. SEVERITY: "How bad is the pain?"  (e.g., Scale 1-10; mild, moderate, or severe)   - MILD (1-3): Doesn't interfere with normal activities.    - MODERATE (4-7): Interferes with normal activities or awakens from sleep.    - SEVERE (8-10): Excruciating pain, unable to  do any normal activities.      Moderate 4. PATTERN: "Is the pain constant?" (e.g., yes, no; constant, intermittent)      Constant 5. RADIATION: "Does the pain shoot into your legs or somewhere else?"     Left leg 6. CAUSE:  "What do you think is causing the back pain?"      Unsure 7. BACK OVERUSE:  "Any recent lifting of heavy objects, strenuous work or exercise?"     No 8. MEDICINES: "What have you taken so far for the pain?" (e.g., nothing, acetaminophen, NSAIDS)     Tylenol 9. NEUROLOGIC SYMPTOMS: "Do you have any weakness, numbness, or problems with bowel/bladder  control?"     Weakness 10. OTHER SYMPTOMS: "Do you have any other symptoms?" (e.g., fever, abdomen pain, burning with urination, blood in urine)       No 11. PREGNANCY: "Is there any chance you are pregnant?" "When was your last menstrual period?"       N/A  Protocols used: Back Pain-A-AH

## 2022-08-04 NOTE — Telephone Encounter (Signed)
Noted  

## 2022-08-06 DIAGNOSIS — Z6825 Body mass index (BMI) 25.0-25.9, adult: Secondary | ICD-10-CM | POA: Diagnosis not present

## 2022-08-06 DIAGNOSIS — M5416 Radiculopathy, lumbar region: Secondary | ICD-10-CM | POA: Diagnosis not present

## 2022-08-13 ENCOUNTER — Ambulatory Visit: Payer: Medicare HMO | Admitting: Podiatry

## 2022-08-19 DIAGNOSIS — M5416 Radiculopathy, lumbar region: Secondary | ICD-10-CM | POA: Diagnosis not present

## 2022-08-19 DIAGNOSIS — M48061 Spinal stenosis, lumbar region without neurogenic claudication: Secondary | ICD-10-CM | POA: Diagnosis not present

## 2022-08-19 DIAGNOSIS — M5126 Other intervertebral disc displacement, lumbar region: Secondary | ICD-10-CM | POA: Diagnosis not present

## 2022-08-20 ENCOUNTER — Other Ambulatory Visit: Payer: Self-pay

## 2022-08-24 ENCOUNTER — Other Ambulatory Visit: Payer: Self-pay

## 2022-08-25 ENCOUNTER — Other Ambulatory Visit: Payer: Self-pay | Admitting: Internal Medicine

## 2022-08-25 DIAGNOSIS — L309 Dermatitis, unspecified: Secondary | ICD-10-CM

## 2022-08-26 NOTE — Telephone Encounter (Signed)
Requested medication (s) are due for refill today: yes  Requested medication (s) are on the active medication list: yes  Last refill:  05/05/22  Future visit scheduled: yes  Notes to clinic:  Unable to refill per protocol, cannot delegate.      Requested Prescriptions  Pending Prescriptions Disp Refills   triamcinolone cream (KENALOG) 0.1 % [Pharmacy Med Name: TRIAMCINOLONE 0.1% CREAM] 30 g 0    Sig: APPLY TO AFFECTED AREA TWICE A DAY     Not Delegated - Dermatology:  Corticosteroids Failed - 08/25/2022  7:13 PM      Failed - This refill cannot be delegated      Passed - Valid encounter within last 12 months    Recent Outpatient Visits           1 month ago Type 2 diabetes mellitus with other neurologic complication, without long-term current use of insulin ([place])   [place] [name], MD   2 months ago Loose stools   [place], Vernia Buff, NP   3 months ago C. difficile diarrhea   [place] Karle Plumber B, MD   4 months ago Type 2 diabetes mellitus with other neurologic complication, without long-term current use of insulin [facility])   [place] [name], MD   8 months ago Preoperative evaluation to rule out surgical contraindication   [place], MD       Future Appointments             In 2 months [name], MD [place]

## 2022-08-27 ENCOUNTER — Other Ambulatory Visit: Payer: Self-pay

## 2022-08-27 DIAGNOSIS — M5416 Radiculopathy, lumbar region: Secondary | ICD-10-CM | POA: Diagnosis not present

## 2022-08-28 ENCOUNTER — Other Ambulatory Visit: Payer: Self-pay

## 2022-09-01 ENCOUNTER — Other Ambulatory Visit: Payer: Self-pay

## 2022-09-04 ENCOUNTER — Telehealth: Payer: Self-pay | Admitting: Emergency Medicine

## 2022-09-04 NOTE — Telephone Encounter (Signed)
Copied from [place] [id number]. Topic: Referral - Request for Referral >> Sep 04, 2022  2:46 PM Penni Bombard wrote: Pt called saying he went to [facility] dermatology and he said they ask him the same questions 3 times.  He said he is not dealing with that.  CB#  [phone]

## 2022-09-07 NOTE — Telephone Encounter (Signed)
Called & spoke to the patient. Verified named & DOB. Inquired if patient would like a referral to a different dermatology. Patient declined and will call back if anything changed.  Juluis Rainier Dr.Johnson)

## 2022-09-08 ENCOUNTER — Ambulatory Visit: Payer: Medicare HMO | Admitting: Podiatry

## 2022-09-09 ENCOUNTER — Encounter: Payer: Self-pay | Admitting: Podiatry

## 2022-09-09 ENCOUNTER — Ambulatory Visit (INDEPENDENT_AMBULATORY_CARE_PROVIDER_SITE_OTHER): Payer: Medicare HMO | Admitting: Podiatry

## 2022-09-09 DIAGNOSIS — M79674 Pain in right toe(s): Secondary | ICD-10-CM | POA: Diagnosis not present

## 2022-09-09 DIAGNOSIS — M79675 Pain in left toe(s): Secondary | ICD-10-CM

## 2022-09-09 DIAGNOSIS — E1149 Type 2 diabetes mellitus with other diabetic neurological complication: Secondary | ICD-10-CM

## 2022-09-09 DIAGNOSIS — B351 Tinea unguium: Secondary | ICD-10-CM

## 2022-09-09 NOTE — Progress Notes (Signed)
This patient returns to my office for at risk foot care.  This patient requires this care by a professional since this patient will be at risk due to having diabetes.  This patient is unable to cut nails himself since the patient cannot reach his nails.These nails are painful walking and wearing shoes.  This patient presents for at risk foot care today.  General Appearance  Alert, conversant and in no acute stress.  Vascular  Dorsalis pedis and posterior tibial  pulses are palpable  bilaterally.  Capillary return is within normal limits  bilaterally. Temperature is within normal limits  bilaterally.  Neurologic  Senn-Weinstein monofilament wire test diminished bilaterally. Muscle power within normal limits bilaterally.  Nails Thick disfigured discolored nails with subungual debris  from hallux to fifth toes bilaterally. No evidence of bacterial infection or drainage bilaterally.  Orthopedic  No limitations of motion  feet .  No crepitus or effusions noted.  No bony pathology or digital deformities noted.  Plantar flexed fifth metatarsal  B/L.  Skin  normotropic skin with no porokeratosis noted bilaterally.  No signs of infections or ulcers noted.   Asymptomatic callus sub 5th  B/l.  Onychomycosis  Pain in right toes  Pain in left toes  Pes planus    Consent was obtained for treatment procedures.   Mechanical debridement of nails 1-5  bilaterally performed with a nail nipper.  Filed with dremel without incident. 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   Return office visit   3 months                  Told patient to return for periodic foot care and evaluation due to potential at risk complications.   Gardiner Barefoot DPM

## 2022-09-20 IMAGING — MR MR LUMBAR SPINE WO/W CM
4 of 7 series · 28 of 48 positions shown · IV contrast (9 GADAVIST)
Comparison: Lumbar spine MRI 01/31/2021

CLINICAL DATA: Lumbar radiculopathy.  History of prostate cancer.

EXAM:
MRI LUMBAR SPINE WITHOUT AND WITH CONTRAST
TECHNIQUE: Multiplanar and multiecho pulse sequences of the lumbar spine were
obtained without and with intravenous contrast.
CONTRAST:  9mL GADAVIST GADOBUTROL 1 MMOL/ML IV SOLN

[Series 5: T1 · sagittal · 4.0mm · 0.81mm/px · 3 of 14 slices shown (1 of 2)]
[im 1/14]
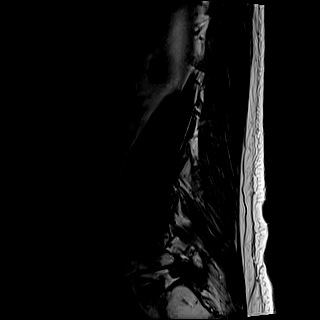
[im 7/14]
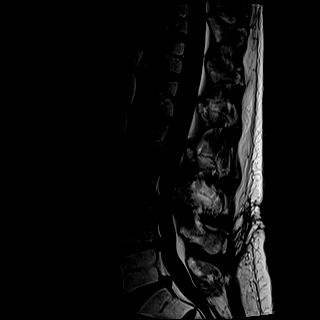
[im 14/14]
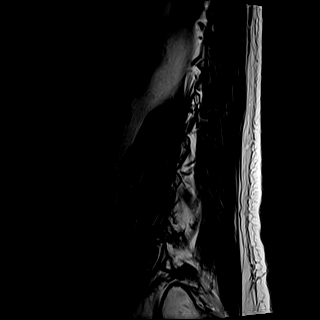

[Series 7: T2 · axial · 4.0mm · 0.62mm/px · z∈[-73,+161]mm · 11 of 40 slices shown]
[im 1/40]
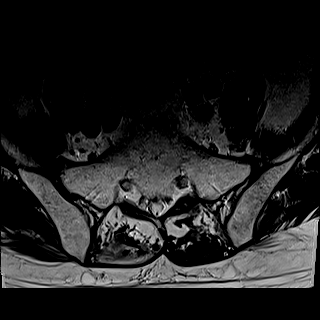
[im 4/40]
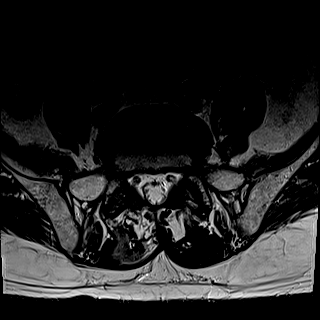
[im 8/40]
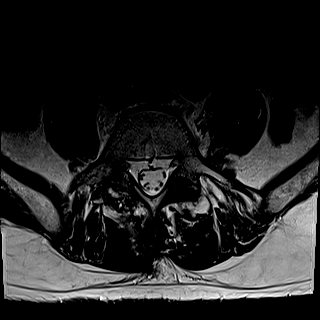
[im 12/40]
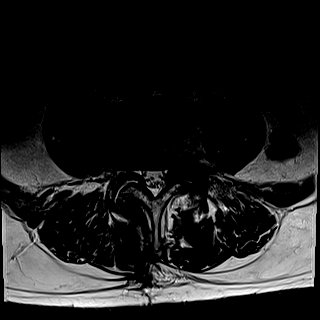
[im 16/40]
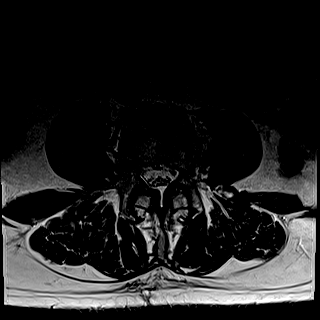
[im 20/40]
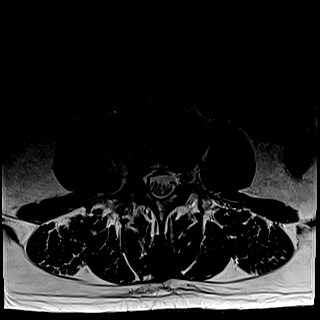
[im 24/40]
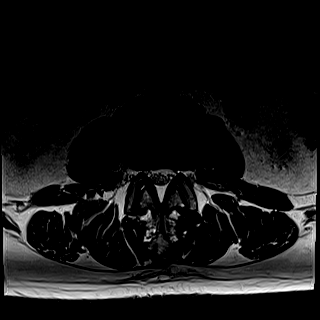
[im 28/40]
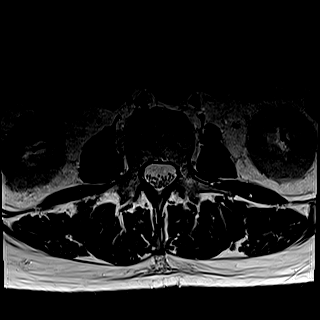
[im 32/40]
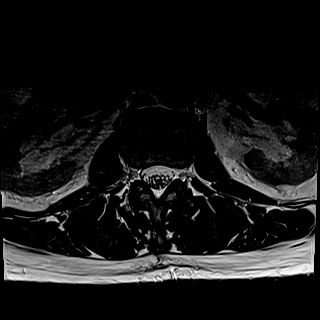
[im 36/40]
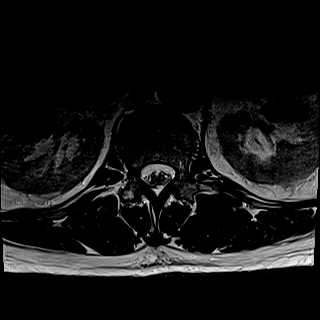
[im 40/40]
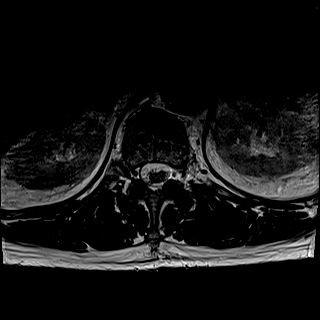

[Series 8: T1 · axial · 4.0mm · 0.39mm/px · z∈[-73,+142]mm · 10 of 40 slices shown (2 of 2)]
[im 1/40]
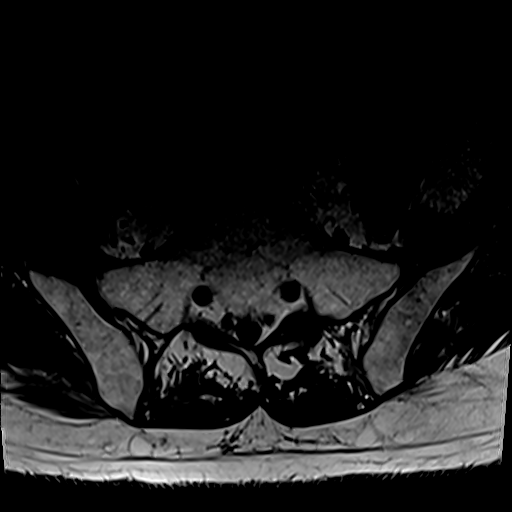
[im 4/40]
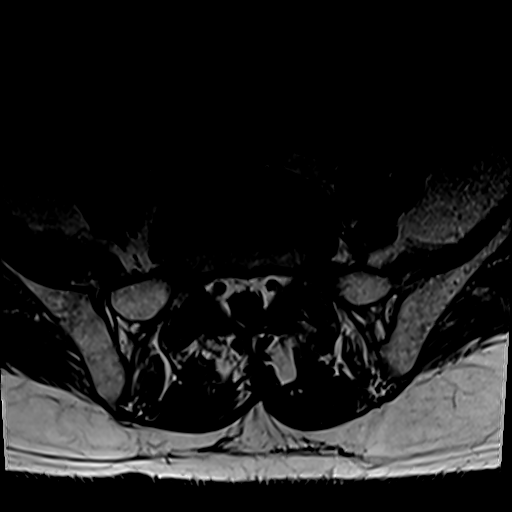
[im 8/40]
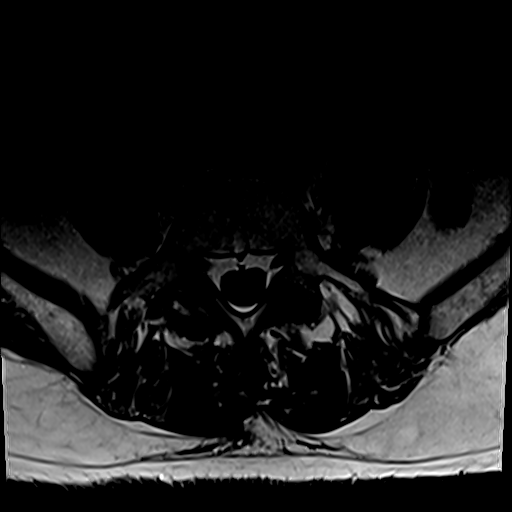
[im 12/40]
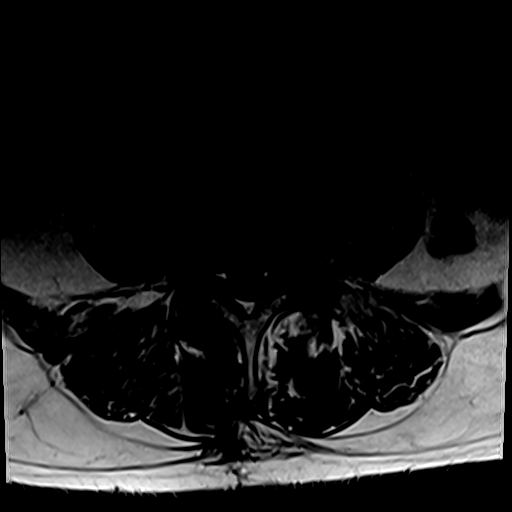
[im 16/40]
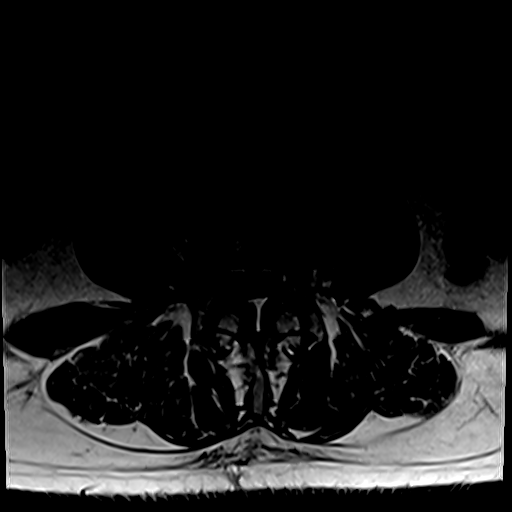
[im 20/40]
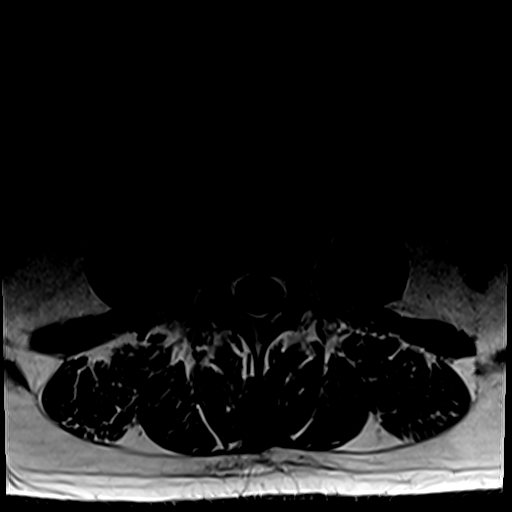
[im 24/40]
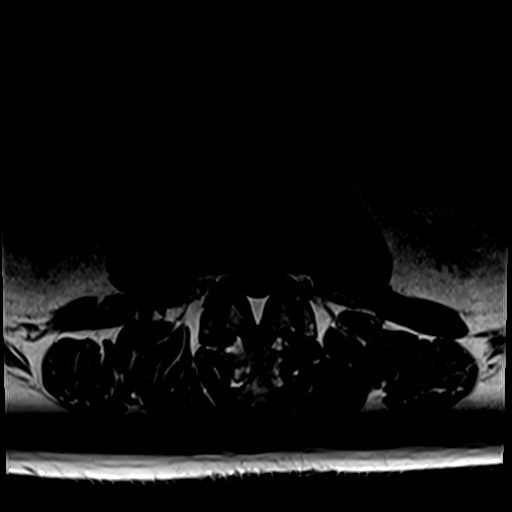
[im 28/40]
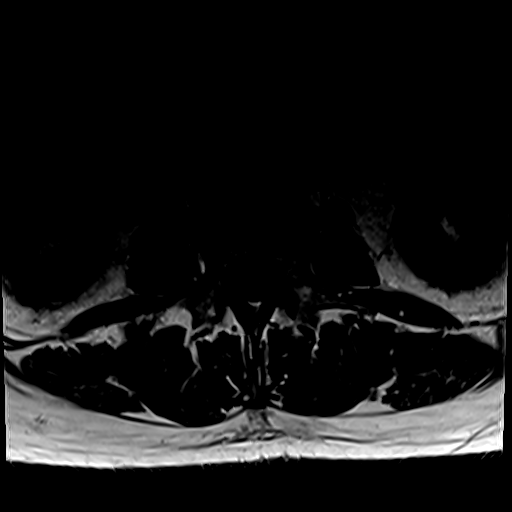
[im 32/40]
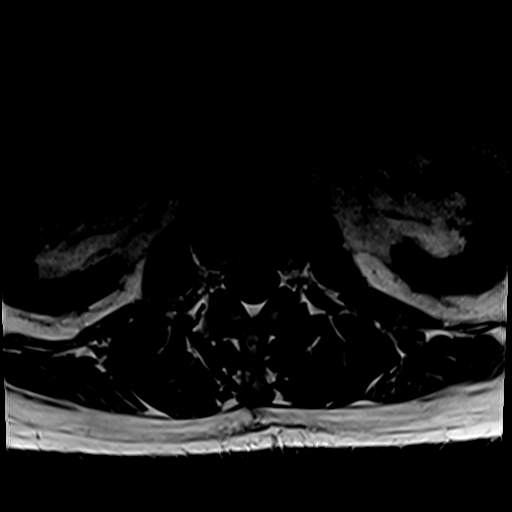
[im 36/40]
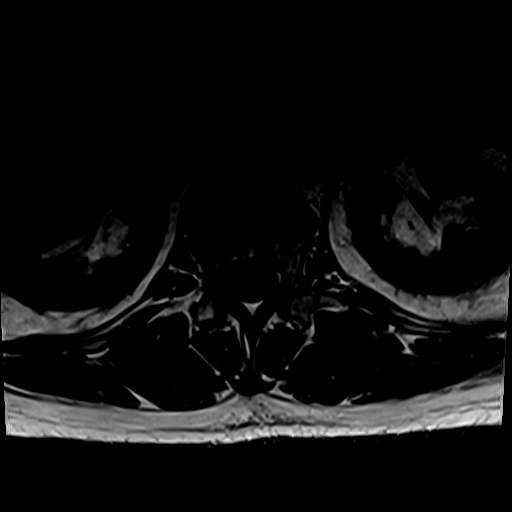

[Series 9: T2 post-contrast · sagittal · 4.0mm · 0.81mm/px · 4 of 14 slices shown]
[im 1/14]
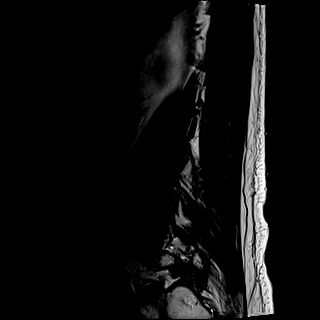
[im 5/14]
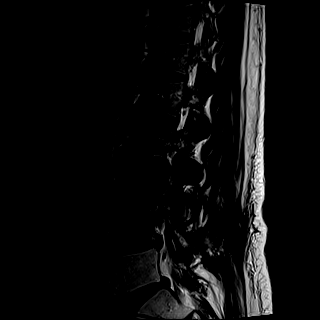
[im 9/14]
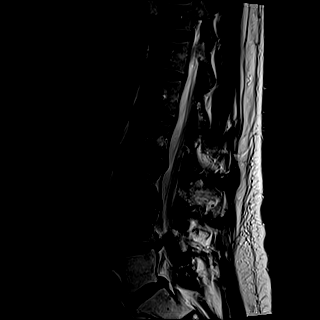
[im 14/14]
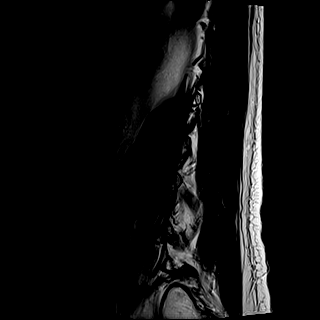

[28 of 48 positions shown; findings below may reference images not displayed]

FINDINGS: Segmentation:  Standard.

Alignment:  Unchanged.  No significant listhesis.

Vertebrae: Prominent fatty marrow changes throughout the L5 vertebra
and included portion of the pelvis likely related to radiation
therapy. No fracture or evidence of discitis. Mild persistent
degenerative endplate edema at L3-4 and L4-5.

Conus medullaris and cauda equina: Conus extends to the L1-2 level.
Conus and cauda equina appear normal.

Paraspinal and other soft tissues: Unremarkable.

Disc levels:

Disc desiccation throughout the lumbar spine with exception of L1-2.
Moderate disc space narrowing from L2-3 to L4-5.

T12-L1 and L1-2: Negative.

L2-3: Disc bulging and mild facet and ligamentum flavum hypertrophy
result in mild bilateral lateral recess stenosis without spinal or
neural foraminal stenosis, unchanged.

L3-4: Disc bulging, a right subarticular to extraforaminal disc
protrusion, and moderate facet and ligamentum flavum hypertrophy
result in mild spinal stenosis, mild right greater than left lateral
recess stenosis, and moderate to severe right neural foraminal
stenosis with potential right L3 nerve root impingement, not
significantly changed.

L4-5: Interval partial facetectomy on the right for extraforaminal
microdiskectomy with enhancing postoperative granulation tissue in
the right neural foramen and improved right neural foraminal
patency. Circumferential disc bulging, a left foraminal disc
protrusion/extrusion, and moderate to severe facet hypertrophy
result in mild bilateral lateral recess stenosis and mild right and
severe left neural foraminal stenosis with potential left L4 nerve
root impingement. No spinal stenosis.

L5-S1: Disc bulging, a left foraminal disc protrusion, and severe
facet hypertrophy result in severe left neural foraminal stenosis
with potential left L5 nerve root impingement, unchanged. No spinal
stenosis.
IMPRESSION: 1. Interval right L4-5 extraforaminal microdiscectomy with improved
right neural foraminal patency.
2. Unchanged severe left neural foraminal stenosis at L4-5 and
L5-S1.
3. Unchanged moderate to severe right neural foraminal stenosis at
L3-4.

## 2022-09-21 ENCOUNTER — Ambulatory Visit (INDEPENDENT_AMBULATORY_CARE_PROVIDER_SITE_OTHER): Payer: Medicare HMO

## 2022-09-21 ENCOUNTER — Telehealth: Payer: Self-pay | Admitting: Internal Medicine

## 2022-09-21 DIAGNOSIS — E1149 Type 2 diabetes mellitus with other diabetic neurological complication: Secondary | ICD-10-CM

## 2022-09-21 DIAGNOSIS — M216X2 Other acquired deformities of left foot: Secondary | ICD-10-CM

## 2022-09-21 DIAGNOSIS — M216X1 Other acquired deformities of right foot: Secondary | ICD-10-CM

## 2022-09-21 NOTE — Progress Notes (Signed)
Patient presents to the office today for diabetic shoe and insole measuring.  Patient was measured with brannock device to determine size and width for 1 pair of extra depth shoes and foam casted for 3 pair of insoles.   ABN signed.   Documentation of medical necessity will be sent to patient's treating diabetic doctor to verify and sign.   Patient's diabetic provider:  [name], MD   Shoes and insoles will be ordered at that time and patient will be notified for an appointment for fitting when they arrive.    Patient shoe selection-   1st   Shoe choice:   [id number]  Shoe size ordered: 12 W

## 2022-09-21 NOTE — Telephone Encounter (Signed)
FYI

## 2022-09-21 NOTE — Telephone Encounter (Signed)
Pt is calling to request an order to be placed Kidney function, HBC. Please advise when the order has been placed CB- [phone]

## 2022-09-23 NOTE — Telephone Encounter (Signed)
Called & spoke to the patient. Verified name & DOB. Inquired why tests are needed. Patient stated that this is needed for insurance purposes when I requested further clarification [name] stated that he will call back tomorrow because "he is busy right now".

## 2022-09-25 ENCOUNTER — Telehealth: Payer: Self-pay

## 2022-09-25 NOTE — Telephone Encounter (Signed)
Called & spoke to the patient. Verified name & DOB. Informed of message below and that all corresponding lab work will be done on next visit. Confirmed with patient that next appointment is on 11/09/2022 and patient expressed verbal understanding and agreement of all discussed.  Forms for the diabetic shoes have been received and placed in Dr.Johnson's box for review. No further questions at this time.

## 2022-09-25 NOTE — Telephone Encounter (Signed)
Pt is calling back to clarify the other testing that is needed. The testing needed is HBA1C, Kidney function urine test and the kidney function blood test.

## 2022-09-25 NOTE — Telephone Encounter (Signed)
Patient called back and stated that lab work was requested by his [facility]. He is needing a kidney function test & will call back to clarify the second lab needed.   Patient saw [facility] for diabetic shoes. Patient would like to confirm if a fax has been received from them in regard to that.

## 2022-09-25 NOTE — Telephone Encounter (Signed)
Routing to PCP

## 2022-09-25 NOTE — Telephone Encounter (Signed)
Called LVM to call back

## 2022-09-25 NOTE — Patient Outreach (Signed)
  Care Coordination   09/25/2022 Name: [name] MRN: [id number] DOB: [birth year]   Care Coordination Outreach Attempts:  SW outreached patient to introduce Care Coordination program. Patient requested SW outreach again on Monday as he is unable to complete today's call.  Follow Up Plan:  Additional outreach attempts will be made to offer the patient care coordination information and services.   Encounter Outcome:  Pt. Request to Call Back   Care Coordination Interventions:  No, not indicated    Bevelyn Ngo, BSW, CDP Social Worker, Certified Dementia Practitioner [facility] Care Management  Care Coordination [phone]

## 2022-09-28 ENCOUNTER — Telehealth: Payer: Self-pay

## 2022-09-28 NOTE — Patient Outreach (Signed)
  Care Coordination   09/28/2022 Name: [name] MRN: [id number] DOB: 1951/06/19   Care Coordination Outreach Attempts:  A second unsuccessful outreach was attempted today to offer the patient with information about available care coordination services as a benefit of their health plan.     Follow Up Plan:  Additional outreach attempts will be made to offer the patient care coordination information and services.   Encounter Outcome:  No Answer   Care Coordination Interventions:  No, not indicated    Bevelyn Ngo, BSW, CDP Social Worker, Certified Dementia Practitioner [facility] Care Management  Care Coordination [phone]

## 2022-09-30 ENCOUNTER — Ambulatory Visit: Payer: Medicare HMO | Attending: Internal Medicine | Admitting: Pharmacist

## 2022-09-30 ENCOUNTER — Encounter: Payer: Self-pay | Admitting: Pharmacist

## 2022-09-30 ENCOUNTER — Other Ambulatory Visit: Payer: Self-pay

## 2022-09-30 DIAGNOSIS — E1149 Type 2 diabetes mellitus with other diabetic neurological complication: Secondary | ICD-10-CM

## 2022-09-30 DIAGNOSIS — Z79899 Other long term (current) drug therapy: Secondary | ICD-10-CM

## 2022-09-30 DIAGNOSIS — I10 Essential (primary) hypertension: Secondary | ICD-10-CM

## 2022-09-30 MED ORDER — GLIMEPIRIDE 1 MG PO TABS
1.0000 mg | ORAL_TABLET | Freq: Every day | ORAL | 1 refills | Status: DC
  Filled 2022-09-30 – 2023-04-25 (×2): qty 90, 90d supply, fill #0
  Filled 2023-09-27: qty 90, 90d supply, fill #1

## 2022-09-30 MED ORDER — AMLODIPINE BESYLATE 10 MG PO TABS
10.0000 mg | ORAL_TABLET | Freq: Every day | ORAL | 1 refills | Status: DC
  Filled 2022-09-30 – 2023-01-27 (×2): qty 90, 90d supply, fill #0
  Filled 2023-07-27: qty 90, 90d supply, fill #1

## 2022-09-30 MED ORDER — PRAVASTATIN SODIUM 20 MG PO TABS
20.0000 mg | ORAL_TABLET | Freq: Every day | ORAL | 1 refills | Status: DC
  Filled 2022-09-30 – 2023-05-14 (×2): qty 90, 90d supply, fill #0
  Filled 2023-05-18: qty 90, 90d supply, fill #1

## 2022-09-30 NOTE — Progress Notes (Signed)
09/30/2022 Name: [name] MRN: [id number] DOB: 11/09/1950  No chief complaint on file.  [name] is a 72 y.o. year old male who presented for a telephone visit.   They were referred to the pharmacist by a quality report for assistance in managing medication access. Of note, this report showed that he was failing medication adherence measures for his diabetes and cholesterol medications.   Patient is participating in a Managed Medicaid Plan:  no  Subjective:  Care Team: Primary Care Provider: Marcine MatarJohnson, Deborah B, MD ; Next Scheduled Visit: 11/09/2022  Medication Access/Adherence  [facility] [address], [address] [place] [address] Phone: [phone] Fax: [phone]  Patient reports affordability concerns with their medications: No  Patient reports access/transportation concerns to their pharmacy: No  Patient reports adherence concerns with their medications:  No     Medication Management:  Current adherence strategy: 30 day supplies from our pharmacy. He understood that our pharmacy only filled 30-day supplies at a time. We discussed that we prefer to fill 90 for adherence.   Patient reports Poor adherence to medications  Patient reports the following barriers to adherence: filling 30 day supplies at a time.   Recent fill dates:  I note that his last fill date of glimepiride and amlodipine was 11/20/2021 for a 30 day supply. The pravastatin that was sent 04/27/2022 was not filled.   Objective:  Lab Results  Component Value Date   HGBA1C 7.0 03/31/2022    Lab Results  Component Value Date   CREATININE 1.25 (H) 05/01/2022   BUN 10 05/01/2022   NA 137 05/01/2022   K 3.4 (L) 05/01/2022   CL 103 05/01/2022   CO2 22 05/01/2022    Lab Results  Component Value Date   CHOL 223 (H) 03/31/2022   HDL 60 03/31/2022   LDLCALC 138 (H) 03/31/2022   TRIG 142 03/31/2022    CHOLHDL 3.7 03/31/2022    Medications Reviewed Today     Reviewed by Helane GuntherMayer, Gregory, DPM (Physician) on 09/09/22 at 1530  Med List Status: <None>   Medication Order Taking? Sig Documenting Provider Last Dose Status Informant  amLODipine (NORVASC) 10 MG tablet [id number] No Take 1 tablet (10 mg total) by mouth daily. Anders SimmondsMcClung, Angela M, PA-C Taking Active Self  apixaban (ELIQUIS) 5 MG TABS tablet [id number] No TAKE 1 TABLET BY MOUTH TWICE DAILY. Anders SimmondsMcClung, Angela M, [place] Taking Active Self  B Complex Vitamins (VITAMIN B COMPLEX) TABS [id number] No Take 1 tablet by mouth daily. [provider] Taking Active Self  diclofenac Sodium (VOLTAREN) 1 % GEL [id number]  Apply 2 g topically 3 (three) times daily as needed (pain). Marcine MatarJohnson, Deborah B, MD  Active   dicyclomine (BENTYL) 20 MG tablet [id number] No Take 1 tablet (20 mg total) by mouth 4 (four) times daily as needed (intestinal cramps). Zenia ResidesBanister, Pamela K, MD Taking Active   fluticasone [facility](FLONASE) 50 MCG/ACT nasal spray [id number] No Place 1 spray into both nostrils daily as needed for allergies or rhinitis. [provider] Taking Active Self           Med Note Hart [place](LAWSON, Lafe GarinBROOKE C   Fri Dec 19, 2021  2:20 PM)    glimepiride (AMARYL) 1 MG tablet [id number] No Take 1 tablet (1 mg total) by mouth daily with breakfast. Anders SimmondsMcClung, Angela M, PA-C Taking Active Self  hydrOXYzine (VISTARIL) 25 MG capsule [id number]  Take 1 capsule (25 mg total) by  mouth 2 (two) times daily as needed. Marcine Matar, MD  Active   latanoprost (XALATAN) 0.005 % ophthalmic solution [id number] No Place 1 drop into both eyes nightly.  Taking Active Self  latanoprost (XALATAN) 0.005 % ophthalmic solution [id number] No Place 1 drop into both eyes nightly.  Taking Active Self  Multiple Vitamin (MULTIVITAMIN WITH MINERALS) TABS tablet [id number] No Take 1 tablet by mouth daily. [provider] Taking Active Self  omeprazole (PRILOSEC) 20 MG capsule [id number] No Take 1  capsule (20 mg total) by mouth daily. Marcine Matar, MD Taking Active   pravastatin (PRAVACHOL) 20 MG tablet [id number] No Take 1 tablet (20 mg total) by mouth daily. Marcine Matar, MD Taking Active   sennosides-docusate sodium (SENOKOT-S) 8.6-50 MG tablet [id number] No Take 1 tablet by mouth daily. Marcine Matar, MD Taking Active Self           Med Note Alphonzo [place]   Fri Dec 19, 2021  2:22 PM)    tamsulosin (FLOMAX) 0.4 MG CAPS capsule [id number] No Take 1 capsule (0.4 mg total) by mouth daily after supper.  Patient not taking: Reported on 06/23/2022   Margaretmary Dys, MD Not Taking Active Self  triamcinolone (KENALOG) 0.1 % paste [id number] No Use as directed 1 Application in the mouth or throat 2 (two) times daily. Vida, Erda L, [place] Taking Active   triamcinolone cream (KENALOG) 0.1 % [id number]  APPLY TO AFFECTED AREA TWICE A DAY Marcine Matar, MD  Active             Assessment/Plan:   Medication Management: - Currently strategy insufficient to maintain appropriate adherence to prescribed medication regimen. We dicussed that our pharmacy will fill 90-day supplies of his HLD, HTN, and DM medications.  - Suggested use of weekly pill box to organize medications - Created list of medication, indication, and administration time. Provided to patient - Discussed collaboration with our pharmacy to provide 90-day fills.   Follow Up Plan: May with PCP.   Butch Penny, PharmD, Patsy [place], CPP Clinical Pharmacist [facility] [phone]

## 2022-10-05 ENCOUNTER — Telehealth: Payer: Self-pay

## 2022-10-05 NOTE — Patient Outreach (Signed)
  Care Coordination   10/05/2022 Name: [name] MRN: [id number] DOB: [birth year]   Care Coordination Outreach Attempts:  A third unsuccessful outreach was attempted today to offer the patient with information about available care coordination services as a benefit of their health plan.   Follow Up Plan:  No further outreach attempts will be made at this time. We have been unable to contact the patient to offer or enroll patient in care coordination services  Encounter Outcome:  No Answer   Care Coordination Interventions:  No, not indicated    Bevelyn Ngo, BSW, CDP Social Worker, Certified Dementia Practitioner [facility] Care Management  Care Coordination [phone]

## 2022-10-06 ENCOUNTER — Other Ambulatory Visit: Payer: Self-pay

## 2022-10-06 DIAGNOSIS — H401131 Primary open-angle glaucoma, bilateral, mild stage: Secondary | ICD-10-CM | POA: Diagnosis not present

## 2022-10-07 ENCOUNTER — Telehealth: Payer: Self-pay | Admitting: Internal Medicine

## 2022-10-07 DIAGNOSIS — E1149 Type 2 diabetes mellitus with other diabetic neurological complication: Secondary | ICD-10-CM

## 2022-10-07 NOTE — Telephone Encounter (Signed)
Called & spoke to the patient. Verified name & DOB. Informed that referral has been submitted. Patient expressed verbal understanding. No further questions at this time.

## 2022-10-07 NOTE — Addendum Note (Signed)
Addended by: Jonah Blue B on: 10/07/2022 01:13 PM   Modules accepted: Orders

## 2022-10-07 NOTE — Telephone Encounter (Signed)
Copied from CRM [id number]. Topic: Referral - Request for Referral >> Oct 06, 2022  3:09 PM Patsy Lager T wrote: Has patient seen PCP for this complaint? Yes.   Referral for which specialty: Ophthalmologist Preferred provider/office: unknown Reason for referral: his now ophthalmologist is retiring

## 2022-10-15 ENCOUNTER — Ambulatory Visit: Payer: Medicare HMO | Admitting: Dermatology

## 2022-10-19 NOTE — Telephone Encounter (Signed)
Diabetic shoe form were successfully faxed on 10/05/2022. Called & spoke to Josue from [facility] on 10/19/2022 and confirmed that forms were received and no further action is needed at this time.

## 2022-10-27 ENCOUNTER — Ambulatory Visit (INDEPENDENT_AMBULATORY_CARE_PROVIDER_SITE_OTHER): Payer: Medicare HMO

## 2022-10-27 DIAGNOSIS — M216X1 Other acquired deformities of right foot: Secondary | ICD-10-CM | POA: Diagnosis not present

## 2022-10-27 DIAGNOSIS — M216X2 Other acquired deformities of left foot: Secondary | ICD-10-CM | POA: Diagnosis not present

## 2022-10-27 DIAGNOSIS — E1149 Type 2 diabetes mellitus with other diabetic neurological complication: Secondary | ICD-10-CM

## 2022-10-27 NOTE — Progress Notes (Signed)
Patient presents today to pick up diabetic shoes and insoles.  Patient was dispensed 1 pair of diabetic shoes and 3 pairs of foam casted diabetic insoles.   He tried on the shoes with the insoles and the fit was satisfactory.   Will follow up next year for new order.    

## 2022-10-28 ENCOUNTER — Telehealth: Payer: Self-pay | Admitting: Internal Medicine

## 2022-10-28 NOTE — Telephone Encounter (Signed)
Called & spoke to the patient. Verified name & DOB. Informed that the last Cologuard done was in September of 2021 and will not be due until September of 2024 as it is due every 3 years. Informed patient of upcoming appointment on 11/09/2022 and can speak to Dr.Johnson if there are any further questions or concerns. Patient expressed verbal understanding. No further questions at this time.

## 2022-10-28 NOTE — Telephone Encounter (Signed)
Copied from CRM [id number]. Topic: General - Other >> Oct 28, 2022 12:53 PM Ja-Kwan M wrote: Reason for CRM: Pt stated [employer] informed him that it is time for a cologuard. Pt requests that a cologuard is ordered and he can pick it up at the office.

## 2022-10-29 DIAGNOSIS — M5416 Radiculopathy, lumbar region: Secondary | ICD-10-CM | POA: Diagnosis not present

## 2022-11-09 ENCOUNTER — Encounter: Payer: Self-pay | Admitting: Internal Medicine

## 2022-11-09 ENCOUNTER — Ambulatory Visit: Payer: Medicare HMO | Attending: Internal Medicine | Admitting: Internal Medicine

## 2022-11-09 VITALS — BP 150/81 | HR 80 | Temp 98.9°F | Resp 12 | Ht 73.0 in | Wt 189.0 lb

## 2022-11-09 DIAGNOSIS — Z7984 Long term (current) use of oral hypoglycemic drugs: Secondary | ICD-10-CM

## 2022-11-09 DIAGNOSIS — M5416 Radiculopathy, lumbar region: Secondary | ICD-10-CM | POA: Diagnosis not present

## 2022-11-09 DIAGNOSIS — E1159 Type 2 diabetes mellitus with other circulatory complications: Secondary | ICD-10-CM | POA: Diagnosis not present

## 2022-11-09 DIAGNOSIS — Z6379 Other stressful life events affecting family and household: Secondary | ICD-10-CM

## 2022-11-09 DIAGNOSIS — I152 Hypertension secondary to endocrine disorders: Secondary | ICD-10-CM | POA: Diagnosis not present

## 2022-11-09 DIAGNOSIS — E1149 Type 2 diabetes mellitus with other diabetic neurological complication: Secondary | ICD-10-CM | POA: Diagnosis not present

## 2022-11-09 DIAGNOSIS — E785 Hyperlipidemia, unspecified: Secondary | ICD-10-CM

## 2022-11-09 DIAGNOSIS — Z9181 History of falling: Secondary | ICD-10-CM

## 2022-11-09 DIAGNOSIS — E1169 Type 2 diabetes mellitus with other specified complication: Secondary | ICD-10-CM

## 2022-11-09 DIAGNOSIS — I480 Paroxysmal atrial fibrillation: Secondary | ICD-10-CM

## 2022-11-09 LAB — POCT GLYCOSYLATED HEMOGLOBIN (HGB A1C): HbA1c, POC (prediabetic range): 6.3 % (ref 5.7–6.4)

## 2022-11-09 LAB — GLUCOSE, POCT (MANUAL RESULT ENTRY): POC Glucose: 143 mg/dl — AB (ref 70–99)

## 2022-11-09 NOTE — Progress Notes (Signed)
Patient ID: ALGENE GLAWE, male    DOB: [birth year]  MRN: [id number]  CC: Back Pain and Diabetes   Subjective: Willaim Travis is a 72 y.o. male who presents for chronic disease management. 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 cocaine abuse in remission since 2005, LT pneumothorax, POAG( BL glaucoma).     Complaining of back pain flare x 1 wk.  He has had 2 surgeries done on his lower back by Dr. Dutch Quint. Saw him 3 wks age.  Told most of his pain due to some overlaying arthritis.  Pain is across lower back and radiates down the left leg to just below the knee.  On Oxycontin (which he takes rarely) and Vicodin.  The Vicodin helps but makes him drowsy.  Had 2 falls this year; last 3 wks ago. Loss balance when walking over an entry threshold. Ambulates with cane and feels stable walking with it.    HTN: Reports compliance with taking Norvasc 10 mg daily.  BP elev today because he is stress (by his church and wife) and in pain. Took Norvasc 10 mg already today. Checks BP 3x/wk and reports BP was high for last 1.5 wks due to stress  DM: Results for orders placed or performed in visit on 11/09/22  HgB A1c  Result Value Ref Range   Hemoglobin A1C     HbA1c POC (<> result, manual entry)     HbA1c, POC (prediabetic range) 6.3 5.7 - 6.4 %   HbA1c, POC (controlled diabetic range)    Glucose (CBG)  Result Value Ref Range   POC Glucose 143 (A) 70 - 99 mg/dl  Reports compliance with taking Amaryl 1 mg daily Decease appetite due to stress Had eye exam 2 wks ago Dr. Nolon Nations  HL: Last LDL was 138.  We tried changing him from Pravachol to atorvastatin last year but he did not tolerated and preferred to be back on the Pravachol.  He is taking it consistently.  A-fib: Taking Eliquis.  No bruising or bleeding.  HM: due for shingles vaccine.  Patient declines shingles  vaccine.   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   Unintended weight loss 01/29/2020   Pain due to onychomycosis of toenails of both feet 08/01/2019   Porokeratosis 08/01/2019   Influenza vaccine needed 02/24/2019   Diabetes mellitus (HCC) 02/24/2019   Hyperlipemia 11/24/2016   History of CVA with residual deficit 11/24/2016   GERD (gastroesophageal reflux disease) 08/09/2014   Atrial fibrillation (HCC)    Hemiparesis affecting left side as late effect of stroke (HCC) 05/01/2014   ERECTILE DYSFUNCTION, 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 90 tablet 1   apixaban (ELIQUIS) 5 MG TABS tablet TAKE 1 TABLET BY MOUTH TWICE DAILY. 60 tablet 3   fluticasone (FLONASE) 50 MCG/ACT nasal spray Place 1 spray into both nostrils daily as needed for allergies or rhinitis.     glimepiride (AMARYL) 1 MG tablet Take 1 tablet (1 mg total) by mouth daily with breakfast. 90 tablet 1   HYDROcodone-acetaminophen (NORCO) 10-325 MG tablet Take 1 tablet by mouth every 4 (four) hours as needed.     hydrOXYzine (VISTARIL) 25 MG capsule Take 1 capsule (25 mg total) by mouth 2 (two) times daily as needed. 60  capsule 1   Multiple Vitamin (MULTIVITAMIN WITH MINERALS) TABS tablet Take 1 tablet by mouth daily.     omeprazole (PRILOSEC) 20 MG capsule Take 1 capsule (20 mg total) by mouth daily. 30 capsule 3   pravastatin (PRAVACHOL) 20 MG tablet Take 1 tablet (20 mg total) by mouth daily. 90 tablet 1   B Complex Vitamins (VITAMIN B COMPLEX) TABS Take 1 tablet by mouth daily. (Patient not taking: Reported on 11/09/2022)     diclofenac Sodium (VOLTAREN) 1 % GEL Apply 2 g topically 3 (three) times daily as needed (pain). (Patient not taking: Reported on 11/09/2022) 100 g 0   dicyclomine (BENTYL) 20 MG tablet Take 1 tablet (20 mg total) by mouth 4 (four) times daily as needed (intestinal cramps). (Patient not taking: Reported on 11/09/2022) 40 tablet 0   latanoprost (XALATAN) 0.005 % ophthalmic solution Place 1 drop into both eyes nightly. (Patient not taking: Reported on 11/09/2022) 2.5 mL 3   latanoprost (XALATAN) 0.005 % ophthalmic solution Place 1 drop into both eyes nightly. (Patient not taking: Reported on 11/09/2022) 2.5 mL 3   tamsulosin (FLOMAX) 0.4 MG CAPS capsule Take 1 capsule (0.4 mg total) by mouth daily after supper. (Patient not taking: Reported on 11/09/2022) 30 capsule 5   No current facility-administered medications on file prior to visit.    Allergies  Allergen Reactions   Lisinopril Swelling    Throat swelling   Tramadol Hcl     "Makes me crazy"    Social History   Socioeconomic History   Marital status: Married    Spouse name: Not on file   Number of children: 5   Years of education: Not on file   Highest education level: Not on file  Occupational History   Occupation: Caregiver    Comment: retired  Tobacco Use   Smoking status: Some Days    Packs/day: 0.25    Years: 10.00    Additional pack years: 0.00    Total pack years: 2.50    Types: Pipe, Cigarettes   Smokeless tobacco: Never   Tobacco comments:    Quit 05/15/14  Vaping Use   Vaping Use: Never used  Substance and  Sexual Activity   Alcohol use: Yes    Alcohol/week: 1.0 - 2.0 standard drink of alcohol    Types: 1 - 2 Standard drinks or equivalent per week    Comment: occasional wine   Drug use: No   Sexual activity: Not Currently  Other Topics Concern   Not on file  Social History Narrative   Lives with his wife   5 children, grown.   7 grandchildren.    Social Determinants of Health   Financial Resource Strain: Low Risk  (09/30/2022)   Overall Financial Resource Strain (CARDIA)    Difficulty of Paying Living  Expenses: Not hard at all  Food Insecurity: No Food Insecurity (09/30/2022)   Hunger Vital Sign    Worried About Running Out of Food in the Last Year: Never true    Ran Out of Food in the Last Year: Never true  Transportation Needs: No Transportation Needs (09/30/2022)   PRAPARE - Administrator, Civil Service (Medical): No    Lack of Transportation (Non-Medical): No  Physical Activity: Insufficiently Active (09/30/2022)   Exercise Vital Sign    Days of Exercise per Week: 3 days    Minutes of Exercise per Session: 30 min  Stress: No Stress Concern Present (09/30/2022)   [employer] of Occupational Health - Occupational Stress Questionnaire    Feeling of Stress : Not at all  Social Connections: Moderately Integrated (09/30/2022)   Social Connection and Isolation Panel [NHANES]    Frequency of Communication with Friends and Family: More than three times a week    Frequency of Social Gatherings with Friends and Family: More than three times a week    Attends Religious Services: More than 4 times per year    Active Member of Clubs or Organizations: Yes    Attends Banker Meetings: More than 4 times per year    Marital Status: Separated  Intimate Partner Violence: Not At Risk (09/30/2022)   Humiliation, Afraid, Rape, and Kick questionnaire    Fear of Current or Ex-Partner: No    Emotionally Abused: No    Physically Abused: No    Sexually Abused: No     Family History  Problem Relation Age of Onset   Diabetes Mother    Breast cancer Mother    Diabetes Father    Breast cancer Sister    Prostate cancer Neg Hx    Colon cancer Neg Hx    Pancreatic cancer Neg Hx     Past Surgical History:  Procedure Laterality Date   CATARACT EXTRACTION  06/2017   CYSTOSCOPY  05/2017   INTERCOSTAL NERVE BLOCK Left 05/13/2020   Procedure: INTERCOSTAL NERVE BLOCK;  Surgeon: Corliss Skains, MD;  Location: [facility] OR;  Service: Thoracic;  Laterality: Left;   LUMBAR LAMINECTOMY/DECOMPRESSION MICRODISCECTOMY Right 03/11/2021   Procedure: Microdiscectomy - right - Lumbar four-Lumbar five extraforaminal;  Surgeon: Julio Sicks, MD;  Location: [facility] OR;  Service: Neurosurgery;  Laterality: Right;   LUMBAR LAMINECTOMY/DECOMPRESSION MICRODISCECTOMY Left 12/29/2021   Procedure: Extraforaminal microdiscectomy - left - L4-L5;  Surgeon: Julio Sicks, MD;  Location[facility];  Service: Neurosurgery;  Laterality: Left;  3C   PROSTATE BIOPSY     WEDGE RESECTION Left 05/13/2020   XI robotic assisted thorascopy    ROS: Review of Systems Negative except as stated above  PHYSICAL EXAM: BP (!) 150/81   Pulse 80   Temp 98.9 F (37.2 C) (Oral)   Resp 12   Ht 6\' 1"  (1.854 m)   Wt 189 lb (85.7 kg)   SpO2 98%   BMI 24.94 kg/m   Physical Exam   General appearance - alert, well appearing, older African-American male sitting in chair and in no distress.  He appears uncomfortable.  He has his cane with him. Mental status - normal mood, behavior, speech, dress, motor activity, and thought processes Neck - supple, no significant adenopathy Chest - clear to auscultation, no wheezes, rales or rhonchi, symmetric air entry Heart - normal rate, regular rhythm, normal S1, S2, no murmurs, rubs, clicks or gallops Extremities - peripheral pulses normal, no pedal edema, no clubbing  or cyanosis     09/30/2022    5:47 PM 07/10/2022    4:42 PM 04/17/2022   11:18 AM  Depression  screen PHQ 2/9  Decreased Interest 0 0 0  Down, Depressed, Hopeless 0 0 0  PHQ - 2 Score 0 0 0  Altered sleeping  0   Tired, decreased energy  0   Change in appetite  0   Feeling bad or failure about yourself   0   Trouble concentrating  0   Moving slowly or fidgety/restless  0   Suicidal thoughts  0   PHQ-9 Score  0        Latest Ref Rng & Units 05/01/2022    4:11 PM 03/31/2022    4:20 PM 12/29/2021   11:08 AM  CMP  Glucose 70 - 99 mg/dL 161  096  045   BUN 8 - 23 mg/dL 10  16  13    Creatinine 0.61 - 1.24 mg/dL 4.09  8.11  9.14   Sodium 135 - 145 mmol/L 137  138  136   Potassium 3.5 - 5.1 mmol/L 3.4  4.9  3.8   Chloride 98 - 111 mmol/L 103  102  105   CO2 22 - 32 mmol/L 22  21  21    Calcium 8.9 - 10.3 mg/dL 9.1  9.6  8.9   Total Protein 6.5 - 8.1 g/dL 6.8  7.4    Total Bilirubin 0.3 - 1.2 mg/dL 0.1  0.3    Alkaline Phos 38 - 126 U/L 63  88    AST 15 - 41 U/L 15  14    ALT 0 - 44 U/L 9  14     Lipid Panel     Component Value Date/Time   CHOL 223 (H) 03/31/2022 1620   TRIG 142 03/31/2022 1620   HDL 60 03/31/2022 1620   CHOLHDL 3.7 03/31/2022 1620   CHOLHDL 4.2 05/02/2014 0000   VLDL 72 (H) 05/02/2014 0000   LDLCALC 138 (H) 03/31/2022 1620    CBC    Component Value Date/Time   WBC 5.6 05/01/2022 1611   RBC 4.30 05/01/2022 1611   HGB 13.0 05/01/2022 1611   HGB 13.8 01/29/2020 1022   HCT 37.2 (L) 05/01/2022 1611   HCT 40.6 01/29/2020 1022   PLT 341 05/01/2022 1611   PLT 268 01/29/2020 1022   MCV 86.5 05/01/2022 1611   MCV 89 01/29/2020 1022   MCH 30.2 05/01/2022 1611   MCHC 34.9 05/01/2022 1611   RDW 14.2 05/01/2022 1611   RDW 13.8 01/29/2020 1022   LYMPHSABS 0.7 11/17/2021 1526   MONOABS 0.2 11/17/2021 1526   EOSABS 0.0 11/17/2021 1526   BASOSABS 0.0 11/17/2021 1526    ASSESSMENT AND PLAN:  1. Type 2 diabetes mellitus with other neurologic complication, without long-term current use of insulin (HCC) At goal.  Continue Amaryl.  Encouraged him to  drink some Ensure or Glucerna shakes to help supplement his meals when his appetite is down. - HgB A1c - Glucose (CBG) - CBC; Future - Comprehensive metabolic panel; Future  2. Hypertension associated with type 2 diabetes mellitus (HCC) Not at goal but improved on repeat blood pressure check.  He feels blood pressure is elevated today because he is in pain.  We will have him check blood pressure at least twice a week and record readings.  Follow-up with clinical pharmacist in 1 month for recheck.  Continue amlodipine 10 mg daily.  3. Hyperlipidemia associated with type  2 diabetes mellitus (HCC) Continue pravastatin.  4. PAF (paroxysmal atrial fibrillation) (HCC) On Eliquis.  5. History of recent fall Recommend referral to physical therapy for gait safety training.  Patient declines at this time.  6. Lumbar radiculopathy On Vicodin through his neurosurgeon.  He tells me he also has OxyContin at home which he takes rarely when the pain level is very high.  Advised that these medications can cause drowsiness and should not take them when he has to drive.  7. Stressful life event affecting family Patient states that he is dealing with the situation with his wife and church.    Patient was given the opportunity to ask questions.  Patient verbalized understanding of the plan and was able to repeat key elements of the plan.   This documentation was completed using Paediatric nurse.  Any transcriptional errors are unintentional.  Orders Placed This Encounter  Procedures   CBC   Comprehensive metabolic panel   HgB A1c   Glucose (CBG)     Requested Prescriptions    No prescriptions requested or ordered in this encounter    Return in about 4 months (around 03/12/2023) for BP check Luke in 4 wks.  Jonah Blue, MD, FACP

## 2022-11-09 NOTE — Progress Notes (Signed)
F/u DM- CBG- 143 A1C-6.5 Back pain, constant 10/10  Taking pain medication from  Dr.  Dutch Quint, Neurology

## 2022-11-09 NOTE — Patient Instructions (Signed)
Please check your blood pressure at least once a week.  Bring your readings with you when you come to see our clinical pharmacist in 1 month.  Remember to stop at the lab when you come to that appointment.

## 2022-11-11 ENCOUNTER — Telehealth: Payer: Self-pay

## 2022-11-11 ENCOUNTER — Encounter: Payer: Self-pay | Admitting: Internal Medicine

## 2022-11-11 NOTE — Telephone Encounter (Signed)
Copied from CRM [id number]. Topic: General - Other >> Nov 11, 2022  8:11 AM [name] wrote: Reason for CRM: The patient has called to share that their BP was 135/81 taken today 11/11/22 at 7:30 AM Approx   Please contact further if needed

## 2022-11-30 ENCOUNTER — Other Ambulatory Visit: Payer: Self-pay

## 2022-12-02 ENCOUNTER — Other Ambulatory Visit: Payer: Self-pay

## 2022-12-07 ENCOUNTER — Other Ambulatory Visit: Payer: Self-pay

## 2022-12-07 ENCOUNTER — Other Ambulatory Visit: Payer: Self-pay | Admitting: Internal Medicine

## 2022-12-07 DIAGNOSIS — R0982 Postnasal drip: Secondary | ICD-10-CM

## 2022-12-07 MED ORDER — LATANOPROST 0.005 % OP SOLN
1.0000 [drp] | Freq: Every evening | OPHTHALMIC | 1 refills | Status: AC
  Filled 2022-12-07: qty 7.5, 75d supply, fill #0
  Filled 2023-10-17: qty 7.5, 75d supply, fill #1

## 2022-12-07 MED ORDER — FLUTICASONE PROPIONATE 50 MCG/ACT NA SUSP
NASAL | 1 refills | Status: DC
Start: 2022-12-07 — End: 2023-04-05
  Filled 2022-12-07: qty 16, 60d supply, fill #0

## 2022-12-10 ENCOUNTER — Ambulatory Visit: Payer: Medicare HMO | Admitting: Podiatry

## 2022-12-14 ENCOUNTER — Ambulatory Visit (INDEPENDENT_AMBULATORY_CARE_PROVIDER_SITE_OTHER): Payer: Medicare HMO | Admitting: Podiatry

## 2022-12-14 ENCOUNTER — Encounter: Payer: Self-pay | Admitting: Podiatry

## 2022-12-14 DIAGNOSIS — E1149 Type 2 diabetes mellitus with other diabetic neurological complication: Secondary | ICD-10-CM

## 2022-12-14 DIAGNOSIS — B351 Tinea unguium: Secondary | ICD-10-CM

## 2022-12-14 DIAGNOSIS — M79674 Pain in right toe(s): Secondary | ICD-10-CM

## 2022-12-14 DIAGNOSIS — M79675 Pain in left toe(s): Secondary | ICD-10-CM | POA: Diagnosis not present

## 2022-12-14 NOTE — Progress Notes (Signed)
This patient returns to my office for at risk foot care.  This patient requires this care by a professional since this patient will be at risk due to having diabetes.  This patient is unable to cut nails himself since the patient cannot reach his nails.These nails are painful walking and wearing shoes.  This patient presents for at risk foot care today.  General Appearance  Alert, conversant and in no acute stress.  Vascular  Dorsalis pedis and posterior tibial  pulses are palpable  bilaterally.  Capillary return is within normal limits  bilaterally. Temperature is within normal limits  bilaterally.  Neurologic  Senn-Weinstein monofilament wire test diminished bilaterally. Muscle power within normal limits bilaterally.  Nails Thick disfigured discolored nails with subungual debris  from hallux to fifth toes bilaterally. No evidence of bacterial infection or drainage bilaterally.  Orthopedic  No limitations of motion  feet .  No crepitus or effusions noted.  No bony pathology or digital deformities noted.  Plantar flexed fifth metatarsal  B/L.  Skin  normotropic skin with no porokeratosis noted bilaterally.  No signs of infections or ulcers noted.   Asymptomatic callus sub 5th  B/l.  Onychomycosis  Pain in right toes  Pain in left toes  Pes planus    Consent was obtained for treatment procedures.   Mechanical debridement of nails 1-5  bilaterally performed with a nail nipper.  Filed with dremel without incident.    Return office visit   3 months                  Told patient to return for periodic foot care and evaluation due to potential at risk complications.   Helane Gunther DPM

## 2023-01-07 ENCOUNTER — Ambulatory Visit: Payer: Medicare HMO

## 2023-01-07 ENCOUNTER — Ambulatory Visit: Payer: Medicare HMO | Attending: Internal Medicine | Admitting: Pharmacist

## 2023-01-07 ENCOUNTER — Encounter: Payer: Self-pay | Admitting: Pharmacist

## 2023-01-07 VITALS — BP 132/67 | HR 81

## 2023-01-07 DIAGNOSIS — I10 Essential (primary) hypertension: Secondary | ICD-10-CM

## 2023-01-07 DIAGNOSIS — E1149 Type 2 diabetes mellitus with other diabetic neurological complication: Secondary | ICD-10-CM

## 2023-01-07 DIAGNOSIS — Z8546 Personal history of malignant neoplasm of prostate: Secondary | ICD-10-CM | POA: Diagnosis not present

## 2023-01-07 NOTE — Progress Notes (Signed)
   S:     No chief complaint on file.  72 y.o. male who presents for hypertension evaluation, education, and management.  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 Patient was referred and last seen by Primary Care Provider, Dr. Laural Benes, on 11/09/2022.   At last visit, BP was elevated. Pt tells me today he had a lot going on prior to that visit.   Today, patient arrives in spirits and presents without assistance. Denies dizziness, headache, blurred vision, swelling. Patient reports hypertension is longstanding.   Family/Social history:  -Fhx: DM -Tobacco: some day smoker  -Alcohol: none  Medication adherence reported. Patient has taken BP medications today.   Current antihypertensives include: amlodipine 10 mg daily  Reported home BP readings: reports 120s/80s  Patient reported dietary habits:  -Compliant with sodium restriction -Denies excessive caffeine intake   Patient-reported exercise habits: none reported   O:  Vitals:   01/07/23 1422  BP: 132/67  Pulse: 81    Last 3 Office BP readings: BP Readings from Last 3 Encounters:  01/07/23 132/67  11/09/22 (!) 150/81  07/10/22 136/82    BMET    Component Value Date/Time   NA 137 05/01/2022 1611   NA 138 03/31/2022 1620   K 3.4 (L) 05/01/2022 1611   CL 103 05/01/2022 1611   CO2 22 05/01/2022 1611   GLUCOSE 144 (H) 05/01/2022 1611   BUN 10 05/01/2022 1611   BUN 16 03/31/2022 1620   CREATININE 1.25 (H) 05/01/2022 1611   CALCIUM 9.1 05/01/2022 1611   GFRNONAA >60 05/01/2022 1611   GFRAA 74 05/27/2020 1338    Renal function: CrCl cannot be calculated (Patient's most recent lab result is older than the maximum 21 days allowed.).  Clinical ASCVD: Yes  The ASCVD Risk score (Arnett DK, et al., 2019) failed to calculate for the following reasons:   The patient has a prior MI or stroke diagnosis  Patient is  participating in a Managed Medicaid Plan: no   A/P: Hypertension diagnosed currently at goal on current medications. BP goal < 130/80 mmHg. Medication adherence appears appropriate.  -Continued amlodipine 10 mg .  -Patient educated on purpose, proper use, and potential adverse effects of amlodipine.  -F/u labs ordered - CMP, CBC, PSA per PCP. -Counseled on lifestyle modifications for blood pressure control including reduced dietary sodium, increased exercise, adequate sleep. -Encouraged patient to check BP at home and bring log of readings to next visit. Counseled on proper use of home BP cuff.   Results reviewed and written information provided.    Written patient instructions provided. Patient verbalized understanding of treatment plan.  Total time in face to face counseling 20 minutes.    Follow-up:  PCP clinic visit in 03/12/2023.   Butch Penny, PharmD, Patsy Baltimore, CPP Clinical Pharmacist [facility] & [facility] [phone]

## 2023-01-08 LAB — CBC
Hematocrit: 38.2 % (ref 37.5–51.0)
Hemoglobin: 13.5 g/dL (ref 13.0–17.7)
MCH: 30.7 pg (ref 26.6–33.0)
MCHC: 35.3 g/dL (ref 31.5–35.7)
MCV: 87 fL (ref 79–97)
Platelets: 244 10*3/uL (ref 150–450)
RBC: 4.4 x10E6/uL (ref 4.14–5.80)
RDW: 13.4 % (ref 11.6–15.4)
WBC: 3.3 10*3/uL — ABNORMAL LOW (ref 3.4–10.8)

## 2023-01-08 LAB — COMPREHENSIVE METABOLIC PANEL
ALT: 9 IU/L (ref 0–44)
AST: 10 IU/L (ref 0–40)
Albumin: 4.3 g/dL (ref 3.9–4.9)
Alkaline Phosphatase: 87 IU/L (ref 44–121)
BUN/Creatinine Ratio: 17 (ref 10–24)
BUN: 22 mg/dL (ref 8–27)
Bilirubin Total: 0.3 mg/dL (ref 0.0–1.2)
CO2: 20 mmol/L (ref 20–29)
Calcium: 9.2 mg/dL (ref 8.6–10.2)
Chloride: 104 mmol/L (ref 96–106)
Creatinine, Ser: 1.28 mg/dL — ABNORMAL HIGH (ref 0.76–1.27)
Globulin, Total: 2.8 g/dL (ref 1.5–4.5)
Glucose: 119 mg/dL — ABNORMAL HIGH (ref 70–99)
Potassium: 4.3 mmol/L (ref 3.5–5.2)
Sodium: 139 mmol/L (ref 134–144)
Total Protein: 7.1 g/dL (ref 6.0–8.5)
eGFR: 60 mL/min/{1.73_m2} (ref >59)

## 2023-01-08 LAB — PSA: Prostate Specific Ag, Serum: 0.4 ng/mL (ref 0.0–4.0)

## 2023-01-14 ENCOUNTER — Telehealth: Payer: Self-pay

## 2023-01-14 NOTE — Telephone Encounter (Signed)
Copied from CRM [id number]. Topic: General - Inquiry >> Jan 14, 2023  3:27 PM Marlow Baars wrote: Reason for CRM: The patient called in stating since his recent PSA score was 0.4 he wants to speak with his provider as soon as possible to talk about procedure to remove his prostate before it is too late age wise. He has an appt in September he needs to speak with his provider way before then. Please assist him further

## 2023-01-15 NOTE — Telephone Encounter (Signed)
Called & spoke to the patient. Verified name & DOB. Patient confirmed comfort with telehealth visit. Appointment scheduled for  at 8:10 a.m. Patient confirmed appointment.

## 2023-01-19 ENCOUNTER — Telehealth (HOSPITAL_BASED_OUTPATIENT_CLINIC_OR_DEPARTMENT_OTHER): Payer: Medicare HMO | Admitting: Internal Medicine

## 2023-01-19 DIAGNOSIS — C61 Malignant neoplasm of prostate: Secondary | ICD-10-CM | POA: Diagnosis not present

## 2023-01-19 DIAGNOSIS — Z8546 Personal history of malignant neoplasm of prostate: Secondary | ICD-10-CM

## 2023-01-19 NOTE — Progress Notes (Signed)
Patient ID: [name], male   DOB: 11/12/1950, 72 y.o.   MRN: [id number] Virtual Visit via Video Note  I connected with Quamel C Olazabal on 01/19/2023 at 8:12 AM by a video enabled telemedicine application and verified that I am speaking with the correct person using two identifiers.  Location: Patient: home Provider: Office   I discussed the limitations of evaluation and management by telemedicine and the availability of in person appointments. The patient expressed understanding and agreed to proceed.  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 cocaine abuse in remission since 2005, LT pneumothorax, POAG( BL glaucoma).     Patient wanting my opinion on whether he should proceed with having prostatectomy or not for his underlying diagnosis of prostate CA. Patient diagnosed 10/2020 when his PSA was 33. Seen by [facility] Dr. Mena Goes.  Patient states he was told that he has 3 aggressive areas around the prostate.  He was given 3 options 1 of which was prostatectomy.  At the time he opted for XRT and HRT.  Did HRT for a year and did not tolerate the side effects so he stopped it.  Last seen by his urologist less than 6 months ago.  PSA was 0.3.  We did recent PSA on last visit and that was 0.4.  When last seen by his urologist, reports being told that things are looking good but they still recommended HRT due to the 3 aggressive areas around the prostate. At this time patient is concerned because his wife was recently diagnosed with vaginal cancer.  He feels that he still has at least 10 more years if not more of survivability.  He is concerned that his insurance [employer] is cutting back on the amount that they cover.  With these considerations in mind, he is leaning now towards having the prostate removed.  Outpatient Encounter Medications as of 01/19/2023  Medication Sig    amLODipine (NORVASC) 10 MG tablet Take 1 tablet (10 mg total) by mouth daily.   apixaban (ELIQUIS) 5 MG TABS tablet TAKE 1 TABLET BY MOUTH TWICE DAILY.   B Complex Vitamins (VITAMIN B COMPLEX) TABS Take 1 tablet by mouth daily. (Patient not taking: Reported on 11/09/2022)   diclofenac Sodium (VOLTAREN) 1 % GEL Apply 2 g topically 3 (three) times daily as needed (pain). (Patient not taking: Reported on 11/09/2022)   dicyclomine (BENTYL) 20 MG tablet Take 1 tablet (20 mg total) by mouth 4 (four) times daily as needed (intestinal cramps). (Patient not taking: Reported on 11/09/2022)   fluticasone (FLONASE) 50 MCG/ACT nasal spray PLACE 1 SPRAY INTO BOTH NOSTRILS DAILY AS NEEDED FOR ALLERGIES OR RHINITIS.   glimepiride (AMARYL) 1 MG tablet Take 1 tablet (1 mg total) by mouth daily with breakfast.   HYDROcodone-acetaminophen (NORCO) 10-325 MG tablet Take 1 tablet by mouth every 4 (four) hours as needed.   hydrOXYzine (VISTARIL) 25 MG capsule Take 1 capsule (25 mg total) by mouth 2 (two) times daily as needed.   latanoprost (XALATAN) 0.005 % ophthalmic solution Place 1 drop into both eyes nightly. (Patient not taking: Reported on 11/09/2022)   latanoprost (XALATAN) 0.005 % ophthalmic solution Place 1 drop into both eyes nightly.   Multiple Vitamin (MULTIVITAMIN WITH MINERALS) TABS tablet Take 1 tablet by mouth daily.   omeprazole (PRILOSEC) 20 MG capsule Take 1 capsule (20 mg total) by mouth  daily.   pravastatin (PRAVACHOL) 20 MG tablet Take 1 tablet (20 mg total) by mouth daily.   tamsulosin (FLOMAX) 0.4 MG CAPS capsule Take 1 capsule (0.4 mg total) by mouth daily after supper. (Patient not taking: Reported on 11/09/2022)   No facility-administered encounter medications on file as of 01/19/2023.      Observations/Objective:  Lab Results  Component Value Date   PSA1 0.4 01/07/2023  Pleasant elderly African-American male lying down in NAD   Assessment and Plan: 1. History of prostate cancer -Informed  patient that the ultimate decision would be his and that his urologist would probably be able to better counsel him on course moving forward.  However I would give my opinion.  I told him that prostate cancer is a slow-growing process and management depends on several factors including the stage/whether or not there is metastasis, life expectancy/functional status of the patient and overall patient value. -What I am hearing is that he wants to remain independent as long as he could and he feels at this time that his life expectancy may be at least 10 years.  Advised that he ask his urologist about whether the expected trajectory of the ds may be recurrence with increase PSA over the next 5 years, then I would recommend having a prostatectomy especially if he does not desire HRT.  He plans to have this discussion with his urologist on his next visit.   Follow Up Instructions: As previously scheduled.   I discussed the assessment and treatment plan with the patient. The patient was provided an opportunity to ask questions and all were answered. The patient agreed with the plan and demonstrated an understanding of the instructions.   The patient was advised to call back or seek an in-person evaluation if the symptoms worsen or if the condition fails to improve as anticipated.  I spent 20 minutes dedicated to the care of this patient on the date of this encounter to include previsit review of of records, face-to-face time with the patient discussing diagnosis and management and postvisit dictation.  This note has been created with Education officer, environmental. Any transcriptional errors are unintentional.  Jonah Blue, MD

## 2023-01-21 ENCOUNTER — Other Ambulatory Visit: Payer: Self-pay | Admitting: Physician Assistant

## 2023-01-21 ENCOUNTER — Other Ambulatory Visit: Payer: Self-pay

## 2023-01-21 DIAGNOSIS — I69354 Hemiplegia and hemiparesis following cerebral infarction affecting left non-dominant side: Secondary | ICD-10-CM

## 2023-01-21 MED ORDER — APIXABAN 5 MG PO TABS
5.0000 mg | ORAL_TABLET | Freq: Two times a day (BID) | ORAL | 1 refills | Status: AC
Start: 2023-01-21 — End: ?
  Filled 2023-01-21 – 2023-01-28 (×2): qty 180, 90d supply, fill #0

## 2023-01-27 ENCOUNTER — Other Ambulatory Visit: Payer: Self-pay

## 2023-01-28 ENCOUNTER — Other Ambulatory Visit: Payer: Self-pay

## 2023-02-24 ENCOUNTER — Telehealth: Payer: Self-pay | Admitting: Internal Medicine

## 2023-02-24 DIAGNOSIS — Z1211 Encounter for screening for malignant neoplasm of colon: Secondary | ICD-10-CM

## 2023-02-24 NOTE — Telephone Encounter (Signed)
Copied from CRM [id number]. Topic: General - Other >> Feb 24, 2023  3:52 PM Ja-Kwan M wrote: Reason for CRM: Pt requests that a cologuard be ordered and shipped to the office. Pt stated that he will come to pick it up.

## 2023-02-24 NOTE — Telephone Encounter (Signed)
Cologuard can not be shipped to the office.  Has to be shipped to pt's address.  Should I go ahead and order?

## 2023-02-25 NOTE — Addendum Note (Signed)
Addended by: Jonah Blue B on: 02/25/2023 06:34 PM   Modules accepted: Orders

## 2023-02-25 NOTE — Telephone Encounter (Signed)
Called but no answer. LVM to call back.  

## 2023-03-12 ENCOUNTER — Ambulatory Visit: Payer: Medicare HMO | Admitting: Internal Medicine

## 2023-03-15 ENCOUNTER — Ambulatory Visit (INDEPENDENT_AMBULATORY_CARE_PROVIDER_SITE_OTHER): Payer: Medicare HMO | Admitting: Podiatry

## 2023-03-15 ENCOUNTER — Encounter: Payer: Self-pay | Admitting: Podiatry

## 2023-03-15 DIAGNOSIS — E1149 Type 2 diabetes mellitus with other diabetic neurological complication: Secondary | ICD-10-CM

## 2023-03-15 DIAGNOSIS — M79675 Pain in left toe(s): Secondary | ICD-10-CM

## 2023-03-15 DIAGNOSIS — B351 Tinea unguium: Secondary | ICD-10-CM | POA: Diagnosis not present

## 2023-03-15 DIAGNOSIS — M79674 Pain in right toe(s): Secondary | ICD-10-CM | POA: Diagnosis not present

## 2023-03-15 NOTE — Progress Notes (Signed)
This patient returns to my office for at risk foot care.  This patient requires this care by a professional since this patient will be at risk due to having diabetes.  This patient is unable to cut nails himself since the patient cannot reach his nails.These nails are painful walking and wearing shoes.  This patient presents for at risk foot care today.  General Appearance  Alert, conversant and in no acute stress.  Vascular  Dorsalis pedis and posterior tibial  pulses are palpable  bilaterally.  Capillary return is within normal limits  bilaterally. Temperature is within normal limits  bilaterally.  Neurologic  Senn-Weinstein monofilament wire test diminished bilaterally. Muscle power within normal limits bilaterally.  Nails Thick disfigured discolored nails with subungual debris  from hallux to fifth toes bilaterally. No evidence of bacterial infection or drainage bilaterally.  Orthopedic  No limitations of motion  feet .  No crepitus or effusions noted.  No bony pathology or digital deformities noted.  Plantar flexed fifth metatarsal  B/L.  Skin  normotropic skin with no porokeratosis noted bilaterally.  No signs of infections or ulcers noted.   Asymptomatic callus sub 5th  B/l.  Onychomycosis  Pain in right toes  Pain in left toes  Pes planus    Consent was obtained for treatment procedures.   Mechanical debridement of nails 1-5  bilaterally performed with a nail nipper.  Filed with dremel without incident.    Return office visit   3 months                  Told patient to return for periodic foot care and evaluation due to potential at risk complications.   Helane Gunther DPM

## 2023-03-28 LAB — COLOGUARD

## 2023-04-05 ENCOUNTER — Ambulatory Visit: Payer: Medicare HMO | Attending: Internal Medicine | Admitting: Internal Medicine

## 2023-04-05 ENCOUNTER — Encounter: Payer: Self-pay | Admitting: Internal Medicine

## 2023-04-05 ENCOUNTER — Other Ambulatory Visit: Payer: Self-pay

## 2023-04-05 VITALS — BP 137/83 | HR 91 | Ht 73.0 in | Wt 191.0 lb

## 2023-04-05 DIAGNOSIS — R0982 Postnasal drip: Secondary | ICD-10-CM

## 2023-04-05 DIAGNOSIS — I152 Hypertension secondary to endocrine disorders: Secondary | ICD-10-CM

## 2023-04-05 DIAGNOSIS — F419 Anxiety disorder, unspecified: Secondary | ICD-10-CM

## 2023-04-05 DIAGNOSIS — E1169 Type 2 diabetes mellitus with other specified complication: Secondary | ICD-10-CM | POA: Diagnosis not present

## 2023-04-05 DIAGNOSIS — E119 Type 2 diabetes mellitus without complications: Secondary | ICD-10-CM

## 2023-04-05 DIAGNOSIS — E1159 Type 2 diabetes mellitus with other circulatory complications: Secondary | ICD-10-CM

## 2023-04-05 DIAGNOSIS — F17210 Nicotine dependence, cigarettes, uncomplicated: Secondary | ICD-10-CM | POA: Diagnosis not present

## 2023-04-05 DIAGNOSIS — Z1211 Encounter for screening for malignant neoplasm of colon: Secondary | ICD-10-CM

## 2023-04-05 DIAGNOSIS — E785 Hyperlipidemia, unspecified: Secondary | ICD-10-CM

## 2023-04-05 DIAGNOSIS — Z23 Encounter for immunization: Secondary | ICD-10-CM

## 2023-04-05 DIAGNOSIS — Z7984 Long term (current) use of oral hypoglycemic drugs: Secondary | ICD-10-CM

## 2023-04-05 DIAGNOSIS — E1149 Type 2 diabetes mellitus with other diabetic neurological complication: Secondary | ICD-10-CM

## 2023-04-05 LAB — POCT GLYCOSYLATED HEMOGLOBIN (HGB A1C): HbA1c, POC (controlled diabetic range): 7.2 % — AB (ref 0.0–7.0)

## 2023-04-05 LAB — GLUCOSE, POCT (MANUAL RESULT ENTRY): POC Glucose: 167 mg/dL — AB (ref 70–99)

## 2023-04-05 MED ORDER — FLUTICASONE PROPIONATE 50 MCG/ACT NA SUSP
1.0000 | Freq: Every day | NASAL | 1 refills | Status: AC
  Filled 2023-04-05: qty 16, 60d supply, fill #0
  Filled 2023-10-17: qty 16, 60d supply, fill #1

## 2023-04-05 MED ORDER — LORATADINE 10 MG PO TABS
10.0000 mg | ORAL_TABLET | Freq: Every day | ORAL | 1 refills | Status: AC | PRN
Start: 2023-04-05 — End: ?
  Filled 2023-04-05: qty 30, 30d supply, fill #0

## 2023-04-05 NOTE — Progress Notes (Signed)
Patient ID: MACARIO NEYER, male    DOB: 02/28/1951  MRN: [id number]  CC: Diabetes (DM f/u. Med refill. /No to flu vax. )   Subjective: Ruslan Gantert is a 72 y.o. male who presents for chronic ds management. 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 cocaine abuse in remission since 2005, LT pneumothorax, POAG( BL glaucoma).    HM:  never received the cologuard kit.  He is not sure who had sent back the empty kit to [employer].  Prostate CA: Decided to wait until wife has completed her cancer treatment in December before he has prostate surgery.  DM: Results for orders placed or performed in visit on 04/05/23  POCT glycosylated hemoglobin (Hb A1C)  Result Value Ref Range   Hemoglobin A1C     HbA1c POC (<> result, manual entry)     HbA1c, POC (prediabetic range)     HbA1c, POC (controlled diabetic range) 7.2 (A) 0.0 - 7.0 %  POCT glucose (manual entry)  Result Value Ref Range   POC Glucose 167 (A) 70 - 99 mg/dl  Reports compliance with taking Amaryl 1 mg daily  Checks BS 3x/wk.  Range in low 100.  No lows Staying away from process foods.  Not eating as much veggies that he needs to due to some financial limitations.  However he will be getting some vouchers for food assistance through his insurance.     Saw Dr. Nile Riggs earlier this yr.  Appt with [facility] 06/2023 for diabetic eye exam.  HTN: Reports compliance with taking Norvasc 10 mg daily and took already for today. Checks BP daily.  Range 125-130/upper 80 Limits salt.   A.fib: Compliant with Eliquis.  No bruising or bleeding.  Tob dep: Smokes a pipe 3 x a wk  Reports some postnasal drainage on the left side x 3 weeks.  No pain over the sinuses. 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   Unintended weight loss 01/29/2020   Pain due to onychomycosis of toenails of both feet 08/01/2019   Porokeratosis 08/01/2019   Influenza vaccine needed 02/24/2019   Diabetes mellitus (HCC) 02/24/2019   Hyperlipemia 11/24/2016   History of CVA with residual deficit 11/24/2016   GERD (gastroesophageal reflux disease) 08/09/2014   Atrial fibrillation (HCC)    Hemiparesis affecting left side as late effect of stroke (HCC) 05/01/2014   ERECTILE DYSFUNCTION, 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 90 tablet 1   apixaban (ELIQUIS) 5 MG TABS tablet Take 1 tablet (5 mg total) by mouth 2 (two) times daily. 180 tablet 1  B Complex Vitamins (VITAMIN B COMPLEX) TABS Take 1 tablet by mouth daily.     diclofenac Sodium (VOLTAREN) 1 % GEL Apply 2 g topically 3 (three) times daily as needed (pain). 100 g 0   dicyclomine (BENTYL) 20 MG tablet Take 1 tablet (20 mg total) by mouth 4 (four) times daily as needed (intestinal cramps). 40 tablet 0   fluticasone (FLONASE) 50 MCG/ACT nasal spray PLACE 1 SPRAY INTO BOTH NOSTRILS DAILY AS NEEDED FOR ALLERGIES OR RHINITIS. 16 g 1   glimepiride (AMARYL) 1 MG tablet Take 1 tablet (1 mg total) by mouth daily with breakfast. 90 tablet 1   HYDROcodone-acetaminophen  (NORCO) 10-325 MG tablet Take 1 tablet by mouth every 4 (four) hours as needed.     hydrOXYzine (VISTARIL) 25 MG capsule Take 1 capsule (25 mg total) by mouth 2 (two) times daily as needed. 60 capsule 1   latanoprost (XALATAN) 0.005 % ophthalmic solution Place 1 drop into both eyes nightly. 2.5 mL 3   latanoprost (XALATAN) 0.005 % ophthalmic solution Place 1 drop into both eyes nightly. 7.5 mL 1   Multiple Vitamin (MULTIVITAMIN WITH MINERALS) TABS tablet Take 1 tablet by mouth daily.     omeprazole (PRILOSEC) 20 MG capsule Take 1 capsule (20 mg total) by mouth daily. 30 capsule 3   pravastatin (PRAVACHOL) 20 MG tablet Take 1 tablet (20 mg total) by mouth daily. 90 tablet 1   tamsulosin (FLOMAX) 0.4 MG CAPS capsule Take 1 capsule (0.4 mg total) by mouth daily after supper. 30 capsule 5   No current facility-administered medications on file prior to visit.    Allergies  Allergen Reactions   Lisinopril Swelling    Throat swelling   Tramadol Hcl     "Makes me crazy"    Social History   Socioeconomic History   Marital status: Married    Spouse name: Not on file   Number of children: 5   Years of education: Not on file   Highest education level: Not on file  Occupational History   Occupation: Caregiver    Comment: retired  Tobacco Use   Smoking status: Some Days    Current packs/day: 0.25    Average packs/day: 0.3 packs/day for 10.0 years (2.5 ttl pk-yrs)    Types: Pipe, Cigarettes   Smokeless tobacco: Never   Tobacco comments:    Quit 05/15/14  Vaping Use   Vaping status: Never Used  Substance and Sexual Activity   Alcohol use: Yes    Alcohol/week: 1.0 - 2.0 standard drink of alcohol    Types: 1 - 2 Standard drinks or equivalent per week    Comment: occasional wine   Drug use: No   Sexual activity: Not Currently  Other Topics Concern   Not on file  Social History Narrative   Lives with his wife   5 children, grown.   7 grandchildren.    Social Determinants of Health    Financial Resource Strain: Low Risk  (09/30/2022)   Overall Financial Resource Strain (CARDIA)    Difficulty of Paying Living Expenses: Not hard at all  Food Insecurity: No Food Insecurity (09/30/2022)   Hunger Vital Sign    Worried About Running Out of Food in the Last Year: Never true    Ran Out of Food in the Last Year: Never true  Transportation Needs: No Transportation Needs (09/30/2022)   PRAPARE - Administrator, Civil Service (Medical): No    Lack of Transportation (Non-Medical): No  Physical Activity: Insufficiently Active (09/30/2022)   Exercise Vital Sign    Days of Exercise per Week: 3 days    Minutes of Exercise per Session: 30 min  Stress: No Stress Concern Present (09/30/2022)   [employer] of Occupational Health - Occupational Stress Questionnaire    Feeling of Stress : Not at all  Social Connections: Moderately Integrated (09/30/2022)   Social Connection and Isolation Panel [NHANES]    Frequency of Communication with Friends and Family: More than three times a week    Frequency of Social Gatherings with Friends and Family: More than three times a week    Attends Religious Services: More than 4 times per year    Active Member of Clubs or Organizations: Yes    Attends Banker Meetings: More than 4 times per year    Marital Status: Separated  Intimate Partner Violence: Not At Risk (09/30/2022)   Humiliation, Afraid, Rape, and Kick questionnaire    Fear of Current or Ex-Partner: No    Emotionally Abused: No    Physically Abused: No    Sexually Abused: No    Family History  Problem Relation Age of Onset   Diabetes Mother    Breast cancer Mother    Diabetes Father    Breast cancer Sister    Prostate cancer Neg Hx    Colon cancer Neg Hx    Pancreatic cancer Neg Hx     Past Surgical History:  Procedure Laterality Date   CATARACT EXTRACTION  06/2017   CYSTOSCOPY  05/2017   INTERCOSTAL NERVE BLOCK Left 05/13/2020   Procedure:  INTERCOSTAL NERVE BLOCK;  Surgeon: Corliss Skains, MD;  Location: [facility] OR;  Service: Thoracic;  Laterality: Left;   LUMBAR LAMINECTOMY/DECOMPRESSION MICRODISCECTOMY Right 03/11/2021   Procedure: Microdiscectomy - right - Lumbar four-Lumbar five extraforaminal;  Surgeon: Julio Sicks, MD;  Location: [facility] OR;  Service: Neurosurgery;  Laterality: Right;   LUMBAR LAMINECTOMY/DECOMPRESSION MICRODISCECTOMY Left 12/29/2021   Procedure: Extraforaminal microdiscectomy - left - L4-L5;  Surgeon: Julio Sicks, MD;  Location: [facility] OR;  Service: Neurosurgery;  Laterality: Left;  3C   PROSTATE BIOPSY     WEDGE RESECTION Left 05/13/2020   XI robotic assisted thorascopy    ROS: Review of Systems Negative except as stated above  PHYSICAL EXAM: BP 137/83 (BP Location: Left Arm, Patient Position: Sitting, Cuff Size: Normal)   Pulse 91   Ht 6\' 1"  (1.854 m)   Wt 191 lb (86.6 kg)   SpO2 97%   BMI 25.20 kg/m   Physical Exam Repeat 140/90  General appearance - alert, well appearing, and in no distress.  He ambulates with a cane Mental status - normal mood, behavior, speech, dress, motor activity, and thought processes Neck - supple, no significant adenopathy Nostril: Mild enlargement of nasal turbinates Chest - clear to auscultation, no wheezes, rales or rhonchi, symmetric air entry Heart - normal rate, regular rhythm, normal S1, S2, no murmurs, rubs, clicks or gallops Extremities - peripheral pulses normal, no pedal edema, no clubbing or cyanosis     Latest Ref Rng & Units 01/07/2023    2:24 PM 05/01/2022    4:11 PM 03/31/2022    4:20 PM  CMP  Glucose 70 - 99 mg/dL 629  528  413   BUN 8 - 27 mg/dL 22  10  16    Creatinine 0.76 - 1.27 mg/dL 2.44  0.10  2.72   Sodium 134 - 144 mmol/L 139  137  138   Potassium  3.5 - 5.2 mmol/L 4.3  3.4  4.9   Chloride 96 - 106 mmol/L 104  103  102   CO2 20 - 29 mmol/L 20  22  21    Calcium 8.6 - 10.2 mg/dL 9.2  9.1  9.6   Total Protein 6.0 - 8.5 g/dL 7.1  6.8  7.4    Total Bilirubin 0.0 - 1.2 mg/dL 0.3  0.1  0.3   Alkaline Phos 44 - 121 IU/L 87  63  88   AST 0 - 40 IU/L 10  15  14    ALT 0 - 44 IU/L 9  9  14     Lipid Panel     Component Value Date/Time   CHOL 223 (H) 03/31/2022 1620   TRIG 142 03/31/2022 1620   HDL 60 03/31/2022 1620   CHOLHDL 3.7 03/31/2022 1620   CHOLHDL 4.2 05/02/2014 0000   VLDL 72 (H) 05/02/2014 0000   LDLCALC 138 (H) 03/31/2022 1620    CBC    Component Value Date/Time   WBC 3.3 (L) 01/07/2023 1424   WBC 5.6 05/01/2022 1611   RBC 4.40 01/07/2023 1424   RBC 4.30 05/01/2022 1611   HGB 13.5 01/07/2023 1424   HCT 38.2 01/07/2023 1424   PLT 244 01/07/2023 1424   MCV 87 01/07/2023 1424   MCH 30.7 01/07/2023 1424   MCH 30.2 05/01/2022 1611   MCHC 35.3 01/07/2023 1424   MCHC 34.9 05/01/2022 1611   RDW 13.4 01/07/2023 1424   LYMPHSABS 0.7 11/17/2021 1526   MONOABS 0.2 11/17/2021 1526   EOSABS 0.0 11/17/2021 1526   BASOSABS 0.0 11/17/2021 1526    ASSESSMENT AND PLAN: 1. Type 2 diabetes mellitus with other neurologic complication, without long-term current use of insulin (HCC) A1c slightly above goal.  We discussed increasing the dose of the Amaryl versus holding off until next visit as he tries to make changes in his eating habits.  Patient prefers the latter. - POCT glycosylated hemoglobin (Hb A1C) - POCT glucose (manual entry) - Microalbumin / creatinine urine ratio  2. Diabetes mellitus treated with oral medication (HCC) See #1 above  3. Hypertension associated with type 2 diabetes mellitus (HCC) Not at goal.  He is on Norvasc 10 mg daily.  Reports good readings at home.  We will hold off on adding another medication.  He will follow-up with the clinical pharmacist in 1 month for recheck.  Advised to bring blood pressure readings with him.  4. Hyperlipidemia associated with type 2 diabetes mellitus (HCC) Continue pravastatin.  5. Light smoker Strongly advised him to discontinue all tobacco products to  preserve his health.  6. Post-nasal drip - loratadine (CLARITIN) 10 MG tablet; Take 1 tablet (10 mg total) by mouth daily as needed for allergies.  Dispense: 60 tablet; Refill: 1 - fluticasone (FLONASE) 50 MCG/ACT nasal spray; Place 1 spray into both nostrils at bedtime x 1 week then use as needed  Dispense: 16 g; Refill: 1  7. Screening for colon cancer Patient reports he will be receiving another Cologuard kit in the mail tomorrow.  He will use it and send it back as soon as possible.  8. Encounter for immunization - Flu Vaccine Trivalent High Dose (Fluad)     Patient was given the opportunity to ask questions.  Patient verbalized understanding of the plan and was able to repeat key elements of the plan.   This documentation was completed using Paediatric nurse.  Any transcriptional errors are unintentional.  Orders  Placed This Encounter  Procedures   POCT glycosylated hemoglobin (Hb A1C)   POCT glucose (manual entry)     Requested Prescriptions   Pending Prescriptions Disp Refills   hydrOXYzine (VISTARIL) 25 MG capsule 60 capsule 1    Sig: Take 1 capsule (25 mg total) by mouth 2 (two) times daily as needed.    Return in about 4 months (around 08/06/2023) for Medicare Wellness Visit in  3  wks with CMA.  Jonah Blue, MD, FACP

## 2023-04-07 ENCOUNTER — Other Ambulatory Visit: Payer: Self-pay

## 2023-04-07 LAB — MICROALBUMIN / CREATININE URINE RATIO
Creatinine, Urine: 384.5 mg/dL
Microalb/Creat Ratio: 11 mg/g{creat} (ref 0–29)
Microalbumin, Urine: 42.7 ug/mL

## 2023-04-08 ENCOUNTER — Telehealth: Payer: Self-pay | Admitting: *Deleted

## 2023-04-08 NOTE — Telephone Encounter (Signed)
Patient called for lab results and notified:  No abnormal amounts of protein in the urine which is good.

## 2023-04-10 DIAGNOSIS — Z1211 Encounter for screening for malignant neoplasm of colon: Secondary | ICD-10-CM | POA: Diagnosis not present

## 2023-04-18 LAB — COLOGUARD: COLOGUARD: NEGATIVE

## 2023-04-19 ENCOUNTER — Telehealth: Payer: Self-pay

## 2023-04-19 NOTE — Telephone Encounter (Signed)
Pt given lab results per notes of Dr Laural Benes on 04/19/23 negative cologuard results. Pt verbalized understanding.

## 2023-04-23 ENCOUNTER — Telehealth (INDEPENDENT_AMBULATORY_CARE_PROVIDER_SITE_OTHER): Payer: Self-pay

## 2023-04-23 NOTE — Telephone Encounter (Signed)
Copied from CRM [id number]. Topic: Medicare AWV >> Apr 23, 2023  8:09 AM Everette C wrote: Reason for CRM: The patient has called to request a rescheduled AWV   The patient would like to be called but has no preference has to time of day   Please contact further when possible

## 2023-04-26 ENCOUNTER — Other Ambulatory Visit: Payer: Self-pay

## 2023-04-27 ENCOUNTER — Other Ambulatory Visit (HOSPITAL_COMMUNITY): Payer: Self-pay

## 2023-04-27 ENCOUNTER — Other Ambulatory Visit: Payer: Self-pay

## 2023-05-04 ENCOUNTER — Ambulatory Visit: Payer: Medicare HMO | Attending: Internal Medicine

## 2023-05-04 VITALS — Ht 73.0 in | Wt 191.0 lb

## 2023-05-04 DIAGNOSIS — Z0000 Encounter for general adult medical examination without abnormal findings: Secondary | ICD-10-CM | POA: Diagnosis not present

## 2023-05-04 NOTE — Patient Instructions (Addendum)
Mr. Ennen , Thank you for taking time to come for your Medicare Wellness Visit. I appreciate your ongoing commitment to your health goals. Please review the following plan we discussed and let me know if I can assist you in the future.   Referrals/Orders/Follow-Ups/Clinician Recommendations: No  This is a list of the screening recommended for you and due dates:  Health Maintenance  Topic Date Due   Complete foot exam   12/03/2022   COVID-19 Vaccine (4 - 2023-24 season) 02/21/2023   Zoster (Shingles) Vaccine (1 of 2) 11/09/2023*   Eye exam for diabetics  06/26/2023   Hemoglobin A1C  10/04/2023   Yearly kidney function blood test for diabetes  01/07/2024   Yearly kidney health urinalysis for diabetes  04/04/2024   Medicare Annual Wellness Visit  05/03/2024   Cologuard (Stool DNA test)  04/09/2026   DTaP/Tdap/Td vaccine (3 - Td or Tdap) 01/28/2030   Pneumonia Vaccine  Completed   Flu Shot  Completed   Hepatitis C Screening  Completed   HPV Vaccine  Aged Out  *Topic was postponed. The date shown is not the original due date.    Advanced directives: (Declined) Advance directive discussed with you today. Even though you declined this today, please call our office should you change your mind, and we can give you the proper paperwork for you to fill out.  Next Medicare Annual Wellness Visit scheduled for next year: Yes  *Preventive Care attachment *FALL PREVENTION attachment

## 2023-05-04 NOTE — Progress Notes (Signed)
Subjective:   [name] is a 72 y.o. male who presents for Medicare Annual/Subsequent preventive examination.  Visit Complete: Virtual I connected with  [name] on 05/04/23 by a audio enabled telemedicine application and verified that I am speaking with the correct person using two identifiers.  Patient Location: Home  Provider Location: Office/Clinic  I discussed the limitations of evaluation and management by telemedicine. The patient expressed understanding and agreed to proceed.  Vital Signs: Because this visit was a virtual/telehealth visit, some criteria may be missing or patient reported. Any vitals not documented were not able to be obtained and vitals that have been documented are patient reported.  Cardiac Risk Factors include: advanced age (>61men, >7 women);diabetes mellitus;dyslipidemia;hypertension;male gender;smoking/ tobacco exposure     Objective:    Today's Vitals   05/04/23 1005  Weight: 191 lb (86.6 kg)  Height: 6\' 1"  (1.854 m)  PainSc: 6   PainLoc: Back   Body mass index is 25.2 kg/m.     05/04/2023   10:11 AM 04/17/2022   11:22 AM 12/29/2021   10:35 AM 11/17/2021    3:16 PM 07/15/2021    2:59 PM 03/12/2021   11:10 PM 03/07/2021    8:32 AM  Advanced Directives  Does Patient Have a Medical Advance Directive? No Yes No No No No No  Type of Furniture conservator/restorer;Living will       Copy of Healthcare Power of Attorney in Chart?  No - copy requested       Would patient like information on creating a medical advance directive? No - Patient declined   No - Patient declined  No - Patient declined Yes (MAU/Ambulatory/Procedural Areas - Information given)    Current Medications (verified) Outpatient Encounter Medications as of 05/04/2023  Medication Sig   amLODipine (NORVASC) 10 MG tablet Take 1 tablet (10 mg total) by mouth daily.   apixaban (ELIQUIS) 5 MG TABS tablet Take 1 tablet (5 mg total) by mouth 2 (two)  times daily.   B Complex Vitamins (VITAMIN B COMPLEX) TABS Take 1 tablet by mouth daily.   diclofenac Sodium (VOLTAREN) 1 % GEL Apply 2 g topically 3 (three) times daily as needed (pain).   dicyclomine (BENTYL) 20 MG tablet Take 1 tablet (20 mg total) by mouth 4 (four) times daily as needed (intestinal cramps).   fluticasone (FLONASE) 50 MCG/ACT nasal spray Place 1 spray into both nostrils at bedtime x 1 week then use as needed   glimepiride (AMARYL) 1 MG tablet Take 1 tablet (1 mg total) by mouth daily with breakfast.   HYDROcodone-acetaminophen (NORCO) 10-325 MG tablet Take 1 tablet by mouth every 4 (four) hours as needed.   hydrOXYzine (VISTARIL) 25 MG capsule Take 1 capsule (25 mg total) by mouth 2 (two) times daily as needed.   latanoprost (XALATAN) 0.005 % ophthalmic solution Place 1 drop into both eyes nightly.   latanoprost (XALATAN) 0.005 % ophthalmic solution Place 1 drop into both eyes nightly.   loratadine (CLARITIN) 10 MG tablet Take 1 tablet (10 mg total) by mouth daily as needed for allergies.   Multiple Vitamin (MULTIVITAMIN WITH MINERALS) TABS tablet Take 1 tablet by mouth daily.   omeprazole (PRILOSEC) 20 MG capsule Take 1 capsule (20 mg total) by mouth daily.   pravastatin (PRAVACHOL) 20 MG tablet Take 1 tablet (20 mg total) by mouth daily.   tamsulosin (FLOMAX) 0.4 MG CAPS capsule Take 1 capsule (0.4 mg total) by mouth daily  after supper.   No facility-administered encounter medications on file as of 05/04/2023.    Allergies (verified) Lisinopril and Tramadol hcl   History: Past Medical History:  Diagnosis Date   Abscess of anal and rectal regions 07/15/2007   Qualifier: Diagnosis of  By: Delrae Alfred MD, Elizabeth     Atrial fibrillation [facility])    Diabetes mellitus without complication (HCC) 2008   type 2   FISTULA, ANAL 01/27/2007   Qualifier: Diagnosis of  By: Delrae Alfred MD, Elizabeth     GERD (gastroesophageal reflux disease)    Hypertension Dx 2008   Primary open  angle glaucoma (POAG) of both eyes    Mild 06/25/22 Dr. Nile Riggs   Prostate cancer [facility])    Spontaneous pneumothorax    Stroke [facility])    Past Surgical History:  Procedure Laterality Date   CATARACT EXTRACTION  06/2017   CYSTOSCOPY  05/2017   INTERCOSTAL NERVE BLOCK Left 05/13/2020   Procedure: INTERCOSTAL NERVE BLOCK;  Surgeon: Corliss Skains, MD;  Location: [facility] OR;  Service: Thoracic;  Laterality: Left;   LUMBAR LAMINECTOMY/DECOMPRESSION MICRODISCECTOMY Right 03/11/2021   Procedure: Microdiscectomy - right - Lumbar four-Lumbar five extraforaminal;  Surgeon: Julio Sicks, MD;  Location: [facility] OR;  Service: Neurosurgery;  Laterality: Right;   LUMBAR LAMINECTOMY/DECOMPRESSION MICRODISCECTOMY Left 12/29/2021   Procedure: Extraforaminal microdiscectomy - left - L4-L5;  Surgeon: Julio Sicks, MD;  Location: [facility] OR;  Service: Neurosurgery;  Laterality: Left;  3C   PROSTATE BIOPSY     WEDGE RESECTION Left 05/13/2020   XI robotic assisted thorascopy   Family History  Problem Relation Age of Onset   Diabetes Mother    Breast cancer Mother    Diabetes Father    Breast cancer Sister    Prostate cancer Neg Hx    Colon cancer Neg Hx    Pancreatic cancer Neg Hx    Social History   Socioeconomic History   Marital status: Married    Spouse name: Not on file   Number of children: 5   Years of education: Not on file   Highest education level: Not on file  Occupational History   Occupation: Caregiver    Comment: retired  Tobacco Use   Smoking status: Some Days    Current packs/day: 0.25    Average packs/day: 0.3 packs/day for 10.0 years (2.5 ttl pk-yrs)    Types: Pipe, Cigarettes   Smokeless tobacco: Never   Tobacco comments:    Quit 05/15/14  Vaping Use   Vaping status: Never Used  Substance and Sexual Activity   Alcohol use: Yes    Alcohol/week: 1.0 - 2.0 standard drink of alcohol    Types: 1 - 2 Standard drinks or equivalent per week    Comment: occasional wine   Drug use: No    Sexual activity: Not Currently  Other Topics Concern   Not on file  Social History Narrative   Lives with his wife   5 children, grown.   7 grandchildren.    Social Determinants of Health   Financial Resource Strain: Medium Risk (05/04/2023)   Overall Financial Resource Strain (CARDIA)    Difficulty of Paying Living Expenses: Somewhat hard  Food Insecurity: Food Insecurity Present (05/04/2023)   Hunger Vital Sign    Worried About Running Out of Food in the Last Year: Sometimes true    Ran Out of Food in the Last Year: Sometimes true  Transportation Needs: No Transportation Needs (05/04/2023)   PRAPARE - Transportation    Lack of  Transportation (Medical): No    Lack of Transportation (Non-Medical): No  Recent Concern: Transportation Needs - Unmet Transportation Needs (04/05/2023)   PRAPARE - Transportation    Lack of Transportation (Medical): Yes    Lack of Transportation (Non-Medical): Yes  Physical Activity: Insufficiently Active (05/04/2023)   Exercise Vital Sign    Days of Exercise per Week: 3 days    Minutes of Exercise per Session: 30 min  Stress: No Stress Concern Present (05/04/2023)   [employer] of Occupational Health - Occupational Stress Questionnaire    Feeling of Stress : Not at all  Social Connections: Moderately Integrated (05/04/2023)   Social Connection and Isolation Panel [NHANES]    Frequency of Communication with Friends and Family: Three times a week    Frequency of Social Gatherings with Friends and Family: Never    Attends Religious Services: More than 4 times per year    Active Member of Clubs or Organizations: Yes    Attends Engineer, structural: More than 4 times per year    Marital Status: Separated    Tobacco Counseling Ready to quit: Not Answered Counseling given: Not Answered Tobacco comments: Quit 05/15/14   Clinical Intake:  Pre-visit preparation completed: Yes  Pain : 0-10 Pain Score: 6  (constant) Pain Type:  Chronic pain Pain Location: Back Pain Orientation: Lower Pain Descriptors / Indicators: Constant     BMI - recorded: 25.2 Nutritional Risks: None Diabetes: Yes CBG done?: No Did pt. bring in CBG monitor from home?: No  How often do you need to have someone help you when you read instructions, pamphlets, or other written materials from your doctor or pharmacy?: 1 - Never What is the last grade level you completed in school?: 2 years of college  Interpreter Needed?: No  Information entered by :: Susie Cassette, LPN.   Activities of Daily Living    05/04/2023   10:17 AM  In your present state of health, do you have any difficulty performing the following activities:  Hearing? 0  Vision? 0  Difficulty concentrating or making decisions? 1  Walking or climbing stairs? 1  Dressing or bathing? 0  Doing errands, shopping? 0  Preparing Food and eating ? N  Using the Toilet? N  In the past six months, have you accidently leaked urine? N  Do you have problems with loss of bowel control? N  Managing your Medications? N  Managing your Finances? N  Housekeeping or managing your Housekeeping? N    Patient Care Team: Marcine Matar, MD as PCP - General (Internal Medicine) Felicita Gage, RN Nurse Navigator as Registered Nurse (Medical Oncology) [facility] as Consulting Physician (Ophthalmology) Helane Gunther, DPM as Consulting Physician (Podiatry)  Indicate any recent Medical Services you may have received from other than [facility] providers in the past year (date may be approximate).     Assessment:   This is a routine wellness examination for Harun.  Hearing/Vision screen Hearing Screening - Comments:: Denies hearing difficulties.  No hearing aids.  Vision Screening - Comments:: Wears rx glasses - up to date with routine eye exams with Jethro Bolus, MD. Patient is scheduled to see [facility] in January 2025.    Goals Addressed              This Visit's Progress    My goal for 2025 is to continue to watch my diet because I want my diabetes and blood pressure to stay under control, stay physically and socially  active.        Depression Screen    05/04/2023   10:16 AM 04/05/2023    2:30 PM 09/30/2022    5:47 PM 07/10/2022    4:42 PM 04/17/2022   11:18 AM 12/26/2021    2:46 PM 11/20/2021    3:45 PM  PHQ 2/9 Scores  PHQ - 2 Score 0 0 0 0 0 0 0  PHQ- 9 Score 2 2  0  0     Fall Risk    05/04/2023   10:15 AM 11/09/2022    1:42 PM 06/23/2022   11:12 AM 04/17/2022   11:20 AM 03/31/2022    3:26 PM  Fall Risk   Falls in the past year? 1 1 1 1 1   Number falls in past yr: 1 1 1  0 1  Injury with Fall? 0 0 0 0 0  Risk for fall due to :  Impaired mobility;Impaired balance/gait  No Fall Risks Impaired balance/gait;History of fall(s)  Follow up Falls evaluation completed;Education provided Falls evaluation completed Falls evaluation completed Falls prevention discussed Falls evaluation completed    MEDICARE RISK AT HOME: Medicare Risk at Home Any stairs in or around the home?: No If so, are there any without handrails?: No Home free of loose throw rugs in walkways, pet beds, electrical cords, etc?: Yes Adequate lighting in your home to reduce risk of falls?: Yes Life alert?: No Use of a cane, walker or w/c?: Yes Grab bars in the bathroom?: Yes Shower chair or bench in shower?: Yes Elevated toilet seat or a handicapped toilet?: Yes  TIMED UP AND GO:  Was the test performed?  No    Cognitive Function:    05/04/2023   10:16 AM 11/29/2020    3:52 PM 11/13/2019    3:40 PM  MMSE - Mini Mental State Exam  Not completed: Unable to complete    Orientation to time  5 5  Orientation to Place  5 5  Registration  3 3  Attention/ Calculation  5 5  Recall  3 3  Language- name 2 objects  2 2  Language- repeat  1 1  Language- follow 3 step command  3 3  Language- read & follow direction  1 1  Write a sentence  1 1  Copy design  0  1  Total score  29 30        05/04/2023   10:16 AM 04/17/2022   11:22 AM  6CIT Screen  What Year? 0 points 0 points  What month? 0 points 0 points  What time? 0 points 0 points  Count back from 20 0 points 0 points  Months in reverse 0 points 0 points  Repeat phrase 0 points 0 points  Total Score 0 points 0 points    Immunizations Immunization History  Administered Date(s) Administered   Fluad Trivalent(High Dose 65+) 04/05/2023   Influenza Whole 03/27/2009, 07/17/2010   Influenza,inj,Quad PF,6+ Mos 06/09/2018, 03/01/2019, 04/25/2020, 04/22/2021, 03/31/2022   PFIZER(Purple Top)SARS-COV-2 Vaccination 07/29/2019, 08/21/2019, 04/08/2020   Pneumococcal Conjugate-13 03/23/2017   Pneumococcal Polysaccharide-23 07/17/2010, 06/09/2018   Td 06/22/2005   Tdap 01/29/2020    TDAP status: Up to date  Flu Vaccine status: Up to date  Pneumococcal vaccine status: Up to date  Covid-19 vaccine status: Completed vaccines  Qualifies for Shingles Vaccine? Yes   Zostavax completed No   Shingrix Completed?: No.    Education has been provided regarding the importance of this vaccine. Patient has been advised to  call insurance company to determine out of pocket expense if they have not yet received this vaccine. Advised may also receive vaccine at local pharmacy or Health Dept. Verbalized acceptance and understanding.  Screening Tests Health Maintenance  Topic Date Due   FOOT EXAM  12/03/2022   COVID-19 Vaccine (4 - 2023-24 season) 02/21/2023   Zoster Vaccines- Shingrix (1 of 2) 11/09/2023 (Originally 01/23/1970)   OPHTHALMOLOGY EXAM  06/26/2023   HEMOGLOBIN A1C  10/04/2023   Diabetic kidney evaluation - eGFR measurement  01/07/2024   Diabetic kidney evaluation - Urine ACR  04/04/2024   Medicare Annual Wellness (AWV)  05/03/2024   Fecal DNA (Cologuard)  04/09/2026   DTaP/Tdap/Td (3 - Td or Tdap) 01/28/2030   Pneumonia Vaccine 45+ Years old  Completed   INFLUENZA VACCINE  Completed    Hepatitis C Screening  Completed   HPV VACCINES  Aged Out    Health Maintenance  Health Maintenance Due  Topic Date Due   FOOT EXAM  12/03/2022   COVID-19 Vaccine (4 - 2023-24 season) 02/21/2023    Colorectal cancer screening: Type of screening: Cologuard. Completed 04/10/2023. Repeat every 3 years  Lung Cancer Screening: (Low Dose CT Chest recommended if Age 33-80 years, 20 pack-year currently smoking OR have quit w/in 15years.) does not qualify.   Lung Cancer Screening Referral: no  Additional Screening:  Hepatitis C Screening: does qualify; Completed 01/29/2020  Vision Screening: Recommended annual ophthalmology exams for early detection of glaucoma and other disorders of the eye. Is the patient up to date with their annual eye exam?  Yes  Who is the provider or what is the name of the office in which the patient attends annual eye exams? Jethro Bolus, MD.  Patient is scheduled to see [facility] 06/2023 If pt is not established with a provider, would they like to be referred to a provider to establish care? No .   Dental Screening: Recommended annual dental exams for proper oral hygiene  Diabetic Foot Exam: Diabetic Foot Exam: Completed 03/15/2023 with [facility].  Community Resource Referral / Chronic Care Management: CRR required this visit?  No   CCM required this visit?  No     Plan:     I have personally reviewed and noted the following in the patient's chart:   Medical and social history Use of alcohol, tobacco or illicit drugs  Current medications and supplements including opioid prescriptions. Patient is currently taking opioid prescriptions. Information provided to patient regarding non-opioid alternatives. Patient advised to discuss non-opioid treatment plan with their provider. Functional ability and status Nutritional status Physical activity Advanced directives List of other physicians Hospitalizations, surgeries, and ER visits in previous 12  months Vitals Screenings to include cognitive, depression, and falls Referrals and appointments  In addition, I have reviewed and discussed with patient certain preventive protocols, quality metrics, and best practice recommendations. A written personalized care plan for preventive services as well as general preventive health recommendations were provided to patient.     Mickeal Needy, LPN   29/56/2130   After Visit Summary: (Mail) Due to this being a telephonic visit, the after visit summary with patients personalized plan was offered to patient via mail   Nurse Notes: None

## 2023-05-10 ENCOUNTER — Other Ambulatory Visit: Payer: Self-pay | Admitting: Internal Medicine

## 2023-05-10 DIAGNOSIS — F419 Anxiety disorder, unspecified: Secondary | ICD-10-CM

## 2023-05-11 ENCOUNTER — Other Ambulatory Visit: Payer: Self-pay

## 2023-05-11 MED ORDER — HYDROXYZINE PAMOATE 25 MG PO CAPS
25.0000 mg | ORAL_CAPSULE | Freq: Two times a day (BID) | ORAL | 1 refills | Status: DC | PRN
  Filled 2023-05-11: qty 60, 30d supply, fill #0
  Filled 2023-07-10: qty 60, 30d supply, fill #1

## 2023-05-11 NOTE — Telephone Encounter (Signed)
Lab in date  Requested Prescriptions  Pending Prescriptions Disp Refills   hydrOXYzine (VISTARIL) 25 MG capsule 60 capsule 1    Sig: Take 1 capsule (25 mg total) by mouth 2 (two) times daily as needed.     Ear, Nose, and Throat:  Antihistamines 2 Failed - 05/10/2023  2:45 AM      Failed - Cr in normal range and within 360 days    Creatinine, Ser  Date Value Ref Range Status  01/07/2023 1.28 (H) 0.76 - 1.27 mg/dL Final   Creatinine, POC  Date Value Ref Range Status  12/04/2016 50 mg/dL Final   Creatinine, Urine  Date Value Ref Range Status  10/16/2014 565.1 mg/dL Final    Comment:    Result repeated and verified. Result confirmed by automatic dilution. No reference range established.          Passed - Valid encounter within last 12 months    Recent Outpatient Visits           1 month ago Type 2 diabetes mellitus with other neurologic complication, without long-term current use of insulin (HCC)   [facility] - A Dept Of [place]. [facility] Marcine Matar, MD   3 months ago History of prostate cancer   [facility] - A Dept Of [place]. [facility] Marcine Matar, MD   4 months ago Essential hypertension   [facility] - A Dept Of [place]. [facility] Lois Huxley, [place] L, RPH-CPP   6 months ago Type 2 diabetes mellitus with other neurologic complication, without long-term current use of insulin (HCC)   [facility] - [facility] Dept Of [place]. [facility] Marcine Matar, MD   7 months ago Medication management   [facility] - A Dept Of [place]. [facility] Drucilla Chalet, RPH-CPP       Future Appointments             In 2 days Lois Huxley, Cornelius Moras, RPH-CPP [facility] - A Dept Of [place]. [facility]

## 2023-05-12 ENCOUNTER — Other Ambulatory Visit: Payer: Self-pay

## 2023-05-12 NOTE — Progress Notes (Unsigned)
   S:     No chief complaint on file.  72 y.o. male who presents for hypertension evaluation, education, and management.  PMH is significant for ***.  Patient was referred and last seen by Primary Care Provider, Dr. ***, on ***.  *** Patient was referred by *** on ***. Patient was last seen by Primary Care Provider, Dr. ***, on ***.   At last visit, ***.   Today, patient arrives in *** spirits and presents without *** assistance. *** Denies dizziness, headache, blurred vision, swelling.   Patient reports hypertension was diagnosed in ***.   Family/Social history: ***  Medication adherence *** . Patient has *** taken BP medications today.   Current antihypertensives include: ***  Antihypertensives tried in the past include: ***  Reported home BP readings: ***  Patient reported dietary habits: Eats *** meals/day Breakfast: *** Lunch: *** Dinner: *** Snacks: *** Drinks: ***  Patient-reported exercise habits: ***  ASCVD risk factors include: ***  O:    Last 3 Office BP readings: BP Readings from Last 3 Encounters:  04/05/23 137/83  01/07/23 132/67  11/09/22 (!) 150/81    BMET    Component Value Date/Time   NA 139 01/07/2023 1424   K 4.3 01/07/2023 1424   CL 104 01/07/2023 1424   CO2 20 01/07/2023 1424   GLUCOSE 119 (H) 01/07/2023 1424   GLUCOSE 144 (H) 05/01/2022 1611   BUN 22 01/07/2023 1424   CREATININE 1.28 (H) 01/07/2023 1424   CALCIUM 9.2 01/07/2023 1424   GFRNONAA >60 05/01/2022 1611   GFRAA 74 05/27/2020 1338    Renal function: CrCl cannot be calculated (Patient's most recent lab result is older than the maximum 21 days allowed.).  Clinical ASCVD: {YES/NO:21197} The ASCVD Risk score (Arnett DK, et al., 2019) failed to calculate for the following reasons:   The patient has a prior MI or stroke diagnosis  Patient is participating in a Managed Medicaid Plan:  {MM YES/NO:27447::"Yes"}    A/P: Hypertension diagnosed *** currently *** on current  medications. BP goal < 130/80 *** mmHg. Medication adherence appears ***. Control is suboptimal due to ***.  -{Meds adjust:18428} ***.  -{Meds adjust:18428} ***.  -Patient educated on purpose, proper use, and potential adverse effects of ***.  -F/u labs ordered - *** -Counseled on lifestyle modifications for blood pressure control including reduced dietary sodium, increased exercise, adequate sleep. -Encouraged patient to check BP at home and bring log of readings to next visit. Counseled on proper use of home BP cuff.   Results reviewed and written information provided.    Written patient instructions provided. Patient verbalized understanding of treatment plan.  Total time in face to face counseling *** minutes.    Follow-up:  Pharmacist ***. PCP clinic visit in ***.  Patient seen with ***

## 2023-05-13 ENCOUNTER — Encounter: Payer: Self-pay | Admitting: Pharmacist

## 2023-05-13 ENCOUNTER — Ambulatory Visit: Payer: Medicare HMO | Attending: Internal Medicine | Admitting: Pharmacist

## 2023-05-13 VITALS — BP 158/95 | HR 81

## 2023-05-13 DIAGNOSIS — E785 Hyperlipidemia, unspecified: Secondary | ICD-10-CM

## 2023-05-13 DIAGNOSIS — E1159 Type 2 diabetes mellitus with other circulatory complications: Secondary | ICD-10-CM | POA: Diagnosis not present

## 2023-05-13 DIAGNOSIS — E1169 Type 2 diabetes mellitus with other specified complication: Secondary | ICD-10-CM | POA: Diagnosis not present

## 2023-05-13 DIAGNOSIS — I152 Hypertension secondary to endocrine disorders: Secondary | ICD-10-CM | POA: Diagnosis not present

## 2023-05-14 ENCOUNTER — Other Ambulatory Visit: Payer: Self-pay

## 2023-05-14 ENCOUNTER — Telehealth: Payer: Self-pay | Admitting: Internal Medicine

## 2023-05-14 LAB — LIPID PANEL
Chol/HDL Ratio: 4.3 ratio (ref 0.0–5.0)
Cholesterol, Total: 201 mg/dL — ABNORMAL HIGH (ref 100–199)
HDL: 47 mg/dL (ref >39)
LDL Chol Calc (NIH): 118 mg/dL — ABNORMAL HIGH (ref 0–99)
Triglycerides: 208 mg/dL — ABNORMAL HIGH (ref 0–149)
VLDL Cholesterol Cal: 36 mg/dL (ref 5–40)

## 2023-05-14 NOTE — Telephone Encounter (Signed)
Copied from CRM [id number]. Topic: General - Call Back - No Documentation >> May 14, 2023  8:44 AM Payton Doughty wrote: Reason for CRM: pt would like to know the results of his recent lab.  The dr has not looked at it yet >> May 14, 2023  9:31 AM Payton Doughty wrote: Pt would like Franky Macho to call him as well.  He wants to make sure all is well before he goes out of town.

## 2023-05-14 NOTE — Telephone Encounter (Signed)
Noted. Awaiting review and results from Dr.Johnson.

## 2023-05-14 NOTE — Telephone Encounter (Signed)
Labs have not been view by provider once they have been ,patient will be notified of the providers recommendations

## 2023-05-14 NOTE — Telephone Encounter (Signed)
Patient has called in regards to his Lab Results that he received on MyChart and he thinks his results are high and is requesting a call back about this and is also requesting Dr Laural Benes to go ahead and prescribe whatever medication he needs to be on to bring these Lab results down.   Please advise and follow back up with the patient at phone # [phone]

## 2023-05-15 ENCOUNTER — Encounter: Payer: Self-pay | Admitting: Internal Medicine

## 2023-05-18 ENCOUNTER — Other Ambulatory Visit: Payer: Self-pay

## 2023-05-18 ENCOUNTER — Telehealth: Payer: Self-pay | Admitting: Internal Medicine

## 2023-05-18 ENCOUNTER — Other Ambulatory Visit (HOSPITAL_COMMUNITY): Payer: Self-pay

## 2023-05-18 MED ORDER — ROSUVASTATIN CALCIUM 5 MG PO TABS
5.0000 mg | ORAL_TABLET | Freq: Every day | ORAL | 1 refills | Status: DC
  Filled 2023-05-18: qty 90, 90d supply, fill #0
  Filled 2023-09-27 – 2023-10-11 (×2): qty 90, 90d supply, fill #1

## 2023-05-18 NOTE — Telephone Encounter (Signed)
:   Spoke to [name] regarding his cholesterol.  I had sent him a MyChart message informing that his cholesterol is not at goal.  I recommended increasing the pravastatin from 20 mg daily to 40 mg daily.  However patient stated that the pravastatin causes diarrhea for him.  He would like to be tried with a different cholesterol medication.  I told him to stop the pravastatin and we will change to 1 call rosuvastatin.  He is agreeable to trying this.  Prescription sent to our pharmacy.

## 2023-05-18 NOTE — Telephone Encounter (Signed)
Pt called back saying he wants to change the medication and would like a nurse to call him back for more information.  CB@  [phone]

## 2023-05-19 ENCOUNTER — Other Ambulatory Visit: Payer: Self-pay

## 2023-05-27 ENCOUNTER — Other Ambulatory Visit: Payer: Self-pay | Admitting: Internal Medicine

## 2023-05-27 DIAGNOSIS — J3489 Other specified disorders of nose and nasal sinuses: Secondary | ICD-10-CM

## 2023-05-27 DIAGNOSIS — R0982 Postnasal drip: Secondary | ICD-10-CM

## 2023-06-14 ENCOUNTER — Ambulatory Visit: Payer: Medicare HMO | Admitting: Podiatry

## 2023-06-17 ENCOUNTER — Ambulatory Visit: Payer: Medicare HMO | Admitting: Podiatry

## 2023-06-17 ENCOUNTER — Encounter: Payer: Self-pay | Admitting: Podiatry

## 2023-06-17 DIAGNOSIS — M216X2 Other acquired deformities of left foot: Secondary | ICD-10-CM

## 2023-06-17 DIAGNOSIS — B351 Tinea unguium: Secondary | ICD-10-CM

## 2023-06-17 DIAGNOSIS — M79674 Pain in right toe(s): Secondary | ICD-10-CM

## 2023-06-17 DIAGNOSIS — M79675 Pain in left toe(s): Secondary | ICD-10-CM | POA: Diagnosis not present

## 2023-06-17 DIAGNOSIS — M216X1 Other acquired deformities of right foot: Secondary | ICD-10-CM

## 2023-06-17 DIAGNOSIS — E1149 Type 2 diabetes mellitus with other diabetic neurological complication: Secondary | ICD-10-CM

## 2023-06-17 NOTE — Progress Notes (Signed)
This patient returns to my office for at risk foot care.  This patient requires this care by a professional since this patient will be at risk due to having diabetes.  This patient is unable to cut nails himself since the patient cannot reach his nails.These nails are painful walking and wearing shoes.  This patient presents for at risk foot care today.  General Appearance  Alert, conversant and in no acute stress.  Vascular  Dorsalis pedis and posterior tibial  pulses are palpable  bilaterally.  Capillary return is within normal limits  bilaterally. Temperature is within normal limits  bilaterally.  Neurologic  Senn-Weinstein monofilament wire test diminished bilaterally. Muscle power within normal limits bilaterally.  Nails Thick disfigured discolored nails with subungual debris  from hallux to fifth toes bilaterally. No evidence of bacterial infection or drainage bilaterally.  Orthopedic  No limitations of motion  feet .  No crepitus or effusions noted.  No bony pathology or digital deformities noted.  Plantar flexed fifth metatarsal  B/L.  Skin  normotropic skin with no porokeratosis noted bilaterally.  No signs of infections or ulcers noted.   Asymptomatic callus sub 5th  B/l.  Onychomycosis  Pain in right toes  Pain in left toes  Pes planus    Consent was obtained for treatment procedures.   Mechanical debridement of nails 1-5  bilaterally performed with a nail nipper.  Filed with dremel without incident.    Return office visit   3 months                  Told patient to return for periodic foot care and evaluation due to potential at risk complications.   Helane Gunther DPM

## 2023-07-01 ENCOUNTER — Encounter: Payer: Self-pay | Admitting: Internal Medicine

## 2023-07-01 ENCOUNTER — Ambulatory Visit: Payer: Self-pay | Admitting: *Deleted

## 2023-07-01 ENCOUNTER — Ambulatory Visit: Payer: Self-pay

## 2023-07-01 NOTE — Telephone Encounter (Signed)
 Patient scheduled urgent care visit for 07/02/2023. Patient is aware and agreeable to go.

## 2023-07-01 NOTE — Telephone Encounter (Signed)
 Chief Complaint: Cough Symptoms: Productive cough, hiccups, vomiting, post nasal drip, tan sputum, fever, Mild to moderate SOB Frequency: x3 days constant  Pertinent Negatives: Patient denies chest pain,  Disposition: [] ED /[] Urgent Care (no appt availability in office) / [] Appointment(In office/virtual)/ []  [facility]/ [] Home Care/ [] Refused Recommended Disposition /[] [facility]/ []  Follow-up with PCP Additional Notes: Patient states he has been dealing with post nasal drip for about 1 month. Patient reports he developed a cough and hiccups about 3 days ago. The symptoms are so bad it is making him vomiting from coughing and SOB from the hiccups. Patient stated PCP was suppose to place a referral for him but he does not know what happened. Care advice given and patient was offered an urgent care appointment at multiple locations and patient declined stating he just doesn't feel good. Patient would like recommendations from PCP.   Reason for Disposition  [1] Continuous (nonstop) coughing interferes with work or school AND [2] no improvement using cough treatment per Care Advice  Answer Assessment - Initial Assessment Questions 1. ONSET: When did the cough begin?      2 days ago  2. SEVERITY: How bad is the cough today?      Moderate to Severe 3. SPUTUM: Describe the color of your sputum (none, dry cough; clear, white, yellow, green)     Brownish tan 4. HEMOPTYSIS: Are you coughing up any blood? If so ask: How much? (flecks, streaks, tablespoons, etc.)     No 5. DIFFICULTY BREATHING: Are you having difficulty breathing? If Yes, ask: How bad is it? (e.g., mild, moderate, severe)    - MILD: No SOB at rest, mild SOB with walking, speaks normally in sentences, can lie down, no retractions, pulse < 100.    - MODERATE: SOB at rest, SOB with minimal exertion and prefers to sit, cannot lie down flat, speaks in phrases, mild retractions, audible wheezing, pulse  100-120.    - SEVERE: Very SOB at rest, speaks in single words, struggling to breathe, sitting hunched forward, retractions, pulse > 120      Moderate  6. FEVER: Do you have a fever? If Yes, ask: What is your temperature, how was it measured, and when did it start?     101.0 7. CARDIAC HISTORY: Do you have any history of heart disease? (e.g., heart attack, congestive heart failure)      A-fib  8. LUNG HISTORY: Do you have any history of lung disease?  (e.g., pulmonary embolus, asthma, emphysema)     No  9. PE RISK FACTORS: Do you have a history of blood clots? (or: recent major surgery, recent prolonged travel, bedridden)     No  10. OTHER SYMPTOMS: Do you have any other symptoms? (e.g., runny nose, wheezing, chest pain)       Hiccups, vomiting, congestion, post-nasal drip runny nose  Protocols used: Cough - Acute Productive-A-AH

## 2023-07-01 NOTE — Telephone Encounter (Signed)
 Message from [place] M sent at 07/01/2023  1:02 PM EST  Summary: Pt asked if a Rx for Chlorpromazine  could be sent to his pharmacy   Pt stated that he has hiccups and he was advised that Dr. Vicci does not have any available appt. Pt asked if a Rx for Chlorpromazine  could be sent to his pharmacy. Cb# [id number]          Call History  Contact Date/Time Type Contact Phone/Fax By  07/01/2023 12:59 PM EST Phone (Incoming) Travis, Scott C (Self) [phone] MIKE) [place], [place]

## 2023-07-01 NOTE — Telephone Encounter (Signed)
 Reason for Disposition  [1] Hiccups present > 24 hours AND [2] nearly continuous  Answer Assessment - Initial Assessment Questions 1. ONSET: When did the hiccups begin?      Pt was triaged earlier today for hiccups, coughing, congestion.   I returned his call.   He said he has the hiccups and wanted to know if Dr. Vicci could call him in something like Chlorpromazine  for them.   I let him know she would need to see him first. They already told me she don't have any appts today.   Per triage notes earlier today he refused virtual and urgent care options.  2. SEVERITY: How bad are the hiccups now?  (Severe: unable to eat, drink or sleep because of hiccups)     I had them along with this cold I have. 3. TREATMENT: What have you done so far to treat the hiccups?     Nothing 4. RECURRENT SYMPTOM: Have you ever had severe hiccups before? If Yes, ask: When was the last time? and What happened that time?      Not asked He said never mind and hung up. 5. OTHER SYMPTOMS: Do you have any other symptoms? (e.g., chest pain, difficulty breathing,  abdomen pain, vomiting)     Per triage notes from earlier today he is having congestion, coughing.    See those notes.  6. PREGNANCY: Is there any chance you are pregnant? When was your last menstrual period?     N/A  Protocols used: Hiccups-A-AH  Chief Complaint:  (Pt triaged for this earlier today.   See those triage notes).  Hiccups.    Wants Dr. Vicci to call in medicine for them. Symptoms: Hiccups along with nasal congestion, coughing.   Frequency: Since having this cold Pertinent Negatives: Patient denies virtual appt and urgent care options.    Disposition: [] ED /[x] Urgent Care (no appt availability in office) / [] Appointment(In office/virtual)/ []  [facility]/ [] Home Care/ [] Refused Recommended Disposition /[] [facility]/ []  Follow-up with PCP Additional Notes: See triage notes from prior call earlier  today where he was triaged for this.    There are no appts at [facility].   When I told him he would need to be seen in order for an rx to be ordered he said,   Never mind, I already know there aren't any appts there and hung up.  From prior call he refused virtual care options and urgent care options presented to him by triage nurse.

## 2023-07-02 ENCOUNTER — Other Ambulatory Visit: Payer: Self-pay

## 2023-07-02 ENCOUNTER — Emergency Department (HOSPITAL_COMMUNITY)
Admission: EM | Admit: 2023-07-02 | Discharge: 2023-07-02 | Disposition: A | Discharge status: Home / Self Care | Payer: Medicare HMO | Attending: Emergency Medicine | Admitting: Emergency Medicine

## 2023-07-02 ENCOUNTER — Ambulatory Visit (HOSPITAL_COMMUNITY)
Admission: RE | Admit: 2023-07-02 | Discharge: 2023-07-02 | Disposition: A | Discharge status: Home / Self Care | Payer: Medicare HMO | Source: Ambulatory Visit | Attending: Internal Medicine | Admitting: Internal Medicine

## 2023-07-02 ENCOUNTER — Other Ambulatory Visit (HOSPITAL_COMMUNITY): Payer: Self-pay

## 2023-07-02 ENCOUNTER — Emergency Department (HOSPITAL_COMMUNITY): Payer: Medicare HMO

## 2023-07-02 ENCOUNTER — Encounter (HOSPITAL_COMMUNITY): Payer: Self-pay

## 2023-07-02 VITALS — BP 149/81 | HR 110 | Temp 98.3°F | Resp 18

## 2023-07-02 DIAGNOSIS — E119 Type 2 diabetes mellitus without complications: Secondary | ICD-10-CM | POA: Diagnosis not present

## 2023-07-02 DIAGNOSIS — Z7901 Long term (current) use of anticoagulants: Secondary | ICD-10-CM | POA: Insufficient documentation

## 2023-07-02 DIAGNOSIS — R7989 Other specified abnormal findings of blood chemistry: Secondary | ICD-10-CM | POA: Diagnosis not present

## 2023-07-02 DIAGNOSIS — R066 Hiccough: Secondary | ICD-10-CM | POA: Diagnosis not present

## 2023-07-02 DIAGNOSIS — J329 Chronic sinusitis, unspecified: Secondary | ICD-10-CM | POA: Insufficient documentation

## 2023-07-02 DIAGNOSIS — Z7984 Long term (current) use of oral hypoglycemic drugs: Secondary | ICD-10-CM | POA: Insufficient documentation

## 2023-07-02 DIAGNOSIS — R0602 Shortness of breath: Secondary | ICD-10-CM | POA: Diagnosis not present

## 2023-07-02 DIAGNOSIS — Z8546 Personal history of malignant neoplasm of prostate: Secondary | ICD-10-CM | POA: Insufficient documentation

## 2023-07-02 DIAGNOSIS — Z20822 Contact with and (suspected) exposure to covid-19: Secondary | ICD-10-CM | POA: Insufficient documentation

## 2023-07-02 DIAGNOSIS — I2510 Atherosclerotic heart disease of native coronary artery without angina pectoris: Secondary | ICD-10-CM | POA: Diagnosis not present

## 2023-07-02 DIAGNOSIS — J988 Other specified respiratory disorders: Secondary | ICD-10-CM

## 2023-07-02 DIAGNOSIS — Z79899 Other long term (current) drug therapy: Secondary | ICD-10-CM | POA: Diagnosis not present

## 2023-07-02 DIAGNOSIS — I10 Essential (primary) hypertension: Secondary | ICD-10-CM | POA: Diagnosis not present

## 2023-07-02 DIAGNOSIS — R059 Cough, unspecified: Secondary | ICD-10-CM | POA: Diagnosis not present

## 2023-07-02 DIAGNOSIS — E041 Nontoxic single thyroid nodule: Secondary | ICD-10-CM | POA: Diagnosis not present

## 2023-07-02 LAB — COMPREHENSIVE METABOLIC PANEL
ALT: 12 U/L (ref 0–44)
AST: 15 U/L (ref 15–41)
Albumin: 3.7 g/dL (ref 3.5–5.0)
Alkaline Phosphatase: 78 U/L (ref 38–126)
Anion gap: 11 (ref 5–15)
BUN: 12 mg/dL (ref 8–23)
CO2: 22 mmol/L (ref 22–32)
Calcium: 8.9 mg/dL (ref 8.9–10.3)
Chloride: 105 mmol/L (ref 98–111)
Creatinine, Ser: 1.14 mg/dL (ref 0.61–1.24)
GFR, Estimated: >60 mL/min (ref >60)
Glucose, Bld: 164 mg/dL — ABNORMAL HIGH (ref 70–99)
Potassium: 4 mmol/L (ref 3.5–5.1)
Sodium: 138 mmol/L (ref 135–145)
Total Bilirubin: 0.7 mg/dL (ref 0.0–1.2)
Total Protein: 7.3 g/dL (ref 6.5–8.1)

## 2023-07-02 LAB — I-STAT VENOUS BLOOD GAS, ED
Acid-Base Excess: 1 mmol/L (ref 0.0–2.0)
Bicarbonate: 24.2 mmol/L (ref 20.0–28.0)
Calcium, Ion: 1.06 mmol/L — ABNORMAL LOW (ref 1.15–1.40)
HCT: 40 % (ref 39.0–52.0)
Hemoglobin: 13.6 g/dL (ref 13.0–17.0)
O2 Saturation: 96 %
Potassium: 3.9 mmol/L (ref 3.5–5.1)
Sodium: 139 mmol/L (ref 135–145)
TCO2: 25 mmol/L (ref 22–32)
pCO2, Ven: 31.8 mm[Hg] — ABNORMAL LOW (ref 44–60)
pH, Ven: 7.489 — ABNORMAL HIGH (ref 7.25–7.43)
pO2, Ven: 73 mm[Hg] — ABNORMAL HIGH (ref 32–45)

## 2023-07-02 LAB — CBC WITH DIFFERENTIAL/PLATELET
Abs Immature Granulocytes: 0.01 10*3/uL (ref 0.00–0.07)
Basophils Absolute: 0 10*3/uL (ref 0.0–0.1)
Basophils Relative: 0 %
Eosinophils Absolute: 0 10*3/uL (ref 0.0–0.5)
Eosinophils Relative: 1 %
HCT: 40.1 % (ref 39.0–52.0)
Hemoglobin: 13.4 g/dL (ref 13.0–17.0)
Immature Granulocytes: 0 %
Lymphocytes Relative: 15 %
Lymphs Abs: 1 10*3/uL (ref 0.7–4.0)
MCH: 30 pg (ref 26.0–34.0)
MCHC: 33.4 g/dL (ref 30.0–36.0)
MCV: 89.9 fL (ref 80.0–100.0)
Monocytes Absolute: 0.5 10*3/uL (ref 0.1–1.0)
Monocytes Relative: 7 %
Neutro Abs: 5.2 10*3/uL (ref 1.7–7.7)
Neutrophils Relative %: 77 %
Platelets: 273 10*3/uL (ref 150–400)
RBC: 4.46 MIL/uL (ref 4.22–5.81)
RDW: 14.1 % (ref 11.5–15.5)
WBC: 6.8 10*3/uL (ref 4.0–10.5)
nRBC: 0 % (ref 0.0–0.2)

## 2023-07-02 LAB — TROPONIN I (HIGH SENSITIVITY)
Troponin I (High Sensitivity): 15 ng/L (ref <18)
Troponin I (High Sensitivity): 17 ng/L (ref <18)

## 2023-07-02 LAB — RESP PANEL BY RT-PCR (RSV, FLU A&B, COVID)  RVPGX2
Influenza A by PCR: NEGATIVE
Influenza B by PCR: NEGATIVE
Resp Syncytial Virus by PCR: NEGATIVE
SARS Coronavirus 2 by RT PCR: NEGATIVE

## 2023-07-02 LAB — BRAIN NATRIURETIC PEPTIDE: B Natriuretic Peptide: 112.7 pg/mL — ABNORMAL HIGH (ref 0.0–100.0)

## 2023-07-02 LAB — D-DIMER, QUANTITATIVE: D-Dimer, Quant: 0.84 ug{FEU}/mL — ABNORMAL HIGH (ref 0.00–0.50)

## 2023-07-02 MED ORDER — METOCLOPRAMIDE HCL 5 MG/ML IJ SOLN
10.0000 mg | Freq: Once | INTRAMUSCULAR | Status: AC
  Administered 2023-07-02: 10 mg via INTRAVENOUS
  Filled 2023-07-02: qty 2

## 2023-07-02 MED ORDER — IOHEXOL 350 MG/ML SOLN
65.0000 mL | Freq: Once | INTRAVENOUS | Status: AC | PRN
  Administered 2023-07-02: 65 mL via INTRAVENOUS

## 2023-07-02 MED ORDER — FAMOTIDINE 20 MG PO TABS
20.0000 mg | ORAL_TABLET | Freq: Two times a day (BID) | ORAL | 0 refills | Status: AC
  Filled 2023-07-02: qty 30, 15d supply, fill #0

## 2023-07-02 MED ORDER — METOCLOPRAMIDE HCL 10 MG PO TABS
10.0000 mg | ORAL_TABLET | Freq: Four times a day (QID) | ORAL | 0 refills | Status: AC
  Filled 2023-07-02: qty 30, 8d supply, fill #0

## 2023-07-02 MED ORDER — LORAZEPAM 1 MG PO TABS
0.5000 mg | ORAL_TABLET | Freq: Once | ORAL | Status: AC
  Administered 2023-07-02: 0.5 mg via ORAL
  Filled 2023-07-02: qty 1

## 2023-07-02 MED ORDER — AMOXICILLIN-POT CLAVULANATE 875-125 MG PO TABS
1.0000 | ORAL_TABLET | Freq: Two times a day (BID) | ORAL | 0 refills | Status: DC
  Filled 2023-07-02: qty 14, 7d supply, fill #0

## 2023-07-02 NOTE — ED Notes (Signed)
 Patient is being discharged from the Urgent Care and sent to the Emergency Department via POV . Per Manuelita Search PA-C, patient is in need of higher level of care due to air way obstruction. Patient is aware and verbalizes understanding of plan of care.  Vitals:   07/02/23 0851  BP: (!) 149/81  Pulse: (!) 110  Resp: 18  Temp: 98.3 F (36.8 C)  SpO2: 96%

## 2023-07-02 NOTE — Discharge Instructions (Addendum)
 Thank you for coming to [facility] Emergency Department. You were seen for likely a sinus infection and hiccups. You had a CT of your chest which showed a lung nodule as well as a thyroid  nodule. The lung nodule can be followed up with CT in 6-12 months, and the thyroid  nodule should be evaluated by ultrasound.   For the hiccups, you have been prescribed reglan  10 mg as needed every 6-8 hours and pepcid  20 mg twice per day for 30 days. Please treat your sinus infection with augmentin  for 7 days.   Please follow up with your primary care provider within 1 week.   Do not hesitate to return to the ED or call 911 if you experience: -Worsening symptoms -Chest pain -Shortness of breath -Abdominal pain -Nausea/vomiting so severe you cannot eat/drink anything -Lightheadedness, passing out -Fevers/chills -Anything else that concerns you

## 2023-07-02 NOTE — ED Provider Notes (Signed)
 [facility] Provider Note   CSN: [id number] Arrival date & time: 07/02/23  0915     History  Chief Complaint  Patient presents with   Facial Pain   hicups   Nasal Congestion   Shortness of Breath    [name] is a 73 y.o. male with PMH as listed below who presents with cough, congestion for several weeks to months. He states he also has had hiccups, which has happened to him before several years ago (just went away eventually) and when the postnasal drip runs down the back of his throat and he starts hiccupping, he feels like he chokes and like he can't breathe. He has felt nauseated and vomited for this reason as well. He is taking Flonase  and Coricidin, which haven't helped.  He also has some pain on the left side of his face with nasal drainage from the left side.,  And that has been going on for several weeks to months as well.  When he is not hiccupping he feels fine. Was sent from UC d/t airway obstruction. Denies any fevers or chills, chest pain, h/o DVT/PE, productive cough, hemoptysis, fevers/chills, abdominal pain, nausea, leg swelling, sore throat, headache, visual changes. Patient has had hiccups for a few days, and has a history of similar. He states that when he was in the ED several years ago, they gave him a medication that helped, unsure what it was.  He denies alcohol or drug use.  Past Medical History:  Diagnosis Date   Abscess of anal and rectal regions 07/15/2007   Qualifier: Diagnosis of  By: Adella MD, Elizabeth     Atrial fibrillation [facility])    Diabetes mellitus without complication (HCC) 2008   type 2   FISTULA, ANAL 01/27/2007   Qualifier: Diagnosis of  By: Adella MD, Elizabeth     GERD (gastroesophageal reflux disease)    Hypertension Dx 2008   Primary open angle glaucoma (POAG) of both eyes    Mild 06/25/22 Dr. Roz   Prostate cancer [facility])    Spontaneous pneumothorax    Stroke [facility])         Home Medications Prior to Admission medications   Medication Sig Start Date End Date Taking? Authorizing Provider  famotidine  (PEPCID ) 20 MG tablet Take 1 tablet (20 mg total) by mouth 2 (two) times daily. 07/02/23  Yes Franklyn Sid SAILOR, MD  metoCLOPramide  (REGLAN ) 10 MG tablet Take 1 tablet (10 mg total) by mouth every 6 (six) hours. 07/02/23  Yes Franklyn Sid SAILOR, MD  amLODipine  (NORVASC ) 10 MG tablet Take 1 tablet (10 mg total) by mouth daily. 09/30/22   Vicci Barnie NOVAK, MD  apixaban  (ELIQUIS ) 5 MG TABS tablet Take 1 tablet (5 mg total) by mouth 2 (two) times daily. 01/21/23   Vicci Barnie NOVAK, MD  B Complex Vitamins (VITAMIN B COMPLEX ) TABS Take 1 tablet by mouth daily.    [provider]  diclofenac  Sodium (VOLTAREN ) 1 % GEL Apply 2 g topically 3 (three) times daily as needed (pain). 07/14/22   Vicci Barnie NOVAK, MD  dicyclomine  (BENTYL ) 20 MG tablet Take 1 tablet (20 mg total) by mouth 4 (four) times daily as needed (intestinal cramps). 05/01/22   Banister, Pamela K, MD  fluticasone  (FLONASE ) 50 MCG/ACT nasal spray Place 1 spray into both nostrils at bedtime x 1 week then use as needed 04/05/23   Vicci Barnie NOVAK, MD  glimepiride  (AMARYL ) 1 MG tablet Take 1 tablet (  1 mg total) by mouth daily with breakfast. 09/30/22   Vicci Barnie NOVAK, MD  hydrOXYzine  (VISTARIL ) 25 MG capsule Take 1 capsule (25 mg total) by mouth 2 (two) times daily as needed. 05/11/23   Vicci Barnie NOVAK, MD  latanoprost  (XALATAN ) 0.005 % ophthalmic solution Place 1 drop into both eyes nightly. 12/07/22     loratadine  (CLARITIN ) 10 MG tablet Take 1 tablet (10 mg total) by mouth daily as needed for allergies. 04/05/23   Vicci Barnie NOVAK, MD  LORazepam  (ATIVAN ) 0.5 MG tablet Take 1 tablet (0.5 mg total) by mouth every 8 (eight) hours as needed (hiccups). 07/05/23   Christia Budds, MD  Multiple Vitamin (MULTIVITAMIN WITH MINERALS) TABS tablet Take 1 tablet by mouth daily.    [provider]  omeprazole   (PRILOSEC) 20 MG capsule Take 1 capsule (20 mg total) by mouth daily. 06/12/22   Vicci Barnie NOVAK, MD  rosuvastatin  (CRESTOR ) 5 MG tablet Take 1 tablet (5 mg total) by mouth daily. STOP Pravastatin .  Pt wants to be notified when ready for pick up 05/18/23   Vicci Barnie NOVAK, MD      Allergies    Lisinopril  and Tramadol  hcl    Review of Systems   Review of Systems A 10 point review of systems was performed and is negative unless otherwise reported in HPI.  Physical Exam Updated Vital Signs BP (!) 146/79   Pulse 69   Resp 19   Ht 6' 1 (1.854 m)   Wt 87.5 kg   SpO2 100%   BMI 25.46 kg/m  Physical Exam General: Normal appearing male, lying in bed.  HEENT: PERRLA, EOMI, Sclera anicteric, MMM, trachea midline. Clear oropharynx, no tonsillar exudates, uvula hangs midline. Normal appearing nasal mucosa.  Bilateral TM clear with no AOM or foreign bodies noted. Cardiology: RRR, no murmurs/rubs/gallops.  Resp: No stridor, no respiratory distress.  Normal respiratory rate and effort. CTAB, no wheezes, rhonchi, crackles.  Abd: Soft, non-tender, non-distended. No rebound tenderness or guarding.  GU: Deferred. MSK: No peripheral edema or signs of trauma. Extremities without deformity or TTP.  Skin: warm, dry. Neuro: A&Ox4, CNs II-XII grossly intact. MAEs. Sensation grossly intact.  Psych: Jovial mood and affect.   ED Results / Procedures / Treatments   Labs (all labs ordered are listed, but only abnormal results are displayed) Labs Reviewed  COMPREHENSIVE METABOLIC PANEL - Abnormal; Notable for the following components:      Result Value   Glucose, Bld 164 (*)    All other components within normal limits  BRAIN NATRIURETIC PEPTIDE - Abnormal; Notable for the following components:   B Natriuretic Peptide 112.7 (*)    All other components within normal limits  D-DIMER, QUANTITATIVE - Abnormal; Notable for the following components:   D-Dimer, Quant 0.84 (*)    All other components  within normal limits  I-STAT VENOUS BLOOD GAS, ED - Abnormal; Notable for the following components:   pH, Ven 7.489 (*)    pCO2, Ven 31.8 (*)    pO2, Ven 73 (*)    Calcium , Ion 1.06 (*)    All other components within normal limits  RESP PANEL BY RT-PCR (RSV, FLU A&B, COVID)  [id number]  CBC WITH DIFFERENTIAL/PLATELET  TROPONIN I (HIGH SENSITIVITY)  TROPONIN I (HIGH SENSITIVITY)    EKG EKG Interpretation Date/Time:  Friday July 02 2023 10:02:11 EST Ventricular Rate:  77 PR Interval:  180 QRS Duration:  95 QT Interval:  392 QTC Calculation: 444 R Axis:  29  Text Interpretation: Sinus rhythm Consider anterior infarct Minimal ST depression, inferior leads Confirmed by Jerrol Agent ([id number]) on 07/03/2023 3:51:27 PM   Radiology CXR: No active disease.   CT PE: No definite evidence of pulmonary embolus.   Moderate size sliding-type hiatal hernia.   6 mm nodule is now noted in left upper lobe. Non-contrast chest CT at 6-12 months is recommended. If the nodule is stable at time of repeat CT, then future CT at 18-24 months (from today's scan) is considered optional for low-risk patients, but is recommended for high-risk patients. This recommendation follows the consensus statement: Guidelines for Management of Incidental Pulmonary Nodules Detected on CT Images: From the Fleischner Society 2017; Radiology 2017; 284:228-243.   1.6 cm right thyroid  nodule is noted. Recommend thyroid  US . (Ref: J Am Coll Radiol. 2015 Feb;12(2): 143-50).   Mild coronary artery calcifications are noted.   Aortic Atherosclerosis (ICD10-I70.0) and Emphysema (ICD10-J43.9).    Procedures Procedures    Medications Ordered in ED Medications  metoCLOPramide  (REGLAN ) injection 10 mg (10 mg Intravenous Given 07/02/23 1041)  iohexol  (OMNIPAQUE ) 350 MG/ML injection 65 mL (65 mLs Intravenous Contrast Given 07/02/23 1215)  LORazepam  (ATIVAN ) tablet 0.5 mg (0.5 mg Oral Given 07/02/23 1340)    ED Course/  Medical Decision Making/ A&P                          Medical Decision Making Amount and/or Complexity of Data Reviewed Labs: ordered. Decision-making details documented in ED Course. Radiology: ordered. Decision-making details documented in ED Course.  Risk Prescription drug management.    This patient presents to the ED for concern of concern for airway obstruction cough congestion and hiccuping, this involves an extensive number of treatment options, and is a complaint that carries with it a high risk of complications and morbidity.  I considered the following differential and admission for this acute condition.   MDM:    Patient presents with several complaints.  He was sent from urgent care for concern for airway obstruction.  On my assessment of the patient he has no stridor, no shortness of breath or respiratory distress, no abnormalities on his oropharyngeal exam.  Patient does not have any airway obstruction.  What he relates to me is a sensation of fullness or even choking when his postnasal drip and hiccuping combined.  He also has some nausea and vomiting.  Consider causes of persistent hiccuping.  I do not believe patient is having any neurologic cause as his neurologic exam is otherwise normal.  He could be having phrenic nerve irritation due to postnasal drip or pharyngitis, though his oropharyngeal exam is also clear.  No ear foreign bodies or infections.  He is complaining of a cough and given the hiccups and history of the same with possible concern for diaphragmatic irritation will obtain chest x-ray and a D-dimer to rule out PE, pneumonia.  He does not have any signs of DVT on exam, no tachycardia.  Consider other causes of shortness of breath but he does not appear fluid overloaded.  BNP is very mildly elevated and chest x-ray does not demonstrate any pulmonary edema.  Do not think he has orthopnea.  He has no electrolyte derangements and his viral panel is negative  reassuringly.   Clinical Course as of 07/07/23 1525  Fri Jul 02, 2023  1035 CBC with Differential wnl [HN]  1035 DG Chest Portable 1 View No active disease. [HN]  1104 Troponin I (  High Sensitivity): 15 neg [HN]  1104 B Natriuretic Peptide(!): 112.7 Mildly elevated [HN]  1104 D-Dimer, Quant(!): 0.84 Will get CT PE [HN]  1140 Resp panel by RT-PCR (RSV, Flu A&B, Covid) Anterior Nasal Swab neg [HN]  1312 CT Angio Chest PE W and/or Wo Contrast No definite evidence of pulmonary embolus.  Moderate size sliding-type hiatal hernia.  6 mm nodule is now noted in left upper lobe. Non-contrast chest CT at 6-12 months is recommended. If the nodule is stable at time of repeat CT, then future CT at 18-24 months (from today's scan) is considered optional for low-risk patients, but is recommended for high-risk patients. This recommendation follows the consensus statement: Guidelines for Management of Incidental Pulmonary Nodules Detected on CT Images: From the Fleischner Society 2017; Radiology 2017; 284:228-243.  1.6 cm right thyroid  nodule is noted. Recommend thyroid  US . (Ref: J Am Coll Radiol. 2015 Feb;12(2): 143-50).  Mild coronary artery calcifications are noted.  Aortic Atherosclerosis (ICD10-I70.0) and Emphysema (ICD10-J43.9).   [HN]  1312 No PE on imaging. Does have LUL nodule, thyroid  nodule. Will inform patient of both of these findings. [HN]    Clinical Course User Index [HN] Franklyn Sid SAILOR, MD    On imaging patient is found to have a hiatal hernia as well as a thyroid  nodule.  Either 1 of these findings could also be contributing to his hiccups.  Will prescribe patient antibiotics for likely sinusitis, as well as Reglan  and Pepcid  for nausea and possible acid reflux contributing, feeling like things are coming up.  He is informed of his lung nodule and the need for follow-up but he has no PE or any other emergent findings.  Patient is extremely well-appearing and has not had  any nausea vomiting or episodes of choking or concern for any airway obstruction during his greater than 4-hour stay in the emergency department.  Patient is asking to be discharged and is very hungry.  Patient be discharged with PCP follow-up within 1 to 2 weeks, given discharge instructions and return precautions, all questions answered to patient satisfaction.   Labs: I Ordered, and personally interpreted labs.  The pertinent results include: Those listed above  Imaging Studies ordered: I ordered imaging studies including chest x-ray, CT PE I independently visualized and interpreted imaging. I agree with the radiologist interpretation  Additional history obtained from chart review, wife at bedside.  External records from outside source obtained and reviewed including 2012 [facility], ED appointment  Cardiac Monitoring: The patient was maintained on a cardiac monitor.  I personally viewed and interpreted the cardiac monitored which showed an underlying rhythm of: Normal sinus rhythm  Reevaluation: After the interventions noted above, I reevaluated the patient and found that they have :stayed the same  Social Determinants of Health:  lives independently with his wife  Disposition:  DC    Co morbidities that complicate the patient evaluation  Past Medical History:  Diagnosis Date   Abscess of anal and rectal regions 07/15/2007   Qualifier: Diagnosis of  By: Adella MD, Elizabeth     Atrial fibrillation [facility])    Diabetes mellitus without complication (HCC) 2008   type 2   FISTULA, ANAL 01/27/2007   Qualifier: Diagnosis of  By: Adella MD, Elizabeth     GERD (gastroesophageal reflux disease)    Hypertension Dx 2008   Primary open angle glaucoma (POAG) of both eyes    Mild 06/25/22 Dr. Roz   Prostate cancer [facility])    Spontaneous pneumothorax  Stroke (HCC)      Medicines Meds ordered this encounter  Medications   metoCLOPramide  (REGLAN ) injection 10 mg   iohexol   (OMNIPAQUE ) 350 MG/ML injection 65 mL   metoCLOPramide  (REGLAN ) 10 MG tablet    Sig: Take 1 tablet (10 mg total) by mouth every 6 (six) hours.    Dispense:  30 tablet    Refill:  0   famotidine  (PEPCID ) 20 MG tablet    Sig: Take 1 tablet (20 mg total) by mouth 2 (two) times daily.    Dispense:  30 tablet    Refill:  0   DISCONTD: amoxicillin -clavulanate (AUGMENTIN ) 875-125 MG tablet    Sig: Take 1 tablet by mouth every 12 (twelve) hours.    Dispense:  14 tablet    Refill:  0   LORazepam  (ATIVAN ) tablet 0.5 mg    I have reviewed the patients home medicines and have made adjustments as needed  Problem List / ED Course: Problem List Items Addressed This Visit   None Visit Diagnoses       Chronic sinusitis, unspecified location    -  Primary     Hiccups                       This note was created using dictation software, which may contain spelling or grammatical errors.    Franklyn Sid SAILOR, MD 07/07/23 1535

## 2023-07-02 NOTE — ED Triage Notes (Signed)
 Pt. Stated, Ive had hicups continuously off on on for 2 days. Ive not been able to catch my breathe 3-4 times per hour. It seems like my chest is jerking. I have a lot of muscous in my sinuses for 2 months.

## 2023-07-02 NOTE — ED Provider Notes (Signed)
 Patient states that he has had persistent hiccups and shortness of breath for the past 3 days.  Patient states that 3-4 times an hour, he will hiccup and have difficulty breathing.  This occurred during triage today.  Patient states when this occurs, he feels that ear is trapped and cannot move in or out.  Patient SpO2 was 96% on arrival with a heart rate of 110 and blood pressure 149/81.  Patient deemed stable for transfer to ED via private vehicle for further evaluation.SABRA Joesph Shaver Scales, PA-C 07/02/23 [phone]

## 2023-07-02 NOTE — ED Triage Notes (Signed)
 Pt states he has cough, congestion for awhile and has seen him PCP. He states hiccups and SOB when trying to get up out of bed X 3 days. He is taking Flonase  and cloricidin.    He states 3-4 times an hour he feels like he can't breathe. In triage pt is gasping for a breath but recovers quickly back to conversation. Pt states this happens when he is hiccupping and the air is trapped.

## 2023-07-04 ENCOUNTER — Encounter (HOSPITAL_COMMUNITY): Payer: Self-pay | Admitting: Emergency Medicine

## 2023-07-04 ENCOUNTER — Emergency Department (HOSPITAL_COMMUNITY): Payer: Medicare HMO

## 2023-07-04 ENCOUNTER — Observation Stay (HOSPITAL_COMMUNITY)
Admission: EM | Admit: 2023-07-04 | Discharge: 2023-07-05 | Disposition: A | Discharge status: Home / Self Care | Payer: Medicare HMO | Attending: Family Medicine | Admitting: Family Medicine

## 2023-07-04 ENCOUNTER — Other Ambulatory Visit: Payer: Self-pay

## 2023-07-04 DIAGNOSIS — E785 Hyperlipidemia, unspecified: Secondary | ICD-10-CM | POA: Diagnosis not present

## 2023-07-04 DIAGNOSIS — Z79899 Other long term (current) drug therapy: Secondary | ICD-10-CM | POA: Insufficient documentation

## 2023-07-04 DIAGNOSIS — I4891 Unspecified atrial fibrillation: Secondary | ICD-10-CM | POA: Diagnosis not present

## 2023-07-04 DIAGNOSIS — R066 Hiccough: Secondary | ICD-10-CM | POA: Diagnosis not present

## 2023-07-04 DIAGNOSIS — Z8673 Personal history of transient ischemic attack (TIA), and cerebral infarction without residual deficits: Secondary | ICD-10-CM | POA: Insufficient documentation

## 2023-07-04 DIAGNOSIS — R1110 Vomiting, unspecified: Principal | ICD-10-CM | POA: Insufficient documentation

## 2023-07-04 DIAGNOSIS — Z7901 Long term (current) use of anticoagulants: Secondary | ICD-10-CM | POA: Insufficient documentation

## 2023-07-04 DIAGNOSIS — I10 Essential (primary) hypertension: Secondary | ICD-10-CM | POA: Insufficient documentation

## 2023-07-04 DIAGNOSIS — R109 Unspecified abdominal pain: Secondary | ICD-10-CM | POA: Diagnosis not present

## 2023-07-04 DIAGNOSIS — J189 Pneumonia, unspecified organism: Secondary | ICD-10-CM | POA: Diagnosis present

## 2023-07-04 DIAGNOSIS — R112 Nausea with vomiting, unspecified: Secondary | ICD-10-CM | POA: Diagnosis not present

## 2023-07-04 LAB — COMPREHENSIVE METABOLIC PANEL
ALT: 12 U/L (ref 0–44)
AST: 14 U/L — ABNORMAL LOW (ref 15–41)
Albumin: 3.8 g/dL (ref 3.5–5.0)
Alkaline Phosphatase: 73 U/L (ref 38–126)
Anion gap: 11 (ref 5–15)
BUN: 17 mg/dL (ref 8–23)
CO2: 24 mmol/L (ref 22–32)
Calcium: 8.9 mg/dL (ref 8.9–10.3)
Chloride: 104 mmol/L (ref 98–111)
Creatinine, Ser: 1.21 mg/dL (ref 0.61–1.24)
GFR, Estimated: >60 mL/min (ref >60)
Glucose, Bld: 143 mg/dL — ABNORMAL HIGH (ref 70–99)
Potassium: 3.7 mmol/L (ref 3.5–5.1)
Sodium: 139 mmol/L (ref 135–145)
Total Bilirubin: 0.8 mg/dL (ref 0.0–1.2)
Total Protein: 7.3 g/dL (ref 6.5–8.1)

## 2023-07-04 LAB — CBC
HCT: 40.8 % (ref 39.0–52.0)
Hemoglobin: 13.5 g/dL (ref 13.0–17.0)
MCH: 30 pg (ref 26.0–34.0)
MCHC: 33.1 g/dL (ref 30.0–36.0)
MCV: 90.7 fL (ref 80.0–100.0)
Platelets: 272 10*3/uL (ref 150–400)
RBC: 4.5 MIL/uL (ref 4.22–5.81)
RDW: 14.3 % (ref 11.5–15.5)
WBC: 6.6 10*3/uL (ref 4.0–10.5)
nRBC: 0 % (ref 0.0–0.2)

## 2023-07-04 LAB — GLUCOSE, CAPILLARY
Glucose-Capillary: 209 mg/dL — ABNORMAL HIGH (ref 70–99)
Glucose-Capillary: 164 mg/dL — ABNORMAL HIGH (ref 70–99)

## 2023-07-04 LAB — TROPONIN I (HIGH SENSITIVITY)
Troponin I (High Sensitivity): 14 ng/L (ref <18)
Troponin I (High Sensitivity): 12 ng/L (ref <18)

## 2023-07-04 LAB — TSH: TSH: 2.309 u[IU]/mL (ref 0.350–4.500)

## 2023-07-04 LAB — LIPASE, BLOOD: Lipase: 51 U/L (ref 11–51)

## 2023-07-04 MED ORDER — PANTOPRAZOLE SODIUM 40 MG PO TBEC
40.0000 mg | DELAYED_RELEASE_TABLET | Freq: Every day | ORAL | Status: DC
  Administered 2023-07-05: 40 mg via ORAL
  Filled 2023-07-04: qty 1

## 2023-07-04 MED ORDER — GLIMEPIRIDE 1 MG PO TABS: 1.0000 mg | ORAL_TABLET | Freq: Every day | ORAL | Status: DC

## 2023-07-04 MED ORDER — METOCLOPRAMIDE HCL 5 MG/ML IJ SOLN
10.0000 mg | Freq: Once | INTRAMUSCULAR | Status: AC
  Administered 2023-07-04: 10 mg via INTRAVENOUS
  Filled 2023-07-04: qty 2

## 2023-07-04 MED ORDER — FAMOTIDINE 20 MG PO TABS
20.0000 mg | ORAL_TABLET | Freq: Two times a day (BID) | ORAL | Status: DC
  Administered 2023-07-05: 20 mg via ORAL
  Filled 2023-07-04: qty 1

## 2023-07-04 MED ORDER — ROSUVASTATIN CALCIUM 5 MG PO TABS
5.0000 mg | ORAL_TABLET | Freq: Every day | ORAL | Status: DC
Start: 2023-07-05 — End: 2023-07-05
  Administered 2023-07-05: 5 mg via ORAL
  Filled 2023-07-04 (×2): qty 1

## 2023-07-04 MED ORDER — SODIUM CHLORIDE 0.9 % IV SOLN
25.0000 mg | Freq: Once | INTRAVENOUS | Status: AC
  Administered 2023-07-04: 25 mg via INTRAVENOUS
  Filled 2023-07-04: qty 1

## 2023-07-04 MED ORDER — PANTOPRAZOLE SODIUM 40 MG IV SOLR
40.0000 mg | Freq: Once | INTRAVENOUS | Status: AC
  Administered 2023-07-04: 40 mg via INTRAVENOUS
  Filled 2023-07-04: qty 10

## 2023-07-04 MED ORDER — APIXABAN 5 MG PO TABS
5.0000 mg | ORAL_TABLET | Freq: Two times a day (BID) | ORAL | Status: DC
  Administered 2023-07-05: 5 mg via ORAL
  Filled 2023-07-04: qty 1

## 2023-07-04 MED ORDER — AMLODIPINE BESYLATE 10 MG PO TABS
10.0000 mg | ORAL_TABLET | Freq: Every day | ORAL | Status: DC
Start: 2023-07-05 — End: 2023-07-05
  Administered 2023-07-05: 10 mg via ORAL
  Filled 2023-07-04: qty 1

## 2023-07-04 MED ORDER — SODIUM CHLORIDE 0.9 % BOLUS PEDS
1000.0000 mL | Freq: Once | INTRAVENOUS | Status: AC
  Administered 2023-07-04: 1000 mL via INTRAVENOUS

## 2023-07-04 MED ORDER — LATANOPROST 0.005 % OP SOLN
1.0000 [drp] | Freq: Every evening | OPHTHALMIC | Status: DC
  Filled 2023-07-04: qty 2.5

## 2023-07-04 MED ORDER — FAMOTIDINE IN NACL 20-0.9 MG/50ML-% IV SOLN: 20.0000 mg | Freq: Once | INTRAVENOUS | Status: DC

## 2023-07-04 MED ORDER — ACETAMINOPHEN 650 MG RE SUPP: 650.0000 mg | Freq: Four times a day (QID) | RECTAL | Status: DC | PRN

## 2023-07-04 MED ORDER — GLIMEPIRIDE 1 MG PO TABS
1.0000 mg | ORAL_TABLET | Freq: Every day | ORAL | Status: DC
  Administered 2023-07-05: 1 mg via ORAL
  Filled 2023-07-04: qty 1

## 2023-07-04 MED ORDER — FLUTICASONE PROPIONATE 50 MCG/ACT NA SUSP
1.0000 | Freq: Every day | NASAL | Status: DC
  Administered 2023-07-04: 1 via NASAL
  Filled 2023-07-04: qty 16

## 2023-07-04 MED ORDER — PANTOPRAZOLE SODIUM 40 MG IV SOLR
40.0000 mg | Freq: Once | INTRAVENOUS | Status: DC
Start: 2023-07-04 — End: 2023-07-04

## 2023-07-04 MED ORDER — CHLORPROMAZINE HCL 25 MG PO TABS
25.0000 mg | ORAL_TABLET | Freq: Once | ORAL | Status: AC
  Administered 2023-07-04: 25 mg via ORAL
  Filled 2023-07-04: qty 1

## 2023-07-04 MED ORDER — ACETAMINOPHEN 325 MG PO TABS: 650.0000 mg | ORAL_TABLET | Freq: Four times a day (QID) | ORAL | Status: DC | PRN

## 2023-07-04 NOTE — ED Triage Notes (Signed)
 Pt states he was here Friday and continues to have hiccups. States after he left hospital, he ate [employer] sandwich and threw up medication. Continues to vomit since.

## 2023-07-04 NOTE — Assessment & Plan Note (Signed)
 Suspect in the setting of viral gastroenteritis after eating [employer] 1/10. US  RUQ reassuringly negative for acute findings. S/p Reglan  10mg  IV in the ED.  - CTM sx - Thorazine  25mg  IV for hiccups and vomiting - consider GI consult if vomiting persists despite antiemetics

## 2023-07-04 NOTE — Plan of Care (Signed)
 FMTS Brief Progress Note  S:pt sleeping soundly    O: BP 125/68 (BP Location: Right Arm)   Pulse 84   Temp 98.2 F (36.8 C) (Oral)   Resp 18   Ht 6' 1 (1.854 m)   Wt 87 kg   SpO2 96%   BMI 25.30 kg/m   Sleeping soundly, NAD, still having visible hiccups but breathing comfortably on RA  A/P: Intractable hiccups and vomiting  Still having hiccups s/p thorazine  IV but appears comfortable and sleeping well Consider re-dosing thorazine   CTM Continue plan as in H&P   Joshua Domino, DO 07/04/2023, 10:48 PM PGY-3, [facility] Night Resident  Please page [phone] with questions.

## 2023-07-04 NOTE — H&P (Addendum)
 Hospital Admission History and Physical Service Pager: [phone]  Patient name: [name] Medical record number: [id number] Date of Birth: 16-Feb-1951 Age: 73 y.o. Gender: male  Primary Care Provider: Vicci Barnie NOVAK, MD Consultants: None Code Status: FULL CODE Preferred Emergency Contact:  Contact Information     Name Relation Home Work [place] Spouse [phone]  [phone]   Katherine, Syme Daughter [phone]  [phone]      Other Contacts   None on File     Chief Complaint: intractable hiccups and vomiting  Assessment and Plan: [name] is a 73 y.o. male presenting with intractable hiccups and vomiting. Differential for presentation of this includes GERD, esophageal irritation, medication effect, stroke, MI, hyponatremia. Suspect this is likely in the setting of GERD or viral gastroenteritis given vomiting since eating [employer] on Friday. No meds on his list known for causing esophageal irritation. Denies alcohol use. EKG and normal troponins reassuring against MI. No focal neurological deficits to suggest CNS cause. CMP unremarkable for electrolyte abnormalities.   Assessment & Plan Intractable hiccups Suspect in the setting of GERD vs viral gastroenteritis. Less likely MI or CNS cause given EKG with no new changes, negative trops, and no new neurological findings. S/p Thorazine  25mg  PO (could not tolerate), Reglan  10mg  IV, and Protonix  40mg  IV in the ED. Will continue to monitor sx overnight - Admitted to FMTS, Dr. McDiarmid attending - Thorazine  25mg  IV - continue home Pepcid  20mg  BID - Protonix  40mg  every day - POAL - regular diet - 1L NS bolus - U/A, TSH, am CBC, BMP - consider ativan  if sx do not improve as he has ?reportedly taken this in the past with improvement Vomiting Suspect in the setting of viral gastroenteritis after eating [employer] 1/10. US  RUQ reassuringly negative for acute findings. S/p Reglan  10mg  IV in the  ED.  - CTM sx - Thorazine  25mg  IV for hiccups and vomiting - consider GI consult if vomiting persists despite antiemetics  Chronic and Stable Problems:  T2DM - continue glimepiride  1mg  QD HTN - continue amlodipine  10mg  QD HLD - continue rosuvastatin  5mg  QD Afib - continue Eliquis  5mg  BID  FEN/GI: regular VTE Prophylaxis: home eliquis   Disposition: med-surg  History of Present Illness:  [name] is a 73 y.o. male presenting with intractable hiccups and vomiting. States that he was seen in the ED 1/10 and had full workup including negative respiratory panel, unremarkable CXR, CMP(nml), BNP(112.7), d-dimer(0.84), CBC(nml), troponin(nml), CTA PE(negative). Pt was discharged with Reglan  10mg  PRN, Pepcid  20mg  BID, and Augmentin  x7 days for presumed sinusitis. Pt states that he ate at [employer] after discharge and then developed intractable vomiting once he got home.  Reports that in addition to vomiting his hiccups have not improved since he was seen in the ED.  Reports intractable hiccups have happened once before 12 years ago, he received Ativan  at that time and his hiccups went away.  However he did receive Ativan  in the ED last Friday with no improvement of his hiccups.  He also reports generalized abdominal pain, and chest pain that occurs when he has the hiccups. Notes sinus drainage x 2 months. Denies shortness of breath, diarrhea, fever. Did not take his home medications today as he was unable to tolerate them.  In the ED today he was given Protonix  and Reglan  with no improvement.  Attempted to give patient Thorazine  25 mg p.o but he vomited right after.  CMP, troponins, CBC unremarkable.  Review Of Systems:  Per HPI with the following additions: none  Pertinent Past Medical History: T2DM HTN Stroke HLD Afib  Remainder reviewed in history tab.   Pertinent Past Surgical History: Lumbar laminectomy/decompression Cataract extraction   Remainder reviewed in history tab.   Pertinent Social History: Tobacco use: Smokes 1 pipe per day Alcohol use: No Other Substance use: None Lives with spouse  Pertinent Family History: Mom - diabetes, breast cancer Father - diabetes  Remainder reviewed in history tab.   Important Outpatient Medications: Amlodipine  10mg  every day Hydroxyzine  25mg  BID PRN Augmentin  875-125mg  q12h - started 1/10 for sinusitis Eliquis  5mg  BID Bentyl  30mg  QID PRN Flonase  Glimepiride  1mg  every day Latanoprost  eye drops nightly Claritin  10mg  PRN Reglan  10mg  q6h Omeprazole  20mg  every day PRN Rosuvastatin  5mg  every day Flomax  0.4mg  QD  Remainder reviewed in medication history.   Objective: BP (!) 150/103   Pulse (!) 110   Temp 97.7 F (36.5 C) (Oral)   Resp (!) 25   Ht 6' 1 (1.854 m)   Wt 87 kg   SpO2 100%   BMI 25.30 kg/m  Exam: General: well appearing, no acute distress, frequent hiccups Eyes: EOMI ENTM: Moist mucous membranes Cardiovascular: RRR, no m/r/g Respiratory: normal WOB on RA, CTAB Gastrointestinal: normal bowel sounds, soft, mild TTP epigastric and RUQ MSK: no leg swelling BLE Neuro: A&Ox4 Psych: Normal affect  Labs:  CBC BMET  Recent Labs  Lab 07/04/23 1128  WBC 6.6  HGB 13.5  HCT 40.8  PLT 272   Recent Labs  Lab 07/04/23 1128  NA 139  K 3.7  CL 104  CO2 24  BUN 17  CREATININE 1.21  GLUCOSE 143*  CALCIUM  8.9    Pertinent additional labs    EKG: NSR 82BPM, Qtc . ST elevation V2, V3 unchanged from EKG 07/02/23 and from 02/2023 EKG   Imaging Studies Performed: RUQ US  07/04/23 - negative  PRIOR ED visit on 07/02/23: CXR 07/02/23 IMPRESSION: No active disease.  CTA PE 07/02/23 IMPRESSION: No definite evidence of pulmonary embolus.   Moderate size sliding-type hiatal hernia.   6 mm nodule is now noted in left upper lobe. Non-contrast chest CT at 6-12 months is recommended. If the nodule is stable at time of repeat CT, then future CT at 18-24 months (from today's scan)  is considered optional for low-risk patients, but is recommended for high-risk patients. This recommendation follows the consensus statement: Guidelines for Management of Incidental Pulmonary Nodules Detected on CT Images: From the Fleischner Society 2017; Radiology 2017; 284:228-243.   1.6 cm right thyroid  nodule is noted. Recommend thyroid  US . (Ref: J Am Coll Radiol. 2015 Feb;12(2): 143-50).   Mild coronary artery calcifications are noted.   Aortic Atherosclerosis (ICD10-I70.0) and Emphysema (ICD10-J43.9).   Everhart, Elyce, DO 07/04/2023, 6:08 PM PGY-1, [facility]  FPTS Intern pager: [phone], text pages welcome Secure chat group [facility] Teaching Service

## 2023-07-04 NOTE — ED Provider Notes (Signed)
 [place] EMERGENCY DEPARTMENT AT [facility] Provider Note   CSN: [id number] Arrival date & time: 07/04/23  1114     History  Chief Complaint  Patient presents with  . Emesis    [name] is a 73 y.o. male.  Patient is a 73 year old male with a past medical history of diabetes, hypertension, A-fib on Eliquis  and GERD presenting to the emergency department with hiccups, nausea and vomiting.  The patient was seen in the emergency department for his hiccups on Friday and was discharged home with Reglan  and Pepcid .  He states after going home he went to [employer] for dinner and shortly after eating he started to develop nausea and vomiting.  He states that he has been unable to keep anything down since not even water.  He states that he tried to take the medications he was prescribed but vomited these up.  He states that he has some soreness of his abdomen from all the vomiting but denies any abdominal pain.  Denies any fever or diarrhea.  The history is provided by the patient.  Emesis      Home Medications Prior to Admission medications   Medication Sig Start Date End Date Taking? Authorizing Provider  amLODipine  (NORVASC ) 10 MG tablet Take 1 tablet (10 mg total) by mouth daily. 09/30/22  Yes Vicci Barnie NOVAK, MD  amoxicillin -clavulanate (AUGMENTIN ) 875-125 MG tablet Take 1 tablet by mouth every 12 (twelve) hours. 07/02/23  Yes Franklyn Sid SAILOR, MD  apixaban  (ELIQUIS ) 5 MG TABS tablet Take 1 tablet (5 mg total) by mouth 2 (two) times daily. 01/21/23  Yes Vicci Barnie NOVAK, MD  B Complex Vitamins (VITAMIN B COMPLEX ) TABS Take 1 tablet by mouth daily.   Yes [provider]  diclofenac  Sodium (VOLTAREN ) 1 % GEL Apply 2 g topically 3 (three) times daily as needed (pain). 07/14/22  Yes Vicci Barnie NOVAK, MD  Multiple Vitamin (MULTIVITAMIN WITH MINERALS) TABS tablet Take 1 tablet by mouth daily.   Yes [provider]  dicyclomine  (BENTYL ) 20 MG tablet Take  1 tablet (20 mg total) by mouth 4 (four) times daily as needed (intestinal cramps). 05/01/22   Vonna Sharlet POUR, MD  famotidine  (PEPCID ) 20 MG tablet Take 1 tablet (20 mg total) by mouth 2 (two) times daily. 07/02/23   Franklyn Sid SAILOR, MD  fluticasone  (FLONASE ) 50 MCG/ACT nasal spray Place 1 spray into both nostrils at bedtime x 1 week then use as needed 04/05/23   Vicci Barnie NOVAK, MD  glimepiride  (AMARYL ) 1 MG tablet Take 1 tablet (1 mg total) by mouth daily with breakfast. 09/30/22   Vicci Barnie NOVAK, MD  HYDROcodone -acetaminophen  (NORCO) 10-325 MG tablet Take 1 tablet by mouth every 4 (four) hours as needed. 10/29/22   [provider]  hydrOXYzine  (VISTARIL ) 25 MG capsule Take 1 capsule (25 mg total) by mouth 2 (two) times daily as needed. 05/11/23   Vicci Barnie NOVAK, MD  latanoprost  (XALATAN ) 0.005 % ophthalmic solution Place 1 drop into both eyes nightly. 04/10/21     latanoprost  (XALATAN ) 0.005 % ophthalmic solution Place 1 drop into both eyes nightly. 12/07/22     loratadine  (CLARITIN ) 10 MG tablet Take 1 tablet (10 mg total) by mouth daily as needed for allergies. 04/05/23   Vicci Barnie NOVAK, MD  metoCLOPramide  (REGLAN ) 10 MG tablet Take 1 tablet (10 mg total) by mouth every 6 (six) hours. 07/02/23   Franklyn Sid SAILOR, MD  omeprazole  (PRILOSEC) 20 MG capsule Take 1  capsule (20 mg total) by mouth daily. 06/12/22   Vicci Barnie NOVAK, MD  rosuvastatin  (CRESTOR ) 5 MG tablet Take 1 tablet (5 mg total) by mouth daily. STOP Pravastatin .  Pt wants to be notified when ready for pick up 05/18/23   Vicci Barnie NOVAK, MD  tamsulosin  (FLOMAX ) 0.4 MG CAPS capsule Take 1 capsule (0.4 mg total) by mouth daily after supper. 05/11/21   Patrcia Cough, MD      Allergies    Lisinopril  and Tramadol  hcl    Review of Systems   Review of Systems  Gastrointestinal:  Positive for vomiting.    Physical Exam Updated Vital Signs BP (!) 146/85   Pulse 85   Temp 98.6 F (37 C) (Oral)   Resp (!)  21   Ht 6' 1 (1.854 m)   Wt 87 kg   SpO2 100%   BMI 25.30 kg/m  Physical Exam Vitals and nursing note reviewed.  Constitutional:      General: He is not in acute distress.    Appearance: Normal appearance.  HENT:     Head: Normocephalic and atraumatic.     Nose: Nose normal.     Mouth/Throat:     Mouth: Mucous membranes are moist.     Pharynx: Oropharynx is clear.  Eyes:     Extraocular Movements: Extraocular movements intact.     Conjunctiva/sclera: Conjunctivae normal.  Cardiovascular:     Rate and Rhythm: Normal rate and regular rhythm.     Heart sounds: Normal heart sounds.  Pulmonary:     Effort: Pulmonary effort is normal.     Breath sounds: Normal breath sounds.  Abdominal:     General: Abdomen is flat.     Palpations: Abdomen is soft.     Tenderness: There is no abdominal tenderness.  Musculoskeletal:        General: Normal range of motion.     Cervical back: Normal range of motion.  Skin:    General: Skin is warm and dry.  Neurological:     General: No focal deficit present.     Mental Status: He is alert and oriented to person, place, and time.  Psychiatric:        Mood and Affect: Mood normal.        Behavior: Behavior normal.     ED Results / Procedures / Treatments   Labs (all labs ordered are listed, but only abnormal results are displayed) Labs Reviewed  COMPREHENSIVE METABOLIC PANEL - Abnormal; Notable for the following components:      Result Value   Glucose, Bld 143 (*)    AST 14 (*)    All other components within normal limits  LIPASE, BLOOD  CBC  URINALYSIS, ROUTINE W REFLEX MICROSCOPIC    EKG EKG Interpretation Date/Time:  Sunday July 04 2023 12:53:28 EST Ventricular Rate:  82 PR Interval:  182 QRS Duration:  90 QT Interval:  422 QTC Calculation: 493 R Axis:   -41  Text Interpretation: Sinus rhythm Probable left atrial enlargement Left axis deviation ST elevation, consider anterior injury Borderline prolonged QT interval No  significant change since last tracing Confirmed by Ellouise Fine ([id number]) on 07/04/2023 12:55:59 PM  Radiology No results found.   Procedures Procedures    Medications Ordered in ED Medications  metoCLOPramide  (REGLAN ) injection 10 mg (10 mg Intravenous Given 07/04/23 1213)  pantoprazole  (PROTONIX ) injection 40 mg (40 mg Intravenous Given 07/04/23 1213)  chlorproMAZINE  (THORAZINE ) tablet 25 mg (25 mg Oral Given 07/04/23 1348)  ED Course/ Medical Decision Making/ A&P Clinical Course as of 07/04/23 1508  [place] Jul 04, 2023  1415 Upon reassessment, the patient states that he vomited the Thorazine  and water when he tried to take these.  Patient will be admitted for his intractable vomiting. [VK]  1508 Patient signed out to [facility] Medicine for admission. [VK]    Clinical Course User Index [VK] Kingsley, Teagen Bucio K, DO                                 Medical Decision Making This patient presents to the ED with chief complaint(s) of hiccups, nausea vomiting with pertinent past medical history of hypertension, diabetes, A-fib on Eliquis , GERD which further complicates the presenting complaint. The complaint involves an extensive differential diagnosis and also carries with it a high risk of complications and morbidity.    The differential diagnosis includes gastritis, GERD, dehydration, electrolyte abnormality, no neurologic deficits making a neurologic disorder unlikely, no infectious symptoms making infectious disorder unlikely, had CT PE study on Friday that was normal making an intrathoracic abnormality unlikely cause of his symptoms, considering atypical ACS  Additional history obtained: Additional history obtained from N/A Records reviewed Primary Care Documents  ED Course and Reassessment: On patient's arrival he is hemodynamically stable in no acute distress.  Was initially evaluated by triage and had labs performed.  Patient's labs are within normal range without any signs of  severe dehydration.  Will have EKG performed to evaluate for atypical ACS.  Will be given Reglan  and Protonix  for symptomatic management and will be closely reassessed.  Independent labs interpretation:  The following labs were independently interpreted: within normal range  Independent visualization of imaging: - N/A  Consultation: - Consulted or discussed management/test interpretation w/ external professional: family medicine  Consideration for admission or further workup: Patient requires admission for intractable vomiting  Social Determinants of health: N/A    Amount and/or Complexity of Data Reviewed Labs: ordered.  Risk Prescription drug management. Decision regarding hospitalization.          Final Clinical Impression(s) / ED Diagnoses Final diagnoses:  Intractable vomiting  Hiccups    Rx / DC Orders ED Discharge Orders     None         Kingsley, Khallid Pasillas K, DO 07/04/23 1508

## 2023-07-04 NOTE — ED Notes (Signed)
Pt given water as PO challenge.

## 2023-07-04 NOTE — Hospital Course (Addendum)
 Bryar C Sandt is a 73 y.o.male with a history of T2DM, HTN, stroke, HLD, a fib who was admitted to the [facility] Medicine Teaching Service at [facility] for intractable hiccups. His hospital course is detailed below:  Intractable Hiccups In the ED he was given Protonix  and Reglan  with no improvement.  Attempted to give patient Thorazine  25 mg p.o but he vomited right after.  CMP, troponins, CBC unremarkable.  On the floor, he was continued on home pepcid , added protonix  40 mg daily, IV fluids and thorazine  25 mg IV with improvement. On 1/13 his hiccups and nausea resolved. Patient reported ativan  is his preferred medication to prevent hiccups, and declined thorazine  PO. He was discharged with a small supply of ativan  for abortive treatment. If hiccups return and not resolved with ativan , we recommend not refilling ativan  prescriptions. Instead try PO thorazine , because we believe this improved his hiccups. Additionally, he would benefit from GI referral to look for potential esophageal cause. Continue PPI. At time of discharge patient was tolerating PO.   Other chronic conditions were medically managed with home medications and formulary alternatives as necessary.  PCP Follow-up Recommendations: 1.6cm right thyroid  nodule - thyroid  US  6mm nodule LUL - non-contrast chest CT in 6-63mo Recommend GI follow-up for potential EGD for hiccups.

## 2023-07-04 NOTE — Assessment & Plan Note (Addendum)
 Suspect in the setting of GERD vs viral gastroenteritis. Less likely MI or CNS cause given EKG with no new changes, negative trops, and no new neurological findings. S/p Thorazine  25mg  PO (could not tolerate), Reglan  10mg  IV, and Protonix  40mg  IV in the ED. Will continue to monitor sx overnight - Admitted to FMTS, Dr. McDiarmid attending - Thorazine  25mg  IV - continue home Pepcid  20mg  BID - Protonix  40mg  every day - POAL - regular diet - 1L NS bolus - U/A, TSH, am CBC, BMP - consider ativan  if sx do not improve as he has ?reportedly taken this in the past with improvement

## 2023-07-05 ENCOUNTER — Other Ambulatory Visit (HOSPITAL_COMMUNITY): Payer: Self-pay

## 2023-07-05 DIAGNOSIS — J189 Pneumonia, unspecified organism: Secondary | ICD-10-CM | POA: Diagnosis present

## 2023-07-05 DIAGNOSIS — R066 Hiccough: Secondary | ICD-10-CM | POA: Diagnosis not present

## 2023-07-05 LAB — CBC
HCT: 35.7 % — ABNORMAL LOW (ref 39.0–52.0)
Hemoglobin: 12 g/dL — ABNORMAL LOW (ref 13.0–17.0)
MCH: 30.2 pg (ref 26.0–34.0)
MCHC: 33.6 g/dL (ref 30.0–36.0)
MCV: 89.9 fL (ref 80.0–100.0)
Platelets: 234 10*3/uL (ref 150–400)
RBC: 3.97 MIL/uL — ABNORMAL LOW (ref 4.22–5.81)
RDW: 14.1 % (ref 11.5–15.5)
WBC: 8.7 10*3/uL (ref 4.0–10.5)
nRBC: 0 % (ref 0.0–0.2)

## 2023-07-05 LAB — BASIC METABOLIC PANEL
Anion gap: 10 (ref 5–15)
BUN: 21 mg/dL (ref 8–23)
CO2: 23 mmol/L (ref 22–32)
Calcium: 8.6 mg/dL — ABNORMAL LOW (ref 8.9–10.3)
Chloride: 103 mmol/L (ref 98–111)
Creatinine, Ser: 1.25 mg/dL — ABNORMAL HIGH (ref 0.61–1.24)
GFR, Estimated: >60 mL/min (ref >60)
Glucose, Bld: 143 mg/dL — ABNORMAL HIGH (ref 70–99)
Potassium: 3.8 mmol/L (ref 3.5–5.1)
Sodium: 136 mmol/L (ref 135–145)

## 2023-07-05 MED ORDER — LORAZEPAM 0.5 MG PO TABS
0.5000 mg | ORAL_TABLET | Freq: Three times a day (TID) | ORAL | 0 refills | Status: DC | PRN
  Filled 2023-07-05: qty 20, 7d supply, fill #0

## 2023-07-05 NOTE — Discharge Instructions (Addendum)
 Dear Maxey JAYSON Na,  Thank you for letting us  participate in your care. You were hospitalized for uncontrollable hiccups and vomiting.  At this time we do not know the exact cause of your hiccups and vomiting, we recommend you follow-up with your PCP and consider seeing gastroenterology outpatient.  You were treated with IV Reglan , IV Thorazine .   POST-HOSPITAL & CARE INSTRUCTIONS Please take ativan  0.5 mg as needed, you can take it every 8 hours if needed. Please do not take this medication if you are not experiencing hiccups. If this does not resolve your hiccups within 24 hours, please discontinue this medication and call your doctor. We recommend you continue taking your protonix  until you can see your PCP. This medication helps with reflux. Please go see your PCP We recommend a referral to GI specialist Go to your follow up appointments (listed below)   DOCTOR'S APPOINTMENT   Future Appointments  Date Time Provider Department Center  05/09/2024  9:20 AM [facility] ANNUAL WELLNESS VISIT [facility] None     Take care and be well!  Family Medicine Teaching Service Inpatient Team [place]  [facility]  [address] [place], [place] [address] [phone]

## 2023-07-05 NOTE — Assessment & Plan Note (Deleted)
 Suspect in the setting of GERD vs viral gastroenteritis. Less likely MI or CNS cause given EKG with no new changes, negative trops, and no new neurological findings. S/p Thorazine  25mg  PO (could not tolerate), Reglan  10mg  IV, and Protonix  40mg  IV in the ED. Will continue to monitor sx overnight - Admitted to FMTS, Dr. McDiarmid attending - Thorazine  25mg  IV - continue home Pepcid  20mg  BID - Protonix  40mg  every day - POAL - regular diet - 1L NS bolus - U/A, TSH, am CBC, BMP - consider ativan  if sx do not improve as he has ?reportedly taken this in the past with improvement

## 2023-07-05 NOTE — Care Management Obs Status (Signed)
 MEDICARE OBSERVATION STATUS NOTIFICATION   Patient Details  Name: [name] MRN: [id number] Date of Birth: 08/21/1950   Medicare Observation Status Notification Given:  Yes    Darrold Span, RN 07/05/2023, 11:20 AM

## 2023-07-05 NOTE — Discharge Summary (Addendum)
 Family Medicine Teaching [facility] Discharge Summary  Patient name: [name] Medical record number: [id number] Date of birth: 02-Sep-1950 Age: 73 y.o. Gender: male Date of Admission: 07/04/2023  Date of Discharge: 07/05/23 Admitting Physician: Krystal BIRCH McDiarmid, MD  Primary Care Provider: Vicci Barnie NOVAK, MD Consultants: none  Indication for Hospitalization: intractable hiccups  Discharge Diagnoses/Problem List:  Principal Problem for Admission: intractable hiccups Other Problems addressed during stay:  Principal Problem:   Intractable hiccups Active Problems:   Vomiting   Pneumonia    Brief Hospital Course:  [name] is a 73 y.o.male with a history of T2DM, HTN, stroke, HLD, a fib who was admitted to the [facility] Medicine Teaching Service at [facility] for intractable hiccups. His hospital course is detailed below:  Intractable Hiccups In the ED he was given Protonix  and Reglan  with no improvement.  Attempted to give patient Thorazine  25 mg p.o but he vomited right after.  CMP, troponins, CBC unremarkable.  On the floor, he was continued on home pepcid , added protonix  40 mg daily, IV fluids and thorazine  25 mg IV with improvement. CTPE on 1/10 without PE - some incidental findings listed in follow up recs. On 1/13 his hiccups and nausea resolved. Patient reported ativan  is his preferred medication to prevent hiccups, and declined thorazine  PO. He was discharged with a small supply of ativan  for abortive treatment. If hiccups return and not resolved with ativan , we recommend not refilling ativan  prescriptions. Instead try PO thorazine , because we believe this improved his hiccups. Additionally, he would benefit from GI referral to look for potential esophageal cause. Continue PPI. At time of discharge patient was tolerating PO.   Other chronic conditions were medically managed with home medications and formulary alternatives as necessary.  PCP Follow-up  Recommendations: 1.6cm right thyroid  nodule - thyroid  US  6mm nodule LUL - non-contrast chest CT in 6-46mo Recommend GI follow-up for potential EGD for hiccups - ativan  small dose given at dsicharge    Disposition: home  Discharge Condition: stable  Discharge Exam:  Vitals:   07/04/23 2036 07/05/23 0619  BP: 125/68 (!) 144/77  Pulse: 84 94  Resp: 18 18  Temp: 98.2 F (36.8 C) 100.1 F (37.8 C)  SpO2: 96% 96%   General: well appearing, NAD Cardiovascular: RRR, no m/r/g Respiratory: normal work of breathing on RA, CTAB Abdomen: Normal bowel sounds, soft, non-tender Extremities: No swelling BLE  Significant Procedures: none  Significant Labs and Imaging:  Recent Labs  Lab 07/04/23 1128 07/05/23 0239  WBC 6.6 8.7  HGB 13.5 12.0*  HCT 40.8 35.7*  PLT 272 234   Recent Labs  Lab 07/04/23 1128 07/05/23 0239  NA 139 136  K 3.7 3.8  CL 104 103  CO2 24 23  GLUCOSE 143* 143*  BUN 17 21  CREATININE 1.21 1.25*  CALCIUM  8.9 8.6*  ALKPHOS 73  --   AST 14*  --   ALT 12  --   ALBUMIN  3.8  --    US  Abdomen Limited RUQ 07/04/23 IMPRESSION: Unremarkable right upper quadrant ultrasound.  Results/Tests Pending at Time of Discharge: none  Discharge Medications:  Allergies as of 07/05/2023       Reactions   Lisinopril  Swelling   Throat swelling   Tramadol  Hcl    Makes me crazy        Medication List     STOP taking these medications    amoxicillin -clavulanate 875-125 MG tablet Commonly known as: AUGMENTIN   TAKE these medications    amLODipine  10 MG tablet Commonly known as: NORVASC  Take 1 tablet (10 mg total) by mouth daily.   diclofenac  Sodium 1 % Gel Commonly known as: Voltaren  Apply 2 g topically 3 (three) times daily as needed (pain).   dicyclomine  20 MG tablet Commonly known as: BENTYL  Take 1 tablet (20 mg total) by mouth 4 (four) times daily as needed (intestinal cramps).   Eliquis  5 MG Tabs tablet Generic drug: apixaban  Take 1  tablet (5 mg total) by mouth 2 (two) times daily.   famotidine  20 MG tablet Commonly known as: PEPCID  Take 1 tablet (20 mg total) by mouth 2 (two) times daily.   fluticasone  50 MCG/ACT nasal spray Commonly known as: FLONASE  Place 1 spray into both nostrils at bedtime x 1 week then use as needed   glimepiride  1 MG tablet Commonly known as: AMARYL  Take 1 tablet (1 mg total) by mouth daily with breakfast.   hydrOXYzine  25 MG capsule Commonly known as: VISTARIL  Take 1 capsule (25 mg total) by mouth 2 (two) times daily as needed.   latanoprost  0.005 % ophthalmic solution Commonly known as: XALATAN  Place 1 drop into both eyes nightly.   loratadine  10 MG tablet Commonly known as: CLARITIN  Take 1 tablet (10 mg total) by mouth daily as needed for allergies.   LORazepam  0.5 MG tablet Commonly known as: Ativan  Take 1 tablet (0.5 mg total) by mouth every 8 (eight) hours as needed (hiccups).   metoCLOPramide  10 MG tablet Commonly known as: REGLAN  Take 1 tablet (10 mg total) by mouth every 6 (six) hours.   multivitamin with minerals Tabs tablet Take 1 tablet by mouth daily.   omeprazole  20 MG capsule Commonly known as: PRILOSEC Take 1 capsule (20 mg total) by mouth daily.   rosuvastatin  5 MG tablet Commonly known as: Crestor  Take 1 tablet (5 mg total) by mouth daily. STOP Pravastatin .  Pt wants to be notified when ready for pick up   Vitamin B Complex  Tabs Take 1 tablet by mouth daily.        Discharge Instructions: Please refer to Patient Instructions section of EMR for full details.  Patient was counseled important signs and symptoms that should prompt return to medical care, changes in medications, dietary instructions, activity restrictions, and follow up appointments.   Follow-Up Appointments:  Follow-up Information     Vicci Barnie NOVAK, MD Follow up.   Specialty: Internal Medicine Contact information: [address] [place]  [address] [phone]                 Stoney Blizzard, DO 07/05/2023, 12:11 PM PGY-1, [facility]   Upper Level Addendum:  I have seen and evaluated this patient along with Dr. Stoney and reviewed the above note, making necessary revisions as appropriate.  I agree with the medical decision making and physical exam as noted above.  Wendel Lesch, MD PGY-3 [facility] Family Medicine Res[facility]

## 2023-07-05 NOTE — Assessment & Plan Note (Deleted)
 Suspect in the setting of viral gastroenteritis after eating [employer] 1/10. US  RUQ reassuringly negative for acute findings. S/p Reglan  10mg  IV in the ED.  - CTM sx - Thorazine  25mg  IV for hiccups and vomiting - consider GI consult if vomiting persists despite antiemetics

## 2023-07-05 NOTE — Care Management CC44 (Signed)
 Condition Code 44 Documentation Completed  Patient Details  Name: [name] MRN: [id number] Date of Birth: [birth year]   Condition Code 44 given:  Yes Patient signature on Condition Code 44 notice:  Yes Documentation of 2 MD's agreement:  Yes Code 44 added to claim:  Yes    Charlann Rayfield Hurst, RN 07/05/2023, 11:20 AM

## 2023-07-05 NOTE — Progress Notes (Signed)
 Patient discharged to home, AVS reviewed, patient verbalized understanding. Transport to take patient to [facility] pharmacy and then discharge lounge. Wife to provide transportation

## 2023-07-06 ENCOUNTER — Telehealth: Payer: Self-pay

## 2023-07-06 ENCOUNTER — Telehealth: Payer: Self-pay | Admitting: Internal Medicine

## 2023-07-06 NOTE — Transitions of Care (Post Inpatient/ED Visit) (Signed)
   07/06/2023  Name: [name] MRN: [id number] DOB: 1951/05/12  Today's TOC FU Call Status: Today's TOC FU Call Status:: Successful TOC FU Call Completed TOC FU Call Complete Date: 07/06/23 Patient's Name and Date of Birth confirmed.  Transition Care Management Follow-up Telephone Call Date of Discharge: 07/05/23 Discharge Facility: [facility]) Type of Discharge: Inpatient Admission Primary Inpatient Discharge Diagnosis:: intractable hiccups How have you been since you were released from the hospital?: Better (He said he is just very tired because he has not slept in 6 days) Any questions or concerns?: No (He said he will discuss his questions with his provider when he sees her.  He did not have any questions for her now.)  Items Reviewed: Did you receive and understand the discharge instructions provided?: Yes Medications obtained,verified, and reconciled?: Partial Review Completed Reason for Partial Mediation Review: He said he has all of his medications and did not have any questions about the med regime and he said he did not need to review the med list Any new allergies since your discharge?: No Dietary orders reviewed?: Yes Type of Diet Ordered:: heart healthy, low sodium Do you have support at home?: Yes Name of Support/Comfort Primary Source: He said he is not alone and has help but did not specify who provides the help  Medications Reviewed Today: Medications Reviewed Today   Medications were not reviewed in this encounter     Home Care and Equipment/Supplies: Were Home Health Services Ordered?: No Any new equipment or medical supplies ordered?: No  Functional Questionnaire: Do you need assistance with bathing/showering or dressing?: No Do you need assistance with meal preparation?: No Do you need assistance with eating?: No Do you have difficulty maintaining continence: No Do you need assistance with getting out of bed/getting out of a chair/moving?: Yes  (He ambulates with a cane) Do you have difficulty managing or taking your medications?: No  Follow up appointments reviewed: PCP Follow-up appointment confirmed?: Yes Date of PCP follow-up appointment?: 07/27/23 Follow-up Provider: Dr Vicci.  I offered to schedule him with another provider and there was not a provider available at [facility] or [facility] before 07/24/2023.  He  then said that he only can have an appointment in the afternoons, so he wanted the appointment with Dr Vicci on 07/27/2023.  He refused the [facility]. Specialist Hospital Follow-up appointment confirmed?: NA Do you need transportation to your follow-up appointment?: No Do you understand care options if your condition(s) worsen?: Yes-patient verbalized understanding    SIGNATURE Scott Diesel, RN

## 2023-07-06 NOTE — Telephone Encounter (Signed)
 Jane: Pt needs transition of care visit. Arna Medici: could you please give update on his ENT referral? I sent you an inbox message informing you that patient has not received any appointment from Dr. Cherlyn Labella office as yet where he was referred to.

## 2023-07-07 ENCOUNTER — Encounter: Payer: Self-pay | Admitting: Internal Medicine

## 2023-07-12 ENCOUNTER — Other Ambulatory Visit: Payer: Self-pay

## 2023-07-12 ENCOUNTER — Ambulatory Visit: Payer: Self-pay | Admitting: *Deleted

## 2023-07-12 NOTE — Telephone Encounter (Signed)
Summary: Pt has questions about his medications   Pt has questions about his medications and what he is suppose to be taking. Pt requests call back to advise. Cb# [phone]         Reason for Disposition . Caller has medicine question only, adult not sick, AND triager answers question  Answer Assessment - Initial Assessment Questions 1. NAME of MEDICINE: "What medicine(s) are you calling about?"     Pravastatin  2. QUESTION: "What is your question?" (e.g., double dose of medicine, side effect)     Patient would like to know what cholesterol medication he is to be taking 3. PRESCRIBER: "Who prescribed the medicine?" Reason: if prescribed by specialist, call should be referred to that group.     PCP  Per chart medication list:   rosuvastatin (CRESTOR) 5 MG tablet Take 1 tablet (5 mg total) by mouth daily. STOP Pravastatin. Pt wants to be notified when ready for pick up Dispense: 90 tablet, Refills: 1 of 1 remaining   05/18/2023  Protocols used: Medication Question Call-A-AH

## 2023-07-12 NOTE — Telephone Encounter (Signed)
Noted  

## 2023-07-12 NOTE — Telephone Encounter (Signed)
  Chief Complaint: Patient wants to know if he should be taking Pravastatin   Disposition: [] ED /[] Urgent Care (no appt availability in office) / [] Appointment(In office/virtual)/ []  [place] [facility]/ [] Home Care/ [] Refused Recommended Disposition /[] [facility]/ [x]  Follow-up with PCP Additional Notes: Per chart review and medication list review: Pravastatin is not listed only - Rosuvastatin with notes to stop pravastatin

## 2023-07-13 NOTE — Telephone Encounter (Signed)
Call placed to patient to inform him of the ENT appointment.  Message left with call back requested

## 2023-07-14 ENCOUNTER — Other Ambulatory Visit: Payer: Self-pay

## 2023-07-15 ENCOUNTER — Other Ambulatory Visit (HOSPITAL_COMMUNITY): Payer: Self-pay

## 2023-07-15 ENCOUNTER — Other Ambulatory Visit: Payer: Self-pay

## 2023-07-16 ENCOUNTER — Other Ambulatory Visit: Payer: Self-pay | Admitting: Internal Medicine

## 2023-07-16 MED ORDER — NEBIVOLOL HCL 2.5 MG PO TABS
2.5000 mg | ORAL_TABLET | Freq: Every day | ORAL | 3 refills | Status: DC
  Filled 2023-07-16: qty 90, 90d supply, fill #0
  Filled 2024-03-31: qty 90, 90d supply, fill #1
  Filled 2024-06-12: qty 90, 90d supply, fill #2

## 2023-07-19 ENCOUNTER — Other Ambulatory Visit: Payer: Self-pay

## 2023-07-22 ENCOUNTER — Other Ambulatory Visit: Payer: Self-pay

## 2023-07-22 NOTE — Telephone Encounter (Signed)
I tried to reach the patient again and had to leave a message requesting a call back. I want to inform him that his appointment with [facility] is 08/25/2023 @ 2:00 pm.

## 2023-07-27 ENCOUNTER — Ambulatory Visit: Payer: Medicare HMO | Attending: Internal Medicine | Admitting: Internal Medicine

## 2023-07-27 ENCOUNTER — Other Ambulatory Visit: Payer: Self-pay

## 2023-07-27 VITALS — BP 122/76 | HR 83 | Ht 73.0 in | Wt 192.0 lb

## 2023-07-27 DIAGNOSIS — F17290 Nicotine dependence, other tobacco product, uncomplicated: Secondary | ICD-10-CM | POA: Diagnosis not present

## 2023-07-27 DIAGNOSIS — E119 Type 2 diabetes mellitus without complications: Secondary | ICD-10-CM

## 2023-07-27 DIAGNOSIS — F4329 Adjustment disorder with other symptoms: Secondary | ICD-10-CM

## 2023-07-27 DIAGNOSIS — I480 Paroxysmal atrial fibrillation: Secondary | ICD-10-CM

## 2023-07-27 DIAGNOSIS — T23221A Burn of second degree of single right finger (nail) except thumb, initial encounter: Secondary | ICD-10-CM | POA: Diagnosis not present

## 2023-07-27 DIAGNOSIS — E1149 Type 2 diabetes mellitus with other diabetic neurological complication: Secondary | ICD-10-CM

## 2023-07-27 DIAGNOSIS — E1159 Type 2 diabetes mellitus with other circulatory complications: Secondary | ICD-10-CM | POA: Diagnosis not present

## 2023-07-27 DIAGNOSIS — I152 Hypertension secondary to endocrine disorders: Secondary | ICD-10-CM | POA: Diagnosis not present

## 2023-07-27 DIAGNOSIS — E041 Nontoxic single thyroid nodule: Secondary | ICD-10-CM | POA: Diagnosis not present

## 2023-07-27 DIAGNOSIS — Z7984 Long term (current) use of oral hypoglycemic drugs: Secondary | ICD-10-CM

## 2023-07-27 DIAGNOSIS — R911 Solitary pulmonary nodule: Secondary | ICD-10-CM

## 2023-07-27 DIAGNOSIS — Z09 Encounter for follow-up examination after completed treatment for conditions other than malignant neoplasm: Secondary | ICD-10-CM

## 2023-07-27 MED ORDER — SILVER SULFADIAZINE 1 % EX CREA
1.0000 | TOPICAL_CREAM | Freq: Every day | CUTANEOUS | 0 refills | Status: AC
  Filled 2023-07-27: qty 50, 30d supply, fill #0

## 2023-07-27 NOTE — Progress Notes (Signed)
 Patient ID: [name], male    DOB: Sep 04, 1950  MRN: [id number]  CC: Hospitalization Follow-up (Hospitalization f/u. [facility] Ativan /Numbness / tingling on fingers - episode of burning fingers without realizing /Already received flu vax)   Subjective: [name] is a 73 y.o. male who presents for hosp f/u and chronic ds management. 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     Patient hospitalized 1/12-13/2025 with intractable hiccups.  He was treated with IV fluids and IV Thorazine  with improvement.  According to discharge summary, patient referred Ativan  instead of p.o. Thorazine  to prevent hiccups.  He was prescribed small quantity on discharge.  It was noted that CTA of the chest that he had done earlier in the month through the emergency room revealed incidental findings of a 1.6 cm right thyroid  nodule and 6 mm LUL nodule.  Today: He reports that he has been doing well since hospitalization.  C/o tingling and numbness at tip of his fingers x several mths; intermittent. Gabapentin  helps.  Taking Gabapentin  twice a day which he states is being prescribed by his neurosurgeon; does not recall the dose but will call back with this information.  Burn the index finger on the right hand a few days ago while handling a hot pot.  Reports being called by case worker with [facility] recently.  After speaking with her, she states she recommended a referral to behavioral health.  Patient has been struggling with relationship conflict with his church minister and other members asking him to do things that he is not comfortable with based on his faith beliefs.  Also continues to work on relationship with his wife. He was prescribed Ativan  0.5 mg #20 on discharge from recent hospitalization for hiccups.   However patient finds it helpful in calming him down and dealing with the interpersonal relationship conflicts that he is currently experiencing.  Still has 15 left and would like to continue to keep on hand and use as needed. Wondering about referral to behavioral health for counseling.  HTN/PAF: Patient had sent me a MyChart message recently informing that his blood pressure was elevated despite taking the amlodipine  every day.  We added nebivolol  2.5 mg daily.  He has been taking both consistently.  Reports blood pressure has been good since the addition of the nebivolol .   -He continues on Eliquis  for A-fib.  No bruising or bleeding.  Recent CBC normal.  DM: Checking blood sugars every 3 days.  Reports blood sugar readings in the low 100s.  Compliant with Amaryl  1 mg daily.  He has an appointment with ENT next month for evaluation of persistent sinus drainage.  Went over CTA of the chest report that was done about a month ago.  It revealed right thyroid  nodule.  Recent PSA was normal.  We will need to order thyroid  ultrasound.  Incidental finding of 6 mm nodule LUL.  Radiologist recommended noncontrast CT in 6 to 12 months.  He continues to smoke a pipe several times a week.    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   Unintended weight loss 01/29/2020   Pain due to onychomycosis of toenails of both feet 08/01/2019   Porokeratosis 08/01/2019   Influenza vaccine  needed 02/24/2019   Diabetes mellitus (HCC) 02/24/2019   Hyperlipemia 11/24/2016   History of CVA with residual deficit 11/24/2016   GERD (gastroesophageal reflux disease) 08/09/2014   Atrial fibrillation (HCC)    Hemiparesis affecting left side as late effect of stroke (HCC) 05/01/2014   ERECTILE DYSFUNCTION, 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 90 tablet 1   apixaban  (ELIQUIS ) 5 MG TABS tablet Take 1 tablet (5 mg total) by mouth 2 (two) times daily. 180 tablet 1   B Complex Vitamins (VITAMIN B COMPLEX ) TABS Take 1 tablet by mouth daily.     diclofenac  Sodium (VOLTAREN ) 1 % GEL Apply 2 g topically 3 (three) times daily as needed (pain). 100 g 0   dicyclomine  (BENTYL ) 20 MG tablet Take 1 tablet (20 mg total) by mouth 4 (four) times daily as needed (intestinal cramps). 40 tablet 0   famotidine  (PEPCID ) 20 MG tablet Take 1 tablet (20 mg total) by mouth 2 (two) times daily. 30 tablet 0   fluticasone  (FLONASE ) 50 MCG/ACT nasal spray Place 1 spray into both nostrils at bedtime x 1 week then use as needed 16 g 1   glimepiride  (AMARYL ) 1 MG tablet Take 1 tablet (1 mg total) by mouth daily with breakfast. 90 tablet 1   hydrOXYzine  (VISTARIL ) 25 MG capsule Take 1 capsule (25 mg total) by mouth 2 (two) times daily as needed. 60 capsule 1   latanoprost  (XALATAN ) 0.005 % ophthalmic solution Place 1 drop into both eyes nightly. 7.5 mL 1   loratadine  (CLARITIN ) 10 MG tablet Take 1 tablet (10 mg total) by mouth daily as needed for allergies. 60 tablet 1   LORazepam  (ATIVAN ) 0.5 MG tablet Take 1 tablet (0.5 mg total) by mouth every 8 (eight) hours as  needed (hiccups). 20 tablet 0   metoCLOPramide  (REGLAN ) 10 MG tablet Take 1 tablet (10 mg total) by mouth every 6 (six) hours. 30 tablet 0   Multiple Vitamin (MULTIVITAMIN WITH MINERALS) TABS tablet Take 1 tablet by mouth daily.     nebivolol  (BYSTOLIC ) 2.5 MG tablet Take 1 tablet (2.5 mg total) by mouth daily. 90 tablet 3   omeprazole  (PRILOSEC) 20 MG capsule Take 1 capsule (20 mg total) by mouth daily. 30 capsule 3   rosuvastatin  (CRESTOR ) 5 MG tablet Take 1 tablet (5 mg total) by mouth daily. STOP Pravastatin .  Pt wants to be notified when ready for pick up 90 tablet 1   No current facility-administered medications on file prior to visit.    Allergies  Allergen Reactions   Lisinopril  Swelling    Throat swelling   Tramadol  Hcl     Makes me crazy    Social History   Socioeconomic History   Marital status:  Married    Spouse name: Not on file   Number of children: 5   Years of education: Not on file   Highest education level: Not on file  Occupational History   Occupation: Caregiver    Comment: retired  Tobacco Use   Smoking status: Some Days    Types: Pipe   Smokeless tobacco: Never   Tobacco comments:    Quit 05/15/14  Vaping Use   Vaping status: Never Used  Substance and Sexual Activity   Alcohol use: Yes    Alcohol/week: 1.0 - 2.0 standard drink of alcohol    Types: 1 - 2 Standard drinks or equivalent per week    Comment: occasional wine   Drug use: No   Sexual activity: Not Currently  Other Topics Concern   Not on file  Social History Narrative   Lives with his wife   5 children, grown.   7 grandchildren.    Social Drivers of Health   Financial Resource Strain: Medium Risk (05/04/2023)   Overall Financial Resource Strain (CARDIA)    Difficulty of Paying Living Expenses: Somewhat hard  Food Insecurity: No Food Insecurity (07/04/2023)   Hunger Vital Sign    Worried About Running Out of Food in the Last Year: Never true    Ran Out of Food in the Last Year:  Never true  Recent Concern: Food Insecurity - Food Insecurity Present (05/04/2023)   Hunger Vital Sign    Worried About Running Out of Food in the Last Year: Sometimes true    Ran Out of Food in the Last Year: Sometimes true  Transportation Needs: No Transportation Needs (07/04/2023)   PRAPARE - Administrator, Civil Service (Medical): No    Lack of Transportation (Non-Medical): No  Recent Concern: Transportation Needs - Unmet Transportation Needs (04/05/2023)   PRAPARE - Transportation    Lack of Transportation (Medical): Yes    Lack of Transportation (Non-Medical): Yes  Physical Activity: Insufficiently Active (05/04/2023)   Exercise Vital Sign    Days of Exercise per Week: 3 days    Minutes of Exercise per Session: 30 min  Stress: No Stress Concern Present (05/04/2023)   [employer] of Occupational Health - Occupational Stress Questionnaire    Feeling of Stress : Not at all  Social Connections: Moderately Integrated (07/04/2023)   Social Connection and Isolation Panel [NHANES]    Frequency of Communication with Friends and Family: More than three times a week    Frequency of Social Gatherings with Friends and Family: More than three times a week    Attends Religious Services: More than 4 times per year    Active Member of [employer] or Organizations: Yes    Attends Banker Meetings: More than 4 times per year    Marital Status: Separated  Intimate Partner Violence: Not At Risk (07/04/2023)   Humiliation, Afraid, Rape, and Kick questionnaire    Fear of Current or Ex-Partner: No    Emotionally Abused: No    Physically Abused: No    Sexually Abused: No    Family History  Problem Relation Age of Onset   Diabetes Mother    Breast cancer Mother    Diabetes Father    Breast cancer Sister    Prostate cancer Neg Hx    Colon cancer Neg Hx    Pancreatic cancer Neg Hx     Past Surgical History:  Procedure Laterality Date   CATARACT EXTRACTION  06/2017    CYSTOSCOPY  05/2017   INTERCOSTAL NERVE BLOCK Left 05/13/2020   Procedure: INTERCOSTAL NERVE BLOCK;  Surgeon: Shyrl Linnie KIDD, MD;  Location: [facility] OR;  Service: Thoracic;  Laterality: Left;   LUMBAR LAMINECTOMY/DECOMPRESSION MICRODISCECTOMY Right 03/11/2021   Procedure: Microdiscectomy - right - Lumbar four-Lumbar five extraforaminal;  Surgeon: Louis Shove, MD;  Location: [facility] OR;  Service: Neurosurgery;  Laterality: Right;   LUMBAR LAMINECTOMY/DECOMPRESSION MICRODISCECTOMY Left 12/29/2021   Procedure: Extraforaminal microdiscectomy - left - L4-L5;  Surgeon: Louis Shove, MD;  Location: [facility] OR;  Service: Neurosurgery;  Laterality: Left;  3C   PROSTATE BIOPSY     WEDGE RESECTION Left 05/13/2020   XI robotic assisted thorascopy    ROS: Review of Systems Negative except as stated above  PHYSICAL EXAM: BP 122/76 (BP Location: Left Arm, Patient Position: Sitting, Cuff Size: Normal)   Pulse 83   Ht 6' 1 (1.854 m)   Wt 192 lb (87.1 kg)   SpO2 96%   BMI 25.33 kg/m   Wt Readings from Last 3 Encounters:  07/27/23 192 lb (87.1 kg)  07/04/23 191 lb 12.8 oz (87 kg)  07/02/23 193 lb (87.5 kg)    Physical Exam   General appearance - alert, well appearing, elderly AAM and in no distress Mental status -patient is alert and oriented.  He answers questions appropriately. Chest - clear to auscultation, no wheezes, rales or rhonchi, symmetric air entry Heart - normal rate, regular rhythm, normal S1, S2, no murmurs, rubs, clicks or gallops Extremities - peripheral pulses normal, no pedal edema, no clubbing or cyanosis Skin -right index finger: Band-Aid was removed.  Patient has second-degree burn with pink healthy tissue noted on the palmar tip of the right index finger.  Area is about 2 cm in size. MSK: Patient ambulates with a cane.    07/27/2023    2:45 PM 05/04/2023   10:16 AM 04/05/2023    2:30 PM  Depression screen PHQ 2/9  Decreased Interest 0 0 0  Down, Depressed, Hopeless 0 0 0  PHQ -  2 Score 0 0 0  Altered sleeping 0 0 0  Tired, decreased energy 0 1 1  Change in appetite 0 0 0  Feeling bad or failure about yourself  0 0 0  Trouble concentrating 0 1 1  Moving slowly or fidgety/restless 0 0 0  Suicidal thoughts 0 0 0  PHQ-9 Score 0 2 2  Difficult doing work/chores Not difficult at all Not difficult at all Somewhat difficult      07/27/2023    2:45 PM 04/05/2023    2:31 PM 07/10/2022    4:42 PM 12/26/2021    2:47 PM  GAD 7 : Generalized Anxiety Score  Nervous, Anxious, on Edge 0 0 0 0  Control/stop worrying 0 0 0 0  Worry too much - different things 0 0 0 0  Trouble relaxing 0 1 0 0  Restless 0 0 0 0  Easily annoyed or irritable 0 0 0 0  Afraid - awful might happen 0 0 0 0  Total GAD 7 Score 0 1 0 0  Anxiety Difficulty Not difficult at all Not difficult at all          Latest Ref Rng & Units 07/05/2023    2:39 AM 07/04/2023   11:28 AM 07/02/2023    9:52 AM  CMP  Glucose 70 - 99 mg/dL 856  856    BUN 8 - 23 mg/dL 21  17    Creatinine 9.38 - 1.24  mg/dL 8.74  8.78    Sodium 864 - 145 mmol/L 136  139  139   Potassium 3.5 - 5.1 mmol/L 3.8  3.7  3.9   Chloride 98 - 111 mmol/L 103  104    CO2 22 - 32 mmol/L 23  24    Calcium  8.9 - 10.3 mg/dL 8.6  8.9    Total Protein 6.5 - 8.1 g/dL  7.3    Total Bilirubin 0.0 - 1.2 mg/dL  0.8    Alkaline Phos 38 - 126 U/L  73    AST 15 - 41 U/L  14    ALT 0 - 44 U/L  12     Lipid Panel     Component Value Date/Time   CHOL 201 (H) 05/13/2023 1537   TRIG 208 (H) 05/13/2023 1537   HDL 47 05/13/2023 1537   CHOLHDL 4.3 05/13/2023 1537   CHOLHDL 4.2 05/02/2014 0000   VLDL 72 (H) 05/02/2014 0000   LDLCALC 118 (H) 05/13/2023 1537    CBC    Component Value Date/Time   WBC 8.7 07/05/2023 0239   RBC 3.97 (L) 07/05/2023 0239   HGB 12.0 (L) 07/05/2023 0239   HGB 13.5 01/07/2023 1424   HCT 35.7 (L) 07/05/2023 0239   HCT 38.2 01/07/2023 1424   PLT 234 07/05/2023 0239   PLT 244 01/07/2023 1424   MCV 89.9 07/05/2023 0239    MCV 87 01/07/2023 1424   MCH 30.2 07/05/2023 0239   MCHC 33.6 07/05/2023 0239   RDW 14.1 07/05/2023 0239   RDW 13.4 01/07/2023 1424   LYMPHSABS 1.0 07/02/2023 0945   MONOABS 0.5 07/02/2023 0945   EOSABS 0.0 07/02/2023 0945   BASOSABS 0.0 07/02/2023 0945    ASSESSMENT AND PLAN: 1. Hospital discharge follow-up (Primary)   2. 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   Advised patient to clean the area once a day and apply Silvadene  cream and cover with a gauze.  Advised to be careful when handling hot objects. - silver  sulfADIAZINE  (SILVADENE ) 1 % cream; Apply 1 Application topically daily.  Dispense: 50 g; Refill: 0  3. Stress and adjustment reaction Patient seemed uncomfortable in talking about the conflict he is having with his minister and some members of his church and about his relationship with his wife.  He is agreeable to seeing a veterinary surgeon.  In regards to continued use of Ativan , advised patient that I do not prescribe this medicine long-term because of potential for it to be habit-forming.  Also can increase risk of falls in elderly patients.  Advised to be careful with using this in combination with oxycodone  which he indicates he has at home (prescribed by Dr. Malcolm after his last back surgery) and uses only when he has a flareup of his back pain.  He is agreeable to seeing a psychiatrist - Ambulatory referral to Psychiatry  4. 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  See #2 above.  He tells me that gabapentin  helps and he takes it twice a day prescribed by Dr. Malcolm.  Advised that he calls me back with the dose so that I can add this to his medication list. Continue Amaryl  for his diabetes.  5. Diabetes mellitus  treated with oral medication (HCC) See #4 above  6. Incidental lung nodule, > 3mm and < 8mm Will plan to repeat CT of the chest around June of this year.  Strongly advised to quit use of all tobacco products.  He he continues to smoke a pipe several days a week.  7. Smokes a pipe See #6 above.  8. Thyroid  nodule - US  THYROID ; Future  9. PAF (paroxysmal atrial fibrillation) (HCC) Continue Eliquis .  10. Hypertension associated with type 2 diabetes mellitus (HCC) Controlled.  Continue amlodipine  10 mg daily and nebivolol  2.5 mg daily.    Patient was given the opportunity to ask questions.  Patient verbalized understanding of the plan and was able to repeat key elements of the plan.   This documentation was completed using Paediatric nurse.  Any transcriptional errors are unintentional.  Orders Placed This Encounter  Procedures   US  THYROID    Ambulatory referral to Psychiatry     Requested Prescriptions   Signed Prescriptions Disp Refills   silver  sulfADIAZINE  (SILVADENE ) 1 % cream 50 g 0    Sig: Apply 1 Application topically daily.    Return in about 4 months (around 11/24/2023).  Barnie Louder, MD, FACP

## 2023-07-28 ENCOUNTER — Telehealth: Payer: Self-pay

## 2023-07-28 ENCOUNTER — Other Ambulatory Visit: Payer: Self-pay

## 2023-07-28 ENCOUNTER — Encounter: Payer: Self-pay | Admitting: Internal Medicine

## 2023-07-28 NOTE — Telephone Encounter (Signed)
 Copied from CRM [id number]. Topic: Clinical - Medical Advice >> Jul 28, 2023  1:00 PM [facility] wrote: Reason for CRM: patient asking what to wash finger with before putting on silvadene , [phone]

## 2023-07-28 NOTE — Telephone Encounter (Signed)
 Spoke with patient patient voiced he cleaned the burn to her finger with soap an water and has applied silvadene  and has finger during a lot better.

## 2023-07-30 ENCOUNTER — Ambulatory Visit
Admission: RE | Admit: 2023-07-30 | Discharge: 2023-07-30 | Disposition: A | Discharge status: Home / Self Care | Payer: Medicare HMO | Source: Ambulatory Visit | Attending: Internal Medicine | Admitting: Internal Medicine

## 2023-07-30 DIAGNOSIS — R221 Localized swelling, mass and lump, neck: Secondary | ICD-10-CM | POA: Diagnosis not present

## 2023-07-30 DIAGNOSIS — E041 Nontoxic single thyroid nodule: Secondary | ICD-10-CM | POA: Diagnosis not present

## 2023-08-04 ENCOUNTER — Telehealth: Payer: Self-pay

## 2023-08-04 ENCOUNTER — Telehealth: Payer: Self-pay | Admitting: Internal Medicine

## 2023-08-04 DIAGNOSIS — R9389 Abnormal findings on diagnostic imaging of other specified body structures: Secondary | ICD-10-CM

## 2023-08-04 NOTE — Telephone Encounter (Signed)
Awaiting provider interpretation of [place] results

## 2023-08-04 NOTE — Telephone Encounter (Signed)
Copied from CRM [id number]. Topic: Clinical - Lab/Test Results >> Aug 04, 2023  9:27 AM Gaetano Hawthorne wrote: Reason for CRM: Patient is calling in regards to his most recent Ultrasound results - Please give him a call when you have a moment.

## 2023-08-04 NOTE — Telephone Encounter (Signed)
Phone call placed to patient today to go over the results of his thyroid ultrasound.  Patient informed that the lesion that was seen in the right thyroid is an incidental finding on CTA of the chest was seen on the thyroid ultrasound.  It was a spongiform nodule that does not meet criteria for FNA or surveillance. However another lesion was seen in the posterior aspect of the right thyroid gland.  Radiologist thinks this may be a mass in the parathyroid gland.  I explained to him what the parathyroid glands and the function in the body.  Advised patient that I would like for him to come to the lab for [place] to check parathyroid hormone level to see whether it is normal or elevated then I will order parathyroid nuclear scan as recommended by the radiologist.  He does not feel any thing abnormal in the neck, no compressive symptoms. Patient expressed understanding of plan.  All questions answered.

## 2023-08-09 ENCOUNTER — Ambulatory Visit: Payer: Medicare HMO | Attending: Nurse Practitioner

## 2023-08-09 DIAGNOSIS — R9389 Abnormal findings on diagnostic imaging of other specified body structures: Secondary | ICD-10-CM | POA: Diagnosis not present

## 2023-08-10 LAB — PTH, INTACT AND CALCIUM
Calcium: 9.2 mg/dL (ref 8.6–10.2)
PTH: 28 pg/mL (ref 15–65)

## 2023-08-11 ENCOUNTER — Telehealth: Payer: Self-pay | Admitting: Internal Medicine

## 2023-08-11 ENCOUNTER — Encounter: Payer: Self-pay | Admitting: Internal Medicine

## 2023-08-11 ENCOUNTER — Other Ambulatory Visit: Payer: Self-pay | Admitting: Internal Medicine

## 2023-08-11 DIAGNOSIS — R9389 Abnormal findings on diagnostic imaging of other specified body structures: Secondary | ICD-10-CM

## 2023-08-11 NOTE — Telephone Encounter (Signed)
 Called and spoke with Dr. Bryn Gulling who read the patient's thyroid ultrasound.  I informed him that patient's thyroid hormone level is normal and calcium level is normal.  Needed some guidance as to whether parathyroid scan is still recommended.  He advised that it still be done to see if indeed there is a mass in the parathyroid gland.  If there is, patient will need referral to an endocrine surgeon.  Clarisa: Please schedule pt's parathyroid scan at [facility] and call pt with date and time.

## 2023-08-12 ENCOUNTER — Telehealth: Payer: Self-pay | Admitting: Internal Medicine

## 2023-08-12 NOTE — Telephone Encounter (Signed)
 Parathyroid scan scheduled for 08/17/2023 at [facility] starting at 10:30 a.m.   No prior authorization required per Hollace Hayward at [facility] Ref #: [id number] C.A.  Called & spoke to the patient. Verified name & DOB. Informed that the parathyroid scan has been scheduled for 08/17/2023 at 10:30. Patient expressed verbal understanding of all discussed. No further questions at this time.

## 2023-08-12 NOTE — Telephone Encounter (Signed)
 No call was placed by a CMA. Provider spoke with [name] on 08/10/2022. All questions resolved at that time.

## 2023-08-12 NOTE — Telephone Encounter (Signed)
 Pt returning a call from cma

## 2023-08-17 ENCOUNTER — Encounter (HOSPITAL_COMMUNITY)
Admission: RE | Admit: 2023-08-17 | Discharge: 2023-08-17 | Disposition: A | Discharge status: Home / Self Care | Payer: Medicare HMO | Source: Ambulatory Visit | Attending: Internal Medicine | Admitting: Internal Medicine

## 2023-08-17 DIAGNOSIS — R9389 Abnormal findings on diagnostic imaging of other specified body structures: Secondary | ICD-10-CM | POA: Insufficient documentation

## 2023-08-17 DIAGNOSIS — E041 Nontoxic single thyroid nodule: Secondary | ICD-10-CM | POA: Diagnosis not present

## 2023-08-17 MED ORDER — TECHNETIUM TC 99M SESTAMIBI - CARDIOLITE
25.0000 | Freq: Once | INTRAVENOUS | Status: AC | PRN
  Administered 2023-08-17: 25 via INTRAVENOUS

## 2023-08-18 ENCOUNTER — Institutional Professional Consult (permissible substitution) (INDEPENDENT_AMBULATORY_CARE_PROVIDER_SITE_OTHER): Payer: Medicare HMO

## 2023-08-19 ENCOUNTER — Encounter: Payer: Self-pay | Admitting: Internal Medicine

## 2023-08-23 ENCOUNTER — Ambulatory Visit: Payer: Self-pay | Admitting: Internal Medicine

## 2023-08-23 NOTE — Telephone Encounter (Signed)
 Pt calling requesting results of recent parathyroid imaging, this RN advised results have not yet been released. Routing to provider for review/follow up.  Copied from CRM [id number]. Topic: Clinical - Lab/Test Results >> Aug 23, 2023  9:13 AM Marland Kitchen D wrote: Reason for CRM: Patient called wanting a update on lab results. He can be contacted through MyChart Reason for Disposition  Caller requesting lab results  (Exception: Routine or non-urgent lab result.)  Answer Assessment - Initial Assessment Questions 1. REASON FOR CALL or QUESTION: "What is your reason for calling today?" or "How can I best help you?" or "What question do you have that I can help answer?"     Pt requesting lab results from recent imaging of parathyroid 2. CALLER: Document the source of call. (e.g., laboratory, patient).     Patient  Protocols used: PCP Call - No Triage-A-AH

## 2023-08-23 NOTE — Telephone Encounter (Signed)
 Can one of you please call radiology reading room and request that pt's parathyroid nuclear med study be read as soon as possible.  Pt has contacted me twice already looking for results.

## 2023-08-24 ENCOUNTER — Telehealth (INDEPENDENT_AMBULATORY_CARE_PROVIDER_SITE_OTHER): Payer: Self-pay | Admitting: Otolaryngology

## 2023-08-24 NOTE — Telephone Encounter (Signed)
 Confirmed appt and location with wife for 08/25/2023.

## 2023-08-24 NOTE — Telephone Encounter (Signed)
 Called and spoke to radiology to request a reading asap. Representaitive put in that request.

## 2023-08-25 ENCOUNTER — Telehealth: Payer: Self-pay | Admitting: Internal Medicine

## 2023-08-25 ENCOUNTER — Encounter: Payer: Self-pay | Admitting: Internal Medicine

## 2023-08-25 ENCOUNTER — Ambulatory Visit (INDEPENDENT_AMBULATORY_CARE_PROVIDER_SITE_OTHER): Payer: Medicare HMO | Admitting: Otolaryngology

## 2023-08-25 ENCOUNTER — Other Ambulatory Visit: Payer: Self-pay

## 2023-08-25 ENCOUNTER — Encounter (INDEPENDENT_AMBULATORY_CARE_PROVIDER_SITE_OTHER): Payer: Self-pay

## 2023-08-25 VITALS — BP 124/76 | HR 83 | Ht 73.0 in | Wt 193.0 lb

## 2023-08-25 DIAGNOSIS — R9389 Abnormal findings on diagnostic imaging of other specified body structures: Secondary | ICD-10-CM

## 2023-08-25 DIAGNOSIS — J342 Deviated nasal septum: Secondary | ICD-10-CM

## 2023-08-25 DIAGNOSIS — R0982 Postnasal drip: Secondary | ICD-10-CM

## 2023-08-25 DIAGNOSIS — R0981 Nasal congestion: Secondary | ICD-10-CM

## 2023-08-25 DIAGNOSIS — J343 Hypertrophy of nasal turbinates: Secondary | ICD-10-CM

## 2023-08-25 DIAGNOSIS — J310 Chronic rhinitis: Secondary | ICD-10-CM | POA: Diagnosis not present

## 2023-08-25 MED ORDER — IPRATROPIUM BROMIDE 0.06 % NA SOLN
2.0000 | Freq: Two times a day (BID) | NASAL | 12 refills | Status: AC | PRN
  Filled 2023-08-25: qty 15, 21d supply, fill #0

## 2023-08-25 NOTE — Telephone Encounter (Signed)
 Phone call placed to patient this afternoon to go over the results of the nuclear medicine study done on the parathyroid glands.  No mass was seen in the parathyroid glands. Advised patient that I did call and speak with one of the radiologist Dr. Ty Hilts regarding the normal parathyroid nuclear study and the reported mass seen anterior to the right thyroid lobe on thyroid ultrasound done 07/30/2023.  He states he is able to see this nodule on CTA of the chest that was done in January of this year and also on CT of the chest that was done back in 2021.  It is stable in size from 2021.  He thinks it may be ectopic thyroid tissue and probably not worth worrying about.  He states we can consider repeat thyroid ultrasound in 6 months to see if it remains stable or could also have Dr. Darnell Level look at it but favors the former. I explained all this to the patient.  Patient is asymptomatic.  We agreed to refer him to Dr. Gerrit Friends to have him take a look to make sure there is nothing to be concerned about.

## 2023-08-28 DIAGNOSIS — J310 Chronic rhinitis: Secondary | ICD-10-CM | POA: Insufficient documentation

## 2023-08-28 DIAGNOSIS — R0982 Postnasal drip: Secondary | ICD-10-CM | POA: Insufficient documentation

## 2023-08-28 DIAGNOSIS — J342 Deviated nasal septum: Secondary | ICD-10-CM | POA: Insufficient documentation

## 2023-08-28 DIAGNOSIS — J343 Hypertrophy of nasal turbinates: Secondary | ICD-10-CM | POA: Insufficient documentation

## 2023-08-28 NOTE — Progress Notes (Signed)
 Patient ID: [name], male   DOB: [birth year], 73 y.o.   MRN: [id number]  CC: Chronic postnasal drainage  HPI:  [name] is a 73 y.o. male who presents today complaining of chronic postnasal drainage.  The drainage is worse at night.  He has been symptomatic for several months.  He denies any recent sinusitis or upper respiratory infections.  He has no known environmental allergies.  He has no previous ENT surgery.  Currently he denies any facial pain, fever, or visual change.  He previously tried Flonase nasal spray without improvement in his symptoms.  Past Medical History:  Diagnosis Date   Abscess of anal and rectal regions 07/15/2007   Qualifier: Diagnosis of  By: Delrae Alfred MD, Elizabeth     Atrial fibrillation [facility])    Diabetes mellitus without complication (HCC) 2008   type 2   FISTULA, ANAL 01/27/2007   Qualifier: Diagnosis of  By: Delrae Alfred MD, Elizabeth     GERD (gastroesophageal reflux disease)    Hypertension Dx 2008   Primary open angle glaucoma (POAG) of both eyes    Mild 06/25/22 Dr. Nile Riggs   Prostate cancer [facility])    Spontaneous pneumothorax    Stroke [facility])     Past Surgical History:  Procedure Laterality Date   CATARACT EXTRACTION  06/2017   CYSTOSCOPY  05/2017   INTERCOSTAL NERVE BLOCK Left 05/13/2020   Procedure: INTERCOSTAL NERVE BLOCK;  Surgeon: Corliss Skains, MD;  Location: [facility] OR;  Service: Thoracic;  Laterality: Left;   LUMBAR LAMINECTOMY/DECOMPRESSION MICRODISCECTOMY Right 03/11/2021   Procedure: Microdiscectomy - right - Lumbar four-Lumbar five extraforaminal;  Surgeon: Julio Sicks, MD;  Location: [facility] OR;  Service: Neurosurgery;  Laterality: Right;   LUMBAR LAMINECTOMY/DECOMPRESSION MICRODISCECTOMY Left 12/29/2021   Procedure: Extraforaminal microdiscectomy - left - L4-L5;  Surgeon: Julio Sicks, MD;  Location: [facility] OR;  Service: Neurosurgery;  Laterality: Left;  3C   PROSTATE BIOPSY     WEDGE RESECTION Left 05/13/2020   XI robotic  assisted thorascopy    Family History  Problem Relation Age of Onset   Diabetes Mother    Breast cancer Mother    Diabetes Father    Breast cancer Sister    Prostate cancer Neg Hx    Colon cancer Neg Hx    Pancreatic cancer Neg Hx     Social History:  reports that he has been smoking pipe. He has never used smokeless tobacco. He reports current alcohol use of about 1.0 - 2.0 standard drink of alcohol per week. He reports that he does not use drugs.  Allergies:  Allergies  Allergen Reactions   Lisinopril Swelling    Throat swelling   Tramadol Hcl     "Makes me crazy"    Prior to Admission medications   Medication Sig Start Date End Date Taking? Authorizing Provider  amLODipine (NORVASC) 10 MG tablet Take 1 tablet (10 mg total) by mouth daily. 09/30/22  Yes Marcine Matar, MD  apixaban (ELIQUIS) 5 MG TABS tablet Take 1 tablet (5 mg total) by mouth 2 (two) times daily. 01/21/23  Yes Marcine Matar, MD  B Complex Vitamins (VITAMIN B COMPLEX) TABS Take 1 tablet by mouth daily.   Yes [provider]  diclofenac Sodium (VOLTAREN) 1 % GEL Apply 2 g topically 3 (three) times daily as needed (pain). 07/14/22  Yes Marcine Matar, MD  dicyclomine (BENTYL) 20 MG tablet Take 1 tablet (20 mg total) by mouth 4 (four) times daily as needed (  intestinal cramps). 05/01/22  Yes Zenia Resides, MD  famotidine (PEPCID) 20 MG tablet Take 1 tablet (20 mg total) by mouth 2 (two) times daily. 07/02/23  Yes Loetta Rough, MD  fluticasone (FLONASE) 50 MCG/ACT nasal spray Place 1 spray into both nostrils at bedtime x 1 week then use as needed 04/05/23  Yes Marcine Matar, MD  glimepiride (AMARYL) 1 MG tablet Take 1 tablet (1 mg total) by mouth daily with breakfast. 09/30/22  Yes Marcine Matar, MD  hydrOXYzine (VISTARIL) 25 MG capsule Take 1 capsule (25 mg total) by mouth 2 (two) times daily as needed. 05/11/23  Yes Marcine Matar, MD  ipratropium (ATROVENT) 0.06 % nasal spray  Place 2 sprays into both nostrils 2 (two) times daily as needed (drainage). 08/25/23 09/24/23 Yes Newman Pies, MD  latanoprost (XALATAN) 0.005 % ophthalmic solution Place 1 drop into both eyes nightly. 12/07/22  Yes   loratadine (CLARITIN) 10 MG tablet Take 1 tablet (10 mg total) by mouth daily as needed for allergies. 04/05/23  Yes Marcine Matar, MD  LORazepam (ATIVAN) 0.5 MG tablet Take 1 tablet (0.5 mg total) by mouth every 8 (eight) hours as needed (hiccups). 07/05/23  Yes Levin Erp, MD  metoCLOPramide (REGLAN) 10 MG tablet Take 1 tablet (10 mg total) by mouth every 6 (six) hours. 07/02/23  Yes Loetta Rough, MD  Multiple Vitamin (MULTIVITAMIN WITH MINERALS) TABS tablet Take 1 tablet by mouth daily.   Yes [provider]  nebivolol (BYSTOLIC) 2.5 MG tablet Take 1 tablet (2.5 mg total) by mouth daily. 07/16/23  Yes Marcine Matar, MD  omeprazole (PRILOSEC) 20 MG capsule Take 1 capsule (20 mg total) by mouth daily. 06/12/22  Yes Marcine Matar, MD  rosuvastatin (CRESTOR) 5 MG tablet Take 1 tablet (5 mg total) by mouth daily. STOP Pravastatin.  Pt wants to be notified when ready for pick up 05/18/23  Yes Marcine Matar, MD  silver sulfADIAZINE (SILVADENE) 1 % cream Apply 1 Application topically daily. 07/27/23  Yes Marcine Matar, MD    Blood pressure 124/76, pulse 83, height 6\' 1"  (1.854 m), weight 193 lb (87.5 kg), SpO2 91%. Exam:  General: Communicates without difficulty, well nourished, no acute distress. Head: Normocephalic, no evidence injury, no tenderness, facial buttresses intact without stepoff. Face/sinus: No tenderness to palpation and percussion. Facial movement is normal and symmetric. Eyes: PERRL, EOMI. No scleral icterus, conjunctivae clear. Neuro: CN II exam reveals vision grossly intact.  No nystagmus at any point of gaze. Ears: Auricles well formed without lesions.  Ear canals are intact without mass or lesion.  No erythema or edema is appreciated.  The TMs  are intact without fluid. Nose: External evaluation reveals normal support and skin without lesions.  Dorsum is intact.  Anterior rhinoscopy reveals congested mucosa over anterior aspect of inferior turbinates and intact septum.  No purulence noted. Oral:  Oral cavity and oropharynx are intact, symmetric, without erythema or edema.  Mucosa is moist without lesions. Neck: Full range of motion without pain.  There is no significant lymphadenopathy.  No masses palpable.  Thyroid bed within normal limits to palpation.  Parotid glands and submandibular glands equal bilaterally without mass.  Trachea is midline. Neuro:  CN 2-12 grossly intact.    Procedure:  Flexible Nasal Endoscopy: Description: Risks, benefits, and alternatives of flexible endoscopy were explained to the patient.  Specific mention was made of the risk of throat numbness with difficulty swallowing, possible bleeding from  the nose and mouth, and pain from the procedure.  The patient gave oral consent to proceed.  The flexible scope was inserted into the right nasal cavity.  Endoscopy of the interior nasal cavity, superior, inferior, and middle meatus was performed. The sphenoid-ethmoid recess was examined. Edematous mucosa was noted.  No polyp, mass, or lesion was appreciated. Nasal septal deviation noted. Olfactory cleft was clear.  Nasopharynx with postnasal drainage.  Turbinates were hypertrophied but without mass.  The procedure was repeated on the contralateral side with similar findings.  The patient tolerated the procedure well.   Assessment: 1.  Chronic rhinitis with nasal mucosal congestion, nasal septal deviation, and bilateral inferior turbinate hypertrophy. 2.  Chronic postnasal drainage. 3.  No suspicious mass, lesion, or acute infection is noted today.  Plan: 1.  The physical exam and nasal endoscopy findings are reviewed with the patient. 2.  The patient is reassured that no infection is noted today. 3.  Atrovent nasal spray 2  sprays each nostril twice daily as needed to treat the nasal drainage. 4.  The patient will return for reevaluation in 2 months.  Tabita Corbo W Pari Lombard 08/28/2023, 11:35 AM

## 2023-08-31 ENCOUNTER — Other Ambulatory Visit: Payer: Self-pay

## 2023-08-31 ENCOUNTER — Telehealth (HOSPITAL_BASED_OUTPATIENT_CLINIC_OR_DEPARTMENT_OTHER): Admitting: Internal Medicine

## 2023-08-31 ENCOUNTER — Encounter: Payer: Self-pay | Admitting: Internal Medicine

## 2023-08-31 ENCOUNTER — Telehealth: Payer: Self-pay

## 2023-08-31 DIAGNOSIS — N5201 Erectile dysfunction due to arterial insufficiency: Secondary | ICD-10-CM

## 2023-08-31 MED ORDER — SILDENAFIL CITRATE 50 MG PO TABS
ORAL_TABLET | ORAL | 1 refills | Status: DC
  Filled 2023-08-31: qty 30, 30d supply, fill #0

## 2023-08-31 NOTE — Progress Notes (Signed)
 Patient ID: [name], male   DOB: 01/20/1951, 73 y.o.   MRN: [id number] Virtual Visit via Video Note  I connected with Dylen C Gashi on 08/31/2023 at 8:16 AM by a video enabled telemedicine application and verified that I am speaking with the correct person using two identifiers.  Location: Patient: home Provider: Office   I discussed the limitations of evaluation and management by telemedicine and the availability of in person appointments. The patient expressed understanding and agreed to proceed.  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     This is an urgent care visit.  Patient had sent me a MyChart message requesting prescription for Viagra.  Patient states he has had issues maintaining an erection for the past 5 months.  He reports trying Viagra in the distant past with good results.  Outpatient Encounter Medications as of 08/31/2023  Medication Sig   amLODipine (NORVASC) 10 MG tablet Take 1 tablet (10 mg total) by mouth daily.   apixaban (ELIQUIS) 5 MG TABS tablet Take 1 tablet (5 mg total) by mouth 2 (two) times daily.   B Complex Vitamins (VITAMIN B COMPLEX) TABS Take 1 tablet by mouth daily.   diclofenac Sodium (VOLTAREN) 1 % GEL Apply 2 g topically 3 (three) times daily as needed (pain).   dicyclomine (BENTYL) 20 MG tablet Take 1 tablet (20 mg total) by mouth 4 (four) times daily as needed (intestinal cramps).   famotidine (PEPCID) 20 MG tablet Take 1 tablet (20 mg total) by mouth 2 (two) times daily.   fluticasone (FLONASE) 50 MCG/ACT nasal spray Place 1 spray into both nostrils at bedtime x 1 week then use as needed   glimepiride (AMARYL) 1 MG tablet Take 1 tablet (1 mg total) by mouth daily with breakfast.   hydrOXYzine (VISTARIL) 25 MG capsule Take 1 capsule (25 mg total) by  mouth 2 (two) times daily as needed.   ipratropium (ATROVENT) 0.06 % nasal spray Place 2 sprays into both nostrils 2 (two) times daily as needed (drainage).   latanoprost (XALATAN) 0.005 % ophthalmic solution Place 1 drop into both eyes nightly.   loratadine (CLARITIN) 10 MG tablet Take 1 tablet (10 mg total) by mouth daily as needed for allergies.   LORazepam (ATIVAN) 0.5 MG tablet Take 1 tablet (0.5 mg total) by mouth every 8 (eight) hours as needed (hiccups).   metoCLOPramide (REGLAN) 10 MG tablet Take 1 tablet (10 mg total) by mouth every 6 (six) hours.   Multiple Vitamin (MULTIVITAMIN WITH MINERALS) TABS tablet Take 1 tablet by mouth daily.   nebivolol (BYSTOLIC) 2.5 MG tablet Take 1 tablet (2.5 mg total) by mouth daily.   omeprazole (PRILOSEC) 20 MG capsule Take 1 capsule (20 mg total) by mouth daily.   rosuvastatin (CRESTOR) 5 MG tablet Take 1 tablet (5 mg total) by mouth daily. STOP Pravastatin.  Pt wants to be notified when ready for pick up   silver sulfADIAZINE (SILVADENE) 1 % cream Apply 1 Application topically daily.   No facility-administered encounter medications on file as of 08/31/2023.      Observations/Objective: Elderly African-American male sitting up in NAD  Assessment and Plan: 1. Erectile dysfunction due to arterial insufficiency (Primary) Patient with 43-month history of problems maintaining an erection.  He reports being on Viagra in the  distant past with good results.  We will do a trial of Viagra. Pt advised of possible side effects of this medication including prolong erection, flushing, headaches, stuff nose and sudden vision and hearing changes.  Pt told to be seen in ER if he has erection lasting longer than 2 hrs, or if he has sudden vision changes or hearing loss.  Try to take it on a not so full stomach for better results - sildenafil (VIAGRA) 50 MG tablet; 1 tablet p.o. half hour to 1 hour prior to intercourse as needed.  Limit use to 1 tab/24 hr period   Dispense: 30 tablet; Refill: 1   Follow Up Instructions: PRN   I discussed the assessment and treatment plan with the patient. The patient was provided an opportunity to ask questions and all were answered. The patient agreed with the plan and demonstrated an understanding of the instructions.   The patient was advised to call back or seek an in-person evaluation if the symptoms worsen or if the condition fails to improve as anticipated.  I spent 8 minutes dedicated to the care of this patient on the date of this encounter to include previsit review of MyChart message, my last note, face-to-face time with patient discussing diagnosis and management and post visit entering of order.  This note has been created with Education officer, environmental. Any transcriptional errors are unintentional.  Jonah Blue, MD

## 2023-08-31 NOTE — Telephone Encounter (Signed)
 Patient's video visit was completed by PCP this morning.   Copied from CRM [id number]. Topic: Appointments - Scheduling Inquiry for Clinic >> Aug 31, 2023  8:20 AM Marland Kitchen D wrote: Patient called in to reach the office for his video appointment today he said he was disconnected

## 2023-09-01 ENCOUNTER — Other Ambulatory Visit: Payer: Self-pay

## 2023-09-15 ENCOUNTER — Other Ambulatory Visit: Payer: Self-pay | Admitting: Internal Medicine

## 2023-09-15 MED ORDER — OMEPRAZOLE 20 MG PO CPDR
20.0000 mg | DELAYED_RELEASE_CAPSULE | Freq: Every day | ORAL | 3 refills | Status: AC
  Filled 2023-09-15: qty 30, 30d supply, fill #0
  Filled 2023-10-17: qty 30, 30d supply, fill #1
  Filled 2024-06-12: qty 30, 30d supply, fill #2
  Filled 2024-07-19: qty 30, 30d supply, fill #3

## 2023-09-16 ENCOUNTER — Other Ambulatory Visit (HOSPITAL_COMMUNITY): Payer: Self-pay

## 2023-09-16 ENCOUNTER — Other Ambulatory Visit: Payer: Self-pay

## 2023-09-27 ENCOUNTER — Other Ambulatory Visit: Payer: Self-pay

## 2023-09-27 ENCOUNTER — Other Ambulatory Visit (HOSPITAL_COMMUNITY): Payer: Self-pay

## 2023-09-27 DIAGNOSIS — H401134 Primary open-angle glaucoma, bilateral, indeterminate stage: Secondary | ICD-10-CM | POA: Diagnosis not present

## 2023-09-27 DIAGNOSIS — Z961 Presence of intraocular lens: Secondary | ICD-10-CM | POA: Diagnosis not present

## 2023-09-27 LAB — HM DIABETES EYE EXAM

## 2023-09-27 MED ORDER — LATANOPROST 0.005 % OP SOLN
1.0000 [drp] | Freq: Every day | OPHTHALMIC | 3 refills | Status: AC
  Filled 2023-09-27 – 2023-10-11 (×2): qty 7.5, 75d supply, fill #0
  Filled 2023-10-17: qty 7.5, 75d supply, fill #1
  Filled 2023-12-06: qty 7.5, 75d supply, fill #0
  Filled 2024-05-25: qty 7.5, 75d supply, fill #1

## 2023-10-06 ENCOUNTER — Other Ambulatory Visit: Payer: Self-pay

## 2023-10-07 ENCOUNTER — Other Ambulatory Visit (HOSPITAL_COMMUNITY): Payer: Self-pay

## 2023-10-11 ENCOUNTER — Other Ambulatory Visit: Payer: Self-pay | Admitting: Internal Medicine

## 2023-10-11 ENCOUNTER — Other Ambulatory Visit: Payer: Self-pay

## 2023-10-11 DIAGNOSIS — E1149 Type 2 diabetes mellitus with other diabetic neurological complication: Secondary | ICD-10-CM

## 2023-10-12 ENCOUNTER — Other Ambulatory Visit: Payer: Self-pay

## 2023-10-13 ENCOUNTER — Other Ambulatory Visit: Payer: Self-pay

## 2023-10-14 ENCOUNTER — Other Ambulatory Visit: Payer: Self-pay

## 2023-10-17 ENCOUNTER — Other Ambulatory Visit: Payer: Self-pay | Admitting: Internal Medicine

## 2023-10-17 DIAGNOSIS — E1149 Type 2 diabetes mellitus with other diabetic neurological complication: Secondary | ICD-10-CM

## 2023-10-18 ENCOUNTER — Other Ambulatory Visit: Payer: Self-pay

## 2023-10-19 ENCOUNTER — Other Ambulatory Visit: Payer: Self-pay

## 2023-10-19 MED ORDER — GLIMEPIRIDE 1 MG PO TABS
1.0000 mg | ORAL_TABLET | Freq: Every day | ORAL | 0 refills | Status: DC
  Filled 2023-10-19: qty 90, 90d supply, fill #0

## 2023-10-20 ENCOUNTER — Other Ambulatory Visit: Payer: Self-pay

## 2023-10-21 ENCOUNTER — Other Ambulatory Visit: Payer: Self-pay

## 2023-10-27 ENCOUNTER — Ambulatory Visit (INDEPENDENT_AMBULATORY_CARE_PROVIDER_SITE_OTHER)

## 2023-11-25 ENCOUNTER — Ambulatory Visit: Payer: Medicare HMO | Admitting: Internal Medicine

## 2023-12-03 DIAGNOSIS — H524 Presbyopia: Secondary | ICD-10-CM | POA: Diagnosis not present

## 2023-12-06 ENCOUNTER — Other Ambulatory Visit: Payer: Self-pay

## 2023-12-07 ENCOUNTER — Telehealth: Payer: Self-pay | Admitting: Podiatry

## 2023-12-07 ENCOUNTER — Encounter: Payer: Self-pay | Admitting: Internal Medicine

## 2023-12-07 ENCOUNTER — Ambulatory Visit: Attending: Internal Medicine | Admitting: Internal Medicine

## 2023-12-07 ENCOUNTER — Other Ambulatory Visit: Payer: Self-pay

## 2023-12-07 ENCOUNTER — Ambulatory Visit: Admitting: Internal Medicine

## 2023-12-07 VITALS — BP 128/72 | HR 69 | Temp 98.3°F | Ht 73.0 in | Wt 187.0 lb

## 2023-12-07 DIAGNOSIS — Z2821 Immunization not carried out because of patient refusal: Secondary | ICD-10-CM

## 2023-12-07 DIAGNOSIS — Z8546 Personal history of malignant neoplasm of prostate: Secondary | ICD-10-CM

## 2023-12-07 DIAGNOSIS — I152 Hypertension secondary to endocrine disorders: Secondary | ICD-10-CM

## 2023-12-07 DIAGNOSIS — R911 Solitary pulmonary nodule: Secondary | ICD-10-CM | POA: Diagnosis not present

## 2023-12-07 DIAGNOSIS — N5201 Erectile dysfunction due to arterial insufficiency: Secondary | ICD-10-CM | POA: Diagnosis not present

## 2023-12-07 DIAGNOSIS — I10 Essential (primary) hypertension: Secondary | ICD-10-CM | POA: Diagnosis not present

## 2023-12-07 DIAGNOSIS — E1149 Type 2 diabetes mellitus with other diabetic neurological complication: Secondary | ICD-10-CM | POA: Diagnosis not present

## 2023-12-07 DIAGNOSIS — L720 Epidermal cyst: Secondary | ICD-10-CM | POA: Diagnosis not present

## 2023-12-07 DIAGNOSIS — I480 Paroxysmal atrial fibrillation: Secondary | ICD-10-CM

## 2023-12-07 DIAGNOSIS — Z7984 Long term (current) use of oral hypoglycemic drugs: Secondary | ICD-10-CM | POA: Diagnosis not present

## 2023-12-07 LAB — GLUCOSE, POCT (MANUAL RESULT ENTRY): POC Glucose: 88 mg/dL (ref 70–99)

## 2023-12-07 LAB — POCT GLYCOSYLATED HEMOGLOBIN (HGB A1C): HbA1c, POC (controlled diabetic range): 6.7 % (ref 0.0–7.0)

## 2023-12-07 MED ORDER — SILDENAFIL CITRATE 100 MG PO TABS
ORAL_TABLET | ORAL | 5 refills | Status: AC
  Filled 2023-12-07: qty 15, 15d supply, fill #0
  Filled 2024-06-12: qty 15, 15d supply, fill #1

## 2023-12-07 MED ORDER — AMLODIPINE BESYLATE 10 MG PO TABS
10.0000 mg | ORAL_TABLET | Freq: Every day | ORAL | 1 refills | Status: AC
  Filled 2023-12-07: qty 90, 90d supply, fill #0
  Filled 2024-06-12: qty 90, 90d supply, fill #1

## 2023-12-07 MED ORDER — APIXABAN 5 MG PO TABS
5.0000 mg | ORAL_TABLET | Freq: Two times a day (BID) | ORAL | 1 refills | Status: AC
  Filled 2023-12-07: qty 180, 90d supply, fill #0
  Filled 2024-06-12: qty 180, 90d supply, fill #1

## 2023-12-07 MED ORDER — ROSUVASTATIN CALCIUM 5 MG PO TABS
5.0000 mg | ORAL_TABLET | Freq: Every day | ORAL | 1 refills | Status: AC
  Filled 2023-12-07 – 2024-05-15 (×2): qty 90, 90d supply, fill #0

## 2023-12-07 MED ORDER — GLIMEPIRIDE 1 MG PO TABS
1.0000 mg | ORAL_TABLET | Freq: Every day | ORAL | 1 refills | Status: AC
  Filled 2023-12-07 – 2024-06-12 (×2): qty 90, 90d supply, fill #0

## 2023-12-07 NOTE — Telephone Encounter (Signed)
 Pt would like referral to [facility]

## 2023-12-07 NOTE — Patient Instructions (Addendum)
 Please call [facility] to schedule the appointment with Dr. Abraham Abo Ph.# [phone] Fax # [phone] address [address]  [address]   VISIT SUMMARY:  Today, we discussed your thyroid  and parathyroid  concerns, diabetes management, prostate cancer follow-up, blood pressure control, atrial fibrillation, and a few other health issues. We also reviewed your general health maintenance, including your recent eye exam and upcoming podiatrist visit.  YOUR PLAN:  -THYROID  NODULE: A thyroid  nodule is a growth in the thyroid  gland. We have referred you to Dr. Laurel Poncho at [facility] for further evaluation. Please contact them to schedule your appointment.  -LUNG NODULE: A lung nodule is a small growth in the lung. We recommend a repeat CT scan in August or September to monitor it. Please schedule this scan accordingly.  -PROSTATE CANCER: Your prostate cancer was treated with radiation seeds, and your last PSA level was 0.4 in July 2024. We will check your PSA levels again and you should follow up with Dr. Derrick Fling in August.  -ATRIAL FIBRILLATION: Atrial fibrillation is an irregular heart rhythm. Continue taking Eliquis  as prescribed to prevent blood clots. We have refilled your prescription.  -HYPERTENSION: Hypertension is high blood pressure. Your blood pressure is well-controlled with amlodipine  and nebivolol . Continue taking these medications as prescribed. We have refilled your amlodipine  prescription.  -TYPE 2 DIABETES MELLITUS: Type 2 diabetes is a condition that affects blood sugar control. Your A1c is 6.7, which is within the target range. Continue taking glimepiride  as prescribed. We have refilled your prescription.  -ERECTILE DYSFUNCTION: Erectile dysfunction is the inability to maintain an erection. Since sildenafil  50 mg was not effective, we are increasing the dose to 100 mg. Avoid taking it on a full stomach for better effectiveness.  -GENERAL HEALTH  MAINTENANCE: Your recent eye exam showed no changes in vision. You have an upcoming podiatrist visit for a foot check-up. We will request a note from Dr. Dolores Freud for your health maintenance records.  INSTRUCTIONS:  1. Contact [facility] to schedule an appointment with Dr. Laurel Poncho for your thyroid  nodule. 2. Schedule a repeat CT scan for your lung nodule in August or September. 3. Follow up with Dr. Derrick Fling in August for your prostate cancer and get your PSA levels checked. 4. Continue taking your medications as prescribed: Eliquis  for atrial fibrillation, amlodipine  and nebivolol  for hypertension, and glimepiride  for diabetes. 5. Increase your sildenafil  dose to 100 mg and avoid taking it on a full stomach. 6. Attend your upcoming podiatrist visit and provide a note from Dr. Dolores Freud for your health maintenance records.

## 2023-12-07 NOTE — Progress Notes (Signed)
 Patient ID: [name], male    DOB: [birth year]  MRN: [id number]  CC: Diabetes (DM & HTN f/u. Med refill. Justina Oman sildenafil  dosage increase/Requesting PSA screening. /No to shingles vax)   Subjective: [name] is a 73 y.o. male who presents for chronic ds management. 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   Discussed the use of AI scribe software for clinical note transcription with the patient, who gave verbal consent to proceed.  History of Present Illness [name] is a 73 year old male who presents for chronic ds management  In March, a scan identified an issue behind the thyroid  gland, prompting a referral for thyroid  US  then  parathyroid  scan for ?parathyroid  adenoma.  The parathyroid  scan came back showing no adenoma.  However there was still a question of questionable masslike structure behind the thyroid  gland.  I had referred him to general surgeon Dr. Fredie Jarvis to get an opinion on this.  Patient states he was called but misplaced the number.  Requesting increased dose on sildenafil .  50 mg does not work as well as he would like.  DM:  Results for orders placed or performed in visit on 12/07/23  POCT glucose (manual entry)   Collection Time: 12/07/23  4:19 PM  Result Value Ref Range   POC Glucose 88 70 - 99 mg/dl  POCT glycosylated hemoglobin (Hb A1C)   Collection Time: 12/07/23  4:37 PM  Result Value Ref Range   Hemoglobin A1C     HbA1c POC (<> result, manual entry)     HbA1c, POC (prediabetic range)     HbA1c, POC (controlled diabetic range) 6.7 0.0 - 7.0 %   He manages diabetes with glimepiride , maintaining blood sugar levels in the 90s to low 100s and an A1c of 6.7%. He follows a diet rich in vegetables and fruits. Recent eye exam done by Dr.  Candi Chafe showed no changes in vision. He is scheduled for a podiatrist visit for a foot check-up.  He has a history of prostate cancer, treated with radiation seed implantation, and is due for a urologist follow-up in August. His last PSA in July 2024 was 0.4 ng/mL.  Requesting to have PSA level checked today.  HTN/PAF: Blood pressure is controlled with amlodipine  and nebivolol , with readings in the high 120s/70s.  No chest pains or shortness of breath.  No swelling in the legs.  He has atrial fibrillation and is on Eliquis , with no bleeding or bruising.  A persistent lesion on his left elbow has been present for five months, and a spot on his upper back has been present for over a year, occasionally causing itching.   Due for repeat CAT scan of the chest to evaluate left upper lobe 6 mm nodule that was seen on CTA of the chest done 06/2023.  HM: declined shingles vaccine    Patient Active Problem List   Diagnosis Date Noted   Postnasal drip 08/28/2023   Chronic rhinitis 08/28/2023   Deviated nasal septum 08/28/2023   Hypertrophy of nasal turbinates 08/28/2023   Pneumonia 07/05/2023   Intractable hiccups 07/04/2023   Vomiting 07/04/2023   Lumbar radiculopathy 03/11/2021   Plantar flexed metatarsal, left 07/30/2020   Plantar flexed metatarsal, right 07/30/2020   Tobacco dependence 05/27/2020   History  of pneumothorax 05/27/2020   Pneumothorax 05/10/2020   Stressful life event affecting family 01/29/2020   Hyperlipidemia associated with type 2 diabetes mellitus (HCC) 01/29/2020   Personal history of fall 01/29/2020   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   Unintended weight loss 01/29/2020   Pain due to onychomycosis of toenails of both feet 08/01/2019   Porokeratosis 08/01/2019   Influenza vaccine needed 02/24/2019   Diabetes mellitus (HCC) 02/24/2019   Hyperlipemia 11/24/2016   History of CVA with residual deficit 11/24/2016   GERD (gastroesophageal reflux  disease) 08/09/2014   Atrial fibrillation (HCC)    Hemiparesis affecting left side as late effect of stroke (HCC) 05/01/2014   ERECTILE DYSFUNCTION, 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 60 capsule 1   loratadine  (CLARITIN ) 10 MG tablet Take 1 tablet (10 mg total) by mouth daily as needed for allergies. 60 tablet 1   LORazepam  (ATIVAN ) 0.5 MG tablet Take 1 tablet (0.5 mg total) by mouth every 8 (eight) hours as needed (hiccups). 20 tablet 0   amLODipine  (NORVASC ) 10 MG tablet Take 1 tablet (10 mg total) by mouth daily. 90 tablet 1   apixaban  (ELIQUIS ) 5 MG TABS tablet Take 1 tablet (5 mg total) by mouth 2 (two) times daily. 180 tablet 1   B Complex Vitamins (VITAMIN B COMPLEX ) TABS Take 1 tablet by mouth daily.     diclofenac  Sodium (VOLTAREN ) 1 % GEL Apply 2 g topically 3 (three) times daily as needed (pain). 100 g 0   dicyclomine  (BENTYL ) 20 MG tablet Take 1 tablet (20 mg total) by mouth 4 (four) times daily as needed (intestinal cramps). 40 tablet 0   famotidine  (PEPCID ) 20 MG tablet Take 1 tablet (20 mg total) by mouth 2 (two) times daily. 30 tablet 0   fluticasone  (FLONASE ) 50 MCG/ACT nasal spray Place 1 spray into both nostrils at bedtime x 1 week then use as needed 16 g 1   glimepiride  (AMARYL ) 1 MG tablet Take 1 tablet (1 mg total) by mouth daily with breakfast. 90 tablet 0   ipratropium (ATROVENT ) 0.06 % nasal spray Place 2 sprays into both nostrils 2 (two) times daily as needed (drainage). (Patient not taking: Reported on 12/07/2023) 15 mL 12   latanoprost  (XALATAN ) 0.005 % ophthalmic solution Place 1  drop into both eyes nightly. 7.5 mL 1   latanoprost  (XALATAN ) 0.005 % ophthalmic solution Place 1 drop into both eyes at bedtime. 7.5 mL 3   metoCLOPramide  (REGLAN ) 10 MG tablet Take 1 tablet (10 mg total) by mouth every 6 (six) hours. 30 tablet 0   Multiple Vitamin (MULTIVITAMIN WITH MINERALS) TABS tablet Take 1 tablet by mouth daily.     nebivolol  (BYSTOLIC ) 2.5 MG tablet Take 1 tablet (2.5 mg total) by mouth daily. 90 tablet 3   omeprazole  (PRILOSEC) 20 MG capsule Take 1 capsule (20 mg total) by mouth daily. 30 capsule 3   rosuvastatin  (CRESTOR ) 5 MG tablet Take 1 tablet (5 mg total) by mouth daily. STOP Pravastatin .  Pt wants to be notified when ready for pick up 90 tablet 1   sildenafil  (  VIAGRA ) 50 MG tablet Take 1 tablet by mouth half hour to 1 hour prior to intercourse as needed.  Limit use to 1 tab/24 hr period 30 tablet 1   silver  sulfADIAZINE  (SILVADENE ) 1 % cream Apply 1 Application topically daily. 50 g 0   No current facility-administered medications on file prior to visit.    Allergies  Allergen Reactions   Lisinopril  Swelling    Throat swelling   Tramadol  Hcl     Makes me crazy    Social History   Socioeconomic History   Marital status: Married    Spouse name: Not on file   Number of children: 5   Years of education: Not on file   Highest education level: Not on file  Occupational History   Occupation: Caregiver    Comment: retired  Tobacco Use   Smoking status: Some Days    Types: Pipe   Smokeless tobacco: Never   Tobacco comments:    Quit 05/15/14  Vaping Use   Vaping status: Never Used  Substance and Sexual Activity   Alcohol use: Yes    Alcohol/week: 1.0 - 2.0 standard drink of alcohol    Types: 1 - 2 Standard drinks or equivalent per week    Comment: occasional wine   Drug use: No   Sexual activity: Not Currently  Other Topics Concern   Not on file  Social History Narrative   Lives with his wife   5 children, grown.   7 grandchildren.     Social Drivers of Health   Financial Resource Strain: Medium Risk (05/04/2023)   Overall Financial Resource Strain (CARDIA)    Difficulty of Paying Living Expenses: Somewhat hard  Food Insecurity: No Food Insecurity (07/04/2023)   Hunger Vital Sign    Worried About Running Out of Food in the Last Year: Never true    Ran Out of Food in the Last Year: Never true  Recent Concern: Food Insecurity - Food Insecurity Present (05/04/2023)   Hunger Vital Sign    Worried About Running Out of Food in the Last Year: Sometimes true    Ran Out of Food in the Last Year: Sometimes true  Transportation Needs: No Transportation Needs (07/04/2023)   PRAPARE - Administrator, Civil Service (Medical): No    Lack of Transportation (Non-Medical): No  Recent Concern: Transportation Needs - Unmet Transportation Needs (04/05/2023)   PRAPARE - Transportation    Lack of Transportation (Medical): Yes    Lack of Transportation (Non-Medical): Yes  Physical Activity: Insufficiently Active (05/04/2023)   Exercise Vital Sign    Days of Exercise per Week: 3 days    Minutes of Exercise per Session: 30 min  Stress: No Stress Concern Present (05/04/2023)   [employer] of Occupational Health - Occupational Stress Questionnaire    Feeling of Stress : Not at all  Social Connections: Moderately Integrated (07/04/2023)   Social Connection and Isolation Panel    Frequency of Communication with Friends and Family: More than three times a week    Frequency of Social Gatherings with Friends and Family: More than three times a week    Attends Religious Services: More than 4 times per year    Active Member of [employer] or Organizations: Yes    Attends Banker Meetings: More than 4 times per year    Marital Status: Separated  Intimate Partner Violence: Not At Risk (07/04/2023)   Humiliation, Afraid, Rape, and Kick questionnaire    Fear of Current  or Ex-Partner: No    Emotionally Abused: No     Physically Abused: No    Sexually Abused: No    Family History  Problem Relation Age of Onset   Diabetes Mother    Breast cancer Mother    Diabetes Father    Breast cancer Sister    Prostate cancer Neg Hx    Colon cancer Neg Hx    Pancreatic cancer Neg Hx     Past Surgical History:  Procedure Laterality Date   CATARACT EXTRACTION  06/2017   CYSTOSCOPY  05/2017   INTERCOSTAL NERVE BLOCK Left 05/13/2020   Procedure: INTERCOSTAL NERVE BLOCK;  Surgeon: Hilarie Lovely, MD;  Location: [facility] OR;  Service: Thoracic;  Laterality: Left;   LUMBAR LAMINECTOMY/DECOMPRESSION MICRODISCECTOMY Right 03/11/2021   Procedure: Microdiscectomy - right - Lumbar four-Lumbar five extraforaminal;  Surgeon: Agustina Aldrich, MD;  Location: [facility] OR;  Service: Neurosurgery;  Laterality: Right;   LUMBAR LAMINECTOMY/DECOMPRESSION MICRODISCECTOMY Left 12/29/2021   Procedure: Extraforaminal microdiscectomy - left - L4-L5;  Surgeon: Agustina Aldrich, MD;  Location: [facility] OR;  Service: Neurosurgery;  Laterality: Left;  3C   PROSTATE BIOPSY     WEDGE RESECTION Left 05/13/2020   XI robotic assisted thorascopy    ROS: Review of Systems Negative except as stated above  PHYSICAL EXAM: BP 132/71 (BP Location: Left Arm, Patient Position: Sitting, Cuff Size: Normal)   Pulse 69   Temp 98.3 F (36.8 C) (Oral)   Ht 6' 1 (1.854 m)   Wt 187 lb (84.8 kg)   SpO2 98%   BMI 24.67 kg/m   Wt Readings from Last 3 Encounters:  12/07/23 187 lb (84.8 kg)  08/25/23 193 lb (87.5 kg)  07/27/23 192 lb (87.1 kg)    Physical Exam   General appearance - alert, well appearing, and in no distress Mental status - normal mood, behavior, speech, dress, motor activity, and thought processes Chest - clear to auscultation, no wheezes, rales or rhonchi, symmetric air entry Heart -heart sounds are regular.  No murmurs heard. Extremities - peripheral pulses normal, no pedal edema, no clubbing or cyanosis Skin -1 x 0.8 cm soft movable  subcutaneous mass palpated just above the left elbow.  He has a small blackhead noted on the upper posterior thorax on the left side     Latest Ref Rng & Units 08/09/2023    2:12 PM 07/05/2023    2:39 AM 07/04/2023   11:28 AM  CMP  Glucose 70 - 99 mg/dL  161  096   BUN 8 - 23 mg/dL  21  17   Creatinine 0.45 - 1.24 mg/dL  4.09  8.11   Sodium 914 - 145 mmol/L  136  139   Potassium 3.5 - 5.1 mmol/L  3.8  3.7   Chloride 98 - 111 mmol/L  103  104   CO2 22 - 32 mmol/L  23  24   Calcium  8.6 - 10.2 mg/dL 9.2  8.6  8.9   Total Protein 6.5 - 8.1 g/dL   7.3   Total Bilirubin 0.0 - 1.2 mg/dL   0.8   Alkaline Phos 38 - 126 U/L   73   AST 15 - 41 U/L   14   ALT 0 - 44 U/L   12    Lipid Panel     Component Value Date/Time   CHOL 201 (H) 05/13/2023 1537   TRIG 208 (H) 05/13/2023 1537   HDL 47 05/13/2023 1537   CHOLHDL 4.3 05/13/2023  1537   CHOLHDL 4.2 05/02/2014 0000   VLDL 72 (H) 05/02/2014 0000   LDLCALC 118 (H) 05/13/2023 1537    CBC    Component Value Date/Time   WBC 8.7 07/05/2023 0239   RBC 3.97 (L) 07/05/2023 0239   HGB 12.0 (L) 07/05/2023 0239   HGB 13.5 01/07/2023 1424   HCT 35.7 (L) 07/05/2023 0239   HCT 38.2 01/07/2023 1424   PLT 234 07/05/2023 0239   PLT 244 01/07/2023 1424   MCV 89.9 07/05/2023 0239   MCV 87 01/07/2023 1424   MCH 30.2 07/05/2023 0239   MCHC 33.6 07/05/2023 0239   RDW 14.1 07/05/2023 0239   RDW 13.4 01/07/2023 1424   LYMPHSABS 1.0 07/02/2023 0945   MONOABS 0.5 07/02/2023 0945   EOSABS 0.0 07/02/2023 0945   BASOSABS 0.0 07/02/2023 0945    ASSESSMENT AND PLAN: 1. Type 2 diabetes mellitus with other neurologic complication, without long-term current use of insulin  (HCC) (Primary) At goal.  Continue Amaryl  1 mg daily.  Continue healthy eating habits. - POCT glycosylated hemoglobin (Hb A1C) - POCT glucose (manual entry) - glimepiride  (AMARYL ) 1 MG tablet; Take 1 tablet (1 mg total) by mouth daily with breakfast.  Dispense: 90 tablet; Refill: 1 -  rosuvastatin  (CRESTOR ) 5 MG tablet; Take 1 tablet (5 mg total) by mouth daily. STOP Pravastatin .  Pt wants to be notified when ready for pick up  Dispense: 90 tablet; Refill: 1  2. Hypertension associated with type 2 diabetes mellitus (HCC) At goal.  Continue amlodipine  and Nebivolol . - amLODipine  (NORVASC ) 10 MG tablet; Take 1 tablet (10 mg total) by mouth daily.  Dispense: 90 tablet; Refill: 1  3. Erectile dysfunction due to arterial insufficiency We will increase the dose of the sildenafil  to 100 mg - sildenafil  (VIAGRA ) 100 MG tablet; Take 1 tablet by mouth half hour to 1 hour prior to intercourse as needed.  Limit use to 1 tab/24 hr period  Dispense: 15 tablet; Refill: 5  4. Incidental lung nodule, > 3mm and < 8mm Plan to repeat CAT scan around late August. - CT Chest Wo Contrast; Future  5. PAF (paroxysmal atrial fibrillation) (HCC) Stable on Eliquis  - apixaban  (ELIQUIS ) 5 MG TABS tablet; Take 1 tablet (5 mg total) by mouth 2 (two) times daily.  Dispense: 180 tablet; Refill: 1  6. Epidermal cyst Advised that we observe.  If it increases in size or becomes symptomatic we can refer him to have it removed.  7. Herpes zoster vaccination declined  8. History of prostate cancer - PSA; Future    Patient was given the opportunity to ask questions.  Patient verbalized understanding of the plan and was able to repeat key elements of the plan.   This documentation was completed using Paediatric nurse.  Any transcriptional errors are unintentional.  Orders Placed This Encounter  Procedures   POCT glycosylated hemoglobin (Hb A1C)   POCT glucose (manual entry)     Requested Prescriptions   Pending Prescriptions Disp Refills   amLODipine  (NORVASC ) 10 MG tablet 90 tablet 1    Sig: Take 1 tablet (10 mg total) by mouth daily.   apixaban  (ELIQUIS ) 5 MG TABS tablet 180 tablet 1    Sig: Take 1 tablet (5 mg total) by mouth 2 (two) times daily.   glimepiride  (AMARYL ) 1  MG tablet 90 tablet 0    Sig: Take 1 tablet (1 mg total) by mouth daily with breakfast.   rosuvastatin  (CRESTOR ) 5 MG tablet 90 tablet  1    Sig: Take 1 tablet (5 mg total) by mouth daily. STOP Pravastatin .  Pt wants to be notified when ready for pick up    No follow-ups on file.  Concetta Dee, MD, FACP

## 2023-12-08 ENCOUNTER — Encounter: Payer: Self-pay | Admitting: Podiatry

## 2023-12-08 ENCOUNTER — Other Ambulatory Visit: Payer: Self-pay

## 2023-12-08 ENCOUNTER — Ambulatory Visit (INDEPENDENT_AMBULATORY_CARE_PROVIDER_SITE_OTHER): Admitting: Podiatry

## 2023-12-08 DIAGNOSIS — B351 Tinea unguium: Secondary | ICD-10-CM | POA: Diagnosis not present

## 2023-12-08 DIAGNOSIS — M216X2 Other acquired deformities of left foot: Secondary | ICD-10-CM | POA: Diagnosis not present

## 2023-12-08 DIAGNOSIS — M79674 Pain in right toe(s): Secondary | ICD-10-CM

## 2023-12-08 DIAGNOSIS — M216X1 Other acquired deformities of right foot: Secondary | ICD-10-CM

## 2023-12-08 DIAGNOSIS — E1149 Type 2 diabetes mellitus with other diabetic neurological complication: Secondary | ICD-10-CM

## 2023-12-08 DIAGNOSIS — M79675 Pain in left toe(s): Secondary | ICD-10-CM

## 2023-12-08 NOTE — Progress Notes (Signed)
This patient returns to my office for at risk foot care.  This patient requires this care by a professional since this patient will be at risk due to having diabetes.  This patient is unable to cut nails himself since the patient cannot reach his nails.These nails are painful walking and wearing shoes.  This patient presents for at risk foot care today.  General Appearance  Alert, conversant and in no acute stress.  Vascular  Dorsalis pedis and posterior tibial  pulses are palpable  bilaterally.  Capillary return is within normal limits  bilaterally. Temperature is within normal limits  bilaterally.  Neurologic  Senn-Weinstein monofilament wire test diminished bilaterally. Muscle power within normal limits bilaterally.  Nails Thick disfigured discolored nails with subungual debris  from hallux to fifth toes bilaterally. No evidence of bacterial infection or drainage bilaterally.  Orthopedic  No limitations of motion  feet .  No crepitus or effusions noted.  No bony pathology or digital deformities noted.  Plantar flexed fifth metatarsal  B/L.  Skin  normotropic skin with no porokeratosis noted bilaterally.  No signs of infections or ulcers noted.   Asymptomatic callus sub 5th  B/l.  Onychomycosis  Pain in right toes  Pain in left toes  Pes planus    Consent was obtained for treatment procedures.   Mechanical debridement of nails 1-5  bilaterally performed with a nail nipper.  Filed with dremel without incident.    Return office visit   3 months                  Told patient to return for periodic foot care and evaluation due to potential at risk complications.   Helane Gunther DPM

## 2023-12-13 ENCOUNTER — Encounter: Payer: Self-pay | Admitting: Internal Medicine

## 2023-12-15 ENCOUNTER — Telehealth: Payer: Self-pay | Admitting: Podiatry

## 2023-12-15 NOTE — Telephone Encounter (Signed)
 Patient called requesting a referral to [facility] for diabetic shoes.

## 2023-12-29 ENCOUNTER — Telehealth: Payer: Self-pay | Admitting: Internal Medicine

## 2023-12-29 DIAGNOSIS — E1149 Type 2 diabetes mellitus with other diabetic neurological complication: Secondary | ICD-10-CM

## 2023-12-29 DIAGNOSIS — Z8546 Personal history of malignant neoplasm of prostate: Secondary | ICD-10-CM

## 2023-12-29 NOTE — Telephone Encounter (Signed)
 Lab has been added.

## 2023-12-29 NOTE — Telephone Encounter (Signed)
 Copied from CRM [id number]. Topic: Clinical - Request for Lab/Test Order >> Dec 29, 2023  3:28 PM Antwanette L wrote: Reason for CRM: Patient has a walk in lab visit on 7/11. The patient is requesting a kidney function test. The patient can be contacted at [phone]

## 2023-12-29 NOTE — Addendum Note (Signed)
 Addended by: VICCI SOBER B on: 12/29/2023 08:18 PM   Modules accepted: Orders

## 2023-12-30 NOTE — Telephone Encounter (Signed)
 Called but no answer. LVM informing that requested lab work has been ordered.

## 2024-01-03 ENCOUNTER — Ambulatory Visit: Attending: Internal Medicine

## 2024-01-03 DIAGNOSIS — Z8546 Personal history of malignant neoplasm of prostate: Secondary | ICD-10-CM | POA: Diagnosis not present

## 2024-01-03 DIAGNOSIS — E1149 Type 2 diabetes mellitus with other diabetic neurological complication: Secondary | ICD-10-CM

## 2024-01-04 ENCOUNTER — Ambulatory Visit: Payer: Self-pay | Admitting: Internal Medicine

## 2024-01-04 LAB — BASIC METABOLIC PANEL WITH GFR
BUN/Creatinine Ratio: 16 (ref 10–24)
BUN: 22 mg/dL (ref 8–27)
CO2: 18 mmol/L — ABNORMAL LOW (ref 20–29)
Calcium: 9.5 mg/dL (ref 8.6–10.2)
Chloride: 103 mmol/L (ref 96–106)
Creatinine, Ser: 1.37 mg/dL — ABNORMAL HIGH (ref 0.76–1.27)
Glucose: 142 mg/dL — ABNORMAL HIGH (ref 70–99)
Potassium: 4.4 mmol/L (ref 3.5–5.2)
Sodium: 137 mmol/L (ref 134–144)
eGFR: 55 mL/min/1.73 — ABNORMAL LOW (ref >59)

## 2024-01-04 LAB — PSA: Prostate Specific Ag, Serum: 0.4 ng/mL (ref 0.0–4.0)

## 2024-01-10 ENCOUNTER — Telehealth: Payer: Self-pay | Admitting: Internal Medicine

## 2024-01-10 NOTE — Telephone Encounter (Signed)
 Copied from CRM [id number]. Topic: Clinical - Order For Equipment >> Jan 10, 2024  9:01 AM Elle L wrote:  Reason for CRM: The patient was following up on the fax request from 6/25 from [employer] for his diabetic shoes. The patient's call back number is [phone].

## 2024-01-12 NOTE — Telephone Encounter (Signed)
Will leave on your desk

## 2024-01-14 NOTE — Telephone Encounter (Signed)
 Fax successfully faxed back to [facility] on 01/14/2024. Fax #: [phone].

## 2024-01-29 ENCOUNTER — Encounter: Payer: Self-pay | Admitting: Internal Medicine

## 2024-01-29 ENCOUNTER — Other Ambulatory Visit: Payer: Self-pay | Admitting: Internal Medicine

## 2024-01-29 DIAGNOSIS — F419 Anxiety disorder, unspecified: Secondary | ICD-10-CM

## 2024-01-31 DIAGNOSIS — Z961 Presence of intraocular lens: Secondary | ICD-10-CM | POA: Diagnosis not present

## 2024-01-31 DIAGNOSIS — H401131 Primary open-angle glaucoma, bilateral, mild stage: Secondary | ICD-10-CM | POA: Diagnosis not present

## 2024-02-01 ENCOUNTER — Other Ambulatory Visit: Payer: Self-pay

## 2024-02-01 MED ORDER — HYDROXYZINE PAMOATE 25 MG PO CAPS
25.0000 mg | ORAL_CAPSULE | Freq: Two times a day (BID) | ORAL | 1 refills | Status: DC | PRN
  Filled 2024-02-01: qty 60, 30d supply, fill #0
  Filled 2024-05-15: qty 60, 30d supply, fill #1

## 2024-02-02 ENCOUNTER — Other Ambulatory Visit: Payer: Self-pay

## 2024-02-09 ENCOUNTER — Ambulatory Visit
Admission: RE | Admit: 2024-02-09 | Discharge: 2024-02-09 | Disposition: A | Discharge status: Home / Self Care | Source: Ambulatory Visit | Attending: Internal Medicine | Admitting: Internal Medicine

## 2024-02-09 DIAGNOSIS — R911 Solitary pulmonary nodule: Secondary | ICD-10-CM

## 2024-02-09 DIAGNOSIS — J439 Emphysema, unspecified: Secondary | ICD-10-CM | POA: Diagnosis not present

## 2024-02-15 DIAGNOSIS — E1149 Type 2 diabetes mellitus with other diabetic neurological complication: Secondary | ICD-10-CM | POA: Diagnosis not present

## 2024-02-15 DIAGNOSIS — M216X1 Other acquired deformities of right foot: Secondary | ICD-10-CM | POA: Diagnosis not present

## 2024-02-15 DIAGNOSIS — M216X2 Other acquired deformities of left foot: Secondary | ICD-10-CM | POA: Diagnosis not present

## 2024-02-18 ENCOUNTER — Telehealth: Payer: Self-pay | Admitting: Internal Medicine

## 2024-02-18 NOTE — Telephone Encounter (Signed)
 Please offer pt appt with me for sometime in Sept to evaluate falls.

## 2024-02-22 ENCOUNTER — Telehealth: Payer: Self-pay | Admitting: Internal Medicine

## 2024-02-22 NOTE — Telephone Encounter (Signed)
 Copied from CRM [id number]. Topic: Clinical - Lab/Test Results >> Feb 22, 2024  8:24 AM [name] wrote: Reason for CRM: Pt wants to discuss their CT scan results  Best contact: [phone]

## 2024-02-22 NOTE — Telephone Encounter (Signed)
 Patient scheduled appointment to evaluate cause of falls for 03/10/2024. No further assistance needed at this time.

## 2024-02-23 ENCOUNTER — Telehealth: Payer: Self-pay | Admitting: Internal Medicine

## 2024-02-23 ENCOUNTER — Ambulatory Visit: Payer: Self-pay | Admitting: Internal Medicine

## 2024-02-23 NOTE — Telephone Encounter (Signed)
 PC placed to pt today to go over results of CT scan of chest. LVMM. I will try to reach him again later.

## 2024-02-23 NOTE — Telephone Encounter (Signed)
 PC placed to [facility] today to ask questions about the CT chest results. Dr. Kimberlee was not available. I was able to speak with Dr. Jeremy. I informed her of pt's hx and work up of thyroid  and ? PTH nodule. Dr. Kimberlee indicated that RT thyroid  nodule had increased in size compared to CTA done 06/2023 and suggested US  thyroid . However, Dr. Jeremy looked at studies done and said thyroid  US  is better in looking at thyroid  than CT scan but over all she did not think there was any change in size.  I also asked about the lung nodules 1 in both upper lobes on CTA 06/2023 but Dr. Kimberlee mentioned RML. Dr. Jeremy confirmed that the nodules seen were in LT and RT upper lobe and unchanged in size.  I called the patient this afternoon and went over results of the CAT scan with him.  Patient informed that the nodules seen in the lungs are unchanged in size which is good.  Did show changes of emphysema related to smoking history.  Also showed extensive calcification in the coronary arteries.  He is on rosuvastatin  and Eliquis .  Will discuss further on his upcoming visit with me. In regards to the thyroid  nodule, I told him that I had spoken with the radiologist and the nodule is unchanged.  I reminded him that I did refer him to see Dr. Eletha for second opinion and his office did call him.  Encouraged him to call them back if he is still interested in getting a second opinion on this.  All questions were answered.

## 2024-03-03 ENCOUNTER — Other Ambulatory Visit: Payer: Self-pay

## 2024-03-08 ENCOUNTER — Ambulatory Visit (INDEPENDENT_AMBULATORY_CARE_PROVIDER_SITE_OTHER): Admitting: Podiatry

## 2024-03-08 ENCOUNTER — Encounter: Payer: Self-pay | Admitting: Podiatry

## 2024-03-08 DIAGNOSIS — E1149 Type 2 diabetes mellitus with other diabetic neurological complication: Secondary | ICD-10-CM

## 2024-03-08 DIAGNOSIS — B351 Tinea unguium: Secondary | ICD-10-CM | POA: Diagnosis not present

## 2024-03-08 DIAGNOSIS — M79675 Pain in left toe(s): Secondary | ICD-10-CM | POA: Diagnosis not present

## 2024-03-08 DIAGNOSIS — M79674 Pain in right toe(s): Secondary | ICD-10-CM | POA: Diagnosis not present

## 2024-03-08 NOTE — Progress Notes (Signed)
 This patient returns to my office for at risk foot care.  This patient requires this care by a professional since this patient will be at risk due to having diabetes.  This patient is unable to cut nails himself since the patient cannot reach his nails.These nails are painful walking and wearing shoes.  This patient presents for at risk foot care today.  General Appearance  Alert, conversant and in no acute stress.  Vascular  Dorsalis pedis and posterior tibial  pulses are palpable  bilaterally.  Capillary return is within normal limits  bilaterally. Temperature is within normal limits  bilaterally.  Neurologic  Senn-Weinstein monofilament wire test diminished bilaterally. Muscle power within normal limits bilaterally.  Nails Thick disfigured discolored nails with subungual debris  from hallux to fifth toes bilaterally. No evidence of bacterial infection or drainage bilaterally.  Orthopedic  No limitations of motion  feet .  No crepitus or effusions noted.  No bony pathology or digital deformities noted.  Plantar flexed fifth metatarsal  B/L.  Skin  normotropic skin with no porokeratosis noted bilaterally.  No signs of infections or ulcers noted.   Asymptomatic callus sub 5th  B/l.  Onychomycosis  Pain in right toes  Pain in left toes  Pes planus    Consent was obtained for treatment procedures.   Mechanical debridement of nails 1-5  bilaterally performed with a nail nipper.  Filed with dremel without incident.    Return office visit   3 months                  Told patient to return for periodic foot care and evaluation due to potential at risk complications.   Cordella Bold DPM    Addendum.  Diabetic shoes prescribed for [place]. SABRAGAM

## 2024-03-10 ENCOUNTER — Ambulatory Visit: Admitting: Internal Medicine

## 2024-03-29 ENCOUNTER — Encounter: Payer: Self-pay | Admitting: Internal Medicine

## 2024-03-30 ENCOUNTER — Other Ambulatory Visit: Payer: Self-pay

## 2024-03-30 NOTE — Telephone Encounter (Signed)
 Appointment on 04/07/2024 has been cancelled.

## 2024-03-31 ENCOUNTER — Telehealth: Payer: Self-pay

## 2024-03-31 NOTE — Telephone Encounter (Signed)
 Noted

## 2024-03-31 NOTE — Telephone Encounter (Signed)
 Copied from CRM [id number]. Topic: Clinical - Medication Question >> Mar 31, 2024 10:04 AM Dedra B wrote: Reason for CRM: Pt called to follow up request for Paxlovid. Relayed message verbatim that Paxlovid can't be prescribed with Eliquis . Pt recalled being told this.

## 2024-04-03 ENCOUNTER — Encounter: Payer: Self-pay | Admitting: Internal Medicine

## 2024-04-03 ENCOUNTER — Ambulatory Visit: Attending: Internal Medicine | Admitting: Internal Medicine

## 2024-04-03 ENCOUNTER — Other Ambulatory Visit: Payer: Self-pay

## 2024-04-03 VITALS — BP 138/75 | HR 95 | Temp 98.5°F | Ht 73.0 in | Wt 185.0 lb

## 2024-04-03 DIAGNOSIS — E119 Type 2 diabetes mellitus without complications: Secondary | ICD-10-CM

## 2024-04-03 DIAGNOSIS — Z23 Encounter for immunization: Secondary | ICD-10-CM

## 2024-04-03 DIAGNOSIS — N182 Chronic kidney disease, stage 2 (mild): Secondary | ICD-10-CM

## 2024-04-03 DIAGNOSIS — E041 Nontoxic single thyroid nodule: Secondary | ICD-10-CM

## 2024-04-03 DIAGNOSIS — Z9181 History of falling: Secondary | ICD-10-CM | POA: Diagnosis not present

## 2024-04-03 DIAGNOSIS — E1159 Type 2 diabetes mellitus with other circulatory complications: Secondary | ICD-10-CM

## 2024-04-03 DIAGNOSIS — E1122 Type 2 diabetes mellitus with diabetic chronic kidney disease: Secondary | ICD-10-CM | POA: Diagnosis not present

## 2024-04-03 DIAGNOSIS — I480 Paroxysmal atrial fibrillation: Secondary | ICD-10-CM

## 2024-04-03 DIAGNOSIS — I152 Hypertension secondary to endocrine disorders: Secondary | ICD-10-CM

## 2024-04-03 DIAGNOSIS — I129 Hypertensive chronic kidney disease with stage 1 through stage 4 chronic kidney disease, or unspecified chronic kidney disease: Secondary | ICD-10-CM

## 2024-04-03 DIAGNOSIS — E1149 Type 2 diabetes mellitus with other diabetic neurological complication: Secondary | ICD-10-CM | POA: Diagnosis not present

## 2024-04-03 DIAGNOSIS — Z7984 Long term (current) use of oral hypoglycemic drugs: Secondary | ICD-10-CM

## 2024-04-03 DIAGNOSIS — I69354 Hemiplegia and hemiparesis following cerebral infarction affecting left non-dominant side: Secondary | ICD-10-CM | POA: Diagnosis not present

## 2024-04-03 LAB — POCT GLYCOSYLATED HEMOGLOBIN (HGB A1C): HbA1c, POC (controlled diabetic range): 7.1 % — AB (ref 0.0–7.0)

## 2024-04-03 LAB — GLUCOSE, POCT (MANUAL RESULT ENTRY): POC Glucose: 179 mg/dL — AB (ref 70–99)

## 2024-04-03 MED ORDER — ZOSTER VAC RECOMB ADJUVANTED 50 MCG/0.5ML IM SUSR: 0.5000 mL | Freq: Once | INTRAMUSCULAR | 0 refills | Status: AC

## 2024-04-03 NOTE — Progress Notes (Signed)
 Patient ID: [name], male    DOB: Oct 20, 1950  MRN: [id number]  CC: Diabetes (DM f/u./No questions / concerns/Yes to shingles. )   Subjective: [name] is a 73 y.o. male who presents for chronic ds management. 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    Discussed the use of AI scribe software for clinical note transcription with the patient, who gave verbal consent to proceed.  History of Present Illness [name] is a 73 year old male with diabetes, hypertension, atrial fibrillation, and a history of stroke who presents for routine follow-up.  Patient had COVID infection last week.  He feels that he has recovered well.  DM: Results for orders placed or performed in visit on 04/03/24  POCT glucose (manual entry)   Collection Time: 04/03/24 10:09 AM  Result Value Ref Range   POC Glucose 179 (A) 70 - 99 mg/dl  POCT glycosylated hemoglobin (Hb A1C)   Collection Time: 04/03/24 10:09 AM  Result Value Ref Range   Hemoglobin A1C     HbA1c POC (<> result, manual entry)     HbA1c, POC (prediabetic range)     HbA1c, POC (controlled diabetic range) 7.1 (A) 0.0 - 7.0 %   increase in his A1c to 7.1 from 6.7 in June. He attributes a recent high blood sugar reading to consuming two whole wheat waffles with maple syrup and pomegranate juice this morning for breakfast, which he describes as an occasional treat. He generally checks his blood sugar in the morning before breakfast, with readings typically in the low 100s. No significant hypoglycemic episodes. He continues to take glimepiride  1 mg daily.  HTN/CKD 2: His blood pressure was recorded at 138/75 today. He is currently on amlodipine  10 mg and nebivolol  2.5 mg daily. He monitors his blood pressure at home, usually recording  mid-120s over 78. He is mindful of his salt intake, avoiding high-sodium restaurants. No CP/SOB. Last GFR was 55, usually >60. Not on NSAIDS  He has a history of atrial fibrillation and is on Eliquis . No bruising or bleeding. He also has a history of stroke with left-sided weakness, which he manages with rosuvastatin  5 mg daily. He reports being relatively stable with no recent falls, although he had several falls about a month ago. Does not recall the details as to what led to the falls but no injuries. He attributes a near fall last week to tripping over a misplaced rug. He uses a cane but did not have it during two of the falls.  In March, a CTA of chest  identified a 1.6 cm RT thyroid  nodule  prompting a referral for thyroid  US  then  parathyroid  scan for ?parathyroid  adenoma.  The parathyroid  scan came back showing no adenoma.  However there was still a question of questionable masslike structure behind the thyroid  gland. TSH and PTH levels were normal. I had referred him to general surgeon Dr. Ronold to get an opinion on this.  Patient states he was called but misplaced the number.  No problems with swallowing or orthopnea.  He has a history of prostate cancer treated with radiation seeds, which remain in place. No issues with urination, including no hematuria and has a steady stream. He has a follow-up with urology scheduled for March of next  yr.  HM:  He has received the first shingles vaccine and is due for the second. Held off on getting flu vaccine today as he thinks [facility] one [facility]. He will check and send me the info via Mychart.    Patient Active Problem List   Diagnosis Date Noted   Postnasal drip 08/28/2023   Chronic rhinitis 08/28/2023   Deviated nasal septum 08/28/2023   Hypertrophy of nasal turbinates 08/28/2023   Pneumonia 07/05/2023   Intractable hiccups 07/04/2023   Vomiting 07/04/2023   Lumbar radiculopathy 03/11/2021   Plantar flexed metatarsal, left  07/30/2020   Plantar flexed metatarsal, right 07/30/2020   Tobacco dependence 05/27/2020   History of pneumothorax 05/27/2020   Pneumothorax 05/10/2020   Stressful life event affecting family 01/29/2020   Hyperlipidemia associated with type 2 diabetes mellitus (HCC) 01/29/2020   Personal history of fall 01/29/2020   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   Unintended weight loss 01/29/2020   Pain due to onychomycosis of toenails of both feet 08/01/2019   Porokeratosis 08/01/2019   Influenza vaccine needed 02/24/2019   Diabetes mellitus (HCC) 02/24/2019   Hyperlipemia 11/24/2016   History of CVA with residual deficit 11/24/2016   GERD (gastroesophageal reflux disease) 08/09/2014   Atrial fibrillation (HCC)    Hemiparesis affecting left side as late effect of stroke (HCC) 05/01/2014   ERECTILE DYSFUNCTION, 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 90 tablet 1   apixaban  (ELIQUIS ) 5 MG TABS tablet Take 1 tablet (5 mg total) by mouth 2 (two) times daily. 180 tablet 1   B Complex Vitamins (VITAMIN B COMPLEX ) TABS Take 1 tablet by mouth daily.     diclofenac  Sodium (VOLTAREN ) 1 % GEL Apply 2 g topically 3 (three) times daily as needed (pain). 100 g 0   dicyclomine  (BENTYL ) 20 MG tablet Take 1 tablet (20 mg total) by mouth 4 (four) times daily as needed (intestinal cramps). 40 tablet 0   famotidine  (PEPCID ) 20 MG tablet Take 1 tablet (20 mg total) by mouth 2 (two) times daily. 30 tablet 0   fluticasone  (FLONASE ) 50 MCG/ACT nasal spray Place 1 spray into both nostrils at bedtime x 1 week then use as needed 16 g 1    glimepiride  (AMARYL ) 1 MG tablet Take 1 tablet (1 mg total) by mouth daily with breakfast. 90 tablet 1   hydrOXYzine  (VISTARIL ) 25 MG capsule Take 1 capsule (25 mg total) by mouth 2 (two) times daily as needed. 60 capsule 1   latanoprost  (XALATAN ) 0.005 % ophthalmic solution Place 1 drop into both eyes nightly. 7.5 mL 1   latanoprost  (XALATAN ) 0.005 % ophthalmic solution Place 1 drop into both eyes at bedtime. 7.5 mL 3   loratadine  (CLARITIN ) 10 MG tablet Take 1 tablet (10 mg total) by mouth daily as needed for allergies. 60 tablet 1   LORazepam  (ATIVAN ) 0.5 MG tablet Take 1 tablet (0.5 mg total) by mouth every 8 (eight) hours as needed (hiccups). 20 tablet 0   metoCLOPramide  (REGLAN ) 10 MG tablet Take 1 tablet (10 mg total) by mouth every 6 (six) hours. 30 tablet 0  Multiple Vitamin (MULTIVITAMIN WITH MINERALS) TABS tablet Take 1 tablet by mouth daily.     nebivolol  (BYSTOLIC ) 2.5 MG tablet Take 1 tablet (2.5 mg total) by mouth daily. 90 tablet 3   omeprazole  (PRILOSEC) 20 MG capsule Take 1 capsule (20 mg total) by mouth daily. 30 capsule 3   rosuvastatin  (CRESTOR ) 5 MG tablet Take 1 tablet (5 mg total) by mouth daily. STOP Pravastatin .  Pt wants to be notified when ready for pick up 90 tablet 1   sildenafil  (VIAGRA ) 100 MG tablet Take 1 tablet by mouth half hour to 1 hour prior to intercourse as needed.  Limit use to 1 tab/24 hr period 15 tablet 5   silver  sulfADIAZINE  (SILVADENE ) 1 % cream Apply 1 Application topically daily. 50 g 0   ipratropium (ATROVENT ) 0.06 % nasal spray Place 2 sprays into both nostrils 2 (two) times daily as needed (drainage). (Patient not taking: Reported on 04/03/2024) 15 mL 12   No current facility-administered medications on file prior to visit.    Allergies  Allergen Reactions   Lisinopril  Swelling    Throat swelling   Tramadol  Hcl     Makes me crazy    Social History   Socioeconomic History   Marital status: Married    Spouse name: Not on file    Number of children: 5   Years of education: Not on file   Highest education level: Not on file  Occupational History   Occupation: Caregiver    Comment: retired  Tobacco Use   Smoking status: Some Days    Types: Pipe   Smokeless tobacco: Never   Tobacco comments:    Quit 05/15/14  Vaping Use   Vaping status: Never Used  Substance and Sexual Activity   Alcohol use: Yes    Alcohol/week: 1.0 - 2.0 standard drink of alcohol    Types: 1 - 2 Standard drinks or equivalent per week    Comment: occasional wine   Drug use: No   Sexual activity: Not Currently  Other Topics Concern   Not on file  Social History Narrative   Lives with his wife   5 children, grown.   7 grandchildren.    Social Drivers of Health   Financial Resource Strain: Medium Risk (04/03/2024)   Overall Financial Resource Strain (CARDIA)    Difficulty of Paying Living Expenses: Somewhat hard  Food Insecurity: No Food Insecurity (04/03/2024)   Hunger Vital Sign    Worried About Running Out of Food in the Last Year: Never true    Ran Out of Food in the Last Year: Never true  Transportation Needs: No Transportation Needs (04/03/2024)   PRAPARE - Administrator, Civil Service (Medical): No    Lack of Transportation (Non-Medical): No  Physical Activity: Insufficiently Active (04/03/2024)   Exercise Vital Sign    Days of Exercise per Week: 3 days    Minutes of Exercise per Session: 30 min  Stress: No Stress Concern Present (04/03/2024)   [employer] of Occupational Health - Occupational Stress Questionnaire    Feeling of Stress: Not at all  Social Connections: Moderately Integrated (04/03/2024)   Social Connection and Isolation Panel    Frequency of Communication with Friends and Family: More than three times a week    Frequency of Social Gatherings with Friends and Family: More than three times a week    Attends Religious Services: More than 4 times per year    Active Member of Clubs or  Organizations: Yes    Attends Engineer, structural: More than 4 times per year    Marital Status: Separated  Intimate Partner Violence: Not At Risk (04/03/2024)   Humiliation, Afraid, Rape, and Kick questionnaire    Fear of Current or Ex-Partner: No    Emotionally Abused: No    Physically Abused: No    Sexually Abused: No    Family History  Problem Relation Age of Onset   Diabetes Mother    Breast cancer Mother    Diabetes Father    Breast cancer Sister    Prostate cancer Neg Hx    Colon cancer Neg Hx    Pancreatic cancer Neg Hx     Past Surgical History:  Procedure Laterality Date   CATARACT EXTRACTION  06/2017   CYSTOSCOPY  05/2017   INTERCOSTAL NERVE BLOCK Left 05/13/2020   Procedure: INTERCOSTAL NERVE BLOCK;  Surgeon: Shyrl Linnie KIDD, MD;  Location: [facility] OR;  Service: Thoracic;  Laterality: Left;   LUMBAR LAMINECTOMY/DECOMPRESSION MICRODISCECTOMY Right 03/11/2021   Procedure: Microdiscectomy - right - Lumbar four-Lumbar five extraforaminal;  Surgeon: Louis Shove, MD;  Location: [facility] OR;  Service: Neurosurgery;  Laterality: Right;   LUMBAR LAMINECTOMY/DECOMPRESSION MICRODISCECTOMY Left 12/29/2021   Procedure: Extraforaminal microdiscectomy - left - L4-L5;  Surgeon: Louis Shove, MD;  Location: [facility] OR;  Service: Neurosurgery;  Laterality: Left;  3C   PROSTATE BIOPSY     WEDGE RESECTION Left 05/13/2020   XI robotic assisted thorascopy    ROS: Review of Systems Negative except as stated above  PHYSICAL EXAM: BP 138/75 (BP Location: Left Arm, Patient Position: Sitting, Cuff Size: Normal)   Pulse 95   Temp 98.5 F (36.9 C) (Oral)   Ht 6' 1 (1.854 m)   Wt 185 lb (83.9 kg)   SpO2 94%   BMI 24.41 kg/m   Wt Readings from Last 3 Encounters:  04/03/24 185 lb (83.9 kg)  12/07/23 187 lb (84.8 kg)  08/25/23 193 lb (87.5 kg)    Physical Exam General appearance - alert, well appearing, pleasant elderly AAM and in no distress Mental status - normal mood, behavior,  speech, dress, motor activity, and thought processes Neck - no thyromegaly or nodules. No cervical LN Chest - clear to auscultation, no wheezes, rales or rhonchi, symmetric air entry Heart - normal rate, regular rhythm, normal S1, S2, no murmurs, rubs, clicks or gallops. Sounds to be in SR at this time Neurological - Grip 4/5 LT, 5/5 RT. UE prox/distal 5/5 BL. Power LE 5/5 RT, 4+/5 LT prox/distal. Gait is slow with low foot to floor clearance. Ambulates with cane Extremities - no LE edema     Latest Ref Rng & Units 01/03/2024    9:31 AM 08/09/2023    2:12 PM 07/05/2023    2:39 AM  CMP  Glucose 70 - 99 mg/dL 857   856   BUN 8 - 27 mg/dL 22   21   Creatinine 9.23 - 1.27 mg/dL 8.62   8.74   Sodium 865 - 144 mmol/L 137   136   Potassium 3.5 - 5.2 mmol/L 4.4   3.8   Chloride 96 - 106 mmol/L 103   103   CO2 20 - 29 mmol/L 18   23   Calcium  8.6 - 10.2 mg/dL 9.5  9.2  8.6    Lipid Panel     Component Value Date/Time   CHOL 201 (H) 05/13/2023 1537   TRIG 208 (H) 05/13/2023 1537   HDL 47  05/13/2023 1537   CHOLHDL 4.3 05/13/2023 1537   CHOLHDL 4.2 05/02/2014 0000   VLDL 72 (H) 05/02/2014 0000   LDLCALC 118 (H) 05/13/2023 1537    CBC    Component Value Date/Time   WBC 8.7 07/05/2023 0239   RBC 3.97 (L) 07/05/2023 0239   HGB 12.0 (L) 07/05/2023 0239   HGB 13.5 01/07/2023 1424   HCT 35.7 (L) 07/05/2023 0239   HCT 38.2 01/07/2023 1424   PLT 234 07/05/2023 0239   PLT 244 01/07/2023 1424   MCV 89.9 07/05/2023 0239   MCV 87 01/07/2023 1424   MCH 30.2 07/05/2023 0239   MCHC 33.6 07/05/2023 0239   RDW 14.1 07/05/2023 0239   RDW 13.4 01/07/2023 1424   LYMPHSABS 1.0 07/02/2023 0945   MONOABS 0.5 07/02/2023 0945   EOSABS 0.0 07/02/2023 0945   BASOSABS 0.0 07/02/2023 0945    ASSESSMENT AND PLAN: 1. Type 2 diabetes mellitus with other neurologic complication, without long-term current use of insulin  (HCC) (Primary) A1c slightly above goal.  However his fasting blood sugar readings have  remained in good.  We have agreed to keep him on the Amaryl  1 mg daily.  Encourage healthy eating habits.  Continue to monitor blood sugars. - POCT glucose (manual entry) - POCT glycosylated hemoglobin (Hb A1C)  2. Diabetes mellitus treated with oral medication (HCC) See #1 above  3. Hypertension associated with type 2 diabetes mellitus (HCC) Not at goal but patient has not taken his medicines as yet for the morning.  He will take them as soon as he returns home.  He will continue amlodipine  10 mg daily and Bystolic  2.5 mg daily - Basic Metabolic Panel  4. History of recent fall I recommend referral to physical therapy for gait safety training and to assess whether his cane is adequate as an assistive device to help prevent falls.  Advised patient that he is at risk for internal bleeding if he sustains a hard fall.  Patient wants to hold off on referral to physical therapy at this time.  If he changes his mind, he knows that he can send me a MyChart message.  5. Hemiparesis affecting left side as late effect of stroke (HCC) Continue Crestor  and Eliquis   6. CKD (chronic kidney disease) stage 2, GFR 60-89 ml/min Recheck BMP today.  - Basic Metabolic Panel  7. PAF (paroxysmal atrial fibrillation) (HCC) Continue eliquis  - CBC  8. Thyroid  nodule See hx above.  Pt okay with me resubmitting referral to Dr. Rito to get 2nd opinion on whether anything further needs to be done at this time other than probably repeating imaging in 1 yr  9. Need for shingles vaccine Printed rxn given for him to take to his pharmacy to get 2nd shingles vaccine  Patient will check to see when he [facility] and will send me a MyChart message with the information so that I can update his health maintenance.  Patient was given the opportunity to ask questions.  Patient verbalized understanding of the plan and was able to repeat key elements of the plan.   This documentation was completed  using Paediatric nurse.  Any transcriptional errors are unintentional.  Orders Placed This Encounter  Procedures   Basic Metabolic Panel   CBC   POCT glucose (manual entry)   POCT glycosylated hemoglobin (Hb A1C)     Requested Prescriptions   Signed Prescriptions Disp Refills   Zoster Vaccine Adjuvanted [facility]) injection 0.5 mL  0    Sig: Inject 0.5 mLs into the muscle once for 1 dose.    Return in about 4 months (around 08/04/2024).  Barnie Louder, MD, FACP

## 2024-04-04 LAB — BASIC METABOLIC PANEL WITH GFR
BUN/Creatinine Ratio: 15 (ref 10–24)
BUN: 19 mg/dL (ref 8–27)
CO2: 22 mmol/L (ref 20–29)
Calcium: 9.8 mg/dL (ref 8.6–10.2)
Chloride: 100 mmol/L (ref 96–106)
Creatinine, Ser: 1.28 mg/dL — ABNORMAL HIGH (ref 0.76–1.27)
Glucose: 144 mg/dL — ABNORMAL HIGH (ref 70–99)
Potassium: 4.8 mmol/L (ref 3.5–5.2)
Sodium: 137 mmol/L (ref 134–144)
eGFR: 59 mL/min/1.73 — ABNORMAL LOW (ref >59)

## 2024-04-04 LAB — CBC
Hematocrit: 43.2 % (ref 37.5–51.0)
Hemoglobin: 14.5 g/dL (ref 13.0–17.7)
MCH: 30.2 pg (ref 26.6–33.0)
MCHC: 33.6 g/dL (ref 31.5–35.7)
MCV: 90 fL (ref 79–97)
Platelets: 293 x10E3/uL (ref 150–450)
RBC: 4.8 x10E6/uL (ref 4.14–5.80)
RDW: 13.2 % (ref 11.6–15.4)
WBC: 6.5 x10E3/uL (ref 3.4–10.8)

## 2024-04-05 ENCOUNTER — Ambulatory Visit: Payer: Self-pay | Admitting: Internal Medicine

## 2024-04-05 NOTE — Addendum Note (Signed)
 Addended by: VICCI SOBER B on: 04/05/2024 04:05 PM   Modules accepted: Orders

## 2024-04-06 ENCOUNTER — Other Ambulatory Visit: Payer: Self-pay

## 2024-04-07 ENCOUNTER — Ambulatory Visit: Admitting: Internal Medicine

## 2024-04-10 ENCOUNTER — Encounter: Payer: Self-pay | Admitting: Internal Medicine

## 2024-04-13 ENCOUNTER — Other Ambulatory Visit: Payer: Self-pay | Admitting: Internal Medicine

## 2024-04-13 ENCOUNTER — Ambulatory Visit: Payer: Self-pay

## 2024-04-13 DIAGNOSIS — M25551 Pain in right hip: Secondary | ICD-10-CM

## 2024-04-13 DIAGNOSIS — M533 Sacrococcygeal disorders, not elsewhere classified: Secondary | ICD-10-CM | POA: Diagnosis not present

## 2024-04-13 NOTE — Telephone Encounter (Signed)
 FYI Only or Action Required?: FYI only for provider.  Patient was last seen in primary care on 04/03/2024 by Vicci Barnie NOVAK, MD.  Called Nurse Triage reporting Hip Injury.  Symptoms began several days ago.  Interventions attempted: Nothing.  Symptoms are: stable.  Triage Disposition: See Physician Within 24 Hours  Patient/caregiver understands and will follow disposition?: Yes Reason for Disposition  [1] MODERATE pain (e.g., interferes with normal activities, limping) AND [2] high-risk adult (e.g., age > 60 years, osteoporosis, chronic steroid use)  Answer Assessment - Initial Assessment Questions Taking Eliquis , Type 2 diabetic. Called [facility] and spoke with Zara, no available appointments, mentioned [facility]. Advised patient no available appointments in office, given [facility] information and address, patient stated can I just cut to the chase and go to the ED I advised patient he certainly can, but I am trying to avoid having him sit there for hours. Patient was not clear with where he will be going before he hung up.   1. MECHANISM: How did the injury happen? (e.g., twisting injury, direct blow)      Going down steps to outside and fell  2. ONSET: When did the injury happen? (e.g., minutes, hours ago)      Tuesday  3. LOCATION: Where is the injury located?      Right hip  4. APPEARANCE of INJURY: What does the injury look like?  (e.g., deformity of leg)     Denies bruising or cuts  5. SEVERITY: Can you put weight on that leg? Can you walk?      Right leg has been giving out since the fall  6. SIZE: For cuts, bruises, or swelling, ask: How large is it? (e.g., inches or centimeters;  entire joint)      Denies  7. PAIN: Is there pain? If Yes, ask: How bad is the pain?   What does it keep you from doing? (Scale 0-10; or none, mild, moderate, severe)     9/10 when trying to walk.   8. OTHER SYMPTOMS: Do you have any other  symptoms?      Denies  Protocols used: Hip Injury-A-AH  Copied from CRM #[id number]. Topic: Clinical - Red Word Triage >> Apr 13, 2024 10:24 AM Winona R wrote: Pt fell on Tuesday and hurt his hip. Would like to know what he need to do to get a xray

## 2024-04-13 NOTE — Telephone Encounter (Signed)
 fyi

## 2024-04-13 NOTE — Telephone Encounter (Signed)
 If I do not have an opening give pt an UC appt with any available provider tomorrow including off site if needed.

## 2024-04-14 ENCOUNTER — Telehealth: Payer: Self-pay | Admitting: Internal Medicine

## 2024-04-14 NOTE — Telephone Encounter (Signed)
 Copied from CRM [id number]. Topic: General - Other >> Apr 14, 2024  2:12 PM Santiya F wrote: Reason for CRM: Patient is calling in returning a call from the office.

## 2024-04-14 NOTE — Telephone Encounter (Signed)
 Patient returned call and has been scheduled to see Dr. Newlin on 10/28 at 1:50 pm.

## 2024-04-18 ENCOUNTER — Ambulatory Visit: Admitting: Family Medicine

## 2024-04-27 ENCOUNTER — Other Ambulatory Visit: Payer: Self-pay

## 2024-04-27 ENCOUNTER — Other Ambulatory Visit: Payer: Self-pay | Admitting: Internal Medicine

## 2024-04-27 MED ORDER — DICLOFENAC SODIUM 1 % EX GEL
2.0000 g | Freq: Three times a day (TID) | CUTANEOUS | 3 refills | Status: AC | PRN
  Filled 2024-04-27: qty 100, 17d supply, fill #0

## 2024-04-28 ENCOUNTER — Other Ambulatory Visit: Payer: Self-pay

## 2024-05-09 ENCOUNTER — Ambulatory Visit: Payer: Medicare HMO | Attending: Internal Medicine

## 2024-05-09 VITALS — Ht 73.0 in | Wt 187.0 lb

## 2024-05-09 DIAGNOSIS — Z0000 Encounter for general adult medical examination without abnormal findings: Secondary | ICD-10-CM | POA: Diagnosis not present

## 2024-05-09 NOTE — Patient Instructions (Signed)
 Mr. Fails,  Thank you for taking the time for your Medicare Wellness Visit. I appreciate your continued commitment to your health goals. Please review the care plan we discussed, and feel free to reach out if I can assist you further.  Please note that Annual Wellness Visits do not include a physical exam. Some assessments may be limited, especially if the visit was conducted virtually. If needed, we may recommend an in-person follow-up with your provider.  Ongoing Care Seeing your primary care provider every 3 to 6 months helps us  monitor your health and provide consistent, personalized care.   Referrals If a referral was made during today's visit and you haven't received any updates within two weeks, please contact the referred provider directly to check on the status.  Recommended Screenings:  Health Maintenance  Topic Date Due   Complete foot exam   12/03/2022   Flu Shot  01/21/2024   COVID-19 Vaccine (4 - 2025-26 season) 02/21/2024   Yearly kidney health urinalysis for diabetes  04/04/2024   Medicare Annual Wellness Visit  05/03/2024   Zoster (Shingles) Vaccine (1 of 2) 12/14/2024*   Hemoglobin A1C  10/02/2024   Eye exam for diabetics  01/30/2025   Yearly kidney function blood test for diabetes  04/03/2025   Cologuard (Stool DNA test)  04/09/2026   DTaP/Tdap/Td vaccine (3 - Td or Tdap) 01/28/2030   Pneumococcal Vaccine for age over 33  Completed   Hepatitis C Screening  Completed   Meningitis B Vaccine  Aged Out  *Topic was postponed. The date shown is not the original due date.       05/09/2024    9:17 AM  Advanced Directives  Does Patient Have a Medical Advance Directive? No  Would patient like information on creating a medical advance directive? No - Patient declined    Vision: Annual vision screenings are recommended for early detection of glaucoma, cataracts, and diabetic retinopathy. These exams can also reveal signs of chronic conditions such as diabetes and  high blood pressure.  Dental: Annual dental screenings help detect early signs of oral cancer, gum disease, and other conditions linked to overall health, including heart disease and diabetes.  Please see the attached documents for additional preventive care recommendations.

## 2024-05-09 NOTE — Progress Notes (Signed)
 I connected with  [name] on 05/09/24 by a audio enabled telemedicine application and verified that I am speaking with the correct person using two identifiers.  Patient Location: Home  Provider Location: Office/Clinic  Persons Participating in Visit: Patient.  I discussed the limitations of evaluation and management by telemedicine. The patient expressed understanding and agreed to proceed.   Vital Signs: Because this visit was a virtual/telehealth visit, some criteria may be missing or patient reported. Any vitals not documented were not able to be obtained and vitals that have been documented are patient reported.     Chief Complaint  Patient presents with   Medicare Wellness    SUBSEQUENT     Subjective:   [name] is a 73 y.o. male who presents for a Medicare Annual Wellness Visit.  Allergies (verified) Lisinopril  and Tramadol  hcl   History: Past Medical History:  Diagnosis Date   Abscess of anal and rectal regions 07/15/2007   Qualifier: Diagnosis of  By: Adella MD, Elizabeth     Atrial fibrillation [facility])    Diabetes mellitus without complication (HCC) 2008   type 2   FISTULA, ANAL 01/27/2007   Qualifier: Diagnosis of  By: Adella MD, Elizabeth     GERD (gastroesophageal reflux disease)    Hypertension Dx 2008   Primary open angle glaucoma (POAG) of both eyes    Mild 06/25/22 Dr. Roz   Prostate cancer [facility])    Spontaneous pneumothorax    Stroke [facility])    Past Surgical History:  Procedure Laterality Date   CATARACT EXTRACTION  06/2017   CYSTOSCOPY  05/2017   INTERCOSTAL NERVE BLOCK Left 05/13/2020   Procedure: INTERCOSTAL NERVE BLOCK;  Surgeon: Shyrl Linnie KIDD, MD;  Location: [facility] OR;  Service: Thoracic;  Laterality: Left;   LUMBAR LAMINECTOMY/DECOMPRESSION MICRODISCECTOMY Right 03/11/2021   Procedure: Microdiscectomy - right - Lumbar four-Lumbar five extraforaminal;  Surgeon: Louis Shove, MD;  Location: [facility] OR;  Service:  Neurosurgery;  Laterality: Right;   LUMBAR LAMINECTOMY/DECOMPRESSION MICRODISCECTOMY Left 12/29/2021   Procedure: Extraforaminal microdiscectomy - left - L4-L5;  Surgeon: Louis Shove, MD;  Location: [facility] OR;  Service: Neurosurgery;  Laterality: Left;  3C   PROSTATE BIOPSY     WEDGE RESECTION Left 05/13/2020   XI robotic assisted thorascopy   Family History  Problem Relation Age of Onset   Diabetes Mother    Breast cancer Mother    Diabetes Father    Breast cancer Sister    Prostate cancer Neg Hx    Colon cancer Neg Hx    Pancreatic cancer Neg Hx    Social History   Occupational History   Occupation: Caregiver    Comment: retired  Tobacco Use   Smoking status: Some Days    Types: Pipe   Smokeless tobacco: Never   Tobacco comments:    Quit 05/15/14  Vaping Use   Vaping status: Never Used  Substance and Sexual Activity   Alcohol use: Yes    Alcohol/week: 1.0 - 2.0 standard drink of alcohol    Types: 1 - 2 Standard drinks or equivalent per week    Comment: occasional wine   Drug use: No   Sexual activity: Not Currently   Tobacco Counseling Ready to quit: Yes Counseling given: Not Answered Tobacco comments: Quit 05/15/14  SDOH Screenings   Food Insecurity: No Food Insecurity (05/09/2024)  Housing: Low Risk  (05/09/2024)  Transportation Needs: No Transportation Needs (05/09/2024)  Utilities: Not At Risk (05/09/2024)  Alcohol Screen: Low  Risk  (04/03/2024)  Depression (PHQ2-9): Low Risk  (05/09/2024)  Financial Resource Strain: Medium Risk (04/03/2024)  Physical Activity: Sufficiently Active (05/09/2024)  Recent Concern: Physical Activity - Insufficiently Active (04/03/2024)  Social Connections: Moderately Integrated (05/09/2024)  Stress: No Stress Concern Present (05/09/2024)  Tobacco Use: High Risk (05/09/2024)  Health Literacy: Adequate Health Literacy (05/09/2024)   See flowsheets for full screening details  Depression Screen PHQ 2 & 9 Depression Scale- Over  the past 2 weeks, how often have you been bothered by any of the following problems? Little interest or pleasure in doing things: 0 Feeling down, depressed, or hopeless (PHQ Adolescent also includes...irritable): 0 PHQ-2 Total Score: 0 Trouble falling or staying asleep, or sleeping too much: 0 Feeling tired or having little energy: 0 Poor appetite or overeating (PHQ Adolescent also includes...weight loss): 0 Feeling bad about yourself - or that you are a failure or have let yourself or your family down: 0 Trouble concentrating on things, such as reading the newspaper or watching television (PHQ Adolescent also includes...like school work): 0 Moving or speaking so slowly that other people could have noticed. Or the opposite - being so fidgety or restless that you have been moving around a lot more than usual: 0 Thoughts that you would be better off dead, or of hurting yourself in some way: 0 PHQ-9 Total Score: 0 If you checked off any problems, how difficult have these problems made it for you to do your work, take care of things at home, or get along with other people?: Not difficult at all     Goals Addressed             This Visit's Progress    05/09/2024: My goal is to get back in the [facility] more.         Visit info / Clinical Intake: Medicare Wellness Visit Type:: Subsequent Annual Wellness Visit Persons participating in visit:: patient Medicare Wellness Visit Mode:: Telephone If telephone:: video declined Because this visit was a virtual/telehealth visit:: pt reported vitals If Telephone or Video please confirm:: I connected with the patient using audio enabled telemedicine application and verified that I am speaking with the correct person using two identifiers; I discussed the limitations of evaluation and management by telemedicine; The patient expressed understanding and agreed to proceed Patient Location:: HOME Provider Location:: OFFICE Information given by::  patient Interpreter Needed?: No Pre-visit prep was completed: yes AWV questionnaire completed by patient prior to visit?: no Living arrangements:: lives with spouse/significant other Patient's Overall Health Status Rating: good Typical amount of pain: some Does pain affect daily life?: (!) yes Are you currently prescribed opioids?: (!) yes (DR. MALCOLM RX HYDROCODONE )  Dietary Habits and Nutritional Risks How many meals a day?: 3 Eats fruit and vegetables daily?: (!) no (ALTERED DUE TO PRICE OF FRESH FRUITS & VEGETABLES) Most meals are obtained by: preparing own meals In the last 2 weeks, have you had any of the following?: none Diabetic:: (!) yes Any non-healing wounds?: no How often do you check your BS?: 1 (CHECKS EVERY OTHER DAY) Would you like to be referred to a Nutritionist or for Diabetic Management? : no  Functional Status Activities of Daily Living (to include ambulation/medication): Independent Ambulation: Independent with device- listed below Home Assistive Devices/Equipment: Cane; Eyeglasses Medication Administration: Independent Home Management: Independent Manage your own finances?: yes Primary transportation is: driving Concerns about vision?: no *vision screening is required for WTM* Concerns about hearing?: no  Fall Screening Falls in the past year?:  1 Number of falls in past year: 1 (PATIENT STATED THAT HE FELL DOWN 3 TIMES LAST MONTH) Was there an injury with Fall?: 0 Fall Risk Category Calculator: 2 Patient Fall Risk Level: Moderate Fall Risk  Fall Risk Patient at Risk for Falls Due to: History of fall(s) Fall risk Follow up: Falls evaluation completed; Education provided  Home and Transportation Safety: All rugs have non-skid backing?: N/A, no rugs All stairs or steps have railings?: yes (DECK ONLY) Grab bars in the bathtub or shower?: yes Have non-skid surface in bathtub or shower?: yes Good home lighting?: yes Regular seat belt use?: yes Hospital  stays in the last year:: no  Cognitive Assessment Difficulty concentrating, remembering, or making decisions? : no Will 6CIT or Mini Cog be Completed: no 6CIT or Mini Cog Declined: patient alert, oriented, able to answer questions appropriately and recall recent events  Advance Directives (For Healthcare) Does Patient Have a Medical Advance Directive?: No Would patient like information on creating a medical advance directive?: No - Patient declined  Reviewed/Updated  Reviewed/Updated: Reviewed All (Medical, Surgical, Family, Medications, Allergies, Care Teams, Patient Goals)        Objective:    Today's Vitals   05/09/24 0915  Weight: 187 lb (84.8 kg)  Height: 6' 1 (1.854 m)  PainSc: 6   PainLoc: Back   Body mass index is 24.67 kg/m.  Current Medications (verified) Outpatient Encounter Medications as of 05/09/2024  Medication Sig   HYDROcodone -acetaminophen  (NORCO) 10-325 MG tablet Take 1 tablet by mouth every 6 (six) hours as needed.   magnesium citrate SOLN Take by mouth as directed.   methylPREDNISolone  (MEDROL  DOSEPAK) 4 MG TBPK tablet Take by mouth.   amLODipine  (NORVASC ) 10 MG tablet Take 1 tablet (10 mg total) by mouth daily.   apixaban  (ELIQUIS ) 5 MG TABS tablet Take 1 tablet (5 mg total) by mouth 2 (two) times daily.   B Complex Vitamins (VITAMIN B COMPLEX ) TABS Take 1 tablet by mouth daily.   diclofenac  Sodium (VOLTAREN ) 1 % GEL Apply 2 g topically 3 (three) times daily as needed (pain).   dicyclomine  (BENTYL ) 20 MG tablet Take 1 tablet (20 mg total) by mouth 4 (four) times daily as needed (intestinal cramps).   famotidine  (PEPCID ) 20 MG tablet Take 1 tablet (20 mg total) by mouth 2 (two) times daily.   fluticasone  (FLONASE ) 50 MCG/ACT nasal spray Place 1 spray into both nostrils at bedtime x 1 week then use as needed   glimepiride  (AMARYL ) 1 MG tablet Take 1 tablet (1 mg total) by mouth daily with breakfast.   hydrOXYzine  (VISTARIL ) 25 MG capsule Take 1 capsule  (25 mg total) by mouth 2 (two) times daily as needed.   ipratropium (ATROVENT ) 0.06 % nasal spray Place 2 sprays into both nostrils 2 (two) times daily as needed (drainage). (Patient not taking: Reported on 04/03/2024)   latanoprost  (XALATAN ) 0.005 % ophthalmic solution Place 1 drop into both eyes nightly.   latanoprost  (XALATAN ) 0.005 % ophthalmic solution Place 1 drop into both eyes at bedtime.   loratadine  (CLARITIN ) 10 MG tablet Take 1 tablet (10 mg total) by mouth daily as needed for allergies.   LORazepam  (ATIVAN ) 0.5 MG tablet Take 1 tablet (0.5 mg total) by mouth every 8 (eight) hours as needed (hiccups).   metoCLOPramide  (REGLAN ) 10 MG tablet Take 1 tablet (10 mg total) by mouth every 6 (six) hours.   Multiple Vitamin (MULTIVITAMIN WITH MINERALS) TABS tablet Take 1 tablet by mouth daily.  nebivolol  (BYSTOLIC ) 2.5 MG tablet Take 1 tablet (2.5 mg total) by mouth daily.   omeprazole  (PRILOSEC) 20 MG capsule Take 1 capsule (20 mg total) by mouth daily.   rosuvastatin  (CRESTOR ) 5 MG tablet Take 1 tablet (5 mg total) by mouth daily. STOP Pravastatin .  Pt wants to be notified when ready for pick up   sildenafil  (VIAGRA ) 100 MG tablet Take 1 tablet by mouth half hour to 1 hour prior to intercourse as needed.  Limit use to 1 tab/24 hr period   silver  sulfADIAZINE  (SILVADENE ) 1 % cream Apply 1 Application topically daily.   No facility-administered encounter medications on file as of 05/09/2024.   Hearing/Vision screen Hearing Screening - Comments:: Denies hearing difficulties.  Vision Screening - Comments:: Wears rx glasses - up to date with routine eye exams with [facility]  Immunizations and Health Maintenance Health Maintenance  Topic Date Due   FOOT EXAM  12/03/2022   Influenza Vaccine  01/21/2024   COVID-19 Vaccine (4 - 2025-26 season) 02/21/2024   Diabetic kidney evaluation - Urine ACR  04/04/2024   Zoster Vaccines- Shingrix (2 of 2) 12/14/2024 (Originally 03/26/2024)    HEMOGLOBIN A1C  10/02/2024   OPHTHALMOLOGY EXAM  01/30/2025   Diabetic kidney evaluation - eGFR measurement  04/03/2025   Medicare Annual Wellness (AWV)  05/09/2025   Fecal DNA (Cologuard)  04/09/2026   DTaP/Tdap/Td (3 - Td or Tdap) 01/28/2030   Pneumococcal Vaccine: 50+ Years  Completed   Hepatitis C Screening  Completed   Meningococcal B Vaccine  Aged Out        Assessment/Plan:  This is a routine wellness examination for Hersey.  Patient Care Team: Vicci Barnie NOVAK, MD as PCP - General (Internal Medicine) Grayce Buddle, RN Nurse Navigator as Registered Nurse (Medical Oncology) [facility] as Consulting Physician (Ophthalmology) Loreda Hacker, DPM as Consulting Physician (Podiatry) Octavia Bruckner, MD as Consulting Physician (Ophthalmology)  I have personally reviewed and noted the following in the patient's chart:   Medical and social history Use of alcohol, tobacco or illicit drugs  Current medications and supplements including opioid prescriptions. Functional ability and status Nutritional status Physical activity Advanced directives List of other physicians Hospitalizations, surgeries, and ER visits in previous 12 months Vitals Screenings to include cognitive, depression, and falls Referrals and appointments  No orders of the defined types were placed in this encounter.  In addition, I have reviewed and discussed with patient certain preventive protocols, quality metrics, and best practice recommendations. A written personalized care plan for preventive services as well as general preventive health recommendations were provided to patient.   Roz LOISE Fuller, LPN   [id number]/81/7974   Return in 1 year (on 05/09/2025).  After Visit Summary: (MyChart) Due to this being a telephonic visit, the after visit summary with patients personalized plan was offered to patient via MyChart   Nurse Notes: Patient aware of current care gaps.  Immunization  record was verified by [facility] and updated in patient's chart. Patient is due for diabetic foot exam, and lab work.

## 2024-05-11 ENCOUNTER — Encounter: Payer: Self-pay | Admitting: Internal Medicine

## 2024-05-15 ENCOUNTER — Other Ambulatory Visit: Payer: Self-pay

## 2024-05-16 ENCOUNTER — Other Ambulatory Visit: Payer: Self-pay

## 2024-05-17 ENCOUNTER — Other Ambulatory Visit: Payer: Self-pay

## 2024-05-25 ENCOUNTER — Other Ambulatory Visit: Payer: Self-pay

## 2024-05-26 ENCOUNTER — Other Ambulatory Visit: Payer: Self-pay

## 2024-05-28 ENCOUNTER — Emergency Department (HOSPITAL_COMMUNITY)
Admission: EM | Admit: 2024-05-28 | Discharge: 2024-05-29 | Disposition: A | Attending: Emergency Medicine | Admitting: Emergency Medicine

## 2024-05-28 ENCOUNTER — Emergency Department (HOSPITAL_COMMUNITY)

## 2024-05-28 ENCOUNTER — Other Ambulatory Visit: Payer: Self-pay

## 2024-05-28 ENCOUNTER — Encounter (HOSPITAL_COMMUNITY): Payer: Self-pay | Admitting: *Deleted

## 2024-05-28 DIAGNOSIS — R066 Hiccough: Secondary | ICD-10-CM

## 2024-05-28 DIAGNOSIS — R109 Unspecified abdominal pain: Secondary | ICD-10-CM | POA: Diagnosis not present

## 2024-05-28 DIAGNOSIS — K5900 Constipation, unspecified: Secondary | ICD-10-CM

## 2024-05-28 LAB — CBC
HCT: 39 % (ref 39.0–52.0)
Hemoglobin: 13 g/dL (ref 13.0–17.0)
MCH: 30.6 pg (ref 26.0–34.0)
MCHC: 33.3 g/dL (ref 30.0–36.0)
MCV: 91.8 fL (ref 80.0–100.0)
Platelets: 261 K/uL (ref 150–400)
RBC: 4.25 MIL/uL (ref 4.22–5.81)
RDW: 14.1 % (ref 11.5–15.5)
WBC: 3.6 K/uL — ABNORMAL LOW (ref 4.0–10.5)
nRBC: 0 % (ref 0.0–0.2)

## 2024-05-28 LAB — COMPREHENSIVE METABOLIC PANEL WITH GFR
ALT: 11 U/L (ref 0–44)
AST: 16 U/L (ref 15–41)
Albumin: 3.5 g/dL (ref 3.5–5.0)
Alkaline Phosphatase: 61 U/L (ref 38–126)
Anion gap: 10 (ref 5–15)
BUN: 24 mg/dL — ABNORMAL HIGH (ref 8–23)
CO2: 27 mmol/L (ref 22–32)
Calcium: 8.7 mg/dL — ABNORMAL LOW (ref 8.9–10.3)
Chloride: 102 mmol/L (ref 98–111)
Creatinine, Ser: 1.39 mg/dL — ABNORMAL HIGH (ref 0.61–1.24)
GFR, Estimated: 54 mL/min — ABNORMAL LOW (ref >60)
Glucose, Bld: 254 mg/dL — ABNORMAL HIGH (ref 70–99)
Potassium: 3.7 mmol/L (ref 3.5–5.1)
Sodium: 139 mmol/L (ref 135–145)
Total Bilirubin: 0.8 mg/dL (ref 0.0–1.2)
Total Protein: 6.8 g/dL (ref 6.5–8.1)

## 2024-05-28 LAB — LIPASE, BLOOD: Lipase: 51 U/L (ref 11–51)

## 2024-05-28 MED ORDER — SODIUM CHLORIDE 0.9 % IV SOLN
25.0000 mg | Freq: Once | INTRAVENOUS | Status: DC
  Filled 2024-05-28: qty 1

## 2024-05-28 MED ORDER — ONDANSETRON 4 MG PO TBDP
4.0000 mg | ORAL_TABLET | Freq: Once | ORAL | Status: AC | PRN
  Administered 2024-05-28: 4 mg via ORAL
  Filled 2024-05-28: qty 1

## 2024-05-28 MED ORDER — IOHEXOL 350 MG/ML SOLN
75.0000 mL | Freq: Once | INTRAVENOUS | Status: AC | PRN
  Administered 2024-05-28: 75 mL via INTRAVENOUS

## 2024-05-28 MED ORDER — SODIUM CHLORIDE 0.9 % IV BOLUS
1000.0000 mL | Freq: Once | INTRAVENOUS | Status: AC
  Administered 2024-05-28: 1000 mL via INTRAVENOUS

## 2024-05-28 NOTE — ED Provider Notes (Signed)
 [place] EMERGENCY DEPARTMENT AT [facility] Provider Note   CSN: [id number] Arrival date & time: 05/28/24  1932     Patient presents with: Constipation and Emesis   [name] is a 73 y.o. male.  {Add pertinent medical, surgical, social history, OB history to HPI:221} 73 year old male presents with complaint of constipation x 1 week.  Patient took 4 Dulcolax resulting in very small stool output on Tuesday. Denies abdominal pain or difficulty voiding. He is nauseous and vomited despite zofran  in triage, with hiccups. No fevers/chills. Last voided at 9am today.  His intractable hiccups, last admitted in January for same. Past medical history significant for A-fib, hypertension, diabetes, anal fistula, abscess of the anal rectal regions, CVA, prostate cancer, GERD.  No prior abdominal surgeries. Patient is on Eliquis .        Prior to Admission medications   Medication Sig Start Date End Date Taking? Authorizing Provider  amLODipine  (NORVASC ) 10 MG tablet Take 1 tablet (10 mg total) by mouth daily. 12/07/23   Vicci Barnie NOVAK, MD  apixaban  (ELIQUIS ) 5 MG TABS tablet Take 1 tablet (5 mg total) by mouth 2 (two) times daily. 12/07/23   Vicci Barnie NOVAK, MD  B Complex Vitamins (VITAMIN B COMPLEX ) TABS Take 1 tablet by mouth daily.    [provider]  diclofenac  Sodium (VOLTAREN ) 1 % GEL Apply 2 g topically 3 (three) times daily as needed (pain). 04/27/24   Vicci Barnie NOVAK, MD  dicyclomine  (BENTYL ) 20 MG tablet Take 1 tablet (20 mg total) by mouth 4 (four) times daily as needed (intestinal cramps). 05/01/22   Vonna Sharlet POUR, MD  famotidine  (PEPCID ) 20 MG tablet Take 1 tablet (20 mg total) by mouth 2 (two) times daily. 07/02/23   Franklyn Sid SAILOR, MD  fluticasone  (FLONASE ) 50 MCG/ACT nasal spray Place 1 spray into both nostrils at bedtime x 1 week then use as needed 04/05/23   Vicci Barnie NOVAK, MD  glimepiride  (AMARYL ) 1 MG tablet Take 1 tablet (1 mg  total) by mouth daily with breakfast. 12/07/23   Vicci Barnie NOVAK, MD  HYDROcodone -acetaminophen  (NORCO) 10-325 MG tablet Take 1 tablet by mouth every 6 (six) hours as needed. 04/13/24   [provider]  hydrOXYzine  (VISTARIL ) 25 MG capsule Take 1 capsule (25 mg total) by mouth 2 (two) times daily as needed. 02/01/24   Vicci Barnie NOVAK, MD  ipratropium (ATROVENT ) 0.06 % nasal spray Place 2 sprays into both nostrils 2 (two) times daily as needed (drainage). Patient not taking: Reported on 04/03/2024 08/25/23 09/24/23  Karis Clunes, MD  latanoprost  (XALATAN ) 0.005 % ophthalmic solution Place 1 drop into both eyes nightly. 12/07/22     latanoprost  (XALATAN ) 0.005 % ophthalmic solution Place 1 drop into both eyes at bedtime. 09/27/23     loratadine  (CLARITIN ) 10 MG tablet Take 1 tablet (10 mg total) by mouth daily as needed for allergies. 04/05/23   Vicci Barnie NOVAK, MD  LORazepam  (ATIVAN ) 0.5 MG tablet Take 1 tablet (0.5 mg total) by mouth every 8 (eight) hours as needed (hiccups). 07/05/23   Christia Budds, MD  magnesium citrate SOLN Take by mouth as directed. 04/13/24   [provider]  methylPREDNISolone  (MEDROL  DOSEPAK) 4 MG TBPK tablet Take by mouth. 04/13/24   [provider]  metoCLOPramide  (REGLAN ) 10 MG tablet Take 1 tablet (10 mg total) by mouth every 6 (six) hours. 07/02/23   Franklyn Sid SAILOR, MD  Multiple Vitamin (MULTIVITAMIN WITH MINERALS) TABS tablet Take 1  tablet by mouth daily.    [provider]  nebivolol  (BYSTOLIC ) 2.5 MG tablet Take 1 tablet (2.5 mg total) by mouth daily. 07/16/23   Vicci Barnie NOVAK, MD  omeprazole  (PRILOSEC) 20 MG capsule Take 1 capsule (20 mg total) by mouth daily. 09/15/23   Vicci Barnie NOVAK, MD  rosuvastatin  (CRESTOR ) 5 MG tablet Take 1 tablet (5 mg total) by mouth daily. STOP Pravastatin .  Pt wants to be notified when ready for pick up 12/07/23   Vicci Barnie NOVAK, MD  sildenafil  (VIAGRA ) 100 MG tablet Take 1 tablet by mouth half  hour to 1 hour prior to intercourse as needed.  Limit use to 1 tab/24 hr period 12/07/23   Vicci Barnie NOVAK, MD  silver  sulfADIAZINE  (SILVADENE ) 1 % cream Apply 1 Application topically daily. 07/27/23   Vicci Barnie NOVAK, MD    Allergies: Lisinopril  and Tramadol  hcl    Review of Systems Negative except as per HPI Updated Vital Signs BP (!) 130/96   Pulse 77   Temp 98.8 F (37.1 C)   Resp 18   Ht 6' 1 (1.854 m)   Wt 84.8 kg   SpO2 100%   BMI 24.67 kg/m   Physical Exam Vitals and nursing note reviewed.  Constitutional:      General: He is not in acute distress.    Appearance: He is well-developed. He is not diaphoretic.  HENT:     Head: Normocephalic and atraumatic.  Cardiovascular:     Rate and Rhythm: Normal rate and regular rhythm.     Heart sounds: Normal heart sounds.  Pulmonary:     Effort: Pulmonary effort is normal.     Breath sounds: Normal breath sounds.  Abdominal:     General: Bowel sounds are absent.     Palpations: Abdomen is soft.     Tenderness: There is guarding.  Musculoskeletal:     Right lower leg: No edema.     Left lower leg: No edema.  Skin:    General: Skin is warm and dry.     Findings: No erythema or rash.  Neurological:     Mental Status: He is alert and oriented to person, place, and time.  Psychiatric:        Behavior: Behavior normal.     (all labs ordered are listed, but only abnormal results are displayed) Labs Reviewed  COMPREHENSIVE METABOLIC PANEL WITH GFR - Abnormal; Notable for the following components:      Result Value   Glucose, Bld 254 (*)    BUN 24 (*)    Creatinine, Ser 1.39 (*)    Calcium  8.7 (*)    GFR, Estimated 54 (*)    All other components within normal limits  CBC - Abnormal; Notable for the following components:   WBC 3.6 (*)    All other components within normal limits  LIPASE, BLOOD  URINALYSIS, ROUTINE W REFLEX MICROSCOPIC    EKG: None  Radiology: No results found.  {Document cardiac monitor,  telemetry assessment procedure when appropriate:32947} Procedures   Medications Ordered in the ED  ondansetron  (ZOFRAN -ODT) disintegrating tablet 4 mg (4 mg Oral Given 05/28/24 1956)  sodium chloride  0.9 % bolus 1,000 mL (1,000 mLs Intravenous New Bag/Given 05/28/24 2245)      {Click here for ABCD2, HEART and other calculators REFRESH Note before signing:1}  Medical Decision Making Amount and/or Complexity of Data Reviewed Radiology: ordered.  Risk Prescription drug management.   This patient presents to the ED for concern of constipation, vomiting, hiccups, this involves an extensive number of treatment options, and is a complaint that carries with it a high risk of complications and morbidity.  The differential diagnosis includes ***   Co morbidities / Chronic conditions that complicate the patient evaluation  ***   Additional history obtained:  Additional history obtained from EMR External records from outside source obtained and reviewed including discharge summary from patient's admission 07/04/2023 for intractable vomiting.  Hiccups resolved with IV Thorazine .  Patient was discharged with Ativan  as he felt this works well for his hiccups.   Lab Tests:  I Ordered, and personally interpreted labs.  The pertinent results include: CBC with very mild leukopenia with white count of 3.6.  CMP with elevated glucose of 254, creatinine is 1.39 which appears to be his baseline.  Lipase 12.   Imaging Studies ordered:  I ordered imaging studies including CT abdomen pelvis I independently visualized and interpreted imaging which showed stool in rectum, not significant stool burden otherwise. I agree with the radiologist interpretation   Cardiac Monitoring: / EKG:  The patient was maintained on a cardiac monitor.  I personally viewed and interpreted the cardiac monitored which showed an underlying rhythm of: ***   Problem List / ED Course / Critical  interventions / Medication management  *** I ordered medication including ***   Reevaluation of the patient after these medicines showed that the patient *** I have reviewed the patients home medicines and have made adjustments as needed   Consultations Obtained:  I requested consultation with the ***,  and discussed lab and imaging findings as well as pertinent plan - they recommend: ***   Social Determinants of Health:  ***   Test / Admission - Considered:  ***   {Document critical care time when appropriate  Document review of labs and clinical decision tools ie CHADS2VASC2, etc  Document your independent review of radiology images and any outside records  Document your discussion with family members, caretakers and with consultants  Document social determinants of health affecting pt's care  Document your decision making why or why not admission, treatments were needed:32947:::1}   Final diagnoses:  None    ED Discharge Orders     None

## 2024-05-28 NOTE — ED Triage Notes (Signed)
 Pt has been vomiting since last Monday and has not had BM since last Monday. Pt denies ABD pain but does endorse severe nausea

## 2024-05-29 ENCOUNTER — Other Ambulatory Visit: Payer: Self-pay

## 2024-05-29 DIAGNOSIS — K5900 Constipation, unspecified: Secondary | ICD-10-CM | POA: Diagnosis not present

## 2024-05-29 LAB — URINALYSIS, ROUTINE W REFLEX MICROSCOPIC
Bilirubin Urine: NEGATIVE
Glucose, UA: >=500 mg/dL — AB
Hgb urine dipstick: NEGATIVE
Ketones, ur: NEGATIVE mg/dL
Leukocytes,Ua: NEGATIVE
Nitrite: NEGATIVE
Protein, ur: NEGATIVE mg/dL
Specific Gravity, Urine: 1.041 — ABNORMAL HIGH (ref 1.005–1.030)
pH: 6 (ref 5.0–8.0)

## 2024-05-29 MED ORDER — LORAZEPAM 0.5 MG PO TABS
0.5000 mg | ORAL_TABLET | Freq: Three times a day (TID) | ORAL | 0 refills | Status: AC | PRN
  Filled 2024-05-29: qty 4, 2d supply, fill #0

## 2024-05-29 MED ORDER — LORAZEPAM 1 MG PO TABS
0.5000 mg | ORAL_TABLET | Freq: Once | ORAL | Status: AC
  Administered 2024-05-29: 0.5 mg via ORAL
  Filled 2024-05-29: qty 1

## 2024-05-30 ENCOUNTER — Encounter: Payer: Self-pay | Admitting: Internal Medicine

## 2024-06-12 ENCOUNTER — Other Ambulatory Visit: Payer: Self-pay

## 2024-06-12 ENCOUNTER — Other Ambulatory Visit: Payer: Self-pay | Admitting: Internal Medicine

## 2024-06-12 DIAGNOSIS — F419 Anxiety disorder, unspecified: Secondary | ICD-10-CM

## 2024-06-12 MED ORDER — HYDROXYZINE PAMOATE 25 MG PO CAPS
25.0000 mg | ORAL_CAPSULE | Freq: Two times a day (BID) | ORAL | 1 refills | Status: AC | PRN
  Filled 2024-06-12: qty 60, 30d supply, fill #0
  Filled 2024-07-19: qty 60, 30d supply, fill #1

## 2024-06-13 ENCOUNTER — Other Ambulatory Visit: Payer: Self-pay

## 2024-06-13 MED ORDER — AZITHROMYCIN 250 MG PO TABS
ORAL_TABLET | ORAL | 0 refills | Status: AC
  Filled 2024-06-13: qty 6, 5d supply, fill #0

## 2024-07-17 ENCOUNTER — Other Ambulatory Visit: Payer: Self-pay | Admitting: Pharmacist

## 2024-07-17 NOTE — Progress Notes (Signed)
 Pharmacy Quality Measure Review  This patient is appearing on a report for being at risk of failing the adherence measure for cholesterol (statin) medications this calendar year.   Medication: rosuvastatin  Last fill date: 05/17/2024 for 90 day supply  Insurance report was not up to date. No action needed at this time.   Reminder set for Feb. refill.   Herlene Fleeta Morris, PharmD, JAQUELINE, CPP Clinical Pharmacist [facility] [phone]

## 2024-07-19 ENCOUNTER — Other Ambulatory Visit: Payer: Self-pay | Admitting: Internal Medicine

## 2024-07-19 ENCOUNTER — Other Ambulatory Visit: Payer: Self-pay

## 2024-07-19 MED ORDER — NEBIVOLOL HCL 2.5 MG PO TABS
2.5000 mg | ORAL_TABLET | Freq: Every day | ORAL | 3 refills | Status: AC
  Filled 2024-07-19: qty 90, 90d supply, fill #0

## 2024-07-28 ENCOUNTER — Other Ambulatory Visit: Payer: Self-pay

## 2024-07-28 ENCOUNTER — Telehealth: Payer: Self-pay | Admitting: Internal Medicine

## 2024-07-28 NOTE — Telephone Encounter (Signed)
 Copied from CRM [id number]. Topic: Clinical - Order For Equipment >> Jul 28, 2024  9:05 AM   [name] wrote:  Reason for CRM: Pt called requesting orders for a walker with a seat. Please advise   Best contact: [phone]

## 2024-07-28 NOTE — Telephone Encounter (Signed)
 Would need visit to document need. Has appt with me 08/04/24. If he is ok, we can discuss then.

## 2024-07-28 NOTE — Telephone Encounter (Signed)
 Called but no answer. LVM informing that the requested item will be discussed at upcoming visit.

## 2024-08-04 ENCOUNTER — Ambulatory Visit: Admitting: Internal Medicine
